# Patient Record
Sex: Male | Born: 1955
Health system: Southern US, Community
[De-identification: ages and names within clinical notes are randomized; demographics above are authoritative.]

## PROBLEM LIST (undated history)

## (undated) DIAGNOSIS — F528 Other sexual dysfunction not due to a substance or known physiological condition: Secondary | ICD-10-CM

## (undated) DIAGNOSIS — K449 Diaphragmatic hernia without obstruction or gangrene: Secondary | ICD-10-CM

## (undated) DIAGNOSIS — I509 Heart failure, unspecified: Secondary | ICD-10-CM

## (undated) DIAGNOSIS — I340 Nonrheumatic mitral (valve) insufficiency: Secondary | ICD-10-CM

## (undated) DIAGNOSIS — I499 Cardiac arrhythmia, unspecified: Secondary | ICD-10-CM

## (undated) DIAGNOSIS — C9201 Acute myeloblastic leukemia, in remission: Secondary | ICD-10-CM

## (undated) DIAGNOSIS — I10 Essential (primary) hypertension: Secondary | ICD-10-CM

## (undated) DIAGNOSIS — F32A Depression, unspecified: Secondary | ICD-10-CM

## (undated) DIAGNOSIS — I428 Other cardiomyopathies: Secondary | ICD-10-CM

## (undated) DIAGNOSIS — M1990 Unspecified osteoarthritis, unspecified site: Secondary | ICD-10-CM

## (undated) DIAGNOSIS — E785 Hyperlipidemia, unspecified: Secondary | ICD-10-CM

## (undated) DIAGNOSIS — I2510 Atherosclerotic heart disease of native coronary artery without angina pectoris: Secondary | ICD-10-CM

## (undated) DIAGNOSIS — Z87442 Personal history of urinary calculi: Secondary | ICD-10-CM

## (undated) DIAGNOSIS — Z8371 Family history of colonic polyps: Secondary | ICD-10-CM

## (undated) DIAGNOSIS — K5730 Diverticulosis of large intestine without perforation or abscess without bleeding: Secondary | ICD-10-CM

## (undated) DIAGNOSIS — D469 Myelodysplastic syndrome, unspecified: Secondary | ICD-10-CM

## (undated) DIAGNOSIS — D649 Anemia, unspecified: Secondary | ICD-10-CM

## (undated) DIAGNOSIS — F329 Major depressive disorder, single episode, unspecified: Secondary | ICD-10-CM

## (undated) DIAGNOSIS — K219 Gastro-esophageal reflux disease without esophagitis: Secondary | ICD-10-CM

## (undated) HISTORY — PX: KNEE SURGERY: SHX244

## (undated) HISTORY — DX: Acute myeloblastic leukemia, in remission: C92.01

## (undated) HISTORY — DX: Depression, unspecified: F32.A

## (undated) HISTORY — DX: Unspecified osteoarthritis, unspecified site: M19.90

## (undated) HISTORY — DX: Hyperlipidemia, unspecified: E78.5

## (undated) HISTORY — DX: Other sexual dysfunction not due to a substance or known physiological condition: F52.8

## (undated) HISTORY — DX: Gastro-esophageal reflux disease without esophagitis: K21.9

## (undated) HISTORY — DX: Diverticulosis of large intestine without perforation or abscess without bleeding: K57.30

## (undated) HISTORY — DX: Diaphragmatic hernia without obstruction or gangrene: K44.9

## (undated) HISTORY — PX: SHOULDER SURGERY: SHX246

## (undated) HISTORY — DX: Myelodysplastic syndrome, unspecified: D46.9

## (undated) HISTORY — DX: Anemia, unspecified: D64.9

## (undated) HISTORY — DX: Personal history of urinary calculi: Z87.442

## (undated) HISTORY — DX: Essential (primary) hypertension: I10

## (undated) HISTORY — DX: Major depressive disorder, single episode, unspecified: F32.9

## (undated) HISTORY — DX: Family history of colonic polyps: Z83.71

---

## 1997-08-11 DIAGNOSIS — C9201 Acute myeloblastic leukemia, in remission: Secondary | ICD-10-CM

## 1997-08-11 HISTORY — DX: Acute myeloblastic leukemia, in remission: C92.01

## 1997-12-27 ENCOUNTER — Encounter: Payer: Self-pay | Admitting: Internal Medicine

## 1998-03-16 ENCOUNTER — Encounter: Payer: Self-pay | Admitting: Internal Medicine

## 1998-03-16 ENCOUNTER — Other Ambulatory Visit: Admission: RE | Admit: 1998-03-16 | Discharge: 1998-03-16 | Payer: Self-pay | Admitting: Gastroenterology

## 2000-09-30 ENCOUNTER — Ambulatory Visit (HOSPITAL_COMMUNITY): Admission: RE | Admit: 2000-09-30 | Discharge: 2000-09-30 | Payer: Self-pay | Admitting: Orthopedic Surgery

## 2000-09-30 ENCOUNTER — Encounter: Payer: Self-pay | Admitting: Orthopedic Surgery

## 2003-08-02 ENCOUNTER — Encounter: Payer: Self-pay | Admitting: Internal Medicine

## 2004-07-10 ENCOUNTER — Ambulatory Visit: Payer: Self-pay | Admitting: Internal Medicine

## 2004-08-01 ENCOUNTER — Ambulatory Visit: Payer: Self-pay | Admitting: Internal Medicine

## 2004-11-25 ENCOUNTER — Ambulatory Visit: Payer: Self-pay | Admitting: Internal Medicine

## 2005-01-17 ENCOUNTER — Encounter: Admission: RE | Admit: 2005-01-17 | Discharge: 2005-01-17 | Payer: Self-pay | Admitting: Orthopedic Surgery

## 2005-01-23 ENCOUNTER — Ambulatory Visit: Payer: Self-pay | Admitting: Internal Medicine

## 2005-01-30 ENCOUNTER — Ambulatory Visit: Payer: Self-pay | Admitting: Internal Medicine

## 2005-06-06 ENCOUNTER — Ambulatory Visit: Payer: Self-pay | Admitting: Internal Medicine

## 2005-07-28 ENCOUNTER — Ambulatory Visit: Payer: Self-pay | Admitting: Internal Medicine

## 2005-08-06 ENCOUNTER — Ambulatory Visit: Payer: Self-pay | Admitting: Internal Medicine

## 2005-08-25 ENCOUNTER — Ambulatory Visit: Payer: Self-pay | Admitting: Internal Medicine

## 2005-08-29 ENCOUNTER — Ambulatory Visit: Payer: Self-pay | Admitting: Gastroenterology

## 2005-09-15 ENCOUNTER — Encounter: Payer: Self-pay | Admitting: Internal Medicine

## 2005-09-15 ENCOUNTER — Ambulatory Visit: Payer: Self-pay | Admitting: Gastroenterology

## 2005-12-15 ENCOUNTER — Ambulatory Visit: Payer: Self-pay | Admitting: Internal Medicine

## 2006-06-18 ENCOUNTER — Ambulatory Visit: Payer: Self-pay | Admitting: Internal Medicine

## 2006-07-30 ENCOUNTER — Ambulatory Visit: Payer: Self-pay | Admitting: Internal Medicine

## 2006-08-17 ENCOUNTER — Ambulatory Visit: Payer: Self-pay | Admitting: Internal Medicine

## 2006-08-17 LAB — CONVERTED CEMR LAB
ALT: 58 units/L — ABNORMAL HIGH (ref 0–40)
AST: 35 units/L (ref 0–37)
Albumin: 4.1 g/dL (ref 3.5–5.2)
Alkaline Phosphatase: 57 units/L (ref 39–117)
BUN: 32 mg/dL — ABNORMAL HIGH (ref 6–23)
Basophils Absolute: 0 10*3/uL (ref 0.0–0.1)
Basophils Relative: 0.6 % (ref 0.0–1.0)
CO2: 31 meq/L (ref 19–32)
Calcium: 9.8 mg/dL (ref 8.4–10.5)
Chloride: 105 meq/L (ref 96–112)
Chol/HDL Ratio, serum: 4.2
Cholesterol: 225 mg/dL (ref 0–200)
Creatinine, Ser: 1.2 mg/dL (ref 0.4–1.5)
Eosinophil percent: 4.7 % (ref 0.0–5.0)
GFR calc non Af Amer: 68 mL/min
Glomerular Filtration Rate, Af Am: 82 mL/min/{1.73_m2}
Glucose, Bld: 143 mg/dL — ABNORMAL HIGH (ref 70–99)
HCT: 39.3 % (ref 39.0–52.0)
HDL: 54 mg/dL (ref >39.0)
Hemoglobin: 13.6 g/dL (ref 13.0–17.0)
Hgb A1c MFr Bld: 5.7 % (ref 4.6–6.0)
LDL DIRECT: 141.9 mg/dL
Lymphocytes Relative: 19.2 % (ref 12.0–46.0)
MCHC: 34.5 g/dL (ref 30.0–36.0)
MCV: 98.3 fL (ref 78.0–100.0)
Monocytes Absolute: 0.7 10*3/uL (ref 0.2–0.7)
Monocytes Relative: 13.2 % — ABNORMAL HIGH (ref 3.0–11.0)
Neutro Abs: 3.4 10*3/uL (ref 1.4–7.7)
Neutrophils Relative %: 62.3 % (ref 43.0–77.0)
PSA: 0.72 ng/mL (ref 0.10–4.00)
Platelets: 361 10*3/uL (ref 150–400)
Potassium: 3.8 meq/L (ref 3.5–5.1)
RBC: 4 M/uL — ABNORMAL LOW (ref 4.22–5.81)
RDW: 12.3 % (ref 11.5–14.6)
Sodium: 142 meq/L (ref 135–145)
TSH: 1.72 microintl units/mL (ref 0.35–5.50)
Total Bilirubin: 1.1 mg/dL (ref 0.3–1.2)
Total Protein: 6.8 g/dL (ref 6.0–8.3)
Triglyceride fasting, serum: 126 mg/dL (ref 0–149)
VLDL: 25 mg/dL (ref 0–40)
WBC: 5.3 10*3/uL (ref 4.5–10.5)

## 2006-08-24 ENCOUNTER — Ambulatory Visit: Payer: Self-pay | Admitting: Internal Medicine

## 2006-10-26 ENCOUNTER — Ambulatory Visit: Payer: Self-pay | Admitting: Internal Medicine

## 2006-11-17 ENCOUNTER — Ambulatory Visit: Payer: Self-pay | Admitting: Internal Medicine

## 2006-11-17 LAB — CONVERTED CEMR LAB
BUN: 16 mg/dL (ref 6–23)
CO2: 32 meq/L (ref 19–32)
Calcium: 9.5 mg/dL (ref 8.4–10.5)
Chloride: 108 meq/L (ref 96–112)
Creatinine, Ser: 1 mg/dL (ref 0.4–1.5)
GFR calc Af Amer: 101 mL/min
GFR calc non Af Amer: 84 mL/min
Glucose, Bld: 140 mg/dL — ABNORMAL HIGH (ref 70–99)
Hgb A1c MFr Bld: 5.8 % (ref 4.6–6.0)
Potassium: 4.3 meq/L (ref 3.5–5.1)
Sodium: 145 meq/L (ref 135–145)

## 2006-11-24 ENCOUNTER — Ambulatory Visit: Payer: Self-pay | Admitting: Internal Medicine

## 2006-12-16 ENCOUNTER — Ambulatory Visit: Payer: Self-pay | Admitting: Internal Medicine

## 2006-12-16 ENCOUNTER — Encounter: Admission: RE | Admit: 2006-12-16 | Discharge: 2006-12-16 | Payer: Self-pay | Admitting: Internal Medicine

## 2006-12-16 LAB — CONVERTED CEMR LAB
ALT: 40 units/L (ref 0–40)
AST: 32 units/L (ref 0–37)
Albumin: 3.9 g/dL (ref 3.5–5.2)
Alkaline Phosphatase: 43 units/L (ref 39–117)
BUN: 23 mg/dL (ref 6–23)
Basophils Absolute: 0 10*3/uL (ref 0.0–0.1)
Basophils Relative: 0.3 % (ref 0.0–1.0)
Bilirubin, Direct: 0.1 mg/dL (ref 0.0–0.3)
CO2: 30 meq/L (ref 19–32)
Calcium: 9.2 mg/dL (ref 8.4–10.5)
Chloride: 107 meq/L (ref 96–112)
Cholesterol: 172 mg/dL (ref 0–200)
Creatinine, Ser: 1.1 mg/dL (ref 0.4–1.5)
Eosinophils Absolute: 0 10*3/uL (ref 0.0–0.6)
Eosinophils Relative: 0.3 % (ref 0.0–5.0)
GFR calc Af Amer: 91 mL/min
GFR calc non Af Amer: 75 mL/min
Glucose, Bld: 109 mg/dL — ABNORMAL HIGH (ref 70–99)
HCT: 40.3 % (ref 39.0–52.0)
HDL: 47 mg/dL (ref >39.0)
Hemoglobin: 14.2 g/dL (ref 13.0–17.0)
LDL Cholesterol: 108 mg/dL — ABNORMAL HIGH (ref 0–99)
Lymphocytes Relative: 7.4 % — ABNORMAL LOW (ref 12.0–46.0)
MCHC: 35.4 g/dL (ref 30.0–36.0)
MCV: 95.9 fL (ref 78.0–100.0)
Monocytes Absolute: 0.5 10*3/uL (ref 0.2–0.7)
Monocytes Relative: 7.6 % (ref 3.0–11.0)
Neutro Abs: 5.1 10*3/uL (ref 1.4–7.7)
Neutrophils Relative %: 84.4 % — ABNORMAL HIGH (ref 43.0–77.0)
Platelets: 269 10*3/uL (ref 150–400)
Potassium: 3.5 meq/L (ref 3.5–5.1)
RBC: 4.2 M/uL — ABNORMAL LOW (ref 4.22–5.81)
RDW: 12.7 % (ref 11.5–14.6)
Sodium: 142 meq/L (ref 135–145)
TSH: 1.46 microintl units/mL (ref 0.35–5.50)
Total Bilirubin: 0.8 mg/dL (ref 0.3–1.2)
Total CHOL/HDL Ratio: 3.7
Total Protein: 6.1 g/dL (ref 6.0–8.3)
Triglycerides: 87 mg/dL (ref 0–149)
VLDL: 17 mg/dL (ref 0–40)
WBC: 6.1 10*3/uL (ref 4.5–10.5)

## 2007-05-26 ENCOUNTER — Ambulatory Visit: Payer: Self-pay | Admitting: Internal Medicine

## 2007-05-26 DIAGNOSIS — E785 Hyperlipidemia, unspecified: Secondary | ICD-10-CM | POA: Insufficient documentation

## 2007-05-26 DIAGNOSIS — R7301 Impaired fasting glucose: Secondary | ICD-10-CM | POA: Insufficient documentation

## 2007-05-26 LAB — CONVERTED CEMR LAB
BUN: 15 mg/dL (ref 6–23)
CO2: 30 meq/L (ref 19–32)
Calcium: 9.4 mg/dL (ref 8.4–10.5)
Chloride: 104 meq/L (ref 96–112)
Cholesterol: 212 mg/dL (ref 0–200)
Creatinine, Ser: 1.1 mg/dL (ref 0.4–1.5)
Direct LDL: 141.3 mg/dL
GFR calc Af Amer: 91 mL/min
GFR calc non Af Amer: 75 mL/min
Glucose, Bld: 111 mg/dL — ABNORMAL HIGH (ref 70–99)
HDL: 47.1 mg/dL (ref >39.0)
Hgb A1c MFr Bld: 5.7 % (ref 4.6–6.0)
PSA: 0.71 ng/mL (ref 0.10–4.00)
Potassium: 4 meq/L (ref 3.5–5.1)
Sodium: 140 meq/L (ref 135–145)
Total CHOL/HDL Ratio: 4.5
Triglycerides: 110 mg/dL (ref 0–149)
VLDL: 22 mg/dL (ref 0–40)

## 2007-06-01 ENCOUNTER — Ambulatory Visit: Payer: Self-pay | Admitting: Internal Medicine

## 2007-06-01 DIAGNOSIS — I10 Essential (primary) hypertension: Secondary | ICD-10-CM | POA: Insufficient documentation

## 2007-09-14 ENCOUNTER — Ambulatory Visit: Payer: Self-pay | Admitting: Internal Medicine

## 2007-09-17 ENCOUNTER — Telehealth: Payer: Self-pay | Admitting: Internal Medicine

## 2007-10-21 ENCOUNTER — Ambulatory Visit: Payer: Self-pay | Admitting: Internal Medicine

## 2007-10-21 LAB — CONVERTED CEMR LAB
ALT: 33 units/L (ref 0–53)
AST: 34 units/L (ref 0–37)
Alkaline Phosphatase: 55 units/L (ref 39–117)
BUN: 16 mg/dL (ref 6–23)
Basophils Relative: 0.7 % (ref 0.0–1.0)
Bilirubin, Direct: 0.2 mg/dL (ref 0.0–0.3)
CO2: 27 meq/L (ref 19–32)
Calcium: 9.2 mg/dL (ref 8.4–10.5)
Chloride: 107 meq/L (ref 96–112)
Creatinine, Ser: 1.2 mg/dL (ref 0.4–1.5)
Eosinophils Relative: 4.2 % (ref 0.0–5.0)
Glucose, Bld: 114 mg/dL — ABNORMAL HIGH (ref 70–99)
Glucose, Urine, Semiquant: NEGATIVE
Ketones, urine, test strip: NEGATIVE
LDL Cholesterol: 134 mg/dL — ABNORMAL HIGH (ref 0–99)
Lymphocytes Relative: 27.2 % (ref 12.0–46.0)
Nitrite: NEGATIVE
Platelets: 322 10*3/uL (ref 150–400)
RBC: 4.07 M/uL — ABNORMAL LOW (ref 4.22–5.81)
RDW: 11.5 % (ref 11.5–14.6)
Total Protein: 6 g/dL (ref 6.0–8.3)
Triglycerides: 67 mg/dL (ref 0–149)
Urobilinogen, UA: 0.2
VLDL: 13 mg/dL (ref 0–40)
WBC Urine, dipstick: NEGATIVE
WBC: 4.3 10*3/uL — ABNORMAL LOW (ref 4.5–10.5)

## 2007-11-18 ENCOUNTER — Ambulatory Visit: Payer: Self-pay | Admitting: Internal Medicine

## 2007-11-20 ENCOUNTER — Ambulatory Visit: Payer: Self-pay | Admitting: Family Medicine

## 2007-12-02 ENCOUNTER — Ambulatory Visit: Payer: Self-pay | Admitting: Internal Medicine

## 2007-12-28 ENCOUNTER — Encounter: Payer: Self-pay | Admitting: Internal Medicine

## 2008-01-04 ENCOUNTER — Telehealth: Payer: Self-pay | Admitting: Gastroenterology

## 2008-01-04 ENCOUNTER — Telehealth: Payer: Self-pay | Admitting: Internal Medicine

## 2008-05-18 ENCOUNTER — Ambulatory Visit: Payer: Self-pay | Admitting: Internal Medicine

## 2008-08-01 ENCOUNTER — Encounter: Payer: Self-pay | Admitting: Internal Medicine

## 2008-08-31 ENCOUNTER — Encounter: Payer: Self-pay | Admitting: Internal Medicine

## 2008-11-24 ENCOUNTER — Ambulatory Visit: Payer: Self-pay | Admitting: Family Medicine

## 2009-01-09 ENCOUNTER — Encounter: Payer: Self-pay | Admitting: Internal Medicine

## 2009-01-24 ENCOUNTER — Ambulatory Visit: Payer: Self-pay | Admitting: Internal Medicine

## 2009-01-24 LAB — CONVERTED CEMR LAB
Alkaline Phosphatase: 58 units/L (ref 39–117)
BUN: 26 mg/dL — ABNORMAL HIGH (ref 6–23)
Basophils Relative: 0.9 % (ref 0.0–3.0)
Bilirubin Urine: NEGATIVE
Bilirubin, Direct: 0.1 mg/dL (ref 0.0–0.3)
Chloride: 102 meq/L (ref 96–112)
Cholesterol: 187 mg/dL (ref 0–200)
Eosinophils Absolute: 0.2 10*3/uL (ref 0.0–0.7)
GFR calc non Af Amer: 67.19 mL/min (ref >60)
Glucose, Bld: 120 mg/dL — ABNORMAL HIGH (ref 70–99)
HDL: 48.2 mg/dL (ref >39.00)
LDL Cholesterol: 121 mg/dL — ABNORMAL HIGH (ref 0–99)
Leukocytes, UA: NEGATIVE
Lymphocytes Relative: 22 % (ref 12.0–46.0)
MCHC: 35.1 g/dL (ref 30.0–36.0)
MCV: 97.9 fL (ref 78.0–100.0)
Monocytes Absolute: 0.6 10*3/uL (ref 0.1–1.0)
Neutrophils Relative %: 61.4 % (ref 43.0–77.0)
Nitrite: NEGATIVE
PSA: 1.06 ng/mL (ref 0.10–4.00)
Platelets: 346 10*3/uL (ref 150.0–400.0)
Potassium: 4 meq/L (ref 3.5–5.1)
RBC: 3.92 M/uL — ABNORMAL LOW (ref 4.22–5.81)
Sodium: 143 meq/L (ref 135–145)
Specific Gravity, Urine: 1.025 (ref 1.000–1.030)
Total Bilirubin: 0.7 mg/dL (ref 0.3–1.2)
Total Protein, Urine: NEGATIVE mg/dL
VLDL: 18.2 mg/dL (ref 0.0–40.0)
WBC: 5 10*3/uL (ref 4.5–10.5)
pH: 6 (ref 5.0–8.0)

## 2009-01-31 ENCOUNTER — Ambulatory Visit: Payer: Self-pay | Admitting: Internal Medicine

## 2009-06-06 ENCOUNTER — Ambulatory Visit: Payer: Self-pay | Admitting: Family Medicine

## 2009-07-02 ENCOUNTER — Ambulatory Visit: Payer: Self-pay | Admitting: Internal Medicine

## 2009-07-17 ENCOUNTER — Telehealth: Payer: Self-pay | Admitting: Internal Medicine

## 2009-11-15 ENCOUNTER — Ambulatory Visit: Payer: Self-pay | Admitting: Family Medicine

## 2009-11-15 DIAGNOSIS — F528 Other sexual dysfunction not due to a substance or known physiological condition: Secondary | ICD-10-CM | POA: Insufficient documentation

## 2009-11-15 DIAGNOSIS — N529 Male erectile dysfunction, unspecified: Secondary | ICD-10-CM | POA: Insufficient documentation

## 2009-11-19 ENCOUNTER — Ambulatory Visit: Payer: Self-pay | Admitting: Internal Medicine

## 2009-11-27 ENCOUNTER — Ambulatory Visit: Payer: Self-pay | Admitting: Internal Medicine

## 2009-11-28 LAB — CONVERTED CEMR LAB
ALT: 20 units/L (ref 0–53)
AST: 17 units/L (ref 0–37)
Albumin: 4.1 g/dL (ref 3.5–5.2)
Alkaline Phosphatase: 55 units/L (ref 39–117)
Basophils Absolute: 0 10*3/uL (ref 0.0–0.1)
CO2: 32 meq/L (ref 19–32)
Calcium: 9.6 mg/dL (ref 8.4–10.5)
Creatinine, Ser: 1.2 mg/dL (ref 0.4–1.5)
Eosinophils Relative: 3 % (ref 0.0–5.0)
GFR calc non Af Amer: 66.98 mL/min (ref >60)
Glucose, Bld: 98 mg/dL (ref 70–99)
HCT: 40 % (ref 39.0–52.0)
Hemoglobin: 13.6 g/dL (ref 13.0–17.0)
Lymphocytes Relative: 16.2 % (ref 12.0–46.0)
Lymphs Abs: 1.7 10*3/uL (ref 0.7–4.0)
Monocytes Relative: 7.1 % (ref 3.0–12.0)
Neutro Abs: 7.7 10*3/uL (ref 1.4–7.7)
RBC: 4.14 M/uL — ABNORMAL LOW (ref 4.22–5.81)
RDW: 12.8 % (ref 11.5–14.6)
WBC: 10.5 10*3/uL (ref 4.5–10.5)

## 2009-12-03 ENCOUNTER — Ambulatory Visit: Payer: Self-pay | Admitting: Cardiovascular Disease

## 2010-01-15 ENCOUNTER — Encounter: Payer: Self-pay | Admitting: Internal Medicine

## 2010-01-29 ENCOUNTER — Ambulatory Visit: Payer: Self-pay | Admitting: Internal Medicine

## 2010-01-29 LAB — CONVERTED CEMR LAB
Albumin: 4.5 g/dL (ref 3.5–5.2)
Alkaline Phosphatase: 55 units/L (ref 39–117)
Basophils Relative: 0.3 % (ref 0.0–3.0)
Bilirubin Urine: NEGATIVE
CO2: 31 meq/L (ref 19–32)
Chloride: 104 meq/L (ref 96–112)
Direct LDL: 134.5 mg/dL
Eosinophils Absolute: 0.2 10*3/uL (ref 0.0–0.7)
HCT: 41.2 % (ref 39.0–52.0)
Hemoglobin: 14 g/dL (ref 13.0–17.0)
Lymphs Abs: 1.2 10*3/uL (ref 0.7–4.0)
MCHC: 34 g/dL (ref 30.0–36.0)
MCV: 98.6 fL (ref 78.0–100.0)
Monocytes Absolute: 0.6 10*3/uL (ref 0.1–1.0)
Neutro Abs: 3.3 10*3/uL (ref 1.4–7.7)
Nitrite: NEGATIVE
RBC: 4.18 M/uL — ABNORMAL LOW (ref 4.22–5.81)
Sodium: 143 meq/L (ref 135–145)
Specific Gravity, Urine: 1.02
Total CHOL/HDL Ratio: 3
Total Protein: 6.8 g/dL (ref 6.0–8.3)
Urobilinogen, UA: 0.2

## 2010-02-21 ENCOUNTER — Ambulatory Visit: Payer: Self-pay | Admitting: Internal Medicine

## 2010-07-17 ENCOUNTER — Telehealth: Payer: Self-pay | Admitting: Internal Medicine

## 2010-07-29 ENCOUNTER — Ambulatory Visit: Payer: Self-pay | Admitting: Internal Medicine

## 2010-07-29 DIAGNOSIS — J069 Acute upper respiratory infection, unspecified: Secondary | ICD-10-CM | POA: Insufficient documentation

## 2010-09-10 NOTE — Assessment & Plan Note (Signed)
Summary: CPX // RS/pt rescd//ccm   Vital Signs:  Patient profile:   55 year old male Height:      69.75 inches (177.16 cm) Weight:      182 pounds (82.73 kg) Temp:     98.2 degrees F (36.78 degrees C) oral Pulse rate:   79 / minute BP sitting:   116 / 74  (left arm) Cuff size:   regular  Vitals Entered By: Josph Macho RMA (February 21, 2010 8:53 AM) CC: Physical/ CF Is Patient Diabetic? No   CC:  Physical/ CF.  History of Present Illness: CPX  Current Problems (verified): 1)  Erectile Dysfunction, Non-organic  (ICD-302.72) 2)  Acute Myeloid Leukemia in Remission- S/p Autologous Bmt  (ICD-205.01) 3)  Physical Examination  (ICD-V70.0) 4)  Hypertension  (ICD-401) 5)  Hyperlipidemia  (ICD-272.4) 6)  Impaired Fasting Glucose  (ICD-790.21)  Current Medications (verified): 1)  Cialis 20 Mg Tabs (Tadalafil) .... Take 1 Tablet By Mouth Three Times A Week As Needed --Alternates With Viagra 2)  Fluticasone Propionate 50 Mcg/act Susp (Fluticasone Propionate) .... Spray 1 Puff Into Both Nostrils Once A Day Prn 3)  Valtrex 1 Gm Tabs (Valacyclovir Hcl) .... Take 1 Tablet By Mouth Once A Day As Needed 4)  Zoloft 100 Mg Tabs (Sertraline Hcl) .... Take 1/2 Tablet By Mouth Once A Day 5)  Amlodipine Besylate 10 Mg  Tabs (Amlodipine Besylate) .... Take 1 Tablet By Mouth Once A Day 6)  Viagra 50 Mg  Tabs (Sildenafil Citrate) .... 1/2 As Needed--Alternate With Cialis 7)  Prilosec Otc 20 Mg  Tbec (Omeprazole Magnesium) .... Once Daily 8)  Juice Plus Fibre   Liqd (Nutritional Supplements) .... Once Daily 9)  Gnp Saw Palmetto 160 Mg  Caps (Saw Palmetto (Serenoa Repens)) .... Once Daily 10)  B-12 500 Mcg  Tabs (Cyanocobalamin) .... Once Daily 11)  Hydromet 5-1.5 Mg/66ml Syrp (Hydrocodone-Homatropine) .Marland Kitchen.. 1-2 Tsp Q4h As Needed Cough  Allergies (verified): No Known Drug Allergies  Past History:  Past Medical History: Last updated: 01/31/2009 ANLL-s/p bone marrow transplant ANEMIA---cause by  AML GERD  Past Surgical History: Last updated: 02/11/2007 Shoulder sx Colonoscopy-09/15/2005  Family History: Last updated: 02/21/2010 Family History Hypertension Family History of Cardiovascular disorder father 37 yo, heart disease in 16s, prostate CA--dx in his late 74s sister---? hyperglycemia  Social History: Last updated: 02/21/2010 Married Never Smoked Regular exercise-yes2 kids in college. 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 hyperglycemia  Social History: Married Never Smoked Regular exercise-yes2 kids in college. owns own executive recruitment firm  Physical Exam  General:  alert and well-developed.   Head:  normocephalic and atraumatic.   Eyes:  pupils equal and pupils round.   Ears:  R ear normal and L ear normal.   Neck:  No deformities, masses, or tenderness noted. Chest Wall:  no deformities and no tenderness.   Lungs:  normal respiratory effort and no intercostal retractions.   Heart:  normal rate and regular rhythm.   Abdomen:  Bowel sounds positive,abdomen soft and non-tender without masses, organomegaly or hernias noted. Rectal:  no external abnormalities and normal sphincter tone.   Prostate:  no nodules and no asymmetry.   Msk:  No deformity or scoliosis noted of thoracic or lumbar spine.   Neurologic:  cranial nerves II-XII  intact and gait normal.     Impression & Recommendations:  Problem # 1:  PHYSICAL EXAMINATION (ICD-V70.0)  health maint UTD answered questions related to cardiac ct advised regular exercise  Orders: Venipuncture (16010) TLB-CRP-High Sensitivity (C-Reactive Protein) (86140-FCRP) TLB-Ferritin (82728-FER)  Problem # 2:  HYPERTENSION (ICD-401)  His updated  medication list for this problem includes:    Amlodipine Besylate 10 Mg Tabs (Amlodipine besylate) .Marland Kitchen... Take 1 tablet by mouth once a day  Problem # 3:  HYPERLIPIDEMIA (ICD-272.4)  Labs Reviewed: SGOT: 22 (01/29/2010)   SGPT: 25 (01/29/2010)  Prior 10 Yr Risk Heart Disease: Not enough information (06/01/2007)   HDL:62.60 (01/29/2010), 48.20 (01/24/2009)  LDL:121 (01/24/2009), 134 (10/21/2007)  Chol:215 (01/29/2010), 187 (01/24/2009)  Trig:167.0 (01/29/2010), 91.0 (01/24/2009)  Problem # 4:  IMPAIRED FASTING GLUCOSE (ICD-790.21)  Orders: TLB-A1C / Hgb A1C (Glycohemoglobin) (83036-A1C)  Complete Medication List: 1)  Cialis 20 Mg Tabs (Tadalafil) .... Take 1 tablet by mouth three times a week as needed --alternates with viagra 2)  Fluticasone Propionate 50 Mcg/act Susp (Fluticasone propionate) .... Spray 1 puff into both nostrils once a day prn 3)  Valtrex 1 Gm Tabs (Valacyclovir hcl) .... Take 1 tablet by mouth once a day as needed 4)  Zoloft 100 Mg Tabs (Sertraline hcl) .... Take 1/2 tablet by mouth once a day 5)  Amlodipine Besylate 10 Mg Tabs (Amlodipine besylate) .... Take 1 tablet by mouth once a day 6)  Viagra 50 Mg Tabs (Sildenafil citrate) .... 1/2 as needed--alternate with cialis 7)  Prilosec Otc 20 Mg Tbec (Omeprazole magnesium) .... Once daily 8)  Juice Plus Fibre Liqd (Nutritional supplements) .... Once daily 9)  Gnp Saw Palmetto 160 Mg Caps (Saw palmetto (serenoa repens)) .... Once daily 10)  B-12 500 Mcg Tabs (cyanocobalamin)  .... Once daily 11)  Hydromet 5-1.5 Mg/10ml Syrp (Hydrocodone-homatropine) .Marland Kitchen.. 1-2 tsp q4h as needed cough 12)  Aspirin 81 Mg Tbec (Aspirin) .... One by mouth every day Prescriptions: VIAGRA 50 MG  TABS (SILDENAFIL CITRATE) 1/2 as needed--alternate with Cialis  #9 x 4   Entered and Authorized by:   Birdie Sons MD   Signed by:   Birdie Sons MD on 02/21/2010   Method used:   Electronically to        [facility] [phone]* (retail)       [address]       [place], [place]  [address]       Ph: [phone] or [phone]       Fax: [phone]   RxID:   [id number] AMLODIPINE BESYLATE 10 MG  TABS (AMLODIPINE BESYLATE) Take 1 tablet by mouth once a day  #90 Tablet x 3   Entered and Authorized by:   Birdie Sons MD   Signed by:   Birdie Sons MD on 02/21/2010   Method used:   Electronically to        [facility] [phone]* (retail)       [address]       [place], [place]  [address]       Ph: [phone] or [phone]       Fax: [phone]   RxID:   [id number] ZOLOFT 100 MG TABS (SERTRALINE HCL) Take 1/2 tablet by mouth once a day  #90 Tablet x 3   Entered and Authorized by:   Birdie Sons MD   Signed by:   Birdie Sons MD on 02/21/2010   Method used:   Electronically to  [facility] [phone]* (retail)       [address]       [place], [place]  [address]       Ph: [phone] or [phone]       Fax: [phone]   RxID:   [id number] VALTREX 1 GM TABS (VALACYCLOVIR HCL) Take 1 tablet by mouth once a day as needed  #30 Tablet x 2   Entered and Authorized by:   Birdie Sons MD   Signed by:   Birdie Sons MD on 02/21/2010   Method used:   Electronically to        [facility] [phone]* (retail)       [address]       [place], [place]  [address]       Ph: [phone] or [phone]       Fax: [phone]   RxID:   [id number] FLUTICASONE PROPIONATE 50 MCG/ACT SUSP (FLUTICASONE PROPIONATE) Spray 1 puff into both nostrils once a day prn  #3 vials x 3   Entered and Authorized by:   Birdie Sons MD   Signed by:   Birdie Sons MD on 02/21/2010   Method used:   Electronically to        [facility] [phone]* (retail)       [address]       [place], [place]  [address]       Ph: [phone] or [phone]       Fax: [phone]   RxID:   [id number]

## 2010-09-10 NOTE — Assessment & Plan Note (Signed)
Summary: ha/st/no energy ok per doc/njr   Vital Signs:  Patient profile:   55 year old male Weight:      180 pounds BMI:     26.11 Temp:     98.5 degrees F oral Pulse rate:   72 / minute Pulse rhythm:   regular Resp:     12 per minute BP sitting:   118 / 82  (left arm) Cuff size:   regular  Vitals Entered By: Gladis Riffle, RN (November 27, 2009 9:28 AM) CC: continues headaches and no energy--has completed prednisone on 04/26/10-also-c/o sore throat and jaw bone ache into ears Is Patient Diabetic? No   CC:  continues headaches and no energy--has completed prednisone on 04/26/10-also-c/o sore throat and jaw bone ache into ears.  History of Present Illness: comes in for recurrent URI sxs has had some ST for past 3 days and has had recurrent fatigue yesterday and this a.m. with headache---some better today has had some sinus drainage no fever , chills, sweats  All other systems reviewed and were negative   Preventive Screening-Counseling & Management  Alcohol-Tobacco     Smoking Status: never  Current Medications (verified): 1)  Cialis 20 Mg Tabs (Tadalafil) .... Take 1 Tablet By Mouth Three Times A Week As Needed --Alternates With Viagra 2)  Fluticasone Propionate 50 Mcg/act Susp (Fluticasone Propionate) .... Spray 1 Puff Into Both Nostrils Once A Day Prn 3)  Valtrex 1 Gm Tabs (Valacyclovir Hcl) .... Take 1 Tablet By Mouth Once A Day As Needed 4)  Zoloft 100 Mg Tabs (Sertraline Hcl) .... Take 1/2 Tablet By Mouth Once A Day 5)  Amlodipine Besylate 10 Mg  Tabs (Amlodipine Besylate) .... Take 1 Tablet By Mouth Once A Day 6)  Viagra 50 Mg  Tabs (Sildenafil Citrate) .... 1/2 As Needed--Alternate With Cialis 7)  Prilosec Otc 20 Mg  Tbec (Omeprazole Magnesium) .... Once Daily 8)  Juice Plus Fibre   Liqd (Nutritional Supplements) .... Once Daily 9)  [facility] Vitamin B-6 100 Mg  Tabs (Pyridoxine Hcl) .... Once Daily 10)  Gnp Saw Palmetto 160 Mg  Caps (Saw Palmetto (Serenoa Repens)) .... Once  Daily 11)  B-12 250 Mcg  Tabs (Cyanocobalamin) .... Once Daily 12)  Zithromax Z-Pak 250 Mg Tabs (Azithromycin) .... 2 Stat Then 1 Once Daily For Infetion 13)  Hydromet 5-1.5 Mg/30ml Syrp (Hydrocodone-Homatropine) .Marland Kitchen.. 1-2 Tsp Q4h As Needed Cough 14)  Prednisone 20 Mg Tabs (Prednisone) .... 2 By Mouth Once Daily For 2 Days and Then 1 By Mouth Once Daily For 2 Days and Then 1/2 By Mouth Once Daily For 2 Days  Allergies: No Known Drug Allergies  Past History:  Past Medical History: Last updated: 01/31/2009 ANLL-s/p bone marrow transplant ANEMIA---cause by AML GERD  Past Surgical History: Last updated: 02/11/2007 Shoulder sx Colonoscopy-09/15/2005  Family History: Last updated: 12/02/2007 Family History Hypertension Family History of Cardiovascular disorder sister---? hyperglycemia  Social History: Last updated: 06/01/2007 Married Never Smoked Regular exercise-yes2 kids in college.  Risk Factors: Exercise: yes (06/01/2007)  Risk Factors: Smoking Status: never (11/27/2009)  Physical Exam  General:  Well-developed,well-nourished,in no acute distress; alert,appropriate and cooperative throughout examination appears acutely ill Eyes:  pupils equal and pupils round.   Ears:  R ear normal and L ear normal.   Neck:  No deformities, masses, or tenderness noted. Chest Wall:  no deformities and no tenderness.   Lungs:  Normal respiratory effort, chest expands symmetrically. Lungs are clear to auscultation, no crackles or wheezes. Heart:  Normal rate and regular rhythm. S1 and S2 normal without gallop, murmur, click, rub or other extra sounds.   Impression & Recommendations:  Problem # 1:  URI (ICD-465.9) doubt bacteroial but he is clearly at risk (see below) His updated medication list for this problem includes:    Hydromet 5-1.5 Mg/72ml Syrp (Hydrocodone-homatropine) .Marland Kitchen... 1-2 tsp q4h as needed cough  Orders: Venipuncture (27253) TLB-CBC Platelet - w/Differential  (85025-CBCD) TLB-Hepatic/Liver Function Pnl (80076-HEPATIC) TLB-BMP (Basic Metabolic Panel-BMET) (80048-METABOL)  Problem # 2:  SINUSITIS, CHRONIC (ICD-473.9)  given duration of sxs and lack of response will check ct sinus (also in light of immunosuppression) His updated medication list for this problem includes:    Fluticasone Propionate 50 Mcg/act Susp (Fluticasone propionate) ..... Spray 1 puff into both nostrils once a day prn    Hydromet 5-1.5 Mg/13ml Syrp (Hydrocodone-homatropine) .Marland Kitchen... 1-2 tsp q4h as needed cough  Orders: Radiology Referral (Radiology)  Complete Medication List: 1)  Cialis 20 Mg Tabs (Tadalafil) .... Take 1 tablet by mouth three times a week as needed --alternates with viagra 2)  Fluticasone Propionate 50 Mcg/act Susp (Fluticasone propionate) .... Spray 1 puff into both nostrils once a day prn 3)  Valtrex 1 Gm Tabs (Valacyclovir hcl) .... Take 1 tablet by mouth once a day as needed 4)  Zoloft 100 Mg Tabs (Sertraline hcl) .... Take 1/2 tablet by mouth once a day 5)  Amlodipine Besylate 10 Mg Tabs (Amlodipine besylate) .... Take 1 tablet by mouth once a day 6)  Viagra 50 Mg Tabs (Sildenafil citrate) .... 1/2 as needed--alternate with cialis 7)  Prilosec Otc 20 Mg Tbec (Omeprazole magnesium) .... Once daily 8)  Juice Plus Fibre Liqd (Nutritional supplements) .... Once daily 9)  [facility] Vitamin B-6 100 Mg Tabs (Pyridoxine hcl) .... Once daily 10)  Gnp Saw Palmetto 160 Mg Caps (Saw palmetto (serenoa repens)) .... Once daily 11)  B-12 250 Mcg Tabs (Cyanocobalamin) .... Once daily 12)  Hydromet 5-1.5 Mg/26ml Syrp (Hydrocodone-homatropine) .Marland Kitchen.. 1-2 tsp q4h as needed cough 13)  Prednisone 20 Mg Tabs (Prednisone) .... 2 by mouth once daily for 2 days and then 1 by mouth once daily for 2 days and then 1/2 by mouth once daily for 2 days

## 2010-09-10 NOTE — Letter (Signed)
Summary: [facility]  [facility]   Imported By: Maryln Gottron 03/01/2010 12:28:35  _____________________________________________________________________  External Attachment:    Type:   Image     Comment:   External Document

## 2010-09-10 NOTE — Assessment & Plan Note (Signed)
Summary: cough/chest and head congestion/body aches/?fever/cjr   Vital Signs:  Patient profile:   55 year old male O2 Sat:      98 % Temp:     98.3 degrees F Pulse rate:   100 / minute BP sitting:   124 / 82  (left arm)  Vitals Entered By: Pura Spice, RN (November 15, 2009 11:41 AM) CC: laryngitis achy stuffy nose cough --green sputum  Is Patient Diabetic? No   History of Present Illness: This 55 year old white male who has had onset of upper infarct or infection pharyngitis over the past 2-3 days has been complaining of a difficulty with slurred sinus generalized aching that he knows productive cough of green yellow sputum. He relates a history of having acute mild leukemia and is in remission. Hypertension is under control Discussed problems with ED and refill his Cialis prescription as well as given samples of Viagra  Allergies (verified): No Known Drug Allergies  Past History:  Past Medical History: Last updated: 01/31/2009 ANLL-s/p bone marrow transplant ANEMIA---cause by AML GERD  Past Surgical History: Last updated: 02/11/2007 Shoulder sx Colonoscopy-09/15/2005  Social History: Last updated: 06/01/2007 Married Never Smoked Regular exercise-yes2 kids in college.  Risk Factors: Smoking Status: never (02/11/2007)  Review of Systems  The patient denies anorexia, fever, weight loss, weight gain, vision loss, decreased hearing, hoarseness, chest pain, syncope, dyspnea on exertion, peripheral edema, prolonged cough, headaches, hemoptysis, abdominal pain, melena, hematochezia, severe indigestion/heartburn, hematuria, incontinence, genital sores, muscle weakness, suspicious skin lesions, transient blindness, difficulty walking, depression, unusual weight change, abnormal bleeding, enlarged lymph nodes, angioedema, breast masses, and testicular masses.    Physical Exam  General:  Well-developed,well-nourished,in no acute distress; alert,appropriate and cooperative  throughout examination appears acutely ill Head:  Normocephalic and atraumatic without obvious abnormalities. No apparent alopecia or balding. Eyes:  No corneal or conjunctival inflammation noted. EOMI. Perrla. Funduscopic exam benign, without hemorrhages, exudates or papilledema. Vision grossly normal. Ears:  External ear exam shows no significant lesions or deformities.  Otoscopic examination reveals clear canals, tympanic membranes are intact bilaterally without bulging, retraction, inflammation or discharge. Hearing is grossly normal bilaterally. Nose:  nasal mucosa swollen,erythematous with yellow green drainage Mouth:  pharyngeal erythema.   Neck:  No deformities, masses, or tenderness noted. Lungs:  rhonchi bilaterally, greater on rightno dullness, no crackles, and no wheezes.   Heart:  Normal rate and regular rhythm. S1 and S2 normal without gallop, murmur, click, rub or other extra sounds. Abdomen:  Bowel sounds positive,abdomen soft and non-tender without masses, organomegaly or hernias noted.   Impression & Recommendations:  Problem # 1:  LARYNGITIS, ACUTE (ICD-464.00) Assessment New  Problem # 2:  ACUTE BRONCHITIS (ICD-466.0) Assessment: New  His updated medication list for this problem includes:    Zithromax Z-pak 250 Mg Tabs (Azithromycin) .Marland Kitchen... 2 stat then 1 once daily for infetion    Hydromet 5-1.5 Mg/33ml Syrp (Hydrocodone-homatropine) .Marland Kitchen... 1-2 tsp q4h as needed cough  Problem # 3:  URI (ICD-465.9) Assessment: New  His updated medication list for this problem includes:    Hydromet 5-1.5 Mg/48ml Syrp (Hydrocodone-homatropine) .Marland Kitchen... 1-2 tsp q4h as needed cough  Problem # 4:  ACUTE MYELOID LEUKEMIA IN REMISSION- S/P AUTOLOGOUS BMT (ICD-205.01) Assessment: Improved  Problem # 5:  ERECTILE DYSFUNCTION, NON-ORGANIC (ICD-302.72) Assessment: Improved  His updated medication list for this problem includes:    Cialis 20 Mg Tabs (Tadalafil) .Marland Kitchen... Take 1 tablet by mouth three  times a week as needed --alternates with viagra  Viagra 50 Mg Tabs (Sildenafil citrate) .Marland Kitchen... 1/2 as needed--alternate with cialis  Complete Medication List: 1)  Cialis 20 Mg Tabs (Tadalafil) .... Take 1 tablet by mouth three times a week as needed --alternates with viagra 2)  Fluticasone Propionate 50 Mcg/act Susp (Fluticasone propionate) .... Spray 1 puff into both nostrils once a day prn 3)  Valtrex 1 Gm Tabs (Valacyclovir hcl) .... Take 1 tablet by mouth once a day as needed 4)  Zoloft 100 Mg Tabs (Sertraline hcl) .... Take 1/2 tablet by mouth once a day 5)  Amlodipine Besylate 10 Mg Tabs (Amlodipine besylate) .... Take 1 tablet by mouth once a day 6)  Viagra 50 Mg Tabs (Sildenafil citrate) .... 1/2 as needed--alternate with cialis 7)  Prilosec Otc 20 Mg Tbec (Omeprazole magnesium) .... Once daily 8)  Juice Plus Fibre Liqd (Nutritional supplements) .... Once daily 9)  [facility] Vitamin B-6 100 Mg Tabs (Pyridoxine hcl) .... Once daily 10)  Gnp Saw Palmetto 160 Mg Caps (Saw palmetto (serenoa repens)) .... Once daily 11)  B-12 250 Mcg Tabs (Cyanocobalamin) .... Once daily 12)  Zithromax Z-pak 250 Mg Tabs (Azithromycin) .... 2 stat then 1 once daily for infetion 13)  Hydromet 5-1.5 Mg/24ml Syrp (Hydrocodone-homatropine) .Marland Kitchen.. 1-2 tsp q4h as needed cough  Patient Instructions: 1)  acute URI ewith laryngitis 2)  Beginning bronchitis 3)  Zpack for antibiotic 4)  continue quaifinisonDM 5)  Hydromet for cough use night and day and also will help with larygitis 6)  Stay well hydrated 7)  Minimize talking Prescriptions: HYDROMET 5-1.5 MG/5ML SYRP (HYDROCODONE-HOMATROPINE) 1-2 tsp q4h as needed cough  #240 cc x 1   Entered and Authorized by:   Judithann Sheen MD   Signed by:   Judithann Sheen MD on 11/15/2009   Method used:   Print then Give to Patient   RxID:   [id number] ZITHROMAX Z-PAK 250 MG TABS (AZITHROMYCIN) 2 stat then 1 once daily for infetion  #1 pkge x 0   Entered and  Authorized by:   Judithann Sheen MD   Signed by:   Judithann Sheen MD on 11/15/2009   Method used:   [facility] to        [facility] [phone][facility] ([facility]ail[facility]       [address]       [place], [place]  [address]       Ph: [phone] or [phone]       Fax: [phone]   RxID:   [id number]

## 2010-09-10 NOTE — Assessment & Plan Note (Signed)
Summary: fu on sinus inf/njr   Vital Signs:  Patient profile:   55 year old male Weight:      181 pounds BMI:     26.25 Temp:     97.9 degrees F oral Pulse rate:   72 / minute Pulse rhythm:   regular Resp:     12 per minute BP sitting:   122 / 84  (left arm) Cuff size:   regular  Vitals Entered By: Gladis Riffle, RN (November 19, 2009 11:54 AM) CC: continues cough and congestion, completes Zpak today--no energy, not resolved with cough Is Patient Diabetic? No   CC:  continues cough and congestion, completes Zpak today--no energy, and not resolved with cough.  History of Present Illness: see dr Charmian Muff note fever or chills no sweats  All other systems reviewed and were negative   Preventive Screening-Counseling & Management  Alcohol-Tobacco     Smoking Status: never  Current Problems (verified): 1)  Erectile Dysfunction, Non-organic  (ICD-302.72) 2)  Acute Myeloid Leukemia in Remission- S/p Autologous Bmt  (ICD-205.01) 3)  Physical Examination  (ICD-V70.0) 4)  Hypertension  (ICD-401) 5)  Hyperlipidemia  (ICD-272.4) 6)  Impaired Fasting Glucose  (ICD-790.21)  Current Medications (verified): 1)  Cialis 20 Mg Tabs (Tadalafil) .... Take 1 Tablet By Mouth Three Times A Week As Needed --Alternates With Viagra 2)  Fluticasone Propionate 50 Mcg/act Susp (Fluticasone Propionate) .... Spray 1 Puff Into Both Nostrils Once A Day Prn 3)  Valtrex 1 Gm Tabs (Valacyclovir Hcl) .... Take 1 Tablet By Mouth Once A Day As Needed 4)  Zoloft 100 Mg Tabs (Sertraline Hcl) .... Take 1/2 Tablet By Mouth Once A Day 5)  Amlodipine Besylate 10 Mg  Tabs (Amlodipine Besylate) .... Take 1 Tablet By Mouth Once A Day 6)  Viagra 50 Mg  Tabs (Sildenafil Citrate) .... 1/2 As Needed--Alternate With Cialis 7)  Prilosec Otc 20 Mg  Tbec (Omeprazole Magnesium) .... Once Daily 8)  Juice Plus Fibre   Liqd (Nutritional Supplements) .... Once Daily 9)  [facility] Vitamin B-6 100 Mg  Tabs (Pyridoxine Hcl) .... Once  Daily 10)  Gnp Saw Palmetto 160 Mg  Caps (Saw Palmetto (Serenoa Repens)) .... Once Daily 11)  B-12 250 Mcg  Tabs (Cyanocobalamin) .... Once Daily 12)  Hydromet 5-1.5 Mg/31ml Syrp (Hydrocodone-Homatropine) .Marland Kitchen.. 1-2 Tsp Q4h As Needed Cough  Allergies (verified): No Known Drug Allergies  Physical Exam  General:  Well-developed,well-nourished,in no acute distress; alert,appropriate and cooperative throughout examination appears acutely ill Head:  normocephalic and atraumatic.   Eyes:  pupils equal and pupils round.   Ears:  R ear normal and L ear normal.   Neck:  No deformities, masses, or tenderness noted. Lungs:  normal respiratory effort and no intercostal retractions.   Heart:  normal rate and regular rhythm.     Impression & Recommendations:  Problem # 1:  URI (ICD-465.9) no evidence of bacterial infection. call for any concerns, increased sxs, fever, persistence of sxs, wheeze, SOB.  trial prednisone side effects discussed His updated medication list for this problem includes:    Hydromet 5-1.5 Mg/20ml Syrp (Hydrocodone-homatropine) .Marland Kitchen... 1-2 tsp q4h as needed cough  Complete Medication List: 1)  Cialis 20 Mg Tabs (Tadalafil) .... Take 1 tablet by mouth three times a week as needed --alternates with viagra 2)  Fluticasone Propionate 50 Mcg/act Susp (Fluticasone propionate) .... Spray 1 puff into both nostrils once a day prn 3)  Valtrex 1 Gm Tabs (Valacyclovir hcl) .... Take 1 tablet by  mouth once a day as needed 4)  Zoloft 100 Mg Tabs (Sertraline hcl) .... Take 1/2 tablet by mouth once a day 5)  Amlodipine Besylate 10 Mg Tabs (Amlodipine besylate) .... Take 1 tablet by mouth once a day 6)  Viagra 50 Mg Tabs (Sildenafil citrate) .... 1/2 as needed--alternate with cialis 7)  Prilosec Otc 20 Mg Tbec (Omeprazole magnesium) .... Once daily 8)  Juice Plus Fibre Liqd (Nutritional supplements) .... Once daily 9)  [facility] Vitamin B-6 100 Mg Tabs (Pyridoxine hcl) .... Once daily 10)  Gnp Saw  Palmetto 160 Mg Caps (Saw palmetto (serenoa repens)) .... Once daily 11)  B-12 250 Mcg Tabs (Cyanocobalamin) .... Once daily 12)  Hydromet 5-1.5 Mg/9ml Syrp (Hydrocodone-homatropine) .Marland Kitchen.. 1-2 tsp q4h as needed cough 13)  Prednisone 20 Mg Tabs (Prednisone) .... 2 by mouth once daily for 2 days and then 1 by mouth once daily for 2 days and then 1/2 by mouth once daily for 2 days  Patient Instructions: 1)  . Prescriptions: PREDNISONE 20 MG TABS (PREDNISONE) 2 by mouth once daily for 2 days and then 1 by mouth once daily for 2 days and then 1/2 by mouth once daily for 2 days  #20 x 0   Entered and Authorized by:   Birdie Sons MD   Signed by:   Birdie Sons MD on 11/19/2009   Method used:   [facility] to        [facility] [phone][facility] (retail)       [address]       [place], [place]  [address]       Ph: [phone] or [phone]       Fax: [phone]   RxID:   [id number]

## 2010-09-10 NOTE — Progress Notes (Signed)
Summary: refill viagra  Phone Note Call from Patient Call back at Home Phone [phone]   Caller: Patient Call For: Birdie Sons MD Summary of Call: Wants a refill on Viagra, and would like to have a 100 mg. strength and take 1/2. [facility] [address]) Initial call taken by: Lynann Beaver CMA AAMA,  July 17, 2010 1:06 PM  Follow-up for Phone Call        ok 1/2-1 as needed #10/6 Follow-up by: Birdie Sons MD,  July 17, 2010 4:30 PM  Additional Follow-up for Phone Call Additional follow up Details #1::        Phone Call Completed, Rx Called In Additional Follow-up by: Alfred Levins, CMA,  July 17, 2010 4:36 PM    New/Updated Medications: VIAGRA 50 MG  TABS (SILDENAFIL CITRATE) 1/2-1 as needed--alternate with Cialis Prescriptions: VIAGRA 50 MG  TABS (SILDENAFIL CITRATE) 1/2-1 as needed--alternate with Cialis  #10 x 6   Entered by:   Alfred Levins, CMA   Authorized by:   Birdie Sons MD   Signed by:   Alfred Levins, CMA on 07/17/2010   Method used:   Electronically to        [phone][facility] (retail)       [address]       [place], [place]  [address]       Ph: [phone] or [phone]       Fax: [phone]   RxID:   [id number]

## 2010-09-12 NOTE — Assessment & Plan Note (Signed)
Summary: cough/chest congestion/fever/cjr   Vital Signs:  Patient profile:   55 year old male Weight:      182 pounds Temp:     98.4 degrees F oral BP sitting:   148 / 88  (left arm) Cuff size:   regular  Vitals Entered By: Alfred Levins, CMA (July 29, 2010 11:20 AM) CC: achy, fever, cough x3 wks   CC:  achy, fever, and cough x3 wks.  History of Present Illness: 3-4 week hx of intermittent aching, cough fever 101 yesterday night sweats cough--nonproductive  Current Medications (verified): 1)  Fluticasone Propionate 50 Mcg/act Susp (Fluticasone Propionate) .... Spray 1 Puff Into Both Nostrils Once A Day Prn 2)  Valtrex 1 Gm Tabs (Valacyclovir Hcl) .... Take 1 Tablet By Mouth Once A Day As Needed 3)  Zoloft 100 Mg Tabs (Sertraline Hcl) .... Take 1/2 Tablet By Mouth Once A Day 4)  Amlodipine Besylate 10 Mg  Tabs (Amlodipine Besylate) .... Take 1 Tablet By Mouth Once A Day 5)  Viagra 50 Mg  Tabs (Sildenafil Citrate) .... 1/2-1 As Needed--Alternate With Cialis 6)  Prilosec Otc 20 Mg  Tbec (Omeprazole Magnesium) .... Once Daily 7)  Juice Plus Fibre   Liqd (Nutritional Supplements) .... Once Daily 8)  Gnp Saw Palmetto 160 Mg  Caps (Saw Palmetto (Serenoa Repens)) .... Once Daily 9)  B-12 500 Mcg  Tabs (Cyanocobalamin) .... Once Daily  Allergies (verified): No Known Drug Allergies  Physical Exam  General:  well-developed male in no acute distress. HEENT exam atraumatic, normocephalic oropharynx is moist. No erythema.. Chest with bilateral rhonchi cardiac exam S1-S2 are regular.alert.     Impression & Recommendations:  Problem # 1:  URI (ICD-465.9) given the duration of his symptoms I think it's best to treat with antibiotic. I did tell him I can't tell the difference from some viral infection or whether he's got a bacterial infection. Given the duration best to treat with an antibiotic. He'll call me if his symptoms persist or worsen. He has a hydrocodone-containing cuffs or  comments okay for him to use at night. The following medications were removed from the medication list:    Hydromet 5-1.5 Mg/34ml Syrp (Hydrocodone-homatropine) .Marland Kitchen... 1-2 tsp q4h as needed cough    Aspirin 81 Mg Tbec (Aspirin) ..... One by mouth every day  Complete Medication List: 1)  Fluticasone Propionate 50 Mcg/act Susp (Fluticasone propionate) .... Spray 1 puff into both nostrils once a day prn 2)  Valtrex 1 Gm Tabs (Valacyclovir hcl) .... Take 1 tablet by mouth once a day as needed 3)  Zoloft 100 Mg Tabs (Sertraline hcl) .... Take 1/2 tablet by mouth once a day 4)  Amlodipine Besylate 10 Mg Tabs (Amlodipine besylate) .... Take 1 tablet by mouth once a day 5)  Viagra 50 Mg Tabs (Sildenafil citrate) .... 1/2-1 as needed--alternate with cialis 6)  Prilosec Otc 20 Mg Tbec (Omeprazole magnesium) .... Once daily 7)  Juice Plus Fibre Liqd (Nutritional supplements) .... Once daily 8)  Gnp Saw Palmetto 160 Mg Caps (Saw palmetto (serenoa repens)) .... Once daily 9)  B-12 500 Mcg Tabs (cyanocobalamin)  .... Once daily 10)  Doxycycline Hyclate 100 Mg Caps (Doxycycline hyclate) .... Take 1 tab twice a day Prescriptions: DOXYCYCLINE HYCLATE 100 MG CAPS (DOXYCYCLINE HYCLATE) Take 1 tab twice a day  #20 x 0   Entered and Authorized by:   Birdie Sons MD   Signed by:   Birdie Sons MD on 07/29/2010   Method used:  [facility] to        [phone][facility] (retail)       [address]       [place], [place]  [address]       Ph: [phone] or [phone]       Fax: [phone]   RxID:   [id number]    Orders Added: 1)  Est. Patient Level III [34742]

## 2010-10-07 ENCOUNTER — Other Ambulatory Visit: Payer: Self-pay | Admitting: Internal Medicine

## 2011-01-28 ENCOUNTER — Encounter: Payer: Self-pay | Admitting: Gastroenterology

## 2011-01-28 ENCOUNTER — Ambulatory Visit (INDEPENDENT_AMBULATORY_CARE_PROVIDER_SITE_OTHER): Payer: BC Managed Care – PPO | Admitting: Gastroenterology

## 2011-01-28 DIAGNOSIS — Z800 Family history of malignant neoplasm of digestive organs: Secondary | ICD-10-CM

## 2011-01-28 DIAGNOSIS — K219 Gastro-esophageal reflux disease without esophagitis: Secondary | ICD-10-CM

## 2011-01-28 MED ORDER — PEG-KCL-NACL-NASULF-NA ASC-C 100 G PO SOLR: 1.0000 | Freq: Once | ORAL | Status: DC

## 2011-01-28 NOTE — Patient Instructions (Signed)
Your procedure has been scheduled for 03/10/2011, please follow the seperate instructions.  Your prescription(s) have been sent to you pharmacy.

## 2011-01-28 NOTE — Progress Notes (Signed)
History of Present Illness:  This is a 55 year old Caucasian male status post chemotherapy and bone marrow transplant at [facility] in 1999. He is in remission and is followed by Orvan July at [facility] Department of Oncology. He is due for followup colonoscopy per her family history of colon polyps. He had a negative colonoscopy 5 years ago. Recent labs showed slight anemia with a hemoglobin of 13.8. Apparently his serum iron levels are elevated. I do not think he has been evaluated for genetic hemochromatosis. In any case, he denies current gastrointestinal issues, has regular bowel movements without melena or hematochezia, and denies upper gastrointestinal or hepatobiliary complaints. His appetite is good and his weight is been stable. He does take daily Prilosec for chronic GERD, and has been unable to discontinue this medication. Last endoscopic exam was 13 years ago. ,Apparently his mother has celiac disease.   I have reviewed this patient's present history, medical and surgical past history, allergies and medications.     ROS: The remainder of the 10 point ROS is negative.. you are rod of medications including Flonase, omega-3, saw palmetto, Norvasc, Zoloft for depression, Viagra when necessary and Valtrex thousand milligrams a day. He denies current cardiovascular, pulmonary, or systemic complaints. There is no history of hepatitis, pancreatitis, but he has had previous kidney stones.   Past Medical History  Diagnosis Date  . Anemia   . Esophageal reflux   . Diverticulosis of colon (without mention of hemorrhage)   . Family hx colonic polyps   . Hiatal hernia   . Psychosexual dysfunction with inhibited sexual excitement   . Hypertension   . Hyperlipemia   . Acute myeloid leukemia in remission 1999  . History of kidney stones   . Depression   . Arthritis    Past Surgical History  Procedure Date  . Shoulder surgery     x 3     reports that he has  never smoked. He has never used smokeless tobacco. He reports that he drinks alcohol. He reports that he does not use illicit drugs. family history includes Breast cancer in his mother; Cancer in his mother; Celiac disease in his mother; Colon polyps in his mother; Heart disease in his father; Hypertension in his father; and Prostate cancer in his father.  There is no history of Colon cancer. No Known Allergies     Physical Exam: General well developed well nourished patient in no acute distress, appearing their stated age Eyes PERRLA, no icterus, fundoscopic exam per opthamologist Skin no lesions noted Neck supple, no adenopathy, no thyroid enlargement, no tenderness Chest clear to percussion and auscultation Heart no significant murmurs, gallops or rubs noted Abdomen no hepatosplenomegaly masses or tenderness, BS normal.  Rectal inspection normal no fissures, or fistulae noted.  No masses or tenderness on digital exam. Stool guaiac negative. Neurologic patient oriented x 3, cranial nerves intact, no focal neurologic deficits noted. Psychological mental status normal and normal affect.  Assessment and plan:Interesting patient with previous acute myelogenous leukemia treated in 1999 with chemotherapy and peripheral blood stem cell transplant. He has chronic acid reflux and his PPI-dependent. He has a very interesting family history, and we have decided to followup his GI complaints with endoscopy and small bowel biopsy, colonoscopy with conscious sedation, and he probably needs hemochromatosis genetics studies. I reviewed anti-reflex regime with him and will continue daily Prilosec. The long-term side effects and risk of PPI therapy every and reviewed in detail. His depression seems well managed  on Zoloft , his hypertension appears well controlled on Norvasc, and he denies any hepatobiliary complaints. He Is to Continue His Primary Care Followup with Dr. Cato Mulligan, and oncology followup as scheduled.      Please copy her primary care physician, referring physician, and pertinent subspecialists. Encounter Diagnoses  Name Primary?  . Esophageal reflux   . Family history of malignant neoplasm of gastrointestinal tract

## 2011-01-29 ENCOUNTER — Encounter: Payer: Self-pay | Admitting: Gastroenterology

## 2011-02-10 ENCOUNTER — Other Ambulatory Visit: Payer: Self-pay | Admitting: Internal Medicine

## 2011-03-10 ENCOUNTER — Encounter: Payer: BC Managed Care – PPO | Admitting: Gastroenterology

## 2011-04-07 ENCOUNTER — Telehealth: Payer: Self-pay | Admitting: Internal Medicine

## 2011-04-07 MED ORDER — SILDENAFIL CITRATE 50 MG PO TABS: 25.0000 mg | ORAL_TABLET | Freq: Every day | ORAL | Status: DC | PRN

## 2011-04-07 NOTE — Telephone Encounter (Signed)
rx up front ready for p/u, pt aware 

## 2011-04-07 NOTE — Telephone Encounter (Signed)
Pt would like a written rx for  Viagra for mail order. Pt would like to pick up this rx. His cpx is in October.

## 2011-04-21 ENCOUNTER — Encounter: Payer: Self-pay | Admitting: Gastroenterology

## 2011-04-21 ENCOUNTER — Ambulatory Visit (AMBULATORY_SURGERY_CENTER): Payer: BC Managed Care – PPO | Admitting: Gastroenterology

## 2011-04-21 DIAGNOSIS — K5730 Diverticulosis of large intestine without perforation or abscess without bleeding: Secondary | ICD-10-CM

## 2011-04-21 DIAGNOSIS — D1330 Benign neoplasm of unspecified part of small intestine: Secondary | ICD-10-CM

## 2011-04-21 DIAGNOSIS — Z8371 Family history of colonic polyps: Secondary | ICD-10-CM | POA: Insufficient documentation

## 2011-04-21 DIAGNOSIS — K219 Gastro-esophageal reflux disease without esophagitis: Secondary | ICD-10-CM

## 2011-04-21 DIAGNOSIS — K449 Diaphragmatic hernia without obstruction or gangrene: Secondary | ICD-10-CM

## 2011-04-21 DIAGNOSIS — Z1211 Encounter for screening for malignant neoplasm of colon: Secondary | ICD-10-CM

## 2011-04-21 DIAGNOSIS — Z83719 Family history of colon polyps, unspecified: Secondary | ICD-10-CM | POA: Insufficient documentation

## 2011-04-21 HISTORY — DX: Diaphragmatic hernia without obstruction or gangrene: K44.9

## 2011-04-21 HISTORY — DX: Family history of colonic polyps: Z83.71

## 2011-04-21 HISTORY — DX: Diverticulosis of large intestine without perforation or abscess without bleeding: K57.30

## 2011-04-21 HISTORY — DX: Family history of colon polyps, unspecified: Z83.719

## 2011-04-21 MED ORDER — SODIUM CHLORIDE 0.9 % IV SOLN: 500.0000 mL | INTRAVENOUS | Status: DC

## 2011-04-21 NOTE — Patient Instructions (Signed)
Discharge instructions given with verbal understanding. Handouts on diverticulosis,hiatal hernia, and a high fiber diet given. Resume previous medications.

## 2011-04-22 ENCOUNTER — Telehealth: Payer: Self-pay | Admitting: *Deleted

## 2011-04-22 NOTE — Telephone Encounter (Signed)
Follow up Call- Patient questions:  Do you have a fever, pain , or abdominal swelling? no Pain Score  0 *  Have you tolerated food without any problems? yes  Have you been able to return to your normal activities? yes  Do you have any questions about your discharge instructions: Diet   no Medications  no Follow up visit  no  Do you have questions or concerns about your Care? yes  Actions: * If pain score is 4 or above: No action needed, pain <4.  Pt asked when the biopsy results would return. Educated pt that he will receive biopsy results in the mail in about two weeks.

## 2011-04-28 ENCOUNTER — Encounter: Payer: Self-pay | Admitting: Gastroenterology

## 2011-05-12 ENCOUNTER — Other Ambulatory Visit (INDEPENDENT_AMBULATORY_CARE_PROVIDER_SITE_OTHER): Payer: BC Managed Care – PPO

## 2011-05-12 DIAGNOSIS — Z0000 Encounter for general adult medical examination without abnormal findings: Secondary | ICD-10-CM

## 2011-05-12 LAB — POCT URINALYSIS DIPSTICK
Bilirubin, UA: NEGATIVE
Glucose, UA: NEGATIVE
Nitrite, UA: NEGATIVE
Urobilinogen, UA: 0.2

## 2011-05-12 LAB — BASIC METABOLIC PANEL
Calcium: 9.5 mg/dL (ref 8.4–10.5)
GFR: 69.28 mL/min (ref >60.00)
Glucose, Bld: 117 mg/dL — ABNORMAL HIGH (ref 70–99)
Potassium: 4.8 mEq/L (ref 3.5–5.1)
Sodium: 142 mEq/L (ref 135–145)

## 2011-05-12 LAB — CBC WITH DIFFERENTIAL/PLATELET
Basophils Absolute: 0 10*3/uL (ref 0.0–0.1)
Eosinophils Relative: 4.4 % (ref 0.0–5.0)
HCT: 42.5 % (ref 39.0–52.0)
Hemoglobin: 14.3 g/dL (ref 13.0–17.0)
Lymphocytes Relative: 25.7 % (ref 12.0–46.0)
Monocytes Relative: 14 % — ABNORMAL HIGH (ref 3.0–12.0)
Neutro Abs: 2.4 10*3/uL (ref 1.4–7.7)
RBC: 4.33 Mil/uL (ref 4.22–5.81)
RDW: 12.8 % (ref 11.5–14.6)
WBC: 4.4 10*3/uL — ABNORMAL LOW (ref 4.5–10.5)

## 2011-05-12 LAB — LIPID PANEL
Total CHOL/HDL Ratio: 4
VLDL: 14.8 mg/dL (ref 0.0–40.0)

## 2011-05-12 LAB — TSH: TSH: 1.92 u[IU]/mL (ref 0.35–5.50)

## 2011-05-12 LAB — HEPATIC FUNCTION PANEL
ALT: 28 U/L (ref 0–53)
AST: 27 U/L (ref 0–37)
Albumin: 4.6 g/dL (ref 3.5–5.2)
Alkaline Phosphatase: 61 U/L (ref 39–117)
Total Bilirubin: 0.6 mg/dL (ref 0.3–1.2)

## 2011-05-12 LAB — PSA: PSA: 0.87 ng/mL (ref 0.10–4.00)

## 2011-05-19 ENCOUNTER — Encounter: Payer: Self-pay | Admitting: Internal Medicine

## 2011-05-19 ENCOUNTER — Ambulatory Visit (INDEPENDENT_AMBULATORY_CARE_PROVIDER_SITE_OTHER): Payer: BC Managed Care – PPO | Admitting: Internal Medicine

## 2011-05-19 VITALS — BP 126/90 | HR 84 | Temp 98.0°F | Ht 70.0 in | Wt 180.0 lb

## 2011-05-19 DIAGNOSIS — Z0000 Encounter for general adult medical examination without abnormal findings: Secondary | ICD-10-CM

## 2011-05-19 DIAGNOSIS — Z23 Encounter for immunization: Secondary | ICD-10-CM

## 2011-05-19 NOTE — Progress Notes (Signed)
  Subjective:    Patient ID: [name], male    DOB: [birth year], 55 y.o.   MRN: [id number]  HPI  cpx  Past Medical History  Diagnosis Date  . Anemia   . Esophageal reflux   . Diverticulosis of colon (without mention of hemorrhage)   . Family hx colonic polyps   . Hiatal hernia   . Psychosexual dysfunction with inhibited sexual excitement   . Hypertension   . Hyperlipemia   . Acute myeloid leukemia in remission 1999  . History of kidney stones   . Depression   . Arthritis    Past Surgical History  Procedure Date  . Shoulder surgery     x 3     reports that he has never smoked. He has never used smokeless tobacco. He reports that he drinks alcohol. He reports that he does not use illicit drugs. family history includes Breast cancer in his mother; Cancer in his mother; Celiac disease in his mother; Colon polyps in his mother; Heart disease in his father; Hypertension in his father; and Prostate cancer in his father.  There is no history of Colon cancer. No Known Allergies   Review of Systems  patient denies chest pain, shortness of breath, orthopnea. Denies lower extremity edema, abdominal pain, change in appetite, change in bowel movements. Patient denies rashes, musculoskeletal complaints. No other specific complaints in a complete review of systems.      Objective:   Physical Exam Well-developed male in no acute distress. HEENT exam atraumatic, normocephalic, extraocular muscles are intact. Conjunctivae are pink without exudate. Neck is supple without lymphadenopathy, thyromegaly, jugular venous distention. Chest is clear to auscultation without increased work of breathing. Cardiac exam S1-S2 are regular. The PMI is normal. No significant murmurs or gallops. Abdominal exam active bowel sounds, soft, nontender. No abdominal bruits. Extremities no clubbing cyanosis or edema. Peripheral pulses are normal without bruits. Neurologic exam alert and oriented without any motor  or sensory deficits. Rectal exam normal tone prostate normal size without masses or asymmetry.     Assessment & Plan:  Well visit : health maint UTD  Uses valtrex for recurrent fever blister(s)

## 2011-05-27 NOTE — Patient Instructions (Signed)
Prevent

## 2011-06-17 ENCOUNTER — Other Ambulatory Visit: Payer: Self-pay | Admitting: *Deleted

## 2011-06-17 MED ORDER — VALACYCLOVIR HCL 1 G PO TABS: 1000.0000 mg | ORAL_TABLET | Freq: Every day | ORAL | Status: DC

## 2011-07-23 ENCOUNTER — Telehealth: Payer: Self-pay

## 2011-07-23 MED ORDER — OSELTAMIVIR PHOSPHATE 75 MG PO CAPS: 75.0000 mg | ORAL_CAPSULE | Freq: Two times a day (BID) | ORAL | Status: AC

## 2011-07-23 NOTE — Telephone Encounter (Signed)
Pt's wife was treated for flu by Dr. Tawanna Cooler on yesterday.  Now pt is experiencing the same symptoms.   Per Dr. Cato Mulligan call in Tamiflu 75 mg bid x 5 days.  Pt is aware.

## 2011-07-28 ENCOUNTER — Ambulatory Visit (INDEPENDENT_AMBULATORY_CARE_PROVIDER_SITE_OTHER): Payer: BC Managed Care – PPO | Admitting: Family Medicine

## 2011-07-28 ENCOUNTER — Encounter: Payer: Self-pay | Admitting: Family Medicine

## 2011-07-28 VITALS — BP 136/90 | HR 79 | Temp 98.1°F | Wt 184.0 lb

## 2011-07-28 DIAGNOSIS — H6120 Impacted cerumen, unspecified ear: Secondary | ICD-10-CM

## 2011-07-28 DIAGNOSIS — J019 Acute sinusitis, unspecified: Secondary | ICD-10-CM

## 2011-07-28 MED ORDER — AMOXICILLIN 500 MG PO CAPS: 500.0000 mg | ORAL_CAPSULE | Freq: Two times a day (BID) | ORAL | Status: AC

## 2011-07-28 NOTE — Progress Notes (Signed)
Subjective:    Patient ID: [name], male    DOB: 29-Aug-1955, 55 y.o.   MRN: [id number]  HPI 55 year old white male, nonsmoker, and with complaints of fever bodyaches sinus pressure, and hoarseness times one week. Patient has been using netty pot at home. However over-the-counter medications. He called in last week because he thought he had the flu given a prescription for Tamiflu. It has not helped. Denies any lightheadedness or dizziness, nausea, vomiting, chest pain, palpitations or shortness of breath.  Review of Systems As stated above Past Medical History  Diagnosis Date  . Anemia   . Esophageal reflux   . Diverticulosis of colon (without mention of hemorrhage)   . Family hx colonic polyps   . Hiatal hernia   . Psychosexual dysfunction with inhibited sexual excitement   . Hypertension   . Hyperlipemia   . Acute myeloid leukemia in remission 1999  . History of kidney stones   . Depression   . Arthritis     History   Social History  . Marital Status: Married    Spouse Name: N/A    Number of Children: 2  . Years of Education: N/A   Occupational History  . self employed    Social History Main Topics  . Smoking status: Never Smoker   . Smokeless tobacco: Never Used  . Alcohol Use: Yes     one occ   . Drug Use: No  . Sexually Active: Not on file   Other Topics Concern  . Not on file   Social History Narrative   2 caffeine drinks daily     Past Surgical History  Procedure Date  . Shoulder surgery     x 3     Family History  Problem Relation Age of Onset  . Hypertension Father   . Heart disease Father   . Prostate cancer Father     dx late 47s  . Cancer Father 76    prostate ca  . Celiac disease Mother   . Colon polyps Mother   . Breast cancer Mother   . Cancer Mother     Bladder  . Colon cancer Neg Hx     No Known Allergies  Current Outpatient Prescriptions on File Prior to Visit  Medication Sig Dispense Refill  . amLODipine  (NORVASC) 10 MG tablet TAKE 1 TABLET BY MOUTH EVERY DAY  90 tablet  3  . cyanocobalamin 500 MCG tablet Take 500 mcg by mouth daily.        Marland Kitchen glucosamine-chondroitin 500-400 MG tablet Take 1 tablet by mouth 2 (two) times daily.        . Nutritional Supplements (JUICE PLUS FIBRE PO) Take by mouth. 2 in the morning and 2 at night       . Omega-3 Fatty Acids (OMEGA 3 PO) Take by mouth daily.        Marland Kitchen omeprazole (PRILOSEC OTC) 20 MG tablet Take 20 mg by mouth daily.        Marland Kitchen oseltamivir (TAMIFLU) 75 MG capsule Take 1 capsule (75 mg total) by mouth 2 (two) times daily.  10 capsule  0  . saw palmetto 160 MG capsule Take 160 mg by mouth daily.        . sertraline (ZOLOFT) 100 MG tablet Take 50 mg by mouth daily.        . sildenafil (VIAGRA) 50 MG tablet Take 0.5-1 tablets (25-50 mg total) by mouth daily as needed.  27 tablet  0  .  valACYclovir (VALTREX) 1000 MG tablet Take 1 tablet (1,000 mg total) by mouth daily.  30 tablet  2  . fluticasone (FLONASE) 50 MCG/ACT nasal spray Place 1 spray into the nose daily.          BP 136/90  Pulse 79  Temp(Src) 98.1 F (36.7 C) (Oral)  Wt 184 lb (83.462 kg)chart     Objective:   Physical Exam Alert and oriented in no acute distress ENT: Ears are clear, maxillary and frontal sinus tenderness to palpation bilaterally, pharynx moderately red.  Lungs: Clear to auscultation  Cardiac: Regular rate and rhythm Skin: Warm and dry, no cyanosis Psychiatric normal mood and affect.       Assessment & Plan:  Assessment: Acute sinusitis  The plan: Amoxicillin 500 mg 2 capsules she'll twice a day x10 days. Over-the-counter Zyrtec when necessary alternate Tylenol and ibuprofen when necessary, symptoms worsen or persist, recheck as scheduled and when necessary sooner.

## 2011-07-28 NOTE — Patient Instructions (Signed)

## 2011-08-01 ENCOUNTER — Other Ambulatory Visit: Payer: Self-pay | Admitting: *Deleted

## 2011-08-01 MED ORDER — SERTRALINE HCL 100 MG PO TABS: 50.0000 mg | ORAL_TABLET | Freq: Every day | ORAL | Status: DC

## 2011-10-27 ENCOUNTER — Telehealth: Payer: Self-pay | Admitting: *Deleted

## 2011-10-27 NOTE — Telephone Encounter (Signed)
Pt is asking if Dr. Cato Mulligan will prescribe Zovirax cream for oral fever blisters???

## 2011-10-28 MED ORDER — ACYCLOVIR 5 % EX CREA: 1.0000 "application " | TOPICAL_CREAM | CUTANEOUS | Status: DC

## 2011-10-28 NOTE — Telephone Encounter (Signed)
See med list

## 2012-01-19 ENCOUNTER — Encounter: Payer: Self-pay | Admitting: Family

## 2012-01-19 ENCOUNTER — Ambulatory Visit (INDEPENDENT_AMBULATORY_CARE_PROVIDER_SITE_OTHER): Payer: BC Managed Care – PPO | Admitting: Family

## 2012-01-19 ENCOUNTER — Ambulatory Visit (INDEPENDENT_AMBULATORY_CARE_PROVIDER_SITE_OTHER)
Admission: RE | Admit: 2012-01-19 | Discharge: 2012-01-19 | Disposition: A | Discharge status: Home / Self Care | Payer: BC Managed Care – PPO | Source: Ambulatory Visit | Attending: Family | Admitting: Family

## 2012-01-19 VITALS — BP 130/80 | HR 86 | Temp 97.8°F | Wt 179.0 lb

## 2012-01-19 DIAGNOSIS — M545 Low back pain, unspecified: Secondary | ICD-10-CM

## 2012-01-19 DIAGNOSIS — T148XXA Other injury of unspecified body region, initial encounter: Secondary | ICD-10-CM

## 2012-01-19 MED ORDER — HYDROCODONE-ACETAMINOPHEN 5-500 MG PO TABS: 1.0000 | ORAL_TABLET | Freq: Three times a day (TID) | ORAL | Status: AC | PRN

## 2012-01-19 MED ORDER — METAXALONE 800 MG PO TABS: 800.0000 mg | ORAL_TABLET | Freq: Three times a day (TID) | ORAL | Status: AC

## 2012-01-19 NOTE — Patient Instructions (Signed)
Back Pain, Adult Low back pain is very common. About 1 in 5 people have back pain.The cause of low back pain is rarely dangerous. The pain often gets better over time.About half of people with a sudden onset of back pain feel better in just 2 weeks. About 8 in 10 people feel better by 6 weeks.  CAUSES Some common causes of back pain include:  Strain of the muscles or ligaments supporting the spine.   Wear and tear (degeneration) of the spinal discs.   Arthritis.   Direct injury to the back.  DIAGNOSIS Most of the time, the direct cause of low back pain is not known.However, back pain can be treated effectively even when the exact cause of the pain is unknown.Answering your caregiver's questions about your overall health and symptoms is one of the most accurate ways to make sure the cause of your pain is not dangerous. If your caregiver needs more information, he or she may order lab work or imaging tests (X-rays or MRIs).However, even if imaging tests show changes in your back, this usually does not require surgery. HOME CARE INSTRUCTIONS For many people, back pain returns.Since low back pain is rarely dangerous, it is often a condition that people can learn to manageon their own.   Remain active. It is stressful on the back to sit or stand in one place. Do not sit, drive, or stand in one place for more than 30 minutes at a time. Take short walks on level surfaces as soon as pain allows.Try to increase the length of time you walk each day.   Do not stay in bed.Resting more than 1 or 2 days can delay your recovery.   Do not avoid exercise or work.Your body is made to move.It is not dangerous to be active, even though your back may hurt.Your back will likely heal faster if you return to being active before your pain is gone.   Pay attention to your body when you bend and lift. Many people have less discomfortwhen lifting if they bend their knees, keep the load close to their  bodies,and avoid twisting. Often, the most comfortable positions are those that put less stress on your recovering back.   Find a comfortable position to sleep. Use a firm mattress and lie on your side with your knees slightly bent. If you lie on your back, put a pillow under your knees.   Only take over-the-counter or prescription medicines as directed by your caregiver. Over-the-counter medicines to reduce pain and inflammation are often the most helpful.Your caregiver may prescribe muscle relaxant drugs.These medicines help dull your pain so you can more quickly return to your normal activities and healthy exercise.   Put ice on the injured area.   Put ice in a plastic bag.   Place a towel between your skin and the bag.   Leave the ice on for 15 to 20 minutes, 3 to 4 times a day for the first 2 to 3 days. After that, ice and heat may be alternated to reduce pain and spasms.   Ask your caregiver about trying back exercises and gentle massage. This may be of some benefit.   Avoid feeling anxious or stressed.Stress increases muscle tension and can worsen back pain.It is important to recognize when you are anxious or stressed and learn ways to manage it.Exercise is a great option.  SEEK MEDICAL CARE IF:  You have pain that is not relieved with rest or medicine.   You have   pain that does not improve in 1 week.   You have new symptoms.   You are generally not feeling well.  SEEK IMMEDIATE MEDICAL CARE IF:   You have pain that radiates from your back into your legs.   You develop new bowel or bladder control problems.   You have unusual weakness or numbness in your arms or legs.   You develop nausea or vomiting.   You develop abdominal pain.   You feel faint.  Document Released: 07/28/2005 Document Revised: 07/17/2011 Document Reviewed: 12/16/2010 [employer] Patient Information 2012 [employer]. 

## 2012-01-19 NOTE — Progress Notes (Signed)
Subjective:    Patient ID: [name], male    DOB: [birth year], 56 y.o.   MRN: [id number]  HPI 56 year old white male, nonsmoker, patient of Dr. Cato Mulligan is in today with complaints of low back pain. He has a history of degenerative disc disease x3 years. Over the last one week his pain has been worse than usual. Decrease his pain a 6/10, is worse with movement. He typically the stretching exercises that helps, but is not helping in this case. He describes the pain is aching and throbbing that is not relieved with Advil. He denies any frequency urgency to urinate, no loss of bowel or bladder control   Review of Systems  Constitutional: Negative.   Respiratory: Negative.   Cardiovascular: Negative.   Gastrointestinal: Negative.   Genitourinary: Negative.   Musculoskeletal: Positive for back pain.  Skin: Negative.   Neurological: Negative.   Hematological: Negative.    Past Medical History  Diagnosis Date  . Anemia   . Esophageal reflux   . Diverticulosis of colon (without mention of hemorrhage)   . Family hx colonic polyps   . Hiatal hernia   . Psychosexual dysfunction with inhibited sexual excitement   . Hypertension   . Hyperlipemia   . Acute myeloid leukemia in remission 1999  . History of kidney stones   . Depression   . Arthritis     History   Social History  . Marital Status: Married    Spouse Name: N/A    Number of Children: 2  . Years of Education: N/A   Occupational History  . self employed    Social History Main Topics  . Smoking status: Never Smoker   . Smokeless tobacco: Never Used  . Alcohol Use: Yes     one occ   . Drug Use: No  . Sexually Active: Not on file   Other Topics Concern  . Not on file   Social History Narrative   2 caffeine drinks daily     Past Surgical History  Procedure Date  . Shoulder surgery     x 3     Family History  Problem Relation Age of Onset  . Hypertension Father   . Heart disease Father   . Prostate  cancer Father     dx late 69s  . Cancer Father 78    prostate ca  . Celiac disease Mother   . Colon polyps Mother   . Breast cancer Mother   . Cancer Mother     Bladder  . Colon cancer Neg Hx     No Known Allergies  Current Outpatient Prescriptions on File Prior to Visit  Medication Sig Dispense Refill  . acyclovir cream (ZOVIRAX) 5 % Apply 1 application topically every 3 (three) hours. While awake  2 g  0  . amLODipine (NORVASC) 10 MG tablet TAKE 1 TABLET BY MOUTH EVERY DAY  90 tablet  3  . cyanocobalamin 500 MCG tablet Take 500 mcg by mouth daily.        . fluticasone (FLONASE) 50 MCG/ACT nasal spray Place 1 spray into the nose daily.        Marland Kitchen glucosamine-chondroitin 500-400 MG tablet Take 1 tablet by mouth 2 (two) times daily.        . Nutritional Supplements (JUICE PLUS FIBRE PO) Take by mouth. 2 in the morning and 2 at night       . Omega-3 Fatty Acids (OMEGA 3 PO) Take by mouth daily.        Marland Kitchen  omeprazole (PRILOSEC OTC) 20 MG tablet Take 20 mg by mouth daily.        . saw palmetto 160 MG capsule Take 160 mg by mouth daily.        . sertraline (ZOLOFT) 100 MG tablet Take 0.5 tablets (50 mg total) by mouth daily.  90 tablet  3  . sildenafil (VIAGRA) 50 MG tablet Take 0.5-1 tablets (25-50 mg total) by mouth daily as needed.  27 tablet  0  . valACYclovir (VALTREX) 1000 MG tablet Take 1 tablet (1,000 mg total) by mouth daily.  30 tablet  2    BP 130/80  Pulse 86  Temp(Src) 97.8 F (36.6 C) (Oral)  Wt 179 lb (81.194 kg)  SpO2 98%chart    Objective:   Physical Exam  Constitutional: He is oriented to person, place, and time. He appears well-developed and well-nourished.  Neck: Normal range of motion. Neck supple. No thyromegaly present.  Cardiovascular: Normal rate, regular rhythm and normal heart sounds.   Pulmonary/Chest: Effort normal and breath sounds normal.  Musculoskeletal: Normal range of motion.       Pain elicited to palpation of the right lower back. Minimal  tenderness to palpation of the stenosis process. Has about a 90 flexion at the hip for pain is elicited. Pain with left rotation. Normal right rotation. Negative straight leg raise.  Neurological: He is alert and oriented to person, place, and time.  Skin: Skin is warm and dry.  Psychiatric: He has a normal mood and affect.          Assessment & Plan:  Assessment: Low back pain, muscle strain  Plan: Vicodin one tablet every 8 hours when necessary pain. Skelaxin 800 mg one tablet 3 times a day when necessary. Ice and heat to the affected area. X-ray of L-spine. Patient to call the office if symptoms worsen or persist. Recheck a schedule, when necessary.

## 2012-01-26 ENCOUNTER — Other Ambulatory Visit: Payer: Self-pay | Admitting: Internal Medicine

## 2012-03-17 ENCOUNTER — Emergency Department (HOSPITAL_COMMUNITY): Payer: No Typology Code available for payment source

## 2012-03-17 ENCOUNTER — Encounter (HOSPITAL_COMMUNITY): Payer: Self-pay

## 2012-03-17 ENCOUNTER — Inpatient Hospital Stay (HOSPITAL_COMMUNITY)
Admission: EM | Admit: 2012-03-17 | Discharge: 2012-03-18 | DRG: 184 | Disposition: A | Discharge status: Home / Self Care | Payer: No Typology Code available for payment source | Attending: General Surgery | Admitting: General Surgery

## 2012-03-17 DIAGNOSIS — T07XXXA Unspecified multiple injuries, initial encounter: Secondary | ICD-10-CM

## 2012-03-17 DIAGNOSIS — E785 Hyperlipidemia, unspecified: Secondary | ICD-10-CM | POA: Diagnosis present

## 2012-03-17 DIAGNOSIS — I10 Essential (primary) hypertension: Secondary | ICD-10-CM | POA: Diagnosis present

## 2012-03-17 DIAGNOSIS — C9291 Myeloid leukemia, unspecified in remission: Secondary | ICD-10-CM | POA: Diagnosis present

## 2012-03-17 DIAGNOSIS — Z860100 Personal history of colon polyps, unspecified: Secondary | ICD-10-CM

## 2012-03-17 DIAGNOSIS — I959 Hypotension, unspecified: Secondary | ICD-10-CM

## 2012-03-17 DIAGNOSIS — Z79899 Other long term (current) drug therapy: Secondary | ICD-10-CM

## 2012-03-17 DIAGNOSIS — S2220XA Unspecified fracture of sternum, initial encounter for closed fracture: Secondary | ICD-10-CM

## 2012-03-17 DIAGNOSIS — F3289 Other specified depressive episodes: Secondary | ICD-10-CM | POA: Diagnosis present

## 2012-03-17 DIAGNOSIS — K5730 Diverticulosis of large intestine without perforation or abscess without bleeding: Secondary | ICD-10-CM | POA: Diagnosis present

## 2012-03-17 DIAGNOSIS — M5030 Other cervical disc degeneration, unspecified cervical region: Secondary | ICD-10-CM | POA: Diagnosis present

## 2012-03-17 DIAGNOSIS — F329 Major depressive disorder, single episode, unspecified: Secondary | ICD-10-CM | POA: Diagnosis present

## 2012-03-17 DIAGNOSIS — K219 Gastro-esophageal reflux disease without esophagitis: Secondary | ICD-10-CM | POA: Diagnosis present

## 2012-03-17 DIAGNOSIS — M129 Arthropathy, unspecified: Secondary | ICD-10-CM | POA: Diagnosis present

## 2012-03-17 LAB — URINALYSIS, MICROSCOPIC ONLY
Glucose, UA: NEGATIVE mg/dL
Ketones, ur: NEGATIVE mg/dL
Leukocytes, UA: NEGATIVE
Protein, ur: NEGATIVE mg/dL
Urobilinogen, UA: 0.2 mg/dL (ref 0.0–1.0)

## 2012-03-17 LAB — CBC
Hemoglobin: 15.3 g/dL (ref 13.0–17.0)
MCH: 32.8 pg (ref 26.0–34.0)
MCHC: 35.2 g/dL (ref 30.0–36.0)
Platelets: 301 10*3/uL (ref 150–400)
RDW: 12.2 % (ref 11.5–15.5)

## 2012-03-17 LAB — COMPREHENSIVE METABOLIC PANEL
ALT: 41 U/L (ref 0–53)
AST: 31 U/L (ref 0–37)
Alkaline Phosphatase: 64 U/L (ref 39–117)
CO2: 28 mEq/L (ref 19–32)
Chloride: 98 mEq/L (ref 96–112)
GFR calc Af Amer: >90 mL/min (ref >90)
GFR calc non Af Amer: 80 mL/min — ABNORMAL LOW (ref >90)
Glucose, Bld: 130 mg/dL — ABNORMAL HIGH (ref 70–99)
Sodium: 138 mEq/L (ref 135–145)
Total Bilirubin: 0.4 mg/dL (ref 0.3–1.2)

## 2012-03-17 LAB — POCT I-STAT, CHEM 8
BUN: 15 mg/dL (ref 6–23)
Calcium, Ion: 1.17 mmol/L (ref 1.12–1.23)
Creatinine, Ser: 1.1 mg/dL (ref 0.50–1.35)
Glucose, Bld: 132 mg/dL — ABNORMAL HIGH (ref 70–99)
TCO2: 25 mmol/L (ref 0–100)

## 2012-03-17 LAB — TROPONIN I: Troponin I: <0.3 ng/mL (ref <0.30)

## 2012-03-17 LAB — CARDIAC PANEL(CRET KIN+CKTOT+MB+TROPI)
Relative Index: 2 (ref 0.0–2.5)
Total CK: 266 U/L — ABNORMAL HIGH (ref 7–232)

## 2012-03-17 LAB — PROTIME-INR: INR: 0.83 (ref 0.00–1.49)

## 2012-03-17 LAB — CDS SEROLOGY

## 2012-03-17 MED ORDER — ONDANSETRON HCL 4 MG/2ML IJ SOLN: 4.0000 mg | Freq: Four times a day (QID) | INTRAMUSCULAR | Status: DC | PRN

## 2012-03-17 MED ORDER — HYDROCODONE-ACETAMINOPHEN 5-325 MG PO TABS
1.0000 | ORAL_TABLET | ORAL | Status: DC | PRN
  Administered 2012-03-17: 1 via ORAL
  Administered 2012-03-18 (×4): 2 via ORAL
  Filled 2012-03-17 (×5): qty 2

## 2012-03-17 MED ORDER — IOHEXOL 300 MG/ML  SOLN
100.0000 mL | Freq: Once | INTRAMUSCULAR | Status: AC | PRN
  Administered 2012-03-17: 100 mL via INTRAVENOUS

## 2012-03-17 MED ORDER — OMEPRAZOLE MAGNESIUM 20 MG PO TBEC: 20.0000 mg | DELAYED_RELEASE_TABLET | Freq: Every day | ORAL | Status: DC

## 2012-03-17 MED ORDER — FENTANYL CITRATE 0.05 MG/ML IJ SOLN
25.0000 ug | Freq: Once | INTRAMUSCULAR | Status: AC
  Administered 2012-03-17: 25 ug via INTRAVENOUS

## 2012-03-17 MED ORDER — FENTANYL CITRATE 0.05 MG/ML IJ SOLN
25.0000 ug | Freq: Once | INTRAMUSCULAR | Status: AC
  Administered 2012-03-17: 25 ug via INTRAVENOUS
  Filled 2012-03-17: qty 2

## 2012-03-17 MED ORDER — DIPHENHYDRAMINE HCL 50 MG/ML IJ SOLN
12.5000 mg | Freq: Four times a day (QID) | INTRAMUSCULAR | Status: DC | PRN
  Administered 2012-03-17: 12.5 mg via INTRAVENOUS
  Filled 2012-03-17: qty 1

## 2012-03-17 MED ORDER — ACETAMINOPHEN 650 MG RE SUPP: 650.0000 mg | Freq: Four times a day (QID) | RECTAL | Status: DC | PRN

## 2012-03-17 MED ORDER — SERTRALINE HCL 50 MG PO TABS
50.0000 mg | ORAL_TABLET | Freq: Every day | ORAL | Status: DC
  Administered 2012-03-18: 50 mg via ORAL
  Filled 2012-03-17: qty 1

## 2012-03-17 MED ORDER — KCL IN DEXTROSE-NACL 20-5-0.9 MEQ/L-%-% IV SOLN
INTRAVENOUS | Status: DC
  Administered 2012-03-17: 16:00:00 via INTRAVENOUS
  Filled 2012-03-17 (×5): qty 1000

## 2012-03-17 MED ORDER — PANTOPRAZOLE SODIUM 40 MG PO TBEC
40.0000 mg | DELAYED_RELEASE_TABLET | Freq: Every day | ORAL | Status: DC
  Administered 2012-03-18: 40 mg via ORAL
  Filled 2012-03-17: qty 1

## 2012-03-17 MED ORDER — ACETAMINOPHEN 325 MG PO TABS: 650.0000 mg | ORAL_TABLET | Freq: Four times a day (QID) | ORAL | Status: DC | PRN

## 2012-03-17 MED ORDER — DIPHENHYDRAMINE HCL 12.5 MG/5ML PO ELIX
12.5000 mg | ORAL_SOLUTION | Freq: Four times a day (QID) | ORAL | Status: DC | PRN
  Filled 2012-03-17: qty 5

## 2012-03-17 MED ORDER — FENTANYL CITRATE 0.05 MG/ML IJ SOLN
INTRAMUSCULAR | Status: AC
  Filled 2012-03-17: qty 2

## 2012-03-17 MED ORDER — SODIUM CHLORIDE 0.9 % IV SOLN
Freq: Once | INTRAVENOUS | Status: AC
  Administered 2012-03-17: 13:00:00 via INTRAVENOUS

## 2012-03-17 MED ORDER — SODIUM CHLORIDE 0.9 % IV SOLN
Freq: Once | INTRAVENOUS | Status: AC
  Administered 2012-03-17: 14:00:00 via INTRAVENOUS

## 2012-03-17 MED ORDER — KETOROLAC TROMETHAMINE 30 MG/ML IJ SOLN
30.0000 mg | Freq: Three times a day (TID) | INTRAMUSCULAR | Status: DC | PRN
  Administered 2012-03-18: 30 mg via INTRAVENOUS
  Filled 2012-03-17: qty 1

## 2012-03-17 MED ORDER — ONDANSETRON HCL 4 MG/2ML IJ SOLN
4.0000 mg | Freq: Once | INTRAMUSCULAR | Status: AC
  Administered 2012-03-17: 4 mg via INTRAVENOUS
  Filled 2012-03-17: qty 2

## 2012-03-17 MED ORDER — HYDROMORPHONE HCL PF 1 MG/ML IJ SOLN
0.5000 mg | Freq: Once | INTRAMUSCULAR | Status: AC
  Administered 2012-03-17: 0.5 mg via INTRAVENOUS
  Filled 2012-03-17: qty 1

## 2012-03-17 MED ORDER — HYDROMORPHONE HCL PF 1 MG/ML IJ SOLN: 0.5000 mg | INTRAMUSCULAR | Status: DC | PRN

## 2012-03-17 MED ORDER — POTASSIUM CHLORIDE IN NACL 20-0.45 MEQ/L-% IV SOLN: INTRAVENOUS | Status: DC

## 2012-03-17 NOTE — H&P (Signed)
I have personally interviewed this patient, examined him, reviewed all of his lab tests and radiographic studies.  On further review of his CT scan, we have identified a sternal fracture..  No other injuries are identified. [facility] for telemetry and observation for possible cardiac contusion.   Angelia Mould. Derrell Lolling, M.D., [facility] General and Minimally invasive Surgery Breast and Colorectal Surgery Office:   [phone] Pager:   [phone]
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
## 2012-03-17 NOTE — Progress Notes (Signed)
Pt feels like pain is coming back and is requesting more pain medication. ED MD notified and orders given.
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
## 2012-03-17 NOTE — ED Notes (Signed)
Pt not stuck for cardiac panel at this time per Admitting PA.
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
## 2012-03-18 DIAGNOSIS — I517 Cardiomegaly: Secondary | ICD-10-CM

## 2012-03-18 LAB — CARDIAC PANEL(CRET KIN+CKTOT+MB+TROPI): Relative Index: 1.9 (ref 0.0–2.5)

## 2012-03-18 MED ORDER — HYDROCODONE-ACETAMINOPHEN 5-325 MG PO TABS: 1.0000 | ORAL_TABLET | ORAL | Status: AC | PRN

## 2012-03-18 MED FILL — Ondansetron HCl Inj 4 MG/2ML (2 MG/ML): INTRAMUSCULAR | Qty: 2 | Status: AC
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
## 2012-03-18 NOTE — Progress Notes (Signed)
ECHO does not demonstrate any wall abnormalities and EF is 50-55%.  Will discharge.  He want to go out of town on vacation tonight, but I advised against that.

## 2012-03-18 NOTE — Progress Notes (Signed)
DC instructions given to pt re: meds, activity, diet, s/s of problems., and community resources.  No s/s of any distress at dc.  Wife to take pt in car.

## 2012-03-18 NOTE — Progress Notes (Signed)
  Echocardiogram 2D Echocardiogram has been performed.  [name] 03/18/2012, 1:47 PM

## 2012-03-18 NOTE — Progress Notes (Signed)
UR complete 

## 2012-03-19 ENCOUNTER — Telehealth (HOSPITAL_COMMUNITY): Payer: Self-pay | Admitting: Orthopedic Surgery

## 2012-03-19 MED ORDER — TRAMADOL HCL 50 MG PO TABS: 50.0000 mg | ORAL_TABLET | Freq: Four times a day (QID) | ORAL | Status: DC | PRN

## 2012-03-19 NOTE — Telephone Encounter (Signed)
Doesn't want to take hydrocodone because of the way it makes him feel. Will try him on tramadol and I suggested he take ibuprofen 800mg  tid.

## 2012-03-22 ENCOUNTER — Telehealth (HOSPITAL_COMMUNITY): Payer: Self-pay | Admitting: Internal Medicine

## 2012-03-23 NOTE — Telephone Encounter (Signed)
Left message

## 2012-03-24 ENCOUNTER — Telehealth (HOSPITAL_COMMUNITY): Payer: Self-pay | Admitting: Internal Medicine

## 2012-03-24 NOTE — Telephone Encounter (Signed)
Left message

## 2012-03-25 ENCOUNTER — Telehealth (HOSPITAL_COMMUNITY): Payer: Self-pay | Admitting: Orthopedic Surgery

## 2012-03-26 DIAGNOSIS — S2220XA Unspecified fracture of sternum, initial encounter for closed fracture: Secondary | ICD-10-CM | POA: Diagnosis present

## 2012-03-26 NOTE — Telephone Encounter (Signed)
Needed a letter regarding time out of work. Will write and mail today.
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
## 2012-03-29 NOTE — Discharge Summary (Signed)
Agree with discharge summary.

## 2012-03-31 ENCOUNTER — Other Ambulatory Visit: Payer: Self-pay | Admitting: Internal Medicine

## 2012-04-01 ENCOUNTER — Telehealth (HOSPITAL_COMMUNITY): Payer: Self-pay | Admitting: Orthopedic Surgery

## 2012-04-01 NOTE — Telephone Encounter (Signed)
[name] called to say that he had developed some pleuritic right CP that began yesterday afternoon. It's been persistent but doesn't seem to be worsening. Denies SOB. I suggested some NSAID's and a heating pad to the area and if that helped it was probably a muscle spasm as he suspected. If that didn't help or the pain got worse I suggested he f/u with his PCP but to call me if he couldn't get seen in a timely fashion.

## 2012-04-02 ENCOUNTER — Telehealth: Payer: Self-pay | Admitting: Internal Medicine

## 2012-04-08 ENCOUNTER — Ambulatory Visit (INDEPENDENT_AMBULATORY_CARE_PROVIDER_SITE_OTHER): Payer: Self-pay | Admitting: Internal Medicine

## 2012-04-08 ENCOUNTER — Encounter: Payer: Self-pay | Admitting: Internal Medicine

## 2012-04-08 VITALS — BP 118/80 | Temp 98.0°F | Wt 181.0 lb

## 2012-04-08 DIAGNOSIS — S2220XA Unspecified fracture of sternum, initial encounter for closed fracture: Secondary | ICD-10-CM
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
## 2012-04-08 NOTE — Patient Instructions (Signed)
Call or return to clinic prn if these symptoms worsen or fail to improve as anticipated.  Annual CPX

## 2012-05-24 ENCOUNTER — Ambulatory Visit (INDEPENDENT_AMBULATORY_CARE_PROVIDER_SITE_OTHER): Payer: PRIVATE HEALTH INSURANCE | Admitting: Internal Medicine

## 2012-05-24 ENCOUNTER — Encounter: Payer: Self-pay | Admitting: Internal Medicine

## 2012-05-24 VITALS — BP 128/80 | Wt 180.0 lb

## 2012-05-24 DIAGNOSIS — R079 Chest pain, unspecified: Secondary | ICD-10-CM

## 2012-05-24 DIAGNOSIS — I10 Essential (primary) hypertension: Secondary | ICD-10-CM

## 2012-05-24 DIAGNOSIS — Z23 Encounter for immunization: Secondary | ICD-10-CM

## 2012-05-24 DIAGNOSIS — S2220XA Unspecified fracture of sternum, initial encounter for closed fracture: Secondary | ICD-10-CM

## 2012-05-24 MED ORDER — SILDENAFIL CITRATE 100 MG PO TABS: 100.0000 mg | ORAL_TABLET | ORAL | Status: DC | PRN
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
## 2012-07-29 ENCOUNTER — Other Ambulatory Visit (INDEPENDENT_AMBULATORY_CARE_PROVIDER_SITE_OTHER): Payer: BC Managed Care – PPO

## 2012-07-29 DIAGNOSIS — Z0000 Encounter for general adult medical examination without abnormal findings: Secondary | ICD-10-CM

## 2012-07-29 LAB — POCT URINALYSIS DIPSTICK
Blood, UA: NEGATIVE
Leukocytes, UA: NEGATIVE
Nitrite, UA: NEGATIVE
Protein, UA: NEGATIVE
pH, UA: 5

## 2012-07-29 LAB — CBC WITH DIFFERENTIAL/PLATELET
Basophils Relative: 0.6 % (ref 0.0–3.0)
Eosinophils Absolute: 0.2 10*3/uL (ref 0.0–0.7)
HCT: 40.7 % (ref 39.0–52.0)
Hemoglobin: 14.1 g/dL (ref 13.0–17.0)
MCHC: 34.8 g/dL (ref 30.0–36.0)
MCV: 94.2 fl (ref 78.0–100.0)
Monocytes Absolute: 0.6 10*3/uL (ref 0.1–1.0)
Neutro Abs: 2 10*3/uL (ref 1.4–7.7)
RBC: 4.32 Mil/uL (ref 4.22–5.81)

## 2012-07-29 LAB — BASIC METABOLIC PANEL
CO2: 32 mEq/L (ref 19–32)
Chloride: 105 mEq/L (ref 96–112)
Sodium: 142 mEq/L (ref 135–145)

## 2012-07-29 LAB — HEPATIC FUNCTION PANEL
Albumin: 4.4 g/dL (ref 3.5–5.2)
Total Protein: 6.8 g/dL (ref 6.0–8.3)

## 2012-07-29 LAB — PSA: PSA: 0.87 ng/mL (ref 0.10–4.00)

## 2012-07-29 LAB — LDL CHOLESTEROL, DIRECT: Direct LDL: 146.2 mg/dL

## 2012-07-29 LAB — LIPID PANEL: VLDL: 27 mg/dL (ref 0.0–40.0)

## 2012-08-06 ENCOUNTER — Encounter: Payer: BC Managed Care – PPO | Admitting: Internal Medicine

## 2012-08-26 ENCOUNTER — Telehealth (HOSPITAL_COMMUNITY): Payer: Self-pay | Admitting: Orthopedic Surgery
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
## 2012-08-31 ENCOUNTER — Ambulatory Visit (INDEPENDENT_AMBULATORY_CARE_PROVIDER_SITE_OTHER): Payer: BC Managed Care – PPO | Admitting: Internal Medicine

## 2012-08-31 ENCOUNTER — Encounter: Payer: Self-pay | Admitting: Internal Medicine

## 2012-08-31 VITALS — BP 122/82 | Temp 99.0°F | Ht 71.0 in | Wt 180.0 lb

## 2012-08-31 DIAGNOSIS — G4710 Hypersomnia, unspecified: Secondary | ICD-10-CM

## 2012-08-31 DIAGNOSIS — Z0000 Encounter for general adult medical examination without abnormal findings: Secondary | ICD-10-CM

## 2012-08-31 NOTE — Progress Notes (Signed)
Patient ID: COVEY BALLER, male   DOB: 1956-07-09, 57 y.o.   MRN: [id number]   cpx  Past Medical History  Diagnosis Date  . Anemia   . Esophageal reflux   . Diverticulosis of colon (without mention of hemorrhage)   . Family hx colonic polyps   . Hiatal hernia   . Psychosexual dysfunction with inhibited sexual excitement   . Hypertension   . Hyperlipemia   . Acute myeloid leukemia in remission 1999  . History of kidney stones   . Depression   . Arthritis     History   Social History  . Marital Status: Married    Spouse Name: N/A    Number of Children: 2  . Years of Education: N/A   Occupational History  . self employed    Social History Main Topics  . Smoking status: Never Smoker   . Smokeless tobacco: Never Used  . Alcohol Use: Yes     Comment: one occ   . Drug Use: No  . Sexually Active: No   Other Topics Concern  . Not on file   Social History Narrative   2 caffeine drinks daily     Past Surgical History  Procedure Date  . Shoulder surgery     x 3     Family History  Problem Relation Age of Onset  . Hypertension Father   . Heart disease Father   . Prostate cancer Father     dx late 7s  . Cancer Father 63    prostate ca  . Celiac disease Mother   . Colon polyps Mother   . Breast cancer Mother   . Cancer Mother     Bladder  . Colon cancer Neg Hx     Allergies  Allergen Reactions  . Marinol (Dronabinol)     Delusions.    Current Outpatient Prescriptions on File Prior to Visit  Medication Sig Dispense Refill  . acyclovir ointment (ZOVIRAX) 5 % Apply 1 application topically every 3 (three) hours as needed. As needed for fever blisters.      Marland Kitchen amLODipine (NORVASC) 10 MG tablet Take 10 mg by mouth daily.      . cyanocobalamin 500 MCG tablet Take 500 mcg by mouth daily.      Marland Kitchen glucosamine-chondroitin 500-400 MG tablet Take 1 tablet by mouth 2 (two) times daily.        Marland Kitchen HYDROcodone-acetaminophen (NORCO/VICODIN) 5-325 MG per tablet       .  ibuprofen (ADVIL,MOTRIN) 100 MG tablet Take 800 mg by mouth every 8 (eight) hours as needed.      Marland Kitchen omeprazole (PRILOSEC OTC) 20 MG tablet Take 20 mg by mouth daily.        . saw palmetto 160 MG capsule Take 160 mg by mouth daily.        . sertraline (ZOLOFT) 100 MG tablet Take 50 mg by mouth daily.      . sildenafil (VIAGRA) 100 MG tablet Take 1 tablet (100 mg total) by mouth as needed for erectile dysfunction.  3 tablet  11  . traMADol (ULTRAM) 50 MG tablet Take 1-2 tablets (50-100 mg total) by mouth every 6 (six) hours as needed for pain.  50 tablet  1  . valACYclovir (VALTREX) 1000 MG tablet TAKE 1 TABLET BY MOUTH EVERY DAY  30 tablet  2     patient denies chest pain, shortness of breath, orthopnea. Denies lower extremity edema, abdominal pain, change in appetite, change in bowel  movements. Patient denies rashes, musculoskeletal complaints. No other specific complaints in a complete review of systems.   There were no vitals taken for this visit. Well-developed male in no acute distress. HEENT exam atraumatic, normocephalic, extraocular muscles are intact. Conjunctivae are pink without exudate. Neck is supple without lymphadenopathy, thyromegaly, jugular venous distention. Chest is clear to auscultation without increased work of breathing. Cardiac exam S1-S2 are regular. The PMI is normal. No significant murmurs or gallops. Abdominal exam active bowel sounds, soft, nontender. No abdominal bruits. Extremities no clubbing cyanosis or edema. Peripheral pulses are normal without bruits. Neurologic exam alert and oriented without any motor or sensory deficits. Rectal exam normal tone prostate normal size without masses or asymmetry.   A/p- well visit- health maint UTD He complains of daytime hypersomnolence and snoring-- check sleep study at home

## 2012-09-08 ENCOUNTER — Ambulatory Visit: Payer: BC Managed Care – PPO | Admitting: Family

## 2012-09-08 ENCOUNTER — Telehealth: Payer: Self-pay | Admitting: *Deleted

## 2012-09-08 NOTE — Telephone Encounter (Signed)
Left message on cell phone and sent a message to him via mychart

## 2012-09-08 NOTE — Telephone Encounter (Signed)
Message copied by Jacqualyn Posey on Wed Sep 08, 2012 11:23 AM ------      Message from: Lindley Magnus      Created: Mon Sep 06, 2012  1:42 PM      Regarding: RE: denied sleep test       Call patient and inform him...      ----- Message -----         From: Lavada Mesi, CMA         Sent: 09/06/2012   8:08 AM           To: Lindley Magnus, MD      Subject: Annell Greening: denied sleep test                                                ----- Message -----         From: Cherlyn Labella         Sent: 09/06/2012   8:05 AM           To: Lavada Mesi, CMA      Subject: denied sleep test                                        Pt sleep test was denied they felt it did not meet medical necessity. Please advise

## 2012-09-12 ENCOUNTER — Other Ambulatory Visit: Payer: Self-pay | Admitting: Internal Medicine

## 2012-12-03 ENCOUNTER — Other Ambulatory Visit: Payer: Self-pay | Admitting: Internal Medicine

## 2012-12-14 ENCOUNTER — Emergency Department (HOSPITAL_COMMUNITY)
Admission: EM | Admit: 2012-12-14 | Discharge: 2012-12-14 | Disposition: A | Discharge status: Home / Self Care | Payer: BC Managed Care – PPO | Attending: Emergency Medicine | Admitting: Emergency Medicine

## 2012-12-14 ENCOUNTER — Encounter (HOSPITAL_COMMUNITY): Payer: Self-pay | Admitting: *Deleted

## 2012-12-14 ENCOUNTER — Emergency Department (HOSPITAL_COMMUNITY): Payer: BC Managed Care – PPO

## 2012-12-14 DIAGNOSIS — M549 Dorsalgia, unspecified: Secondary | ICD-10-CM | POA: Insufficient documentation

## 2012-12-14 DIAGNOSIS — R05 Cough: Secondary | ICD-10-CM | POA: Insufficient documentation

## 2012-12-14 DIAGNOSIS — R059 Cough, unspecified: Secondary | ICD-10-CM | POA: Insufficient documentation

## 2012-12-14 DIAGNOSIS — Z8719 Personal history of other diseases of the digestive system: Secondary | ICD-10-CM | POA: Insufficient documentation

## 2012-12-14 DIAGNOSIS — J3489 Other specified disorders of nose and nasal sinuses: Secondary | ICD-10-CM | POA: Insufficient documentation

## 2012-12-14 DIAGNOSIS — B349 Viral infection, unspecified: Secondary | ICD-10-CM

## 2012-12-14 DIAGNOSIS — B9789 Other viral agents as the cause of diseases classified elsewhere: Secondary | ICD-10-CM | POA: Insufficient documentation

## 2012-12-14 DIAGNOSIS — R509 Fever, unspecified: Secondary | ICD-10-CM

## 2012-12-14 DIAGNOSIS — I10 Essential (primary) hypertension: Secondary | ICD-10-CM | POA: Insufficient documentation

## 2012-12-14 DIAGNOSIS — Z856 Personal history of leukemia: Secondary | ICD-10-CM | POA: Insufficient documentation

## 2012-12-14 DIAGNOSIS — F329 Major depressive disorder, single episode, unspecified: Secondary | ICD-10-CM | POA: Insufficient documentation

## 2012-12-14 DIAGNOSIS — Z79899 Other long term (current) drug therapy: Secondary | ICD-10-CM | POA: Insufficient documentation

## 2012-12-14 DIAGNOSIS — Z862 Personal history of diseases of the blood and blood-forming organs and certain disorders involving the immune mechanism: Secondary | ICD-10-CM | POA: Insufficient documentation

## 2012-12-14 DIAGNOSIS — R55 Syncope and collapse: Secondary | ICD-10-CM | POA: Insufficient documentation

## 2012-12-14 DIAGNOSIS — F3289 Other specified depressive episodes: Secondary | ICD-10-CM | POA: Insufficient documentation

## 2012-12-14 DIAGNOSIS — IMO0001 Reserved for inherently not codable concepts without codable children: Secondary | ICD-10-CM | POA: Insufficient documentation

## 2012-12-14 DIAGNOSIS — Z87442 Personal history of urinary calculi: Secondary | ICD-10-CM | POA: Insufficient documentation

## 2012-12-14 DIAGNOSIS — E785 Hyperlipidemia, unspecified: Secondary | ICD-10-CM | POA: Insufficient documentation

## 2012-12-14 DIAGNOSIS — R197 Diarrhea, unspecified: Secondary | ICD-10-CM | POA: Insufficient documentation

## 2012-12-14 DIAGNOSIS — R109 Unspecified abdominal pain: Secondary | ICD-10-CM | POA: Insufficient documentation

## 2012-12-14 DIAGNOSIS — K219 Gastro-esophageal reflux disease without esophagitis: Secondary | ICD-10-CM | POA: Insufficient documentation

## 2012-12-14 DIAGNOSIS — R110 Nausea: Secondary | ICD-10-CM | POA: Insufficient documentation

## 2012-12-14 DIAGNOSIS — H6690 Otitis media, unspecified, unspecified ear: Secondary | ICD-10-CM | POA: Insufficient documentation

## 2012-12-14 DIAGNOSIS — Z8739 Personal history of other diseases of the musculoskeletal system and connective tissue: Secondary | ICD-10-CM | POA: Insufficient documentation

## 2012-12-14 DIAGNOSIS — E860 Dehydration: Secondary | ICD-10-CM | POA: Insufficient documentation

## 2012-12-14 DIAGNOSIS — R51 Headache: Secondary | ICD-10-CM | POA: Insufficient documentation

## 2012-12-14 LAB — CBC WITH DIFFERENTIAL/PLATELET
Basophils Absolute: 0 10*3/uL (ref 0.0–0.1)
Eosinophils Relative: 0 % (ref 0–5)
Lymphocytes Relative: 4 % — ABNORMAL LOW (ref 12–46)
Lymphs Abs: 0.3 10*3/uL — ABNORMAL LOW (ref 0.7–4.0)
MCV: 87.8 fL (ref 78.0–100.0)
Neutro Abs: 7.3 10*3/uL (ref 1.7–7.7)
Neutrophils Relative %: 87 % — ABNORMAL HIGH (ref 43–77)
Platelets: 219 10*3/uL (ref 150–400)
RBC: 4.19 MIL/uL — ABNORMAL LOW (ref 4.22–5.81)
WBC: 8.4 10*3/uL (ref 4.0–10.5)

## 2012-12-14 LAB — BASIC METABOLIC PANEL
CO2: 22 mEq/L (ref 19–32)
Chloride: 102 mEq/L (ref 96–112)
Glucose, Bld: 136 mg/dL — ABNORMAL HIGH (ref 70–99)
Potassium: 3.6 mEq/L (ref 3.5–5.1)
Sodium: 136 mEq/L (ref 135–145)

## 2012-12-14 LAB — URINALYSIS, ROUTINE W REFLEX MICROSCOPIC
Bilirubin Urine: NEGATIVE
Ketones, ur: NEGATIVE mg/dL
Nitrite: NEGATIVE
Protein, ur: NEGATIVE mg/dL
Urobilinogen, UA: 0.2 mg/dL (ref 0.0–1.0)

## 2012-12-14 MED ORDER — SODIUM CHLORIDE 0.9 % IV BOLUS (SEPSIS)
1000.0000 mL | Freq: Once | INTRAVENOUS | Status: AC
  Administered 2012-12-14: 1000 mL via INTRAVENOUS

## 2012-12-14 MED ORDER — ACETAMINOPHEN 325 MG PO TABS
650.0000 mg | ORAL_TABLET | Freq: Once | ORAL | Status: AC
  Administered 2012-12-14: 650 mg via ORAL
  Filled 2012-12-14: qty 2

## 2012-12-14 NOTE — ED Notes (Signed)
The pt is alert no distress wife at the bedside.  nss infused.  1000cc nss added to iv kvo

## 2012-12-14 NOTE — ED Provider Notes (Signed)
Patient seen/examined in the Emergency Department in conjunction with Resident Physician Provider Beverely Pace Patient reports fever, diarrhea, myalgias, near syncopal episodes, he denies any recent travel and denies tick bites Exam : awake/alert, no distress.  Neck is supple, doubt meningitis.  No rash noted.  No harsh murmur noted.   Plan: rehydrate, check CXR (pneumonia is possible)/labs and reassess Likely d/c home   Joya Gaskins, MD 12/14/12 1540

## 2012-12-14 NOTE — ED Notes (Addendum)
Pt states that his BP was lower than usual at PCP office today. Pt states that he is normally 130's systolic but was 578. Pt reports decreased appetite today.

## 2012-12-14 NOTE — ED Provider Notes (Signed)
History     CSN: [id number]  Arrival date & time 12/14/12  1448   None     Chief Complaint  Patient presents with  . Loss of Consciousness    (Consider location/radiation/quality/duration/timing/severity/associated sxs/prior treatment) Patient is a 57 y.o. male presenting with general illness and syncope. The history is provided by the patient.  Illness  The current episode started yesterday. The problem occurs continuously. The problem has been gradually worsening. The problem is mild. Associated symptoms include a fever, abdominal pain (mild, left sided, dull, non-radiating), diarrhea (one loose BM today), nausea, congestion, headaches (mild, achy) and cough. Pertinent negatives include no photophobia, no vomiting, no ear pain, no rhinorrhea, no sore throat, no swollen glands, no neck pain, no neck stiffness, no rash and no eye pain. He has been drinking less than usual.  Loss of Consciousness  This is a new problem. The current episode started 1 to 2 hours ago. The problem has been resolved. He lost consciousness for a period of less than one minute. Associated symptoms include abdominal pain (mild, left sided, dull, non-radiating), back pain (chronic, no change), congestion, fever, headaches (mild, achy) and nausea. Pertinent negatives include vomiting. Treatments tried: IV fluids. The treatment provided significant relief. His past medical history is significant for HTN. His past medical history does not include CAD, CVA, DM or seizures.    Past Medical History  Diagnosis Date  . Anemia   . Esophageal reflux   . Diverticulosis of colon (without mention of hemorrhage)   . Family hx colonic polyps   . Hiatal hernia   . Psychosexual dysfunction with inhibited sexual excitement   . Hypertension   . Hyperlipemia   . Acute myeloid leukemia in remission 1999  . History of kidney stones   . Depression   . Arthritis     Past Surgical History  Procedure Laterality Date  . Shoulder  surgery      x 3     Family History  Problem Relation Age of Onset  . Hypertension Father   . Heart disease Father   . Prostate cancer Father     dx late 39s  . Cancer Father 1    prostate ca  . Celiac disease Mother   . Colon polyps Mother   . Breast cancer Mother   . Cancer Mother     Bladder  . Colon cancer Neg Hx     History  Substance Use Topics  . Smoking status: Never Smoker   . Smokeless tobacco: Never Used  . Alcohol Use: Yes     Comment: one occ       Review of Systems  Constitutional: Positive for fever and chills.  HENT: Positive for congestion. Negative for ear pain, sore throat, rhinorrhea, trouble swallowing, neck pain and neck stiffness.   Eyes: Negative for photophobia and pain.  Respiratory: Positive for cough.   Cardiovascular: Positive for syncope.  Gastrointestinal: Positive for nausea, abdominal pain (mild, left sided, dull, non-radiating) and diarrhea (one loose BM today). Negative for vomiting.  Genitourinary: Negative for dysuria.  Musculoskeletal: Positive for myalgias and back pain (chronic, no change).  Skin: Negative for rash.  Neurological: Positive for headaches (mild, achy).  All other systems reviewed and are negative.    Allergies  Marinol  Home Medications   Current Outpatient Rx  Name  Route  Sig  Dispense  Refill  . amLODipine (NORVASC) 10 MG tablet   Oral   Take 10 mg by mouth daily.         Marland Kitchen  cyanocobalamin 500 MCG tablet   Oral   Take 500 mcg by mouth daily.         . cyclobenzaprine (FLEXERIL) 5 MG tablet      TAKE 1 TABLET BY MOUTH AT BEDTIME AS NEEDED   10 tablet   0   . EPIPEN 2-PAK 0.3 MG/0.3ML DEVI      as needed.         Marland Kitchen ibuprofen (ADVIL,MOTRIN) 100 MG tablet   Oral   Take 800 mg by mouth every 8 (eight) hours as needed.         Marland Kitchen omeprazole (PRILOSEC OTC) 20 MG tablet   Oral   Take 20 mg by mouth daily.           . saw palmetto 160 MG capsule   Oral   Take 160 mg by mouth  daily.           . sertraline (ZOLOFT) 100 MG tablet   Oral   Take 50 mg by mouth daily.         . sertraline (ZOLOFT) 100 MG tablet      TAKE 1/2 TABLET BY MOUTH EVERY DAY   90 tablet   2   . sildenafil (VIAGRA) 100 MG tablet   Oral   Take 50 mg by mouth as needed.         . valACYclovir (VALTREX) 1000 MG tablet      TAKE 1 TABLET BY MOUTH EVERY DAY   30 tablet   2     BP 119/77  Pulse 93  Temp(Src) 100.5 F (38.1 C) (Oral)  Resp 24  SpO2 100%  Physical Exam  Vitals reviewed. Constitutional: He is oriented to person, place, and time. He appears well-developed and well-nourished. No distress.  HENT:  Head: Normocephalic.  Right Ear: External ear normal. No middle ear effusion.  Left Ear: External ear normal. A middle ear effusion (non-purulent) is present.  Nose: Nose normal.  Mouth/Throat: Oropharynx is clear and moist. Mucous membranes are dry. No oropharyngeal exudate, posterior oropharyngeal edema or posterior oropharyngeal erythema.  Eyes: Conjunctivae and EOM are normal. Pupils are equal, round, and reactive to light.  Neck: Normal range of motion. Neck supple.  Cardiovascular: Normal rate, regular rhythm, normal heart sounds and intact distal pulses.  Exam reveals no gallop and no friction rub.   No murmur heard. Pulmonary/Chest: Effort normal and breath sounds normal.  Abdominal: Soft. Bowel sounds are normal. He exhibits no distension. There is no tenderness.  Musculoskeletal: Normal range of motion. He exhibits no edema and no tenderness.  Neurological: He is alert and oriented to person, place, and time. No cranial nerve deficit.  Skin: Skin is warm and dry. He is not diaphoretic.  Psychiatric: He has a normal mood and affect.    ED Course  Procedures (including critical care time)  Labs Reviewed  CBC WITH DIFFERENTIAL - Abnormal; Notable for the following:    RBC 4.19 (*)    HCT 36.8 (*)    MCHC 36.7 (*)    Neutrophils Relative 87 (*)     Lymphocytes Relative 4 (*)    Lymphs Abs 0.3 (*)    All other components within normal limits  BASIC METABOLIC PANEL - Abnormal; Notable for the following:    Glucose, Bld 136 (*)    GFR calc non Af Amer 77 (*)    GFR calc Af Amer 89 (*)    All other components within normal limits  URINALYSIS,  ROUTINE W REFLEX MICROSCOPIC   Dg Chest 2 View  12/14/2012  *RADIOLOGY REPORT*  Clinical Data: Chest pain with loss of consciousness; nausea  CHEST - 2 VIEW  Comparison:  Chest CT March 17, 2012  Findings: Lungs clear.  Heart size and pulmonary vascularity are normal.  No adenopathy.  No bone lesions. No pneumothorax.  IMPRESSION: No edema or consolidation.   Original Report Authenticated By: Bretta Bang, M.D.      Date: 12/14/2012  Rate: 84  Rhythm: normal sinus rhythm  QRS Axis: left  Intervals: normal  ST/T Wave abnormalities: normal  Conduction Disutrbances:left anterior fascicular block  Narrative Interpretation:   Old EKG Reviewed: unchanged from EKG taken at Urgent Care PTA    1. Syncope   2. Viral syndrome   3. Dehydration   4. Fever       MDM   83 y M with PMH of AML (remission, no current meds), HTN, HLD, CLBP, chronic anemia here for eval after being referred to the ED by Urgent Care after referral from Urgent Care for eval 2/2 a syncopal event.  Pt reports vague illness since last night (low grade fevers 100.5, myalgia, mild achy HA, congestion, mild cough, 1 loose BM today).  He laid around all day today and then stood up to walk across the house, at which time he became lightheaded and nauseated and nearly "passed out".  He sat on the couch for a few minutes and then his wife took him out to the car so to take him to UC.  After he got in the car he had an episode of unresponsiveness that lasted 15-20 seconds.  No shaking or bowel/bladder incontinence.  No hx of seizures.  No photophobia, neck pain, abd pain, neck stiffness, phonophobia, back pain (above chronic BP),  dysuria.  His wife said that he appeared to be "choking on his spit".  No ticks.  Pt ate sushi last night.  Mild fever here, well appearing, NAD, not diaphoretic. VSS and WNLs.  Lungs clear.  No meningeal signs.  Abd soft, NT.  No edema/rash.     Diff Dx: Viral syndrome, CNS infection (doubt, no meningeal signs, well appearing), PNA.  CBC, BMP, CXR, EKG.  6:50 PM Pt has received 2 NS boluses and states that he feels back to his baseline and is ready for d/c home.  Workup and repeat exam unremarkable.  Likely orthostatic syncope in setting of viral syndrome/dehydration.  Return precautions reviewed.  It is felt the pt is stable for d/c with close PCP f/u.  All questions answered and patient expressed understanding. BP 126/78  Pulse 81  Temp(Src) 99.2 F (37.3 C) (Oral)  Resp 18  SpO2 98%   Disposition: Discharge  Condition: Good  Follow-up Information   Schedule an appointment as soon as possible for a visit with Judie Petit, MD.   Contact information:   [address] [place] [address] [phone]        Pt seen in conjunction with my attending, Dr. Bebe Shaggy.   Oleh Genin, MD PGY-II [facility] Emergency Medicine Resident        Oleh Genin, MD 12/15/12 1155

## 2012-12-14 NOTE — ED Notes (Signed)
To x-ray

## 2012-12-14 NOTE — ED Notes (Signed)
Pt c/o  Just feeling weak with no other symptoms

## 2012-12-14 NOTE — ED Notes (Signed)
Per EMS- pt had 2 syncopal episodes today that were witnessed. Pt began feeling weak, febrile, and chills. Pt was sent from PCP for eval. Pt took 4 advil this morning. Pt reported 2 episodes of chest pain that were dull 2/10. Pt BP 102/64 at PCP. 18g in LAC. NS en route. 115/69. HR 83.

## 2012-12-14 NOTE — ED Notes (Signed)
Pt states that prior to syncopal episode he felt weak, lightheaded adn had tingling in his leg. Pt states that he only had 1 syncopal episode and that he was able to sit down before he had a second.

## 2012-12-16 ENCOUNTER — Telehealth: Payer: Self-pay | Admitting: Internal Medicine

## 2012-12-16 NOTE — ED Provider Notes (Signed)
I have personally seen and examined the patient.  I have discussed the plan of care with the resident.  I have reviewed the documentation on PMH/FH/Soc. History.  I have reviewed the documentation of the resident and agree.  I have reviewed and agree with the ECG interpretation(s) documented by the resident.   Joya Gaskins, MD 12/16/12 [phone]

## 2012-12-16 NOTE — Telephone Encounter (Signed)
Doug called from [phone] to ask if should continue taking Amlodipine 10 mg daily.  Diagnosed with viral syndrome and dehydration in ED 12/14/12 with symptoms of fever, body aches, diarrhea and syncope. Lost 5 lbs. BP in ED  92/60.  Reports got up this morning and then laid down again to check BP.  BP 113/72.  Drinking fluids. Appetite decreased.  No diarrhea since 12/15/12.  Scheduled for ED follow up visit for 12/17/12 at 1030 with Lubertha Sayres, NP.  Currently at work. Please call back to advise if should skip Amlodipine dose this morning, 12/16/12. Requested message be left on [phone] answering machine if he does not answer his cell phone.

## 2012-12-16 NOTE — Telephone Encounter (Signed)
Skip Amlodipine today. Drink plenty of fluids, water.Marland Kitchen

## 2012-12-16 NOTE — Telephone Encounter (Signed)
Pt aware.

## 2012-12-17 ENCOUNTER — Ambulatory Visit (INDEPENDENT_AMBULATORY_CARE_PROVIDER_SITE_OTHER): Payer: BC Managed Care – PPO | Admitting: Family

## 2012-12-17 ENCOUNTER — Encounter: Payer: Self-pay | Admitting: Family

## 2012-12-17 VITALS — BP 110/80 | HR 69 | Wt 176.0 lb

## 2012-12-17 DIAGNOSIS — E860 Dehydration: Secondary | ICD-10-CM

## 2012-12-17 DIAGNOSIS — I959 Hypotension, unspecified: Secondary | ICD-10-CM

## 2012-12-17 DIAGNOSIS — R55 Syncope and collapse: Secondary | ICD-10-CM

## 2012-12-17 NOTE — Patient Instructions (Addendum)
1. If your blood pressure begins to trend in the 130's/high 80's-90's resume Norvasc at 5mg .   Dehydration, Adult Dehydration is when you lose more fluids from the body than you take in. Vital organs like the kidneys, brain, and heart cannot function without a proper amount of fluids and salt. Any loss of fluids from the body can cause dehydration.  CAUSES   Vomiting.  Diarrhea.  Excessive sweating.  Excessive urine output.  Fever. SYMPTOMS  Mild dehydration  Thirst.  Dry lips.  Slightly dry mouth. Moderate dehydration  Very dry mouth.  Sunken eyes.  Skin does not bounce back quickly when lightly pinched and released.  Dark urine and decreased urine production.  Decreased tear production.  Headache. Severe dehydration  Very dry mouth.  Extreme thirst.  Rapid, weak pulse (more than 100 beats per minute at rest).  Cold hands and feet.  Not able to sweat in spite of heat and temperature.  Rapid breathing.  Blue lips.  Confusion and lethargy.  Difficulty being awakened.  Minimal urine production.  No tears. DIAGNOSIS  Your caregiver will diagnose dehydration based on your symptoms and your exam. Blood and urine tests will help confirm the diagnosis. The diagnostic evaluation should also identify the cause of dehydration. TREATMENT  Treatment of mild or moderate dehydration can often be done at home by increasing the amount of fluids that you drink. It is best to drink small amounts of fluid more often. Drinking too much at one time can make vomiting worse. Refer to the home care instructions below. Severe dehydration needs to be treated at the hospital where you will probably be given intravenous (IV) fluids that contain water and electrolytes. HOME CARE INSTRUCTIONS   Ask your caregiver about specific rehydration instructions.  Drink enough fluids to keep your urine clear or pale yellow.  Drink small amounts frequently if you have nausea and  vomiting.  Eat as you normally do.  Avoid:  Foods or drinks high in sugar.  Carbonated drinks.  Juice.  Extremely hot or cold fluids.  Drinks with caffeine.  Fatty, greasy foods.  Alcohol.  Tobacco.  Overeating.  Gelatin desserts.  Wash your hands well to avoid spreading bacteria and viruses.  Only take over-the-counter or prescription medicines for pain, discomfort, or fever as directed by your caregiver.  Ask your caregiver if you should continue all prescribed and over-the-counter medicines.  Keep all follow-up appointments with your caregiver. SEEK MEDICAL CARE IF:  You have abdominal pain and it increases or stays in one area (localizes).  You have a rash, stiff neck, or severe headache.  You are irritable, sleepy, or difficult to awaken.  You are weak, dizzy, or extremely thirsty. SEEK IMMEDIATE MEDICAL CARE IF:   You are unable to keep fluids down or you get worse despite treatment.  You have frequent episodes of vomiting or diarrhea.  You have blood or green matter (bile) in your vomit.  You have blood in your stool or your stool looks black and tarry.  You have not urinated in 6 to 8 hours, or you have only urinated a small amount of very dark urine.  You have a fever.  You faint. MAKE SURE YOU:   Understand these instructions.  Will watch your condition.  Will get help right away if you are not doing well or get worse. Document Released: 07/28/2005 Document Revised: 10/20/2011 Document Reviewed: 03/17/2011 [facility] Patient Information 2013 [place], [place].

## 2012-12-17 NOTE — Progress Notes (Signed)
Subjective:    Patient ID: [name], male    DOB: 1955-09-01, 57 y.o.   MRN: [id number]  HPI 57 year old white male, nonsmoker, patient of Dr. Cato Mulligan is in as an emergency department followup. He was seen in the emergency department on Monday after a syncopal episode at home. Patient reports waking up that morning feeling dizzy and tired. He later decided that he was diagnosed with urgent care clinic. His wife with him in the car to go to the urgent care and reports he passed out and began to foam at the mouth. He proceeded to the urgent care clinic later sent him to the emergency department. Reports receiving 2 L of fluid after determine he was dehydrated and began to do so much better. His blood pressure was found to be hypotensive in the 90s systolically. Since his discharge, he does continue to have blood pressures in the low 110s over 80. Denies any lightheadedness or dizziness. He's been holding amlodipine 10 mg at this point.   Review of Systems  Constitutional: Negative.   HENT: Negative.   Eyes: Negative.   Respiratory: Negative.   Cardiovascular: Negative.   Genitourinary: Negative.   Musculoskeletal: Negative.   Skin: Negative.   Allergic/Immunologic: Negative.   Neurological: Negative.   Hematological: Negative.   Psychiatric/Behavioral: Negative.    Past Medical History  Diagnosis Date  . Anemia   . Esophageal reflux   . Diverticulosis of colon (without mention of hemorrhage)   . Family hx colonic polyps   . Hiatal hernia   . Psychosexual dysfunction with inhibited sexual excitement   . Hypertension   . Hyperlipemia   . Acute myeloid leukemia in remission 1999  . History of kidney stones   . Depression   . Arthritis     History   Social History  . Marital Status: Married    Spouse Name: N/A    Number of Children: 2  . Years of Education: N/A   Occupational History  . self employed    Social History Main Topics  . Smoking status: Never Smoker   .  Smokeless tobacco: Never Used  . Alcohol Use: Yes     Comment: one occ   . Drug Use: No  . Sexually Active: No   Other Topics Concern  . Not on file   Social History Narrative   2 caffeine drinks daily     Past Surgical History  Procedure Laterality Date  . Shoulder surgery      x 3     Family History  Problem Relation Age of Onset  . Hypertension Father   . Heart disease Father   . Prostate cancer Father     dx late 30s  . Cancer Father 9    prostate ca  . Celiac disease Mother   . Colon polyps Mother   . Breast cancer Mother   . Cancer Mother     Bladder  . Colon cancer Neg Hx     Allergies  Allergen Reactions  . Marinol (Dronabinol)     Delusions.    Current Outpatient Prescriptions on File Prior to Visit  Medication Sig Dispense Refill  . amLODipine (NORVASC) 10 MG tablet Take 10 mg by mouth daily.      . cyanocobalamin 500 MCG tablet Take 500 mcg by mouth daily.      Marland Kitchen EPIPEN 2-PAK 0.3 MG/0.3ML DEVI Inject 0.3 mg into the muscle as needed.       Marland Kitchen ibuprofen (ADVIL,MOTRIN)  100 MG tablet Take 800 mg by mouth every 8 (eight) hours as needed for pain.       Marland Kitchen ibuprofen (ADVIL,MOTRIN) 200 MG tablet Take 800 mg by mouth every 6 (six) hours as needed for pain.      . Nutritional Supplements (JUICE PLUS FIBRE PO) Take 2 capsules by mouth 2 (two) times daily.      Marland Kitchen omeprazole (PRILOSEC OTC) 20 MG tablet Take 20 mg by mouth daily.        . saw palmetto 160 MG capsule Take 160 mg by mouth daily.        . sertraline (ZOLOFT) 100 MG tablet Take 50 mg by mouth daily.      . sildenafil (VIAGRA) 100 MG tablet Take 50 mg by mouth as needed for erectile dysfunction.       . valACYclovir (VALTREX) 1000 MG tablet TAKE 1 TABLET BY MOUTH EVERY DAY  30 tablet  2   No current facility-administered medications on file prior to visit.    BP 110/80  Pulse 69  Wt 176 lb (79.833 kg)  BMI 24.56 kg/m2  SpO2 98%chart    Objective:   Physical Exam  Constitutional: He is  oriented to person, place, and time. He appears well-developed and well-nourished.  Eyes: Conjunctivae are normal. Pupils are equal, round, and reactive to light.  Neck: Normal range of motion. Neck supple.  Cardiovascular: Normal rate, regular rhythm and normal heart sounds.   Pulmonary/Chest: Effort normal and breath sounds normal.  Abdominal: Soft. Bowel sounds are normal.  Neurological: He is alert and oriented to person, place, and time.  Skin: Skin is warm and dry.  Skin turgor normal  Psychiatric: He has a normal mood and affect.          Assessment & Plan:  Assessment:  1. Dehydration 2. Syncopal episode 3. Hypotension related to a hydration 4. Hypertension  Plan: We will continue to hold amlodipine. Patient will monitor his blood pressure twice a day and call with blood pressure readings. I've advised that if his blood pressure trends in the 130s over mid 80s to 90s that will resume his amlodipine and one half tablet, 5 mg, once daily and call the office and let us know. Encouraged him to increase his fluid intake. Discussed signs and symptoms of dehydration. Recheck via mychart and sooner as needed.

## 2013-01-12 ENCOUNTER — Other Ambulatory Visit: Payer: Self-pay | Admitting: *Deleted

## 2013-01-12 MED ORDER — AMLODIPINE BESYLATE 10 MG PO TABS: 10.0000 mg | ORAL_TABLET | Freq: Every day | ORAL | Status: DC

## 2013-02-16 ENCOUNTER — Other Ambulatory Visit: Payer: Self-pay | Admitting: Internal Medicine

## 2013-03-08 ENCOUNTER — Other Ambulatory Visit: Payer: Self-pay | Admitting: *Deleted

## 2013-03-08 MED ORDER — VALACYCLOVIR HCL 1 G PO TABS: ORAL_TABLET | ORAL | Status: DC

## 2013-03-08 MED ORDER — AMLODIPINE BESYLATE 10 MG PO TABS: 10.0000 mg | ORAL_TABLET | Freq: Every day | ORAL | Status: DC

## 2013-03-24 ENCOUNTER — Encounter: Payer: Self-pay | Admitting: Family Medicine

## 2013-03-24 ENCOUNTER — Ambulatory Visit (INDEPENDENT_AMBULATORY_CARE_PROVIDER_SITE_OTHER): Payer: BC Managed Care – PPO | Admitting: Family Medicine

## 2013-03-24 VITALS — BP 130/86 | Temp 97.8°F | Wt 175.0 lb

## 2013-03-24 DIAGNOSIS — H6122 Impacted cerumen, left ear: Secondary | ICD-10-CM

## 2013-03-24 DIAGNOSIS — H60399 Other infective otitis externa, unspecified ear: Secondary | ICD-10-CM

## 2013-03-24 DIAGNOSIS — H60392 Other infective otitis externa, left ear: Secondary | ICD-10-CM

## 2013-03-24 DIAGNOSIS — H6120 Impacted cerumen, unspecified ear: Secondary | ICD-10-CM

## 2013-03-24 MED ORDER — CIPROFLOXACIN-DEXAMETHASONE 0.3-0.1 % OT SUSP: 4.0000 [drp] | Freq: Two times a day (BID) | OTIC | Status: DC

## 2013-03-24 NOTE — Progress Notes (Signed)
Chief Complaint  Patient presents with  . Otalgia    HPI:  Acute visit for L ear pain: -got water in ear in shower a few weeks ago -started hurting a few days ago -denies: fevers, drainage, hearing loss -does have allergies, sees allergist, chronic nasal congestion  ROS: See pertinent positives and negatives per HPI.  Past Medical History  Diagnosis Date  . Anemia   . Esophageal reflux   . Diverticulosis of colon (without mention of hemorrhage)   . Family hx colonic polyps   . Hiatal hernia   . Psychosexual dysfunction with inhibited sexual excitement   . Hypertension   . Hyperlipemia   . Acute myeloid leukemia in remission 1999  . History of kidney stones   . Depression   . Arthritis     Family History  Problem Relation Age of Onset  . Hypertension Father   . Heart disease Father   . Prostate cancer Father     dx late 55s  . Cancer Father 29    prostate ca  . Celiac disease Mother   . Colon polyps Mother   . Breast cancer Mother   . Cancer Mother     Bladder  . Colon cancer Neg Hx     History   Social History  . Marital Status: Married    Spouse Name: N/A    Number of Children: 2  . Years of Education: N/A   Occupational History  . self employed    Social History Main Topics  . Smoking status: Never Smoker   . Smokeless tobacco: Never Used  . Alcohol Use: Yes     Comment: one occ   . Drug Use: No  . Sexual Activity: No   Other Topics Concern  . None   Social History Narrative   2 caffeine drinks daily     Current outpatient prescriptions:amLODipine (NORVASC) 10 MG tablet, Take 1 tablet (10 mg total) by mouth daily., Disp: 90 tablet, Rfl: 2;  cyanocobalamin 500 MCG tablet, Take 500 mcg by mouth daily., Disp: , Rfl: ;  EPIPEN 2-PAK 0.3 MG/0.3ML DEVI, Inject 0.3 mg into the muscle as needed. , Disp: , Rfl: ;  ibuprofen (ADVIL,MOTRIN) 100 MG tablet, Take 800 mg by mouth every 8 (eight) hours as needed for pain. , Disp: , Rfl:  ibuprofen  (ADVIL,MOTRIN) 200 MG tablet, Take 800 mg by mouth every 6 (six) hours as needed for pain., Disp: , Rfl: ;  Nutritional Supplements (JUICE PLUS FIBRE PO), Take 2 capsules by mouth 2 (two) times daily., Disp: , Rfl: ;  omeprazole (PRILOSEC OTC) 20 MG tablet, Take 20 mg by mouth daily.  , Disp: , Rfl: ;  saw palmetto 160 MG capsule, Take 160 mg by mouth daily.  , Disp: , Rfl:  sertraline (ZOLOFT) 100 MG tablet, Take 50 mg by mouth daily., Disp: , Rfl: ;  sertraline (ZOLOFT) 100 MG tablet, TAKE 1/2 TABLET BY MOUTH EVERY DAY, Disp: 90 tablet, Rfl: 2;  sildenafil (VIAGRA) 100 MG tablet, Take 50 mg by mouth as needed for erectile dysfunction. , Disp: , Rfl: ;  valACYclovir (VALTREX) 1000 MG tablet, TAKE 1 TABLET BY MOUTH EVERY DAY, Disp: 30 tablet, Rfl: 5 ciprofloxacin-dexamethasone (CIPRODEX) otic suspension, Place 4 drops into the left ear 2 (two) times daily. For 1 week, Disp: 7.5 mL, Rfl: 0  EXAM:  Filed Vitals:   03/24/13 1548  BP: 130/86  Temp: 97.8 F (36.6 C)    Body mass index is 24.42  kg/(m^2).  GENERAL: vitals reviewed and listed above, alert, oriented, appears well hydrated and in no acute distress  HEENT: atraumatic, conjunttiva clear, no obvious abnormalities on inspection of external nose and ears, normal appearance of ear canals and TMs except for L ear with some cerumen in exterior ear canal, once removed mild erythema/swelling of canal, pain with tugging on tragus, no mastoid TTP clear nasal congestion, mild post oropharyngeal erythema with PND, no tonsillar edema or exudate, no sinus TTP  NECK: no obvious masses on inspection  LUNGS: clear to auscultation bilaterally, no wheezes, rales or rhonchi, good air movement  CV: HRRR, no peripheral edema  MS: moves all extremities without noticeable abnormality  PSYCH: pleasant and cooperative, no obvious depression or anxiety  ASSESSMENT AND PLAN:  Discussed the following assessment and plan:  Cerumen impaction, left - removed  with soft curette  Otitis, externa, infective, left - Plan: ciprofloxacin-dexamethasone (CIPRODEX) otic suspension -risks/return precautions  -Patient advised to return or notify a doctor immediately if symptoms worsen or persist or new concerns arise.  There are no Patient Instructions on file for this visit.   Kriste Basque R.

## 2013-04-04 ENCOUNTER — Other Ambulatory Visit: Payer: Self-pay | Admitting: *Deleted

## 2013-04-04 MED ORDER — FLUTICASONE PROPIONATE 50 MCG/ACT NA SUSP: 1.0000 | Freq: Every day | NASAL | Status: DC

## 2013-04-14 ENCOUNTER — Other Ambulatory Visit: Payer: Self-pay | Admitting: *Deleted

## 2013-04-14 MED ORDER — FLUTICASONE PROPIONATE 50 MCG/ACT NA SUSP: 1.0000 | Freq: Every day | NASAL | Status: DC

## 2013-04-26 ENCOUNTER — Telehealth (HOSPITAL_COMMUNITY): Payer: Self-pay | Admitting: Emergency Medicine

## 2013-04-26 NOTE — Telephone Encounter (Signed)
[name] was having some trouble getting documentation of the various modalities he used to heal from his injuries. I refreshed his memory on our last conversation in January of this year where I recommended PT.

## 2013-05-03 ENCOUNTER — Ambulatory Visit (INDEPENDENT_AMBULATORY_CARE_PROVIDER_SITE_OTHER): Payer: BC Managed Care – PPO

## 2013-05-03 DIAGNOSIS — Z23 Encounter for immunization: Secondary | ICD-10-CM

## 2013-05-16 ENCOUNTER — Encounter: Payer: Self-pay | Admitting: Internal Medicine

## 2013-05-16 ENCOUNTER — Telehealth: Payer: Self-pay | Admitting: Internal Medicine

## 2013-05-16 NOTE — Telephone Encounter (Signed)
Patient Information:  Caller Name: Bessie  Phone: [phone]  Patient: [name], [name]  Gender: Male  DOB: [birth year]  Age: 57 Years  PCP: Birdie Sons (Adults only)  Office Follow Up:  Does the office need to follow up with this patient?: No  Instructions For The Office: N/A  RN Note:  Hx of cancer and bone marrow transplant 1999.  Declined to be seen at [facility] 05/16/13; appointment scheduled for 05/17/13 per caller request.  Symptoms  Reason For Call & Symptoms: Recurrent dull, left ear pain.  Painful to touch tragus.  Diagnosed with otitis externa 03/24/13.  Used Ciprodex with improvement but symptoms returned 3 days after stopped using ear drops.  Reviewed Health History In EMR: Yes  Reviewed Medications In EMR: Yes  Reviewed Allergies In EMR: Yes  Reviewed Surgeries / Procedures: Yes  Date of Onset of Symptoms: 05/06/2013  Treatments Tried: Ciprodex  Treatments Tried Worked: Yes  Guideline(s) Used:  Ear - Swimmer's - Otitis Externa  Disposition Per Guideline:   Go to Office Now  Reason For Disposition Reached:   Diabetes mellitus or a weak immune system (e.g., HIV positive, cancer chemotherapy, transplant patient)  Advice Given:  Pain Medicines:  Ibuprofen (e.g., Motrin, Advil):  Take 400 mg (two 200 mg pills) by mouth every 6 hours.  Another choice is to take 600 mg (three 200 mg pills) by mouth every 8 hours.  The most you should take each day is 1,200 mg (six 200 mg pills), unless your doctor has told you to take more.  Avoid Swimming:  Try to avoid swimming until symptoms are gone.  Call Back If:  Ear symptoms last longer than 7 days with treatment  You become worse.  RN Overrode Recommendation:  Make Appointment  Wants to be seen in office.  Appointment Scheduled:  05/17/2013 09:30:00 Appointment Scheduled Provider:  Gershon Crane [facility])

## 2013-05-17 ENCOUNTER — Encounter: Payer: Self-pay | Admitting: Family Medicine

## 2013-05-17 ENCOUNTER — Other Ambulatory Visit: Payer: Self-pay | Admitting: Family

## 2013-05-17 ENCOUNTER — Ambulatory Visit (INDEPENDENT_AMBULATORY_CARE_PROVIDER_SITE_OTHER): Payer: BC Managed Care – PPO | Admitting: Family Medicine

## 2013-05-17 VITALS — BP 130/80 | HR 74 | Temp 97.6°F | Wt 175.0 lb

## 2013-05-17 DIAGNOSIS — M26629 Arthralgia of temporomandibular joint, unspecified side: Secondary | ICD-10-CM

## 2013-05-17 DIAGNOSIS — M2669 Other specified disorders of temporomandibular joint: Secondary | ICD-10-CM

## 2013-05-17 DIAGNOSIS — H9201 Otalgia, right ear: Secondary | ICD-10-CM

## 2013-05-17 MED ORDER — DICLOFENAC SODIUM 75 MG PO TBEC
75.0000 mg | DELAYED_RELEASE_TABLET | Freq: Two times a day (BID) | ORAL | Status: DC | PRN
Start: 2013-05-17 — End: 2013-06-21

## 2013-05-17 NOTE — Progress Notes (Signed)
  Subjective:    Patient ID: [name], male    DOB: 10-Oct-1955, 57 y.o.   MRN: [id number]  HPI Here for recurrent left ear pain. He was seen last August for this and it was felt he may have had an external otitis. He was given Ciprodex drops to use , and the pain resolved. Now over the past 3 weeks it has returned. He points to areas just in front of and just behind the ear as the sources of pain. No hearing issues. Using the ear drops but this time they are not helping. He does get a little relief from Advil. On further questioning he admits to a hx of TMJ pain, and he has worn a nighttime mouth guard for years. He says he has been under a lot of stress for the past month or so as he and his wife are selling one home, building a new home, and currently living in an apartment. He sleeps well.    Review of Systems  Constitutional: Negative.   HENT: Positive for ear pain. Negative for hearing loss, congestion, rhinorrhea, neck pain, neck stiffness, postnasal drip, sinus pressure, tinnitus and ear discharge.   Eyes: Negative.   Respiratory: Negative.        Objective:   Physical Exam  Constitutional: He appears well-developed and well-nourished. No distress.  HENT:  Head: Normocephalic and atraumatic.  Right Ear: External ear normal.  Left Ear: External ear normal.  Nose: Nose normal.  Mouth/Throat: Oropharynx is clear and moist. No oropharyngeal exudate.  Mildly tender over the left TMJ, no crepitus  Eyes: Conjunctivae are normal.  Lymphadenopathy:    He has no cervical adenopathy.          Assessment & Plan:  This is TMJ pain, so he can stop using the drops. Get on Diclofenac bid. I advised him to see his dentist to check his bite and his mouthguard.

## 2013-05-17 NOTE — Telephone Encounter (Signed)
Noted  

## 2013-05-18 ENCOUNTER — Telehealth: Payer: Self-pay | Admitting: Internal Medicine

## 2013-05-18 NOTE — Telephone Encounter (Signed)
Pt is requesting a refill on sildenafil (VIAGRA) 100 MG tablet [[id number]] but is requesting a written script be mailed to his home. Pt states he has to mail this prescription off.

## 2013-05-19 MED ORDER — SILDENAFIL CITRATE 100 MG PO TABS: 50.0000 mg | ORAL_TABLET | ORAL | Status: DC | PRN

## 2013-05-19 NOTE — Telephone Encounter (Signed)
rx mailed to pt, pt aware

## 2013-06-16 ENCOUNTER — Encounter: Payer: Self-pay | Admitting: Internal Medicine

## 2013-06-16 ENCOUNTER — Ambulatory Visit (INDEPENDENT_AMBULATORY_CARE_PROVIDER_SITE_OTHER): Payer: BC Managed Care – PPO | Admitting: Internal Medicine

## 2013-06-16 VITALS — BP 140/84 | HR 98 | Temp 98.6°F | Ht 70.0 in | Wt 172.5 lb

## 2013-06-16 DIAGNOSIS — I10 Essential (primary) hypertension: Secondary | ICD-10-CM

## 2013-06-16 DIAGNOSIS — R7301 Impaired fasting glucose: Secondary | ICD-10-CM

## 2013-06-16 DIAGNOSIS — B349 Viral infection, unspecified: Secondary | ICD-10-CM | POA: Insufficient documentation

## 2013-06-16 DIAGNOSIS — B9789 Other viral agents as the cause of diseases classified elsewhere: Secondary | ICD-10-CM

## 2013-06-16 MED ORDER — PROMETHAZINE HCL 25 MG PO TABS: 25.0000 mg | ORAL_TABLET | Freq: Four times a day (QID) | ORAL | Status: DC | PRN

## 2013-06-16 NOTE — Progress Notes (Signed)
Pre visit review using our clinic review tool, if applicable. No additional management support is needed unless otherwise documented below in the visit note. 

## 2013-06-16 NOTE — Progress Notes (Signed)
Subjective:    Patient ID: [name], male    DOB: August 14, 1955, 57 y.o.   MRN: [id number]  HPI  Here with 2 days acute onset low grade temp, general weakness and malaise, nausea, abd crampy pains and recurring loose stools. No high fever, chills, vomiting, orthostasis, HA, ST, cough and ,Pt denies chest pain, increased sob or doe, wheezing, orthopnea, PND, increased LE swelling, palpitations, dizziness or syncope.  Pt denies polydipsia, polyuria,   Pt states overall good compliance with meds,  Past Medical History  Diagnosis Date  . Anemia   . Esophageal reflux   . Diverticulosis of colon (without mention of hemorrhage)   . Family hx colonic polyps   . Hiatal hernia   . Psychosexual dysfunction with inhibited sexual excitement   . Hypertension   . Hyperlipemia   . Acute myeloid leukemia in remission 1999  . History of kidney stones   . Depression   . Arthritis    Past Surgical History  Procedure Laterality Date  . Shoulder surgery      x 3     reports that he has never smoked. He has never used smokeless tobacco. He reports that he drinks alcohol. He reports that he does not use illicit drugs. family history includes Breast cancer in his mother; Cancer in his mother; Cancer (age of onset: 60) in his father; Celiac disease in his mother; Colon polyps in his mother; Heart disease in his father; Hypertension in his father; Prostate cancer in his father. There is no history of Colon cancer. Allergies  Allergen Reactions  . Marinol [Dronabinol]     Delusions.   Current Outpatient Prescriptions on File Prior to Visit  Medication Sig Dispense Refill  . amLODipine (NORVASC) 10 MG tablet Take 1 tablet (10 mg total) by mouth daily.  90 tablet  2  . cyanocobalamin 500 MCG tablet Take 500 mcg by mouth daily.      Marland Kitchen EPIPEN 2-PAK 0.3 MG/0.3ML DEVI Inject 0.3 mg into the muscle as needed.       . fluticasone (FLONASE) 50 MCG/ACT nasal spray Place 1 spray into the nose daily.  16 g  5  .  ibuprofen (ADVIL,MOTRIN) 200 MG tablet Take 800 mg by mouth every 6 (six) hours as needed for pain.      . Nutritional Supplements (JUICE PLUS FIBRE PO) Take 2 capsules by mouth 2 (two) times daily.      Marland Kitchen omeprazole (PRILOSEC OTC) 20 MG tablet Take 20 mg by mouth daily.        . saw palmetto 160 MG capsule Take 160 mg by mouth daily.        . sertraline (ZOLOFT) 100 MG tablet Take 50 mg by mouth daily.      . sertraline (ZOLOFT) 100 MG tablet TAKE 1/2 TABLET BY MOUTH EVERY DAY  90 tablet  2  . sildenafil (VIAGRA) 100 MG tablet Take 0.5 tablets (50 mg total) by mouth as needed for erectile dysfunction.  10 tablet  1  . valACYclovir (VALTREX) 1000 MG tablet TAKE 1 TABLET BY MOUTH EVERY DAY  30 tablet  5  . diclofenac (VOLTAREN) 75 MG EC tablet Take 1 tablet (75 mg total) by mouth 2 (two) times daily as needed (pain).  60 tablet  2   No current facility-administered medications on file prior to visit.   Review of Systems  Constitutional: Negative for unexpected weight change, or unusual diaphoresis  HENT: Negative for tinnitus.   Eyes: Negative  for photophobia and visual disturbance.  Respiratory: Negative for choking and stridor.   Gastrointestinal: Negative for vomiting and blood in stool.  Genitourinary: Negative for hematuria and decreased urine volume.  Musculoskeletal: Negative for acute joint swelling Skin: Negative for color change and wound.  Neurological: Negative for tremors and numbness other than noted  Psychiatric/Behavioral: Negative for decreased concentration or  hyperactivity.       Objective:   Physical Exam BP 140/84  Pulse 98  Temp(Src) 98.6 F (37 C) (Oral)  Ht 5\' 10"  (1.778 m)  Wt 172 lb 8 oz (78.245 kg)  BMI 24.75 kg/m2  SpO2 99% VS noted, mild ill appearing Constitutional: Pt appears well-developed and well-nourished.  HENT: Head: NCAT.  Right Ear: External ear normal.  Left Ear: External ear normal.  Eyes: Conjunctivae and EOM are normal. Pupils are  equal, round, and reactive to light.  Neck: Normal range of motion. Neck supple.  Cardiovascular: Normal rate and regular rhythm.   Pulmonary/Chest: Effort normal and breath sounds normal.  Abd:  Soft, NT, non-distended, + BS - benign exam Neurological: Pt is alert. Not confused  Skin: Skin is warm. No erythema.  Psychiatric: Pt behavior is normal. Thought content normal.         Assessment & Plan:

## 2013-06-16 NOTE — Assessment & Plan Note (Signed)
stable overall by history and exam, recent data reviewed with pt, and pt to continue medical treatment as before,  to f/u any worsening symptoms or concerns BP Readings from Last 3 Encounters:  06/16/13 140/84  05/17/13 130/80  03/24/13 130/86

## 2013-06-16 NOTE — Assessment & Plan Note (Signed)
Hx and exam c/w prob viral AGE, for immodium prn, phenergan prn, fluids, tylenol, f/u any worsening s/s

## 2013-06-16 NOTE — Assessment & Plan Note (Signed)
Asympt, cont to monitor for s/s elev glc with illness stress,  to f/u any worsening symptoms or concerns Lab Results  Component Value Date   HGBA1C 5.9 02/21/2010

## 2013-06-16 NOTE — Patient Instructions (Signed)
Please take all new medication as prescribed - the phenergan if needed Please continue all other medications as before, and refills have been done if requested. You should also consider taking Immodium AD as needed for diarrhea Please drink plenty of fluids, as well as tylenol as needed for pain  Please remember to sign up for My Chart if you have not done so, as this will be important to you in the future with finding out test results, communicating by private email, and scheduling acute appointments online when needed.

## 2013-06-21 ENCOUNTER — Encounter: Payer: Self-pay | Admitting: Internal Medicine

## 2013-06-21 ENCOUNTER — Ambulatory Visit (INDEPENDENT_AMBULATORY_CARE_PROVIDER_SITE_OTHER): Payer: BC Managed Care – PPO | Admitting: Internal Medicine

## 2013-06-21 VITALS — BP 146/90 | HR 83 | Temp 98.5°F | Resp 20 | Wt 178.0 lb

## 2013-06-21 DIAGNOSIS — I10 Essential (primary) hypertension: Secondary | ICD-10-CM

## 2013-06-21 DIAGNOSIS — E785 Hyperlipidemia, unspecified: Secondary | ICD-10-CM

## 2013-06-21 NOTE — Progress Notes (Signed)
Subjective:    Patient ID: [name], male    DOB: [birth year], 57 y.o.   MRN: [id number]  HPI Pre-visit discussion using our clinic review tool. No additional management support is needed unless otherwise documented below in the visit note.  57 year old patient who has a history of benign essential hypertension controlled on amlodipine.  5 days ago he developed an acute febrile illness with nausea diarrhea fatigue and myalgias.  Following a office visit he became quite weak and lightheaded and had a syncopal episode while driving. When he awoke he was pale and diaphoretic.  EMS was called to the scene and EKG was normal and random blood sugar was 1:30. In early May he also had a recent episode in the setting of mild dehydration.  The following day he felt normal and he has resumed all usual activities including his exercise regimen.  Past Medical History  Diagnosis Date  . Anemia   . Esophageal reflux   . Diverticulosis of colon (without mention of hemorrhage)   . Family hx colonic polyps   . Hiatal hernia   . Psychosexual dysfunction with inhibited sexual excitement   . Hypertension   . Hyperlipemia   . Acute myeloid leukemia in remission 1999  . History of kidney stones   . Depression   . Arthritis     History   Social History  . Marital Status: Married    Spouse Name: N/A    Number of Children: 2  . Years of Education: N/A   Occupational History  . self employed    Social History Main Topics  . Smoking status: Never Smoker   . Smokeless tobacco: Never Used  . Alcohol Use: Yes     Comment: one occ   . Drug Use: No  . Sexual Activity: No   Other Topics Concern  . Not on file   Social History Narrative   2 caffeine drinks daily     Past Surgical History  Procedure Laterality Date  . Shoulder surgery      x 3     Family History  Problem Relation Age of Onset  . Hypertension Father   . Heart disease Father   . Prostate cancer Father     dx late 45s   . Cancer Father 39    prostate ca  . Celiac disease Mother   . Colon polyps Mother   . Breast cancer Mother   . Cancer Mother     Bladder  . Colon cancer Neg Hx     Allergies  Allergen Reactions  . Marinol [Dronabinol]     Delusions.    Current Outpatient Prescriptions on File Prior to Visit  Medication Sig Dispense Refill  . EPIPEN 2-PAK 0.3 MG/0.3ML DEVI Inject 0.3 mg into the muscle as needed.       . fluticasone (FLONASE) 50 MCG/ACT nasal spray Place 1 spray into the nose daily.  16 g  5  . ibuprofen (ADVIL,MOTRIN) 200 MG tablet Take 800 mg by mouth every 6 (six) hours as needed for pain.      . Nutritional Supplements (JUICE PLUS FIBRE PO) Take 2 capsules by mouth 2 (two) times daily.      Marland Kitchen omeprazole (PRILOSEC OTC) 20 MG tablet Take 20 mg by mouth daily.        . saw palmetto 160 MG capsule Take 160 mg by mouth daily.        . sertraline (ZOLOFT) 100 MG tablet TAKE 1/2 TABLET  BY MOUTH EVERY DAY  90 tablet  2  . sildenafil (VIAGRA) 100 MG tablet Take 0.5 tablets (50 mg total) by mouth as needed for erectile dysfunction.  10 tablet  1  . amLODipine (NORVASC) 10 MG tablet Take 1 tablet (10 mg total) by mouth daily.  90 tablet  2  . cyanocobalamin 500 MCG tablet Take 500 mcg by mouth daily.       No current facility-administered medications on file prior to visit.    BP 146/90  Pulse 83  Temp(Src) 98.5 F (36.9 C) (Oral)  Resp 20  Wt 178 lb (80.74 kg)  SpO2 98%       Review of Systems  Constitutional: Negative for fever, chills, appetite change and fatigue.  HENT: Negative for congestion, dental problem, ear pain, hearing loss, sore throat, tinnitus, trouble swallowing and voice change.   Eyes: Negative for pain, discharge and visual disturbance.  Respiratory: Negative for cough, chest tightness, wheezing and stridor.   Cardiovascular: Negative for chest pain, palpitations and leg swelling.  Gastrointestinal: Negative for nausea, vomiting, abdominal pain,  diarrhea, constipation, blood in stool and abdominal distention.  Genitourinary: Negative for urgency, hematuria, flank pain, discharge, difficulty urinating and genital sores.  Musculoskeletal: Negative for arthralgias, back pain, gait problem, joint swelling, myalgias and neck stiffness.  Skin: Negative for rash.  Neurological: Negative for dizziness, syncope, speech difficulty, weakness, numbness and headaches.  Hematological: Negative for adenopathy. Does not bruise/bleed easily.  Psychiatric/Behavioral: Negative for behavioral problems and dysphoric mood. The patient is not nervous/anxious.        Objective:   Physical Exam  Constitutional: He is oriented to person, place, and time. He appears well-developed.  Blood pressure 140/90  HENT:  Head: Normocephalic.  Right Ear: External ear normal.  Left Ear: External ear normal.  Eyes: Conjunctivae and EOM are normal.  Neck: Normal range of motion.  Cardiovascular: Normal rate and normal heart sounds.   Pulmonary/Chest: Breath sounds normal.  Abdominal: Bowel sounds are normal.  Musculoskeletal: Normal range of motion. He exhibits no edema and no tenderness.  Neurological: He is alert and oriented to person, place, and time.  Psychiatric: He has a normal mood and affect. His behavior is normal.          Assessment & Plan:   Vasovagal syncope Hypertension. Patient will resume amlodipine CPX with PCP as scheduled He'll report any further symptoms

## 2013-06-21 NOTE — Progress Notes (Signed)
Pre-visit discussion using our clinic review tool. No additional management support is needed unless otherwise documented below in the visit note.  

## 2013-06-21 NOTE — Patient Instructions (Signed)
Call or return to clinic prn if these symptoms worsen or fail to improve as anticipated.

## 2013-06-28 ENCOUNTER — Ambulatory Visit: Payer: BC Managed Care – PPO | Admitting: Internal Medicine

## 2013-06-28 DIAGNOSIS — R55 Syncope and collapse: Secondary | ICD-10-CM | POA: Insufficient documentation

## 2013-08-09 ENCOUNTER — Telehealth: Payer: Self-pay | Admitting: Internal Medicine

## 2013-08-09 MED ORDER — BENZONATATE 200 MG PO CAPS: 200.0000 mg | ORAL_CAPSULE | Freq: Two times a day (BID) | ORAL | Status: DC | PRN

## 2013-08-09 NOTE — Telephone Encounter (Signed)
Spoke to pt told him acute bronchitis symptoms for less than 10 days are generally not helped by antibiotics. Take over-the-counter expectorants and cough medications such as Mucinex DM. Call if there is no improvement in 5 to 7 days or if he developed worsening cough, fever, or new symptoms, such as shortness of breath or chest pain per Dr. Kirtland Bouchard. Also Rx for Tessalon capsules was sent to pharmacy to help with cough. Pt verbalized understanding.

## 2013-08-09 NOTE — Telephone Encounter (Addendum)
Pt has cough, congestion, no fever. Sore throat. Would like to know if we could call in zpak. Pt had hydrocodone cough medicine on hand and this has kept him up all night. Would like something else for cough if possible Pt states he has seen dr Kirtland Bouchard most of the time. [facility]

## 2013-08-09 NOTE — Telephone Encounter (Signed)
Acute bronchitis symptoms for less than 10 days are generally not helped by antibiotics.  Take over-the-counter expectorants and cough medications such as  Mucinex DM.  Call if there is no improvement in 5 to 7 days or if he developed worsening cough, fever, or new symptoms, such as shortness of breath or chest pain.  Please call in a prescription for generic Tessalon 200 mg #20 one twice daily

## 2013-08-09 NOTE — Telephone Encounter (Signed)
Please advise 

## 2013-08-24 ENCOUNTER — Ambulatory Visit (INDEPENDENT_AMBULATORY_CARE_PROVIDER_SITE_OTHER): Payer: BC Managed Care – PPO | Admitting: Internal Medicine

## 2013-08-24 ENCOUNTER — Encounter: Payer: Self-pay | Admitting: Internal Medicine

## 2013-08-24 VITALS — BP 130/80 | HR 75 | Temp 98.0°F | Resp 20 | Ht 70.0 in | Wt 180.0 lb

## 2013-08-24 DIAGNOSIS — B349 Viral infection, unspecified: Secondary | ICD-10-CM

## 2013-08-24 DIAGNOSIS — I10 Essential (primary) hypertension: Secondary | ICD-10-CM

## 2013-08-24 DIAGNOSIS — B9789 Other viral agents as the cause of diseases classified elsewhere: Secondary | ICD-10-CM

## 2013-08-24 NOTE — Progress Notes (Signed)
Subjective:    Patient ID: [name], male    DOB: November 29, 1955, 58 y.o.   MRN: [id number]  HPI  58 year old patient who has treated hypertension. The past 2 weeks he has had cough sore throat headache and general sense of unwellness. He has infiltrating with Tylenol as well as saline sinus irrigation. No fever or productive cough. He was evaluated recently for syncope thought vasovagal in the setting of a URI. Symptoms have not reoccurred. He requires a letter on his calf to the [facility] to maintain his driving privileges. Letter dictated He is scheduled for annual CPX with PCP next month  Past Medical History  Diagnosis Date  . Anemia   . Esophageal reflux   . Diverticulosis of colon (without mention of hemorrhage)   . Family hx colonic polyps   . Hiatal hernia   . Psychosexual dysfunction with inhibited sexual excitement   . Hypertension   . Hyperlipemia   . Acute myeloid leukemia in remission 1999  . History of kidney stones   . Depression   . Arthritis     History   Social History  . Marital Status: Married    Spouse Name: N/A    Number of Children: 2  . Years of Education: N/A   Occupational History  . self employed    Social History Main Topics  . Smoking status: Never Smoker   . Smokeless tobacco: Never Used  . Alcohol Use: Yes     Comment: one occ   . Drug Use: No  . Sexual Activity: No   Other Topics Concern  . Not on file   Social History Narrative   2 caffeine drinks daily     Past Surgical History  Procedure Laterality Date  . Shoulder surgery      x 3     Family History  Problem Relation Age of Onset  . Hypertension Father   . Heart disease Father   . Prostate cancer Father     dx late 8s  . Cancer Father 17    prostate ca  . Celiac disease Mother   . Colon polyps Mother   . Breast cancer Mother   . Cancer Mother     Bladder  . Colon cancer Neg Hx     Allergies  Allergen Reactions  . Marinol [Dronabinol]    Delusions.    Current Outpatient Prescriptions on File Prior to Visit  Medication Sig Dispense Refill  . amLODipine (NORVASC) 10 MG tablet Take 1 tablet (10 mg total) by mouth daily.  90 tablet  2  . EPIPEN 2-PAK 0.3 MG/0.3ML DEVI Inject 0.3 mg into the muscle as needed.       . fluticasone (FLONASE) 50 MCG/ACT nasal spray Place 1 spray into the nose daily.  16 g  5  . ibuprofen (ADVIL,MOTRIN) 200 MG tablet Take 800 mg by mouth every 6 (six) hours as needed for pain.      . Nutritional Supplements (JUICE PLUS FIBRE PO) Take 2 capsules by mouth 2 (two) times daily.      Marland Kitchen omeprazole (PRILOSEC OTC) 20 MG tablet Take 20 mg by mouth daily.        . saw palmetto 160 MG capsule Take 160 mg by mouth daily.        . sertraline (ZOLOFT) 100 MG tablet TAKE 1/2 TABLET BY MOUTH EVERY DAY  90 tablet  2  . sildenafil (VIAGRA) 100 MG tablet Take 0.5 tablets (50 mg  total) by mouth as needed for erectile dysfunction.  10 tablet  1  . valACYclovir (VALTREX) 1000 MG tablet Take 1,000 mg by mouth as needed. TAKE 1 TABLET BY MOUTH EVERY DAY      . benzonatate (TESSALON) 200 MG capsule Take 1 capsule (200 mg total) by mouth 2 (two) times daily as needed for cough.  20 capsule  0  . cyanocobalamin 500 MCG tablet Take 500 mcg by mouth daily.       No current facility-administered medications on file prior to visit.    BP 130/80  Pulse 75  Temp(Src) 98 F (36.7 C) (Oral)  Resp 20  Ht 5\' 10"  (1.778 m)  Wt 180 lb (81.647 kg)  BMI 25.83 kg/m2  SpO2 96%       Review of Systems  Constitutional: Positive for fatigue. Negative for fever, chills and appetite change.  HENT: Positive for rhinorrhea and sore throat. Negative for congestion, dental problem, ear pain, hearing loss, tinnitus, trouble swallowing and voice change.   Eyes: Negative for pain, discharge and visual disturbance.  Respiratory: Positive for cough. Negative for chest tightness, wheezing and stridor.   Cardiovascular: Negative for chest  pain, palpitations and leg swelling.  Gastrointestinal: Negative for nausea, vomiting, abdominal pain, diarrhea, constipation, blood in stool and abdominal distention.  Genitourinary: Negative for urgency, hematuria, flank pain, discharge, difficulty urinating and genital sores.  Musculoskeletal: Negative for arthralgias, back pain, gait problem, joint swelling, myalgias and neck stiffness.  Skin: Negative for rash.  Neurological: Positive for headaches. Negative for dizziness, syncope, speech difficulty, weakness and numbness.  Hematological: Negative for adenopathy. Does not bruise/bleed easily.  Psychiatric/Behavioral: Negative for behavioral problems and dysphoric mood. The patient is not nervous/anxious.        Objective:   Physical Exam  Constitutional: He is oriented to person, place, and time. He appears well-developed.  HENT:  Head: Normocephalic.  Right Ear: External ear normal.  Left Ear: External ear normal.  Very mild erythema of the oropharynx Low hanging soft palate  Eyes: Conjunctivae and EOM are normal.  Neck: Normal range of motion.  Cardiovascular: Normal rate and normal heart sounds.   Pulmonary/Chest: Breath sounds normal.  Abdominal: Bowel sounds are normal.  Musculoskeletal: Normal range of motion. He exhibits no edema and no tenderness.  Neurological: He is alert and oriented to person, place, and time.  Psychiatric: He has a normal mood and affect. His behavior is normal.          Assessment & Plan:   Viral URI. History of syncope vasovagal in the setting of viral illness Hypertension well controlled  Letter  Dictated to [facility]

## 2013-08-24 NOTE — Progress Notes (Signed)
Pre-visit discussion using our clinic review tool. No additional management support is needed unless otherwise documented below in the visit note.  

## 2013-08-24 NOTE — Patient Instructions (Signed)
.    Use saline irrigation, warm  moist compresses and over-the-counter decongestants only as directed.  Call if there is no improvement in 5 to 7 days, or sooner if you develop increasing pain, fever, or any new symptoms.  Annual preventive exam as scheduled   Take Aleve 200 mg twice daily for pain or swelling

## 2013-09-13 ENCOUNTER — Telehealth: Payer: Self-pay | Admitting: Internal Medicine

## 2013-09-13 ENCOUNTER — Other Ambulatory Visit: Payer: BC Managed Care – PPO

## 2013-09-13 MED ORDER — OSELTAMIVIR PHOSPHATE 75 MG PO CAPS: 75.0000 mg | ORAL_CAPSULE | Freq: Two times a day (BID) | ORAL | Status: DC

## 2013-09-13 NOTE — Telephone Encounter (Signed)
rx sent in electronically 

## 2013-09-13 NOTE — Telephone Encounter (Signed)
tamiflu 75 mg po bid for 5 days. 

## 2013-09-13 NOTE — Addendum Note (Signed)
Addended by: Townsend Roger D on: 09/13/2013 05:04 PM   Modules accepted: Orders

## 2013-09-13 NOTE — Telephone Encounter (Signed)
Patient Information:  Caller Name: [name]  Phone: [phone]  Patient: [name], [name]  Gender: Male  DOB: 12/17/1955  Age: 58 Years  PCP: Phoebe Sharps (Adults only)  Office Follow Up:  Does the office need to follow up with this patient?: Yes  Instructions For The Office: Requesting a RX for Tamiflu.  Please call pt if this can be  called in.   Symptoms  Reason For Call & Symptoms: [name] having :achiness" all over"  that started 09/12/13 PM.  afebrile, but does have a H/A and nausea.  He is taking Phenergan and Tylenol ES  Reviewed Health History In EMR: Yes  Reviewed Medications In EMR: Yes  Reviewed Allergies In EMR: No  Reviewed Surgeries / Procedures: No  Date of Onset of Symptoms: 09/12/2013  Treatments Tried: Phenergan and ES Tylenol  Treatments Tried Worked: No  Guideline(s) Used:  Influenza - Seasonal  Disposition Per Guideline:   Discuss with PCP and Callback by Nurse Today  Reason For Disposition Reached:   Patient requests antiviral medicine for influenza and flu symptoms present < 48 hours  Advice Given:  Isolation is Needed Until After the Fever is Gone:   The CDC recommends that people with influenza-like illness remain at home until at least 24 hours after they are free of fever (100 F or 37.8C).  Do NOT go to work or school.  Do NOT go to church, child care centers, shopping, or other public places.  Do NOT shake hands.  Avoid close contact with others (hugging, kissing).  Patient Refused Recommendation:  Patient Requests Prescription  Requesting RX for Tamiflu.

## 2013-09-14 ENCOUNTER — Telehealth: Payer: Self-pay | Admitting: Internal Medicine

## 2013-09-14 NOTE — Telephone Encounter (Signed)
Relevant patient education mailed to patient.  

## 2013-09-16 ENCOUNTER — Other Ambulatory Visit (INDEPENDENT_AMBULATORY_CARE_PROVIDER_SITE_OTHER): Payer: BC Managed Care – PPO

## 2013-09-16 DIAGNOSIS — Z0000 Encounter for general adult medical examination without abnormal findings: Secondary | ICD-10-CM

## 2013-09-16 LAB — POCT URINALYSIS DIPSTICK
Glucose, UA: NEGATIVE
Leukocytes, UA: NEGATIVE
Nitrite, UA: NEGATIVE
PH UA: 6
RBC UA: NEGATIVE
SPEC GRAV UA: 1.02
UROBILINOGEN UA: 0.2

## 2013-09-16 LAB — CBC WITH DIFFERENTIAL/PLATELET
BASOS ABS: 0 10*3/uL (ref 0.0–0.1)
BASOS PCT: 0.3 % (ref 0.0–3.0)
Eosinophils Absolute: 0.2 10*3/uL (ref 0.0–0.7)
Eosinophils Relative: 3.2 % (ref 0.0–5.0)
HEMATOCRIT: 42.3 % (ref 39.0–52.0)
HEMOGLOBIN: 14.1 g/dL (ref 13.0–17.0)
LYMPHS ABS: 1.4 10*3/uL (ref 0.7–4.0)
LYMPHS PCT: 27.1 % (ref 12.0–46.0)
MCHC: 33.3 g/dL (ref 30.0–36.0)
MCV: 96.4 fl (ref 78.0–100.0)
MONOS PCT: 13.5 % — AB (ref 3.0–12.0)
Monocytes Absolute: 0.7 10*3/uL (ref 0.1–1.0)
NEUTROS ABS: 2.8 10*3/uL (ref 1.4–7.7)
Neutrophils Relative %: 55.9 % (ref 43.0–77.0)
Platelets: 301 10*3/uL (ref 150.0–400.0)
RBC: 4.38 Mil/uL (ref 4.22–5.81)
RDW: 13.6 % (ref 11.5–14.6)
WBC: 5.1 10*3/uL (ref 4.5–10.5)

## 2013-09-16 LAB — TSH: TSH: 3.64 u[IU]/mL (ref 0.35–5.50)

## 2013-09-16 LAB — PSA: PSA: 0.87 ng/mL (ref 0.10–4.00)

## 2013-09-19 LAB — LIPID PANEL
CHOL/HDL RATIO: 3
Cholesterol: 147 mg/dL (ref 0–200)
HDL: 44.7 mg/dL (ref >39.00)
LDL CALC: 80 mg/dL (ref 0–99)
Triglycerides: 113 mg/dL (ref 0.0–149.0)
VLDL: 22.6 mg/dL (ref 0.0–40.0)

## 2013-09-19 LAB — BASIC METABOLIC PANEL
BUN: 21 mg/dL (ref 6–23)
CO2: 28 meq/L (ref 19–32)
Calcium: 9 mg/dL (ref 8.4–10.5)
Chloride: 105 mEq/L (ref 96–112)
Creatinine, Ser: 1 mg/dL (ref 0.4–1.5)
GFR: 79.69 mL/min (ref >60.00)
GLUCOSE: 103 mg/dL — AB (ref 70–99)
POTASSIUM: 4.4 meq/L (ref 3.5–5.1)
SODIUM: 141 meq/L (ref 135–145)

## 2013-09-19 LAB — HEPATIC FUNCTION PANEL
ALBUMIN: 4.2 g/dL (ref 3.5–5.2)
ALK PHOS: 42 U/L (ref 39–117)
ALT: 28 U/L (ref 0–53)
AST: 23 U/L (ref 0–37)
Bilirubin, Direct: 0 mg/dL (ref 0.0–0.3)
TOTAL PROTEIN: 6.5 g/dL (ref 6.0–8.3)
Total Bilirubin: 0.5 mg/dL (ref 0.3–1.2)

## 2013-09-22 ENCOUNTER — Ambulatory Visit (INDEPENDENT_AMBULATORY_CARE_PROVIDER_SITE_OTHER): Payer: BC Managed Care – PPO | Admitting: Internal Medicine

## 2013-09-22 ENCOUNTER — Encounter: Payer: Self-pay | Admitting: Internal Medicine

## 2013-09-22 VITALS — BP 122/82 | HR 81 | Temp 98.0°F | Ht 70.5 in | Wt 178.0 lb

## 2013-09-22 DIAGNOSIS — Z0000 Encounter for general adult medical examination without abnormal findings: Secondary | ICD-10-CM

## 2013-09-22 NOTE — Progress Notes (Signed)
cpx  Past Medical History  Diagnosis Date  . Anemia   . Esophageal reflux   . Diverticulosis of colon (without mention of hemorrhage)   . Family hx colonic polyps   . Hiatal hernia   . Psychosexual dysfunction with inhibited sexual excitement   . Hypertension   . Hyperlipemia   . Acute myeloid leukemia in remission 1999  . History of kidney stones   . Depression   . Arthritis     History   Social History  . Marital Status: Married    Spouse Name: N/A    Number of Children: 2  . Years of Education: N/A   Occupational History  . self employed    Social History Main Topics  . Smoking status: Never Smoker   . Smokeless tobacco: Never Used  . Alcohol Use: Yes     Comment: one occ   . Drug Use: No  . Sexual Activity: No   Other Topics Concern  . Not on file   Social History Narrative   2 caffeine drinks daily     Past Surgical History  Procedure Laterality Date  . Shoulder surgery      x 3     Family History  Problem Relation Age of Onset  . Hypertension Father   . Heart disease Father   . Prostate cancer Father     dx late 46s  . Cancer Father 25    prostate ca  . Celiac disease Mother   . Colon polyps Mother   . Breast cancer Mother   . Cancer Mother     Bladder  . Colon cancer Neg Hx     Allergies  Allergen Reactions  . Marinol [Dronabinol]     Delusions.    Current Outpatient Prescriptions on File Prior to Visit  Medication Sig Dispense Refill  . amLODipine (NORVASC) 10 MG tablet Take 1 tablet (10 mg total) by mouth daily.  90 tablet  2  . Nutritional Supplements (JUICE PLUS FIBRE PO) Take 2 capsules by mouth 2 (two) times daily.      Marland Kitchen omeprazole (PRILOSEC OTC) 20 MG tablet Take 20 mg by mouth daily.        . sertraline (ZOLOFT) 100 MG tablet TAKE 1/2 TABLET BY MOUTH EVERY DAY  90 tablet  2  . sildenafil (VIAGRA) 100 MG tablet Take 0.5 tablets (50 mg total) by mouth as needed for erectile dysfunction.  10 tablet  1  . EPIPEN 2-PAK 0.3  MG/0.3ML DEVI Inject 0.3 mg into the muscle as needed.       . fluticasone (FLONASE) 50 MCG/ACT nasal spray Place 1 spray into the nose daily.  16 g  5  . ibuprofen (ADVIL,MOTRIN) 200 MG tablet Take 800 mg by mouth every 6 (six) hours as needed for pain.      . valACYclovir (VALTREX) 1000 MG tablet Take 1,000 mg by mouth as needed. TAKE 1 TABLET BY MOUTH EVERY DAY       No current facility-administered medications on file prior to visit.     patient denies chest pain, shortness of breath, orthopnea. Denies lower extremity edema, abdominal pain, change in appetite, change in bowel movements. Patient denies rashes, musculoskeletal complaints. No other specific complaints in a complete review of systems.   BP 122/82  Pulse 81  Temp(Src) 98 F (36.7 C) (Oral)  Ht 5' 10.5" (1.791 m)  Wt 178 lb (80.74 kg)  BMI 25.17 kg/m2 Well-developed male in no acute distress. HEENT  exam atraumatic, normocephalic, extraocular muscles are intact. Conjunctivae are pink without exudate. Neck is supple without lymphadenopathy, thyromegaly, jugular venous distention. Chest is clear to auscultation without increased work of breathing. Cardiac exam S1-S2 are regular. The PMI is normal. No significant murmurs or gallops. Abdominal exam active bowel sounds, soft, nontender. No abdominal bruits. Extremities no clubbing cyanosis or edema. Peripheral pulses are normal without bruits. Neurologic exam alert and oriented without any motor or sensory deficits. Rectal exam normal tone prostate normal size without masses or asymmetry.  Well visit- health maint UTD

## 2013-09-22 NOTE — Progress Notes (Signed)
Pre visit review using our clinic review tool, if applicable. No additional management support is needed unless otherwise documented below in the visit note. 

## 2013-09-23 ENCOUNTER — Encounter: Payer: BC Managed Care – PPO | Admitting: Internal Medicine

## 2014-01-05 ENCOUNTER — Other Ambulatory Visit: Payer: Self-pay | Admitting: Dermatology

## 2014-01-16 ENCOUNTER — Other Ambulatory Visit: Payer: Self-pay | Admitting: Internal Medicine

## 2014-03-08 ENCOUNTER — Other Ambulatory Visit: Payer: Self-pay | Admitting: Internal Medicine

## 2014-04-10 ENCOUNTER — Other Ambulatory Visit: Payer: Self-pay | Admitting: Internal Medicine

## 2014-04-18 ENCOUNTER — Other Ambulatory Visit: Payer: Self-pay | Admitting: Internal Medicine

## 2014-04-19 ENCOUNTER — Telehealth: Payer: Self-pay | Admitting: Internal Medicine

## 2014-04-19 NOTE — Telephone Encounter (Signed)
ok 

## 2014-04-19 NOTE — Telephone Encounter (Signed)
Pt was previous swords pt, has seen you several times. Would like to know if you will accept him?

## 2014-04-25 ENCOUNTER — Ambulatory Visit (INDEPENDENT_AMBULATORY_CARE_PROVIDER_SITE_OTHER): Payer: BC Managed Care – PPO | Admitting: Internal Medicine

## 2014-04-25 ENCOUNTER — Encounter: Payer: Self-pay | Admitting: Internal Medicine

## 2014-04-25 VITALS — BP 138/80 | HR 73 | Temp 97.5°F | Resp 20 | Ht 70.5 in | Wt 179.0 lb

## 2014-04-25 DIAGNOSIS — Z23 Encounter for immunization: Secondary | ICD-10-CM

## 2014-04-25 DIAGNOSIS — R7301 Impaired fasting glucose: Secondary | ICD-10-CM

## 2014-04-25 DIAGNOSIS — I10 Essential (primary) hypertension: Secondary | ICD-10-CM

## 2014-04-25 MED ORDER — AMLODIPINE BESYLATE 10 MG PO TABS: ORAL_TABLET | ORAL | Status: DC

## 2014-04-25 NOTE — Patient Instructions (Signed)
Limit your sodium (Salt) intake  Please check your blood pressure on a regular basis.  If it is consistently greater than 150/90, please make an office appointment.  Return in 6 months for follow-up   

## 2014-04-25 NOTE — Progress Notes (Signed)
Pre visit review using our clinic review tool, if applicable. No additional management support is needed unless otherwise documented below in the visit note. 

## 2014-04-25 NOTE — Progress Notes (Signed)
Subjective:    Patient ID: [name], male    DOB: April 19, 1956, 58 y.o.   MRN: [id number]  HPI  58 year old patient who has treated hypertension.  He is seen here for evaluation and update of his medications.  He has remote history of AML and is followed at [facility] annually.  No concerns or complaints  Past Medical History  Diagnosis Date  . Anemia   . Esophageal reflux   . Diverticulosis of colon (without mention of hemorrhage)   . Family hx colonic polyps   . Hiatal hernia   . Psychosexual dysfunction with inhibited sexual excitement   . Hypertension   . Hyperlipemia   . Acute myeloid leukemia in remission 1999  . History of kidney stones   . Depression   . Arthritis     History   Social History  . Marital Status: Married    Spouse Name: N/A    Number of Children: 2  . Years of Education: N/A   Occupational History  . self employed    Social History Main Topics  . Smoking status: Never Smoker   . Smokeless tobacco: Never Used  . Alcohol Use: Yes     Comment: one occ   . Drug Use: No  . Sexual Activity: No   Other Topics Concern  . Not on file   Social History Narrative   2 caffeine drinks daily     Past Surgical History  Procedure Laterality Date  . Shoulder surgery      x 3     Family History  Problem Relation Age of Onset  . Hypertension Father   . Heart disease Father   . Prostate cancer Father     dx late 10s  . Cancer Father 1    prostate ca  . Celiac disease Mother   . Colon polyps Mother   . Breast cancer Mother   . Cancer Mother     Bladder  . Colon cancer Neg Hx     Allergies  Allergen Reactions  . Marinol [Dronabinol]     Delusions.    Current Outpatient Prescriptions on File Prior to Visit  Medication Sig Dispense Refill  . EPIPEN 2-PAK 0.3 MG/0.3ML DEVI Inject 0.3 mg into the muscle as needed.       Marland Kitchen omeprazole (PRILOSEC OTC) 20 MG tablet Take 20 mg by mouth daily.        . sildenafil (VIAGRA) 100 MG tablet  Take 0.5 tablets (50 mg total) by mouth as needed for erectile dysfunction.  10 tablet  1  . valACYclovir (VALTREX) 1000 MG tablet TAKE 1 TABLET BY MOUTH EVERY DAY  30 tablet  0   No current facility-administered medications on file prior to visit.    BP 138/80  Pulse 73  Temp(Src) 97.5 F (36.4 C) (Oral)  Resp 20  Ht 5' 10.5" (1.791 m)  Wt 179 lb (81.194 kg)  BMI 25.31 kg/m2  SpO2 98%     Review of Systems  Constitutional: Negative for fever, chills, appetite change and fatigue.  HENT: Negative for congestion, dental problem, ear pain, hearing loss, sore throat, tinnitus, trouble swallowing and voice change.   Eyes: Negative for pain, discharge and visual disturbance.  Respiratory: Negative for cough, chest tightness, wheezing and stridor.   Cardiovascular: Negative for chest pain, palpitations and leg swelling.  Gastrointestinal: Negative for nausea, vomiting, abdominal pain, diarrhea, constipation, blood in stool and abdominal distention.  Genitourinary: Negative for urgency, hematuria, flank  pain, discharge, difficulty urinating and genital sores.  Musculoskeletal: Negative for arthralgias, back pain, gait problem, joint swelling, myalgias and neck stiffness.  Skin: Negative for rash.  Neurological: Negative for dizziness, syncope, speech difficulty, weakness, numbness and headaches.  Hematological: Negative for adenopathy. Does not bruise/bleed easily.  Psychiatric/Behavioral: Negative for behavioral problems and dysphoric mood. The patient is not nervous/anxious.        Objective:   Physical Exam  Constitutional: He is oriented to person, place, and time. He appears well-developed.  1:30 over 80  HENT:  Head: Normocephalic.  Right Ear: External ear normal.  Left Ear: External ear normal.  Eyes: Conjunctivae and EOM are normal.  Neck: Normal range of motion.  Cardiovascular: Normal rate and normal heart sounds.   Pulmonary/Chest: Breath sounds normal.  Abdominal:  Bowel sounds are normal.  Musculoskeletal: Normal range of motion. He exhibits no edema and no tenderness.  Neurological: He is alert and oriented to person, place, and time.  Psychiatric: He has a normal mood and affect. His behavior is normal.          Assessment & Plan:   Hypertension well controlled   Impaired glucose tolerance.  Lifestyle issues addressed    Remote history of AML

## 2014-05-26 ENCOUNTER — Ambulatory Visit (INDEPENDENT_AMBULATORY_CARE_PROVIDER_SITE_OTHER): Payer: BC Managed Care – PPO | Admitting: Physician Assistant

## 2014-05-26 ENCOUNTER — Encounter: Payer: Self-pay | Admitting: Physician Assistant

## 2014-05-26 VITALS — BP 110/70 | HR 72 | Resp 18 | Wt 179.7 lb

## 2014-05-26 DIAGNOSIS — M62838 Other muscle spasm: Secondary | ICD-10-CM

## 2014-05-26 MED ORDER — MELOXICAM 7.5 MG PO TABS: 7.5000 mg | ORAL_TABLET | Freq: Every day | ORAL | Status: DC

## 2014-05-26 MED ORDER — CYCLOBENZAPRINE HCL 5 MG PO TABS: 5.0000 mg | ORAL_TABLET | Freq: Three times a day (TID) | ORAL | Status: DC | PRN

## 2014-05-26 NOTE — Progress Notes (Signed)
Subjective:    Patient ID: KYAL ARTS, male    DOB: [birth year], 58 y.o.   MRN: [id number]  Leg Pain  The incident occurred 12 to 24 hours ago. The incident occurred at home. There was no injury mechanism. The pain is present in the right thigh (posterior right thigh). Quality: sharp pain. The pain is at a severity of 8/10. The pain is severe. The pain has been intermittent since onset. Pertinent negatives include no inability to bear weight, loss of motion, loss of sensation, muscle weakness, numbness or tingling. He reports no foreign bodies present. Nothing aggravates the symptoms. He has tried acetaminophen (and gentle massage.) for the symptoms. The treatment provided mild relief.      Review of Systems  Constitutional: Negative for fever and chills.  Respiratory: Negative for shortness of breath.   Cardiovascular: Negative for chest pain.  Gastrointestinal: Negative for nausea, vomiting and diarrhea.  Musculoskeletal: Negative for arthralgias, back pain and gait problem.  Neurological: Negative for tingling, syncope, numbness and headaches.  All other systems reviewed and are negative.  Past Medical History  Diagnosis Date  . Anemia   . Esophageal reflux   . Diverticulosis of colon (without mention of hemorrhage)   . Family hx colonic polyps   . Hiatal hernia   . Psychosexual dysfunction with inhibited sexual excitement   . Hypertension   . Hyperlipemia   . Acute myeloid leukemia in remission 1999  . History of kidney stones   . Depression   . Arthritis     History   Social History  . Marital Status: Married    Spouse Name: N/A    Number of Children: 2  . Years of Education: N/A   Occupational History  . self employed    Social History Main Topics  . Smoking status: Never Smoker   . Smokeless tobacco: Never Used  . Alcohol Use: Yes     Comment: one occ   . Drug Use: No  . Sexual Activity: No   Other Topics Concern  . Not on file   Social History  Narrative   2 caffeine drinks daily     Past Surgical History  Procedure Laterality Date  . Shoulder surgery      x 3     Family History  Problem Relation Age of Onset  . Hypertension Father   . Heart disease Father   . Prostate cancer Father     dx late 78s  . Cancer Father 67    prostate ca  . Celiac disease Mother   . Colon polyps Mother   . Breast cancer Mother   . Cancer Mother     Bladder  . Colon cancer Neg Hx     Allergies  Allergen Reactions  . Marinol [Dronabinol]     Delusions.    Current Outpatient Prescriptions on File Prior to Visit  Medication Sig Dispense Refill  . amLODipine (NORVASC) 10 MG tablet TAKE 1 TABLET EVERY DAY  90 tablet  1  . EPIPEN 2-PAK 0.3 MG/0.3ML DEVI Inject 0.3 mg into the muscle as needed.       . fluticasone (FLONASE) 50 MCG/ACT nasal spray Place 1 spray into the nose.      Marland Kitchen omeprazole (PRILOSEC OTC) 20 MG tablet Take 20 mg by mouth daily.        . sertraline (ZOLOFT) 25 MG tablet Take 12.5 mg by mouth daily.      . sildenafil (VIAGRA) 100 MG tablet  Take 0.5 tablets (50 mg total) by mouth as needed for erectile dysfunction.  10 tablet  1  . valACYclovir (VALTREX) 1000 MG tablet TAKE 1 TABLET BY MOUTH EVERY DAY  30 tablet  0   No current facility-administered medications on file prior to visit.    EXAM: BP 110/70  Pulse 72  Resp 18  Wt 179 lb 11.2 oz (81.511 kg)     Objective:   Physical Exam  Nursing note and vitals reviewed. Constitutional: He is oriented to person, place, and time. He appears well-developed and well-nourished. No distress.  HENT:  Head: Normocephalic and atraumatic.  Eyes: Conjunctivae and EOM are normal.  Neck: Normal range of motion.  Cardiovascular: Normal rate, regular rhythm and intact distal pulses.   Pulmonary/Chest: Effort normal and breath sounds normal. No respiratory distress. He exhibits no tenderness.  Musculoskeletal: Normal range of motion. He exhibits no edema and no tenderness.    Gait normal. Currently no ttp.  Neurological: He is alert and oriented to person, place, and time.  Strength sensation and reflexes all grossly intact bilaterally.  Skin: Skin is warm and dry. He is not diaphoretic. No pallor.  Psychiatric: He has a normal mood and affect. His behavior is normal. Judgment and thought content normal.     Lab Results  Component Value Date   WBC 5.1 09/16/2013   HGB 14.1 09/16/2013   HCT 42.3 09/16/2013   PLT 301.0 09/16/2013   GLUCOSE 103* 09/16/2013   CHOL 147 09/16/2013   TRIG 113.0 09/16/2013   HDL 44.70 09/16/2013   LDLDIRECT 146.2 07/29/2012   LDLCALC 80 09/16/2013   ALT 28 09/16/2013   AST 23 09/16/2013   NA 141 09/16/2013   K 4.4 09/16/2013   CL 105 09/16/2013   CREATININE 1.0 09/16/2013   BUN 21 09/16/2013   CO2 28 09/16/2013   TSH 3.64 09/16/2013   PSA 0.87 09/16/2013   INR 0.83 03/17/2012   HGBA1C 5.9 02/21/2010        Assessment & Plan:  Ozzy was seen today for right leg pain.  Diagnoses and associated orders for this visit:  Muscle spasm of right lower extremity Comments: Trial of Flexeril at night, and Mobic. RICE therapy. Watchful waiting. - cyclobenzaprine (FLEXERIL) 5 MG tablet; Take 1 tablet (5 mg total) by mouth 3 (three) times daily as needed for muscle spasms (No driving while taking this medication.). - meloxicam (MOBIC) 7.5 MG tablet; Take 1 tablet (7.5 mg total) by mouth daily.    Return precautions provided, and patient handout on RICE therapy.  Plan to follow up as needed, or for worsening or persistent symptoms despite treatment.  Patient Instructions  Trial of Flexeril at bedtime to relieve muscle spasms. Do not drive while taking this medication as it will inhibit your ability to operate a motor vehicle.  Trial of MOBIC one pill daily to decrease inflammation.  RICE (rest, ice, compression, elevation) therapy as directed.  Also try gentle massage and stretching for acute spasms.  If emergency symptoms discussed during visit  developed, seek medical attention immediately.  Followup as needed, or for worsening or persistent symptoms despite treatment.

## 2014-05-26 NOTE — Progress Notes (Signed)
Pre visit review using our clinic review tool, if applicable. No additional management support is needed unless otherwise documented below in the visit note. 

## 2014-05-26 NOTE — Patient Instructions (Addendum)
Trial of Flexeril at bedtime to relieve muscle spasms. Do not drive while taking this medication as it will inhibit your ability to operate a motor vehicle.  Trial of MOBIC one pill daily to decrease inflammation.  RICE (rest, ice, compression, elevation) therapy as directed.  Also try gentle massage and stretching for acute spasms.  If emergency symptoms discussed during visit developed, seek medical attention immediately.  Followup as needed, or for worsening or persistent symptoms despite treatment.    RICE: Routine Care for Injuries Rest, Ice, Compression, and Elevation (RICE) are often used to care for injuries. HOME CARE  Rest your injury.  Put ice on the injury.  Put ice in a plastic bag.  Place a towel between your skin and the bag.  Leave the ice on for 15-20 minutes, 03-04 times a day. Do this for as long as told by your doctor.  Apply pressure (compression) with an elastic bandage. Remove and reapply the bandage every 3 to 4 hours. Do not wrap the bandage too tight. Wrap the bandage looser if the fingers or toes are puffy (swollen), blue, cold, painful, or lose feeling (numb).  Raise (elevate) your injury. Raise your injury above the heart if you can. GET HELP RIGHT AWAY IF:  You have lasting pain or puffiness.  Your injury is red, weak, or loses feeling.  Your problems get worse, not better, after several days. MAKE SURE YOU:  Understand these instructions.  Will watch your condition.  Will get help right away if you are not doing well or get worse. Document Released: 01/14/2008 Document Revised: 10/20/2011 Document Reviewed: 12/27/2010 [facility] Patient Information 2015 [place], [place]. This information is not intended to replace advice given to you by your health care provider. Make sure you discuss any questions you have with your health care provider.

## 2014-07-17 ENCOUNTER — Telehealth: Payer: Self-pay | Admitting: Internal Medicine

## 2014-07-17 NOTE — Telephone Encounter (Signed)
Spoke to the pt.  Per Dr. Sarajane Jews, pt will need to be seen in the office to receive a prescription for cough syrup.  He does not wish to make an appt at this time.  Advised to try OTC medication and to follow CAN advise as listed below.

## 2014-07-17 NOTE — Telephone Encounter (Signed)
Printed for review

## 2014-07-17 NOTE — Telephone Encounter (Signed)
Patient Information:  Caller Name: [place]  Phone: [phone]  Patient: [name], [name]  Gender: Male  DOB: 1956/05/02  Age: 58 Years  PCP: Bluford Kaufmann (Family Practice > 65yrs old)  Office Follow Up:  Does the office need to follow up with this patient?: Yes  Instructions For The Office: Calling about cough, congestion and fever. Wants to know what OTC meds he can take for cold sxs. Gave him some advise regarding home remedies but also requests that Dr.K call in a cough med for him to take at night. PLEASE ADVISE.  RN Note:  Per caller request, message will be sent to the office. Home care advice given.  Symptoms  Reason For Call & Symptoms: Calling about cough, congestion and fever. Wants to know what OTC meds he can take for cold sxs; also requests that Dr.K call in a cough med for him to take at night.  Reviewed Health History In EMR: Yes  Reviewed Medications In EMR: Yes  Reviewed Allergies In EMR: Yes  Reviewed Surgeries / Procedures: Yes  Date of Onset of Symptoms: 07/14/2014  Treatments Tried: Delsym cough & cold  Treatments Tried Worked: No  Any Fever: Yes  Fever Taken: Oral  Fever Time Of Reading: 14:53:27  Fever Last Reading: 100.4  Guideline(s) Used:  Colds  Disposition Per Guideline:   Home Care  Reason For Disposition Reached:   Colds with no complications  Advice Given:  Reassurance  It sounds like an uncomplicated cold that we can treat at home.  Colds are caused by viruses, and no medicine or "shot" will cure an uncomplicated cold.  Colds are usually not serious.  Here is some care advice that should help.  For a Runny Nose With Profuse Discharge:   Nasal mucus and discharge helps to wash viruses and bacteria out of the nose and sinuses.  For a Stuffy Nose - Use Nasal Washes:  Introduction: Saline (salt water) nasal irrigation (nasal wash) is an effective and simple home remedy for treating stuffy nose and sinus congestion. The nose can be  irrigated by pouring, spraying, or squirting salt water into the nose and then letting it run back out.  Humidifier:  If the air in your home is dry, use a cool-mist humidifier  Contagiousness:  The cold virus is present in your nasal secretions.  Cover your nose and mouth with a tissue when you sneeze or cough.  Wash your hands frequently with soap and water.  You can return to work or school after the fever is gone and you feel well enough to participate in normal activities.  Treatment for Associated Symptoms of Colds:  For muscle aches, headaches, or moderate fever (more than 101 F or 38.9 C): Take acetaminophen every 4 hours.  Cough: Use cough drops.  Hydrate: Drink adequate liquids.  Call Back If:  Difficulty breathing occurs  You become worse  Caution - Nasal Decongestants:  Do not take these medications if you have high blood pressure, heart disease, prostate problems, or an overactive thyroid.  Patient Will Follow Care Advice:  YES

## 2014-08-06 ENCOUNTER — Other Ambulatory Visit: Payer: Self-pay | Admitting: Internal Medicine

## 2014-08-07 NOTE — Telephone Encounter (Signed)
Is this ok to refill?  

## 2014-08-07 NOTE — Telephone Encounter (Signed)
ok 

## 2014-08-31 ENCOUNTER — Ambulatory Visit (INDEPENDENT_AMBULATORY_CARE_PROVIDER_SITE_OTHER): Payer: BLUE CROSS/BLUE SHIELD | Admitting: Family Medicine

## 2014-08-31 ENCOUNTER — Encounter: Payer: Self-pay | Admitting: Family Medicine

## 2014-08-31 VITALS — BP 112/78 | HR 86 | Temp 97.6°F | Wt 179.0 lb

## 2014-08-31 DIAGNOSIS — J018 Other acute sinusitis: Secondary | ICD-10-CM

## 2014-08-31 MED ORDER — AZITHROMYCIN 250 MG PO TABS: ORAL_TABLET | ORAL | Status: DC

## 2014-08-31 NOTE — Progress Notes (Signed)
Pre visit review using our clinic review tool, if applicable. No additional management support is needed unless otherwise documented below in the visit note. 

## 2014-08-31 NOTE — Progress Notes (Signed)
   Subjective:    Patient ID: [name], male    DOB: 1955/10/13, 59 y.o.   MRN: [id number]  HPI  Acute visit for possible sinusitis. He had cold-like symptoms few weeks ago which eventually improved somewhat but has had over 2 weeks now of frontal and maxillary pressure bilaterally. He had some intermittent headaches and malaise. Bilateral ear pressure. He's had occasional dry cough. No fever. Similar symptoms with sinusitis in the past. He's tried saline nasal irrigation and over-the-counter medications without improvement. No known drug allergies  Past Medical History  Diagnosis Date  . Anemia   . Esophageal reflux   . Diverticulosis of colon (without mention of hemorrhage)   . Family hx colonic polyps   . Hiatal hernia   . Psychosexual dysfunction with inhibited sexual excitement   . Hypertension   . Hyperlipemia   . Acute myeloid leukemia in remission 1999  . History of kidney stones   . Depression   . Arthritis    Past Surgical History  Procedure Laterality Date  . Shoulder surgery      x 3     reports that he has never smoked. He has never used smokeless tobacco. He reports that he drinks alcohol. He reports that he does not use illicit drugs. family history includes Breast cancer in his mother; Cancer in his mother; Cancer (age of onset: 84) in his father; Celiac disease in his mother; Colon polyps in his mother; Heart disease in his father; Hypertension in his father; Prostate cancer in his father. There is no history of Colon cancer. Allergies  Allergen Reactions  . Marinol [Dronabinol]     Delusions.     Review of Systems  Constitutional: Negative for fatigue.  HENT: Positive for congestion. Negative for ear discharge.   Eyes: Negative for visual disturbance.  Respiratory: Positive for cough. Negative for chest tightness and shortness of breath.   Cardiovascular: Negative for chest pain, palpitations and leg swelling.  Neurological: Positive for headaches.  Negative for dizziness, syncope, weakness and light-headedness.       Objective:   Physical Exam  Constitutional: He appears well-developed and well-nourished.  HENT:  Right Ear: External ear normal.  Left Ear: External ear normal.  Mouth/Throat: Oropharynx is clear and moist.  Neck: Neck supple.  Cardiovascular: Normal rate and regular rhythm.   Pulmonary/Chest: Effort normal and breath sounds normal. No respiratory distress. He has no wheezes. He has no rales.          Assessment & Plan:  Probable acute sinusitis frontal and maxillary. Continue saline nasal irrigation and reminder to use distilled water and not tap water. Zithromax for 5 days. Follow-up as needed

## 2014-08-31 NOTE — Patient Instructions (Signed)

## 2014-09-21 ENCOUNTER — Other Ambulatory Visit (INDEPENDENT_AMBULATORY_CARE_PROVIDER_SITE_OTHER): Payer: BLUE CROSS/BLUE SHIELD

## 2014-09-21 DIAGNOSIS — Z0000 Encounter for general adult medical examination without abnormal findings: Secondary | ICD-10-CM

## 2014-09-21 LAB — HEPATIC FUNCTION PANEL
ALK PHOS: 52 U/L (ref 39–117)
ALT: 26 U/L (ref 0–53)
AST: 20 U/L (ref 0–37)
Albumin: 4.5 g/dL (ref 3.5–5.2)
BILIRUBIN DIRECT: 0.1 mg/dL (ref 0.0–0.3)
TOTAL PROTEIN: 6.6 g/dL (ref 6.0–8.3)
Total Bilirubin: 0.6 mg/dL (ref 0.2–1.2)

## 2014-09-21 LAB — BASIC METABOLIC PANEL
BUN: 21 mg/dL (ref 6–23)
CALCIUM: 9.3 mg/dL (ref 8.4–10.5)
CO2: 30 meq/L (ref 19–32)
Chloride: 104 mEq/L (ref 96–112)
Creatinine, Ser: 1.17 mg/dL (ref 0.40–1.50)
GFR: 67.78 mL/min (ref >60.00)
Glucose, Bld: 96 mg/dL (ref 70–99)
Potassium: 4 mEq/L (ref 3.5–5.1)
SODIUM: 141 meq/L (ref 135–145)

## 2014-09-21 LAB — CBC WITH DIFFERENTIAL/PLATELET
Basophils Absolute: 0 10*3/uL (ref 0.0–0.1)
Basophils Relative: 0.3 % (ref 0.0–3.0)
EOS PCT: 6.6 % — AB (ref 0.0–5.0)
Eosinophils Absolute: 0.3 10*3/uL (ref 0.0–0.7)
HEMATOCRIT: 40.5 % (ref 39.0–52.0)
HEMOGLOBIN: 14 g/dL (ref 13.0–17.0)
LYMPHS ABS: 1.2 10*3/uL (ref 0.7–4.0)
Lymphocytes Relative: 28.6 % (ref 12.0–46.0)
MCHC: 34.6 g/dL (ref 30.0–36.0)
MCV: 93.7 fl (ref 78.0–100.0)
MONO ABS: 0.5 10*3/uL (ref 0.1–1.0)
MONOS PCT: 11.8 % (ref 3.0–12.0)
NEUTROS ABS: 2.1 10*3/uL (ref 1.4–7.7)
Neutrophils Relative %: 52.7 % (ref 43.0–77.0)
Platelets: 319 10*3/uL (ref 150.0–400.0)
RBC: 4.32 Mil/uL (ref 4.22–5.81)
RDW: 13.5 % (ref 11.5–15.5)
WBC: 4.1 10*3/uL (ref 4.0–10.5)

## 2014-09-21 LAB — LIPID PANEL
CHOL/HDL RATIO: 4
Cholesterol: 205 mg/dL — ABNORMAL HIGH (ref 0–200)
HDL: 52.7 mg/dL (ref >39.00)
LDL CALC: 127 mg/dL — AB (ref 0–99)
NonHDL: 152.3
Triglycerides: 125 mg/dL (ref 0.0–149.0)
VLDL: 25 mg/dL (ref 0.0–40.0)

## 2014-09-21 LAB — POCT URINALYSIS DIP (MANUAL ENTRY)
BILIRUBIN UA: NEGATIVE
Bilirubin, UA: NEGATIVE
Blood, UA: NEGATIVE
Glucose, UA: NEGATIVE
LEUKOCYTES UA: NEGATIVE
Nitrite, UA: NEGATIVE
PH UA: 6.5
PROTEIN UA: NEGATIVE
Spec Grav, UA: 1.015
UROBILINOGEN UA: 0.2

## 2014-09-21 LAB — PSA: PSA: 0.81 ng/mL (ref 0.10–4.00)

## 2014-09-21 LAB — TSH: TSH: 3.06 u[IU]/mL (ref 0.35–4.50)

## 2014-09-28 ENCOUNTER — Ambulatory Visit (INDEPENDENT_AMBULATORY_CARE_PROVIDER_SITE_OTHER): Payer: BLUE CROSS/BLUE SHIELD | Admitting: Internal Medicine

## 2014-09-28 ENCOUNTER — Encounter: Payer: Self-pay | Admitting: Internal Medicine

## 2014-09-28 VITALS — BP 124/78 | HR 91 | Temp 97.9°F | Resp 16 | Ht 69.5 in | Wt 180.0 lb

## 2014-09-28 DIAGNOSIS — Z0000 Encounter for general adult medical examination without abnormal findings: Secondary | ICD-10-CM

## 2014-09-28 DIAGNOSIS — I10 Essential (primary) hypertension: Secondary | ICD-10-CM

## 2014-09-28 DIAGNOSIS — Z8371 Family history of colonic polyps: Secondary | ICD-10-CM

## 2014-09-28 DIAGNOSIS — R7301 Impaired fasting glucose: Secondary | ICD-10-CM

## 2014-09-28 DIAGNOSIS — C9201 Acute myeloblastic leukemia, in remission: Secondary | ICD-10-CM | POA: Insufficient documentation

## 2014-09-28 DIAGNOSIS — C9200 Acute myeloblastic leukemia, not having achieved remission: Secondary | ICD-10-CM

## 2014-09-28 DIAGNOSIS — E785 Hyperlipidemia, unspecified: Secondary | ICD-10-CM

## 2014-09-28 MED ORDER — SILDENAFIL CITRATE 100 MG PO TABS: 50.0000 mg | ORAL_TABLET | ORAL | Status: DC | PRN

## 2014-09-28 NOTE — Progress Notes (Signed)
Pre visit review using our clinic review tool, if applicable. No additional management support is needed unless otherwise documented below in the visit note. 

## 2014-09-28 NOTE — Progress Notes (Signed)
Subjective:    Patient ID: [name], male    DOB: 10/31/1955, 59 y.o.   MRN: [id number]  HPI 59 year old patient who is seen today for a preventive health examination. Medical problems included treated hypertension.  He has a history of reflux esophagitis and mild dyslipidemia.  He has had some mild impaired glucose tolerance in the past In 1999 he was diagnosed with AML and was treated with bone marrow transplantation in early 2000.  He has been disease-free and is followed annually still at [facility] a father died of complications of cardiac disease.  Mother died of complications of cerebrovascular disease, both in their mid to late eighties.  2 sisters are living and well  Past Medical History  Diagnosis Date  . Anemia   . Esophageal reflux   . Diverticulosis of colon (without mention of hemorrhage)   . Family hx colonic polyps   . Hiatal hernia   . Psychosexual dysfunction with inhibited sexual excitement   . Hypertension   . Hyperlipemia   . Acute myeloid leukemia in remission 1999  . History of kidney stones   . Depression   . Arthritis     History   Social History  . Marital Status: Married    Spouse Name: N/A  . Number of Children: 2  . Years of Education: N/A   Occupational History  . self employed    Social History Main Topics  . Smoking status: Never Smoker   . Smokeless tobacco: Never Used  . Alcohol Use: Yes     Comment: one occ   . Drug Use: No  . Sexual Activity: No   Other Topics Concern  . Not on file   Social History Narrative   2 caffeine drinks daily     Past Surgical History  Procedure Laterality Date  . Shoulder surgery      x 3     Family History  Problem Relation Age of Onset  . Hypertension Father   . Heart disease Father   . Prostate cancer Father     dx late 45s  . Cancer Father 30    prostate ca  . Celiac disease Mother   . Colon polyps Mother   . Breast cancer Mother   . Cancer Mother       Bladder  . Colon cancer Neg Hx     Allergies  Allergen Reactions  . Marinol [Dronabinol]     Delusions.    Current Outpatient Prescriptions on File Prior to Visit  Medication Sig Dispense Refill  . amLODipine (NORVASC) 10 MG tablet TAKE 1 TABLET EVERY DAY 90 tablet 1  . EPIPEN 2-PAK 0.3 MG/0.3ML DEVI Inject 0.3 mg into the muscle as needed.     Marland Kitchen omeprazole (PRILOSEC OTC) 20 MG tablet Take 20 mg by mouth daily.      . valACYclovir (VALTREX) 1000 MG tablet TAKE 1 TABLET BY MOUTH EVERY DAY (Patient taking differently: Take 1 tablet by mouth as needed.) 30 tablet 3  . fluticasone (FLONASE) 50 MCG/ACT nasal spray Place 1 spray into the nose.    . meloxicam (MOBIC) 7.5 MG tablet Take 1 tablet (7.5 mg total) by mouth daily. (Patient not taking: Reported on 09/28/2014) 30 tablet 0   No current facility-administered medications on file prior to visit.    BP 124/78 mmHg  Pulse 91  Temp(Src) 97.9 F (36.6 C) (Oral)  Resp 16  Ht 5' 9.5" (1.765 m)  Wt 180  lb (81.647 kg)  BMI 26.21 kg/m2  SpO2 98%      Review of Systems  Constitutional: Negative for fever, chills, activity change, appetite change and fatigue.  HENT: Positive for postnasal drip. Negative for congestion, dental problem, ear pain, hearing loss, mouth sores, rhinorrhea, sinus pressure, sneezing, tinnitus, trouble swallowing and voice change.   Eyes: Negative for photophobia, pain, redness and visual disturbance.  Respiratory: Negative for apnea, cough, choking, chest tightness, shortness of breath and wheezing.   Cardiovascular: Negative for chest pain, palpitations and leg swelling.  Gastrointestinal: Negative for nausea, vomiting, abdominal pain, diarrhea, constipation, blood in stool, abdominal distention, anal bleeding and rectal pain.  Genitourinary: Negative for dysuria, urgency, frequency, hematuria, flank pain, decreased urine volume, discharge, penile swelling, scrotal swelling, difficulty urinating, genital  sores and testicular pain.  Musculoskeletal: Negative for myalgias, back pain, joint swelling, arthralgias, gait problem, neck pain and neck stiffness.  Skin: Negative for color change, rash and wound.  Neurological: Negative for dizziness, tremors, seizures, syncope, facial asymmetry, speech difficulty, weakness, light-headedness, numbness and headaches.  Hematological: Negative for adenopathy. Does not bruise/bleed easily.  Psychiatric/Behavioral: Negative for suicidal ideas, hallucinations, behavioral problems, confusion, sleep disturbance, self-injury, dysphoric mood, decreased concentration and agitation. The patient is not nervous/anxious.        Objective:   Physical Exam  Constitutional: He appears well-developed and well-nourished.  HENT:  Head: Normocephalic and atraumatic.  Right Ear: External ear normal.  Left Ear: External ear normal.  Nose: Nose normal.  Mouth/Throat: Oropharynx is clear and moist.  Eyes: Conjunctivae and EOM are normal. Pupils are equal, round, and reactive to light. No scleral icterus.  Neck: Normal range of motion. Neck supple. No JVD present. No thyromegaly present.  Cardiovascular: Regular rhythm, normal heart sounds and intact distal pulses.  Exam reveals no gallop and no friction rub.   No murmur heard. Pulmonary/Chest: Effort normal and breath sounds normal. He exhibits no tenderness.  Abdominal: Soft. Bowel sounds are normal. He exhibits no distension and no mass. There is no tenderness.  Genitourinary: Prostate normal and penis normal.  Musculoskeletal: Normal range of motion. He exhibits no edema or tenderness.  Lymphadenopathy:    He has no cervical adenopathy.  Neurological: He is alert. He has normal reflexes. No cranial nerve deficit. Coordination normal.  Skin: Skin is warm and dry. No rash noted.  Psychiatric: He has a normal mood and affect. His behavior is normal.          Assessment & Plan:   Preventive health  examination History of AML.  Status post bone marrow transplantation 2000 Hypertension, stable  We'll continue present regimen, heart healthy diet and regular exercise encouraged Recheck one year or as needed

## 2014-09-28 NOTE — Patient Instructions (Addendum)
Limit your sodium (Salt) intake  Please check your blood pressure on a regular basis.  If it is consistently greater than 150/90, please make an office appointment.    It is important that you exercise regularly, at least 20 minutes 3 to 4 times per week.  If you develop chest pain or shortness of breath seek  medical attention.  Return in one year for follow-up   Health Maintenance A healthy lifestyle and preventative care can promote health and wellness.  Maintain regular health, dental, and eye exams.  Eat a healthy diet. Foods like vegetables, fruits, whole grains, low-fat dairy products, and lean protein foods contain the nutrients you need and are low in calories. Decrease your intake of foods high in solid fats, added sugars, and salt. Get information about a proper diet from your health care provider, if necessary.  Regular physical exercise is one of the most important things you can do for your health. Most adults should get at least 150 minutes of moderate-intensity exercise (any activity that increases your heart rate and causes you to sweat) each week. In addition, most adults need muscle-strengthening exercises on 2 or more days a week.   Maintain a healthy weight. The body mass index (BMI) is a screening tool to identify possible weight problems. It provides an estimate of body fat based on height and weight. Your health care provider can find your BMI and can help you achieve or maintain a healthy weight. For males 20 years and older:  A BMI below 18.5 is considered underweight.  A BMI of 18.5 to 24.9 is normal.  A BMI of 25 to 29.9 is considered overweight.  A BMI of 30 and above is considered obese.  Maintain normal blood lipids and cholesterol by exercising and minimizing your intake of saturated fat. Eat a balanced diet with plenty of fruits and vegetables. Blood tests for lipids and cholesterol should begin at age 32 and be repeated every 5 years. If your lipid or  cholesterol levels are high, you are over age 41, or you are at high risk for heart disease, you may need your cholesterol levels checked more frequently.Ongoing high lipid and cholesterol levels should be treated with medicines if diet and exercise are not working.  If you smoke, find out from your health care provider how to quit. If you do not use tobacco, do not start.  Lung cancer screening is recommended for adults aged 29-80 years who are at high risk for developing lung cancer because of a history of smoking. A yearly low-dose CT scan of the lungs is recommended for people who have at least a 30-pack-year history of smoking and are current smokers or have quit within the past 15 years. A pack year of smoking is smoking an average of 1 pack of cigarettes a day for 1 year (for example, a 30-pack-year history of smoking could mean smoking 1 pack a day for 30 years or 2 packs a day for 15 years). Yearly screening should continue until the smoker has stopped smoking for at least 15 years. Yearly screening should be stopped for people who develop a health problem that would prevent them from having lung cancer treatment.  If you choose to drink alcohol, do not have more than 2 drinks per day. One drink is considered to be 12 oz (360 mL) of beer, 5 oz (150 mL) of wine, or 1.5 oz (45 mL) of liquor.  Avoid the use of street drugs. Do not share  needles with anyone. Ask for help if you need support or instructions about stopping the use of drugs.  High blood pressure causes heart disease and increases the risk of stroke. Blood pressure should be checked at least every 1-2 years. Ongoing high blood pressure should be treated with medicines if weight loss and exercise are not effective.  If you are 79-62 years old, ask your health care provider if you should take aspirin to prevent heart disease.  Diabetes screening involves taking a blood sample to check your fasting blood sugar level. This should be done  once every 3 years after age 101 if you are at a normal weight and without risk factors for diabetes. Testing should be considered at a younger age or be carried out more frequently if you are overweight and have at least 1 risk factor for diabetes.  Colorectal cancer can be detected and often prevented. Most routine colorectal cancer screening begins at the age of 59 and continues through age 35. However, your health care provider may recommend screening at an earlier age if you have risk factors for colon cancer. On a yearly basis, your health care provider may provide home test kits to check for hidden blood in the stool. A small camera at the end of a tube may be used to directly examine the colon (sigmoidoscopy or colonoscopy) to detect the earliest forms of colorectal cancer. Talk to your health care provider about this at age 53 when routine screening begins. A direct exam of the colon should be repeated every 5-10 years through age 86, unless early forms of precancerous polyps or small growths are found.  People who are at an increased risk for hepatitis B should be screened for this virus. You are considered at high risk for hepatitis B if:  You were born in a country where hepatitis B occurs often. Talk with your health care provider about which countries are considered high risk.  Your parents were born in a high-risk country and you have not received a shot to protect against hepatitis B (hepatitis B vaccine).  You have HIV or AIDS.  You use needles to inject street drugs.  You live with, or have sex with, someone who has hepatitis B.  You are a man who has sex with other men (MSM).  You get hemodialysis treatment.  You take certain medicines for conditions like cancer, organ transplantation, and autoimmune conditions.  Hepatitis C blood testing is recommended for all people born from [birth year] through 1966 and any individual with known risk factors for hepatitis C.  Healthy men should  no longer receive prostate-specific antigen (PSA) blood tests as part of routine cancer screening. Talk to your health care provider about prostate cancer screening.  Testicular cancer screening is not recommended for adolescents or adult males who have no symptoms. Screening includes self-exam, a health care provider exam, and other screening tests. Consult with your health care provider about any symptoms you have or any concerns you have about testicular cancer.  Practice safe sex. Use condoms and avoid high-risk sexual practices to reduce the spread of sexually transmitted infections (STIs).  You should be screened for STIs, including gonorrhea and chlamydia if:  You are sexually active and are younger than 24 years.  You are older than 24 years, and your health care provider tells you that you are at risk for this type of infection.  Your sexual activity has changed since you were last screened, and you are at an increased  risk for chlamydia or gonorrhea. Ask your health care provider if you are at risk.  If you are at risk of being infected with HIV, it is recommended that you take a prescription medicine daily to prevent HIV infection. This is called pre-exposure prophylaxis (PrEP). You are considered at risk if:  You are a man who has sex with other men (MSM).  You are a heterosexual man who is sexually active with multiple partners.  You take drugs by injection.  You are sexually active with a partner who has HIV.  Talk with your health care provider about whether you are at high risk of being infected with HIV. If you choose to begin PrEP, you should first be tested for HIV. You should then be tested every 3 months for as long as you are taking PrEP.  Use sunscreen. Apply sunscreen liberally and repeatedly throughout the day. You should seek shade when your shadow is shorter than you. Protect yourself by wearing long sleeves, pants, a wide-brimmed hat, and sunglasses year round  whenever you are outdoors.  Tell your health care provider of new moles or changes in moles, especially if there is a change in shape or color. Also, tell your health care provider if a mole is larger than the size of a pencil eraser.  A one-time screening for abdominal aortic aneurysm (AAA) and surgical repair of large AAAs by ultrasound is recommended for men aged 52-75 years who are current or former smokers.  Stay current with your vaccines (immunizations). Document Released: 01/24/2008 Document Revised: 08/02/2013 Document Reviewed: 12/23/2010 [facility] Patient Information 2015 [place], [place]. This information is not intended to replace advice given to you by your health care provider. Make sure you discuss any questions you have with your health care provider.

## 2014-09-30 ENCOUNTER — Telehealth: Payer: Self-pay | Admitting: Internal Medicine

## 2014-09-30 NOTE — Telephone Encounter (Signed)
emmi emailed °

## 2014-10-07 ENCOUNTER — Other Ambulatory Visit: Payer: Self-pay | Admitting: Internal Medicine

## 2014-10-11 ENCOUNTER — Other Ambulatory Visit: Payer: Self-pay | Admitting: Internal Medicine

## 2015-02-24 IMAGING — CR DG CHEST 2V
2 series · 2 of 2 positions shown · non-contrast
Comparison: Chest CT March 17, 2012

CLINICAL DATA: Chest pain with loss of consciousness; nausea

CHEST - 2 VIEW

[w chest pa]
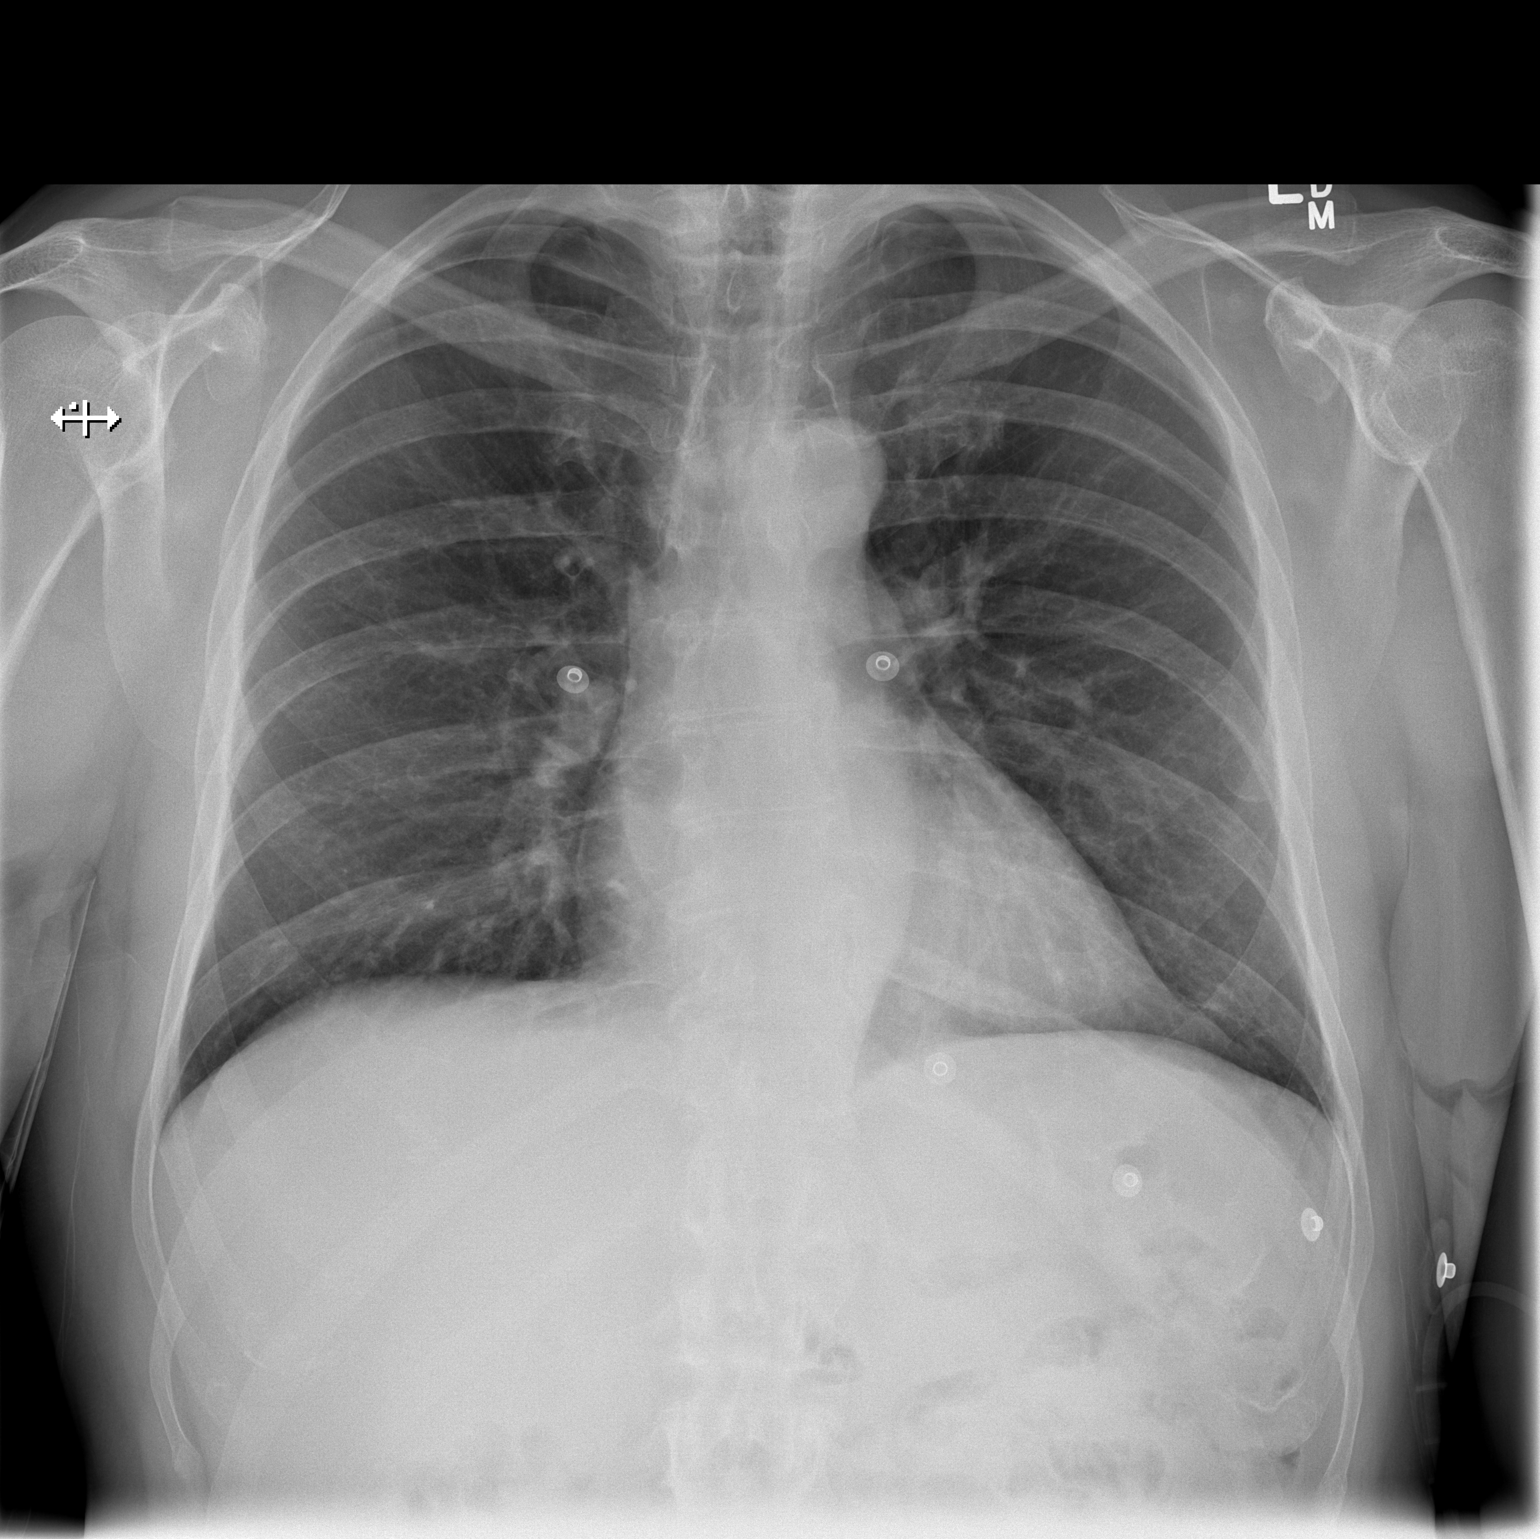

[w chest lat]
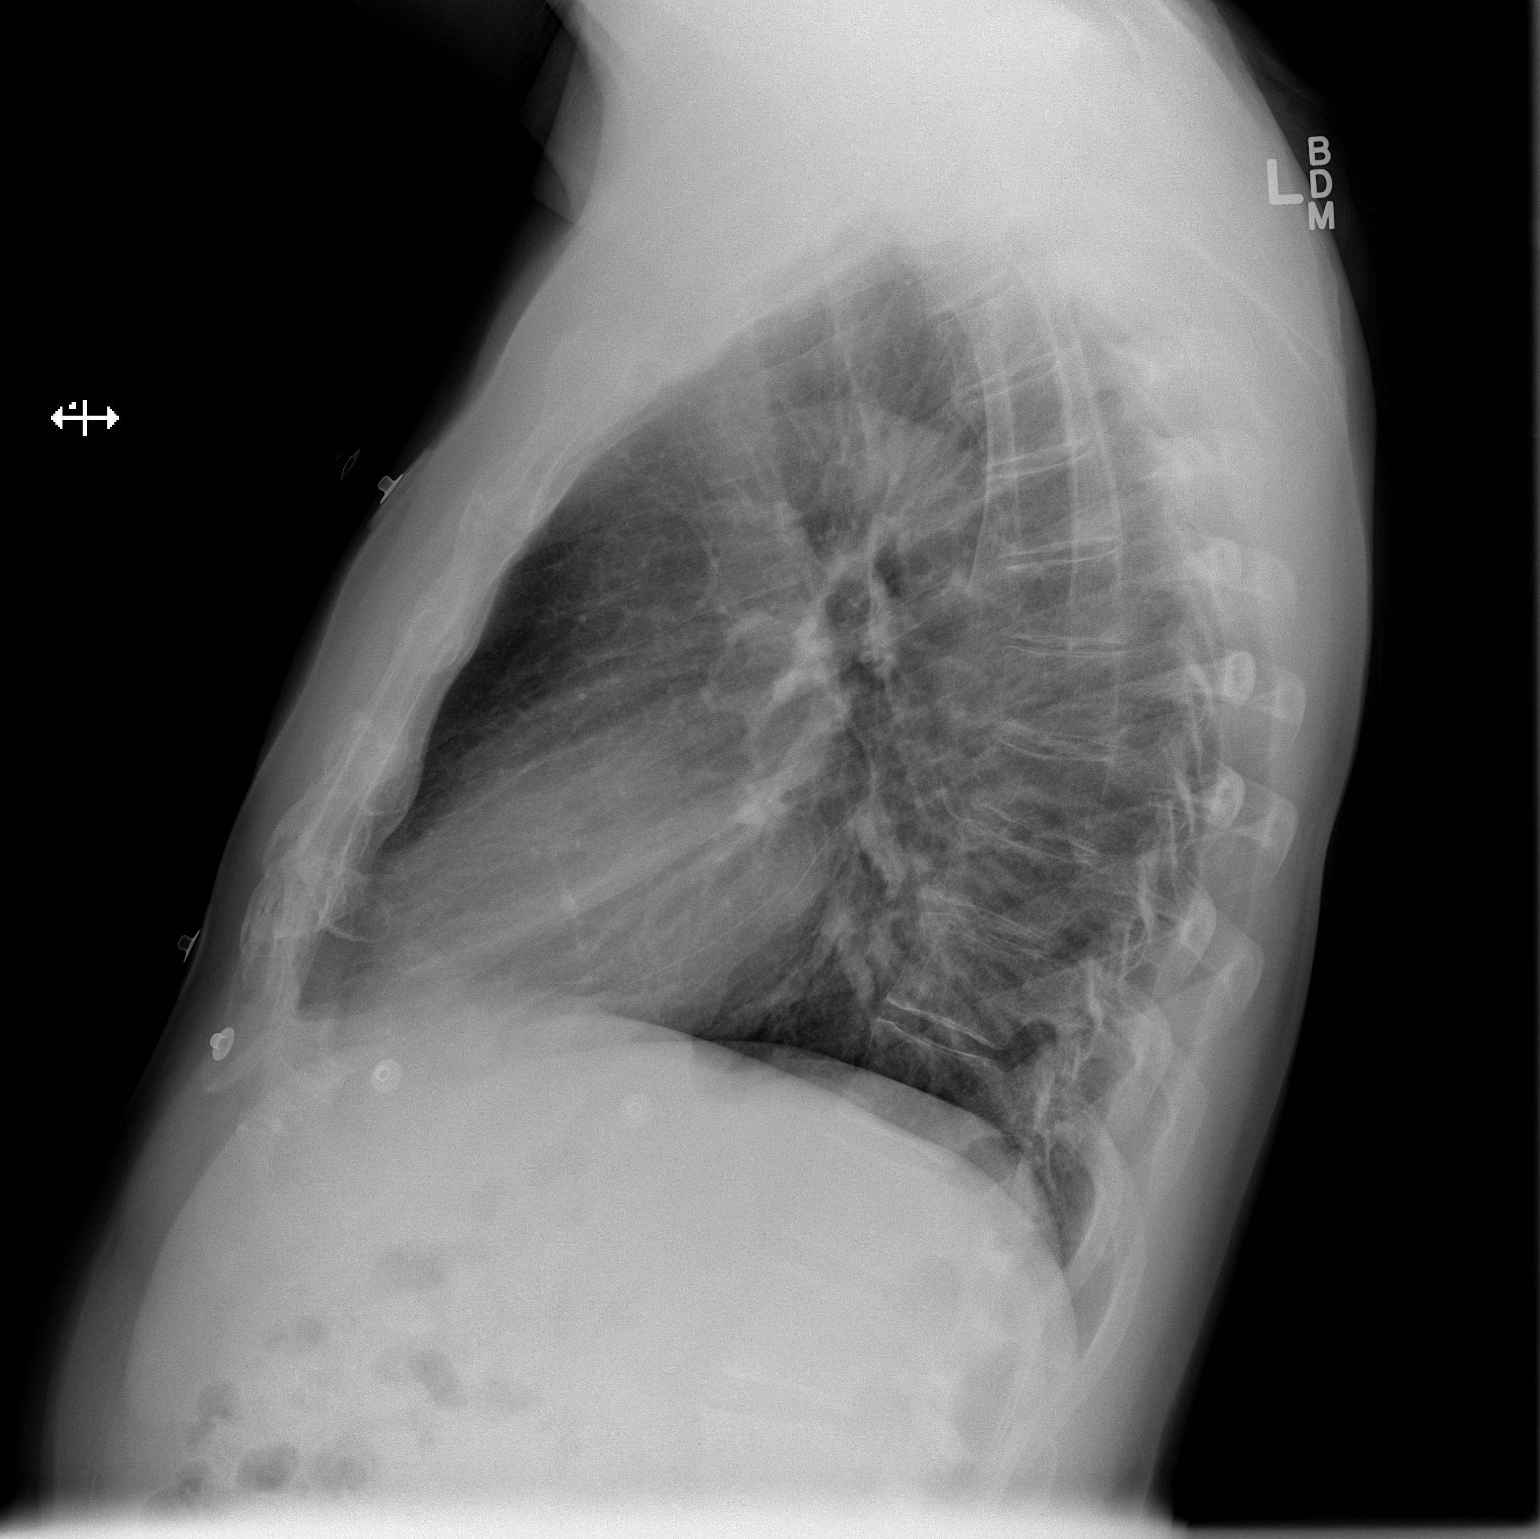

[2 of 2 positions shown; findings below may reference images not displayed]

FINDINGS: Lungs clear.  Heart size and pulmonary vascularity are
normal.  No adenopathy.  No bone lesions. No pneumothorax.
IMPRESSION: No edema or consolidation.

## 2015-04-30 ENCOUNTER — Encounter: Payer: Self-pay | Admitting: Gastroenterology

## 2015-05-30 ENCOUNTER — Telehealth: Payer: Self-pay | Admitting: Internal Medicine

## 2015-05-30 NOTE — Telephone Encounter (Signed)
Patient would like a recommendation on a counselor he can see re: depression and sleep issues. He is having some anxiety as well.

## 2015-06-01 NOTE — Telephone Encounter (Signed)
Dr Adelene Amas

## 2015-06-01 NOTE — Telephone Encounter (Signed)
[name], please call pt and give him Dr. Steele Berg number to schedule an appt per Dr. Raliegh Ip. Thanks.

## 2015-06-01 NOTE — Telephone Encounter (Signed)
Please see message and advise 

## 2015-06-01 NOTE — Telephone Encounter (Signed)
Pt wife was given  Dr Glennon Hamilton number [phone]

## 2015-07-20 ENCOUNTER — Ambulatory Visit (INDEPENDENT_AMBULATORY_CARE_PROVIDER_SITE_OTHER): Payer: BLUE CROSS/BLUE SHIELD | Admitting: Internal Medicine

## 2015-07-20 ENCOUNTER — Encounter: Payer: Self-pay | Admitting: Internal Medicine

## 2015-07-20 VITALS — BP 122/80 | HR 87 | Temp 98.5°F | Ht 69.5 in | Wt 178.0 lb

## 2015-07-20 DIAGNOSIS — E785 Hyperlipidemia, unspecified: Secondary | ICD-10-CM

## 2015-07-20 DIAGNOSIS — I10 Essential (primary) hypertension: Secondary | ICD-10-CM | POA: Diagnosis not present

## 2015-07-20 MED ORDER — SILDENAFIL CITRATE 100 MG PO TABS: 50.0000 mg | ORAL_TABLET | ORAL | Status: DC | PRN

## 2015-07-20 MED ORDER — ZOLPIDEM TARTRATE 10 MG PO TABS: 10.0000 mg | ORAL_TABLET | Freq: Every evening | ORAL | Status: DC | PRN

## 2015-07-20 NOTE — Progress Notes (Signed)
Subjective:    Patient ID: [name], male    DOB: 1956/02/07, 59 y.o.   MRN: [id number]  HPI  59 year old patient who has a history of essential hypertension.  He has had some situational stress at work and is having some insomnia and a difficult time with intrusive thoughts.  He has been on Zoloft in the past but this caused delayed ejaculation.  He is followed by psychology who suggested perhaps clonidine as an aid for blood pressure and to assist with sleep  Past Medical History  Diagnosis Date  . Anemia   . Esophageal reflux   . Diverticulosis of colon (without mention of hemorrhage)   . Family hx colonic polyps   . Hiatal hernia   . Psychosexual dysfunction with inhibited sexual excitement   . Hypertension   . Hyperlipemia   . Acute myeloid leukemia in remission ([place]) 1999  . History of kidney stones   . Depression   . Arthritis     Social History   Social History  . Marital Status: Married    Spouse Name: N/A  . Number of Children: 2  . Years of Education: N/A   Occupational History  . self employed    Social History Main Topics  . Smoking status: Never Smoker   . Smokeless tobacco: Never Used  . Alcohol Use: Yes     Comment: one occ   . Drug Use: No  . Sexual Activity: No   Other Topics Concern  . Not on file   Social History Narrative   2 caffeine drinks daily     Past Surgical History  Procedure Laterality Date  . Shoulder surgery      x 3     Family History  Problem Relation Age of Onset  . Hypertension Father   . Heart disease Father   . Prostate cancer Father     dx late 58s  . Cancer Father 4    prostate ca  . Celiac disease Mother   . Colon polyps Mother   . Breast cancer Mother   . Cancer Mother     Bladder  . Colon cancer Neg Hx     Allergies  Allergen Reactions  . Marinol [Dronabinol]     Delusions.    Current Outpatient Prescriptions on File Prior to Visit  Medication Sig Dispense Refill  . amLODipine  (NORVASC) 10 MG tablet TAKE 1 TABLET BY MOUTH ONCE DAILY 90 tablet 3  . amLODipine (NORVASC) 10 MG tablet TAKE 1 TABLET BY MOUTH ONCE DAILY 90 tablet 1  . EPIPEN 2-PAK 0.3 MG/0.3ML DEVI Inject 0.3 mg into the muscle as needed.     . fluticasone (FLONASE) 50 MCG/ACT nasal spray Place 1 spray into the nose.    Marland Kitchen omeprazole (PRILOSEC OTC) 20 MG tablet Take 20 mg by mouth daily.      . valACYclovir (VALTREX) 1000 MG tablet TAKE 1 TABLET BY MOUTH EVERY DAY (Patient taking differently: Take 1 tablet by mouth as needed.) 30 tablet 3   No current facility-administered medications on file prior to visit.    BP 122/80 mmHg  Pulse 87  Temp(Src) 98.5 F (36.9 C) (Oral)  Ht 5' 9.5" (1.765 m)  Wt 178 lb (80.74 kg)  BMI 25.92 kg/m2  SpO2 98%     Review of Systems  Constitutional: Negative for fever, chills, appetite change and fatigue.  HENT: Negative for congestion, dental problem, ear pain, hearing loss, sore throat, tinnitus, trouble swallowing and  voice change.   Eyes: Negative for pain, discharge and visual disturbance.  Respiratory: Negative for cough, chest tightness, wheezing and stridor.   Cardiovascular: Negative for chest pain, palpitations and leg swelling.  Gastrointestinal: Negative for nausea, vomiting, abdominal pain, diarrhea, constipation, blood in stool and abdominal distention.  Genitourinary: Negative for urgency, hematuria, flank pain, discharge, difficulty urinating and genital sores.  Musculoskeletal: Negative for myalgias, back pain, joint swelling, arthralgias, gait problem and neck stiffness.  Skin: Negative for rash.  Neurological: Negative for dizziness, syncope, speech difficulty, weakness, numbness and headaches.  Hematological: Negative for adenopathy. Does not bruise/bleed easily.  Psychiatric/Behavioral: Positive for sleep disturbance. Negative for behavioral problems and dysphoric mood. The patient is nervous/anxious.        Objective:   Physical Exam    Constitutional: He appears well-developed and well-nourished. No distress.  Psychiatric: He has a normal mood and affect. His behavior is normal. Judgment and thought content normal.          Assessment & Plan:  Insomnia/situational stress.  Many options discussed.  Elected to try short-term Ambien Hypertension, well-controlled  CPX 6 months

## 2015-07-20 NOTE — Progress Notes (Signed)
Pre visit review using our clinic review tool, if applicable. No additional management support is needed unless otherwise documented below in the visit note. 

## 2015-07-20 NOTE — Patient Instructions (Signed)
Limit your sodium (Salt) intake  Return in 6 months for follow-up  Insomnia Insomnia is a sleep disorder that makes it difficult to fall asleep or to stay asleep. Insomnia can cause tiredness (fatigue), low energy, difficulty concentrating, mood swings, and poor performance at work or school.  There are three different ways to classify insomnia:  Difficulty falling asleep.  Difficulty staying asleep.  Waking up too early in the morning. Any type of insomnia can be long-term (chronic) or short-term (acute). Both are common. Short-term insomnia usually lasts for three months or less. Chronic insomnia occurs at least three times a week for longer than three months. CAUSES  Insomnia may be caused by another condition, situation, or substance, such as:  Anxiety.  Certain medicines.  Gastroesophageal reflux disease (GERD) or other gastrointestinal conditions.  Asthma or other breathing conditions.  Restless legs syndrome, sleep apnea, or other sleep disorders.  Chronic pain.  Menopause. This may include hot flashes.  Stroke.  Abuse of alcohol, tobacco, or illegal drugs.  Depression.  Caffeine.   Neurological disorders, such as Alzheimer disease.  An overactive thyroid (hyperthyroidism). The cause of insomnia may not be known. RISK FACTORS Risk factors for insomnia include:  Gender. Women are more commonly affected than men.  Age. Insomnia is more common as you get older.  Stress. This may involve your professional or personal life.  Income. Insomnia is more common in people with lower income.  Lack of exercise.   Irregular work schedule or night shifts.  Traveling between different time zones. SIGNS AND SYMPTOMS If you have insomnia, trouble falling asleep or trouble staying asleep is the main symptom. This may lead to other symptoms, such as:  Feeling fatigued.  Feeling nervous about going to sleep.  Not feeling rested in the morning.  Having trouble  concentrating.  Feeling irritable, anxious, or depressed. TREATMENT  Treatment for insomnia depends on the cause. If your insomnia is caused by an underlying condition, treatment will focus on addressing the condition. Treatment may also include:   Medicines to help you sleep.  Counseling or therapy.  Lifestyle adjustments. HOME CARE INSTRUCTIONS   Take medicines only as directed by your health care provider.  Keep regular sleeping and waking hours. Avoid naps.  Keep a sleep diary to help you and your health care provider figure out what could be causing your insomnia. Include:   When you sleep.  When you wake up during the night.  How well you sleep.   How rested you feel the next day.  Any side effects of medicines you are taking.  What you eat and drink.   Make your bedroom a comfortable place where it is easy to fall asleep:  Put up shades or special blackout curtains to block light from outside.  Use a white noise machine to block noise.  Keep the temperature cool.   Exercise regularly as directed by your health care provider. Avoid exercising right before bedtime.  Use relaxation techniques to manage stress. Ask your health care provider to suggest some techniques that may work well for you. These may include:  Breathing exercises.  Routines to release muscle tension.  Visualizing peaceful scenes.  Cut back on alcohol, caffeinated beverages, and cigarettes, especially close to bedtime. These can disrupt your sleep.  Do not overeat or eat spicy foods right before bedtime. This can lead to digestive discomfort that can make it hard for you to sleep.  Limit screen use before bedtime. This includes:  Watching TV.  Using your smartphone, tablet, and computer.  Stick to a routine. This can help you fall asleep faster. Try to do a quiet activity, brush your teeth, and go to bed at the same time each night.  Get out of bed if you are still awake after 15  minutes of trying to sleep. Keep the lights down, but try reading or doing a quiet activity. When you feel sleepy, go back to bed.  Make sure that you drive carefully. Avoid driving if you feel very sleepy.  Keep all follow-up appointments as directed by your health care provider. This is important. SEEK MEDICAL CARE IF:   You are tired throughout the day or have trouble in your daily routine due to sleepiness.  You continue to have sleep problems or your sleep problems get worse. SEEK IMMEDIATE MEDICAL CARE IF:   You have serious thoughts about hurting yourself or someone else.   This information is not intended to replace advice given to you by your health care provider. Make sure you discuss any questions you have with your health care provider.   Document Released: 07/25/2000 Document Revised: 04/18/2015 Document Reviewed: 04/28/2014 Elsevier Interactive Patient Education [employer].

## 2015-09-28 ENCOUNTER — Other Ambulatory Visit (INDEPENDENT_AMBULATORY_CARE_PROVIDER_SITE_OTHER): Payer: BLUE CROSS/BLUE SHIELD

## 2015-09-28 DIAGNOSIS — Z0000 Encounter for general adult medical examination without abnormal findings: Secondary | ICD-10-CM | POA: Diagnosis not present

## 2015-09-28 LAB — CBC WITH DIFFERENTIAL/PLATELET
BASOS PCT: 0.5 % (ref 0.0–3.0)
Basophils Absolute: 0 10*3/uL (ref 0.0–0.1)
EOS ABS: 0.2 10*3/uL (ref 0.0–0.7)
EOS PCT: 4.5 % (ref 0.0–5.0)
HEMATOCRIT: 41.5 % (ref 39.0–52.0)
Hemoglobin: 14.2 g/dL (ref 13.0–17.0)
LYMPHS PCT: 28.1 % (ref 12.0–46.0)
Lymphs Abs: 1.2 10*3/uL (ref 0.7–4.0)
MCHC: 34.2 g/dL (ref 30.0–36.0)
MCV: 93.4 fl (ref 78.0–100.0)
MONOS PCT: 13.6 % — AB (ref 3.0–12.0)
Monocytes Absolute: 0.6 10*3/uL (ref 0.1–1.0)
NEUTROS ABS: 2.3 10*3/uL (ref 1.4–7.7)
Neutrophils Relative %: 53.3 % (ref 43.0–77.0)
PLATELETS: 343 10*3/uL (ref 150.0–400.0)
RBC: 4.44 Mil/uL (ref 4.22–5.81)
RDW: 12.9 % (ref 11.5–15.5)
WBC: 4.3 10*3/uL (ref 4.0–10.5)

## 2015-09-28 LAB — BASIC METABOLIC PANEL
BUN: 21 mg/dL (ref 6–23)
CHLORIDE: 104 meq/L (ref 96–112)
CO2: 32 meq/L (ref 19–32)
CREATININE: 1.22 mg/dL (ref 0.40–1.50)
Calcium: 9.7 mg/dL (ref 8.4–10.5)
GFR: 64.37 mL/min (ref >60.00)
Glucose, Bld: 115 mg/dL — ABNORMAL HIGH (ref 70–99)
POTASSIUM: 4.9 meq/L (ref 3.5–5.1)
Sodium: 143 mEq/L (ref 135–145)

## 2015-09-28 LAB — POC URINALSYSI DIPSTICK (AUTOMATED)
BILIRUBIN UA: NEGATIVE
Blood, UA: NEGATIVE
Glucose, UA: NEGATIVE
KETONES UA: NEGATIVE
Leukocytes, UA: NEGATIVE
Nitrite, UA: NEGATIVE
PH UA: 7
PROTEIN UA: NEGATIVE
SPEC GRAV UA: 1.015
Urobilinogen, UA: 0.2

## 2015-09-28 LAB — HEPATIC FUNCTION PANEL
ALBUMIN: 4.6 g/dL (ref 3.5–5.2)
ALT: 23 U/L (ref 0–53)
AST: 19 U/L (ref 0–37)
Alkaline Phosphatase: 43 U/L (ref 39–117)
Bilirubin, Direct: 0.1 mg/dL (ref 0.0–0.3)
TOTAL PROTEIN: 6.6 g/dL (ref 6.0–8.3)
Total Bilirubin: 0.5 mg/dL (ref 0.2–1.2)

## 2015-09-28 LAB — LIPID PANEL
CHOL/HDL RATIO: 3
Cholesterol: 202 mg/dL — ABNORMAL HIGH (ref 0–200)
HDL: 59 mg/dL (ref >39.00)
LDL Cholesterol: 124 mg/dL — ABNORMAL HIGH (ref 0–99)
NONHDL: 142.89
Triglycerides: 93 mg/dL (ref 0.0–149.0)
VLDL: 18.6 mg/dL (ref 0.0–40.0)

## 2015-09-28 LAB — TSH: TSH: 2.05 u[IU]/mL (ref 0.35–4.50)

## 2015-09-28 LAB — PSA: PSA: 0.82 ng/mL (ref 0.10–4.00)

## 2015-10-05 ENCOUNTER — Ambulatory Visit (INDEPENDENT_AMBULATORY_CARE_PROVIDER_SITE_OTHER): Payer: BLUE CROSS/BLUE SHIELD | Admitting: Internal Medicine

## 2015-10-05 ENCOUNTER — Encounter: Payer: Self-pay | Admitting: Internal Medicine

## 2015-10-05 VITALS — BP 126/80 | HR 80 | Temp 98.4°F | Resp 20 | Ht 69.5 in | Wt 177.0 lb

## 2015-10-05 DIAGNOSIS — E785 Hyperlipidemia, unspecified: Secondary | ICD-10-CM | POA: Diagnosis not present

## 2015-10-05 DIAGNOSIS — C9201 Acute myeloblastic leukemia, in remission: Secondary | ICD-10-CM | POA: Diagnosis not present

## 2015-10-05 DIAGNOSIS — I10 Essential (primary) hypertension: Secondary | ICD-10-CM

## 2015-10-05 DIAGNOSIS — Z0000 Encounter for general adult medical examination without abnormal findings: Secondary | ICD-10-CM

## 2015-10-05 DIAGNOSIS — R7301 Impaired fasting glucose: Secondary | ICD-10-CM

## 2015-10-05 MED ORDER — SERTRALINE HCL 50 MG PO TABS: 50.0000 mg | ORAL_TABLET | Freq: Every day | ORAL | Status: DC

## 2015-10-05 MED ORDER — AMLODIPINE BESYLATE 10 MG PO TABS: 10.0000 mg | ORAL_TABLET | Freq: Every day | ORAL | Status: DC

## 2015-10-05 MED ORDER — SILDENAFIL CITRATE 100 MG PO TABS: 50.0000 mg | ORAL_TABLET | ORAL | Status: DC | PRN

## 2015-10-05 NOTE — Progress Notes (Signed)
Pre visit review using our clinic review tool, if applicable. No additional management support is needed unless otherwise documented below in the visit note. 

## 2015-10-05 NOTE — Patient Instructions (Signed)
Limit your sodium (Salt) intake  Please check your blood pressure on a regular basis.  If it is consistently greater than 150/90, please make an office appointment.  Schedule your colonoscopy to help detect colon cancer.

## 2015-10-05 NOTE — Progress Notes (Signed)
Subjective:    Patient ID: [name], male    DOB: 04-10-1956, 60 y.o.   MRN: [id number]  HPI  Lab Results  Component Value Date   HGBA1C 5.9 02/21/2010   60 year old patient who is seen today for a preventive health exam  Remains in excellent health.  He doesn't health club at least twice weekly and works with a PT.  He is active with golf. No new concerns or complaints.  He has remote history of AML and is followed at [facility] annually.  He has a history of allergic rhinitis, essential hypertension.  Past Medical History  Diagnosis Date  . Anemia   . Esophageal reflux   . Diverticulosis of colon (without mention of hemorrhage)   . Family hx colonic polyps   . Hiatal hernia   . Psychosexual dysfunction with inhibited sexual excitement   . Hypertension   . Hyperlipemia   . Acute myeloid leukemia in remission ([place]) 1999  . History of kidney stones   . Depression   . Arthritis     Social History   Social History  . Marital Status: Married    Spouse Name: N/A  . Number of Children: 2  . Years of Education: N/A   Occupational History  . self employed    Social History Main Topics  . Smoking status: Never Smoker   . Smokeless tobacco: Never Used  . Alcohol Use: Yes     Comment: one occ   . Drug Use: No  . Sexual Activity: No   Other Topics Concern  . Not on file   Social History Narrative   2 caffeine drinks daily     Past Surgical History  Procedure Laterality Date  . Shoulder surgery      x 3     Family History  Problem Relation Age of Onset  . Hypertension Father   . Heart disease Father   . Prostate cancer Father     dx late 72s  . Cancer Father 50    prostate ca  . Celiac disease Mother   . Colon polyps Mother   . Breast cancer Mother   . Cancer Mother     Bladder  . Colon cancer Neg Hx     Allergies  Allergen Reactions  . Marinol [Dronabinol]     Delusions.    Current Outpatient Prescriptions on File Prior to Visit   Medication Sig Dispense Refill  . EPIPEN 2-PAK 0.3 MG/0.3ML DEVI Inject 0.3 mg into the muscle as needed.     . fluticasone (FLONASE) 50 MCG/ACT nasal spray Place 1 spray into the nose.    . Omega-3 Fatty Acids (FISH OIL PO) Take by mouth.    Marland Kitchen omeprazole (PRILOSEC OTC) 20 MG tablet Take 20 mg by mouth daily.      . valACYclovir (VALTREX) 1000 MG tablet TAKE 1 TABLET BY MOUTH EVERY DAY (Patient taking differently: Take 1 tablet by mouth as needed.) 30 tablet 3  . zolpidem (AMBIEN) 10 MG tablet Take 1 tablet (10 mg total) by mouth at bedtime as needed for sleep. 30 tablet 1   No current facility-administered medications on file prior to visit.    BP 126/80 mmHg  Pulse 80  Temp(Src) 98.4 F (36.9 C) (Oral)  Resp 20  Ht 5' 9.5" (1.765 m)  Wt 177 lb (80.287 kg)  BMI 25.77 kg/m2  SpO2 98%      Review of Systems  Constitutional: Negative for fever, chills, appetite  change and fatigue.  HENT: Negative for congestion, dental problem, ear pain, hearing loss, sore throat, tinnitus, trouble swallowing and voice change.   Eyes: Negative for pain, discharge and visual disturbance.  Respiratory: Negative for cough, chest tightness, wheezing and stridor.   Cardiovascular: Negative for chest pain, palpitations and leg swelling.  Gastrointestinal: Negative for nausea, vomiting, abdominal pain, diarrhea, constipation, blood in stool and abdominal distention.  Genitourinary: Negative for urgency, hematuria, flank pain, discharge, difficulty urinating and genital sores.  Musculoskeletal: Negative for myalgias, back pain, joint swelling, arthralgias, gait problem and neck stiffness.  Skin: Negative for rash.  Neurological: Negative for dizziness, syncope, speech difficulty, weakness, numbness and headaches.  Hematological: Negative for adenopathy. Does not bruise/bleed easily.  Psychiatric/Behavioral: Negative for behavioral problems and dysphoric mood. The patient is not nervous/anxious.          Objective:   Physical Exam  Constitutional: He appears well-developed and well-nourished.  HENT:  Head: Normocephalic and atraumatic.  Right Ear: External ear normal.  Left Ear: External ear normal.  Nose: Nose normal.  Mouth/Throat: Oropharynx is clear and moist.  Eyes: Conjunctivae and EOM are normal. Pupils are equal, round, and reactive to light. No scleral icterus.  Neck: Normal range of motion. Neck supple. No JVD present. No thyromegaly present.  Cardiovascular: Regular rhythm, normal heart sounds and intact distal pulses.  Exam reveals no gallop and no friction rub.   No murmur heard. Pulmonary/Chest: Effort normal and breath sounds normal. He exhibits no tenderness.  Abdominal: Soft. Bowel sounds are normal. He exhibits no distension and no mass. There is no tenderness.  Genitourinary: Prostate normal and penis normal. Guaiac negative stool.  Musculoskeletal: Normal range of motion. He exhibits no edema or tenderness.  Lymphadenopathy:    He has no cervical adenopathy.  Neurological: He is alert. He has normal reflexes. No cranial nerve deficit. Coordination normal.  Skin: Skin is warm and dry. No rash noted.  Psychiatric: He has a normal mood and affect. His behavior is normal.          Assessment & Plan:   Preventive health examination Hypertension, stable Impaired glucose tolerance, stable Remote history AML area remains in remission  Follow colonoscopy Continue home blood pressure monitoring Recheck one year

## 2015-11-15 DIAGNOSIS — M5136 Other intervertebral disc degeneration, lumbar region: Secondary | ICD-10-CM | POA: Diagnosis not present

## 2015-12-24 DIAGNOSIS — J301 Allergic rhinitis due to pollen: Secondary | ICD-10-CM | POA: Diagnosis not present

## 2015-12-27 DIAGNOSIS — J301 Allergic rhinitis due to pollen: Secondary | ICD-10-CM | POA: Diagnosis not present

## 2016-01-03 DIAGNOSIS — L821 Other seborrheic keratosis: Secondary | ICD-10-CM | POA: Diagnosis not present

## 2016-01-03 DIAGNOSIS — D485 Neoplasm of uncertain behavior of skin: Secondary | ICD-10-CM | POA: Diagnosis not present

## 2016-01-03 DIAGNOSIS — D225 Melanocytic nevi of trunk: Secondary | ICD-10-CM | POA: Diagnosis not present

## 2016-01-03 DIAGNOSIS — Z85828 Personal history of other malignant neoplasm of skin: Secondary | ICD-10-CM | POA: Diagnosis not present

## 2016-01-16 DIAGNOSIS — M542 Cervicalgia: Secondary | ICD-10-CM | POA: Diagnosis not present

## 2016-01-18 ENCOUNTER — Encounter: Payer: Self-pay | Admitting: Internal Medicine

## 2016-01-18 ENCOUNTER — Ambulatory Visit (INDEPENDENT_AMBULATORY_CARE_PROVIDER_SITE_OTHER): Payer: BLUE CROSS/BLUE SHIELD | Admitting: Internal Medicine

## 2016-01-18 VITALS — BP 148/88 | HR 77 | Temp 97.6°F | Ht 69.5 in | Wt 178.0 lb

## 2016-01-18 DIAGNOSIS — Z0000 Encounter for general adult medical examination without abnormal findings: Secondary | ICD-10-CM

## 2016-01-18 DIAGNOSIS — G4733 Obstructive sleep apnea (adult) (pediatric): Secondary | ICD-10-CM

## 2016-01-18 DIAGNOSIS — J301 Allergic rhinitis due to pollen: Secondary | ICD-10-CM | POA: Diagnosis not present

## 2016-01-18 DIAGNOSIS — I10 Essential (primary) hypertension: Secondary | ICD-10-CM | POA: Diagnosis not present

## 2016-01-18 DIAGNOSIS — E785 Hyperlipidemia, unspecified: Secondary | ICD-10-CM | POA: Diagnosis not present

## 2016-01-18 NOTE — Progress Notes (Signed)
Pre visit review using our clinic review tool, if applicable. No additional management support is needed unless otherwise documented below in the visit note. 

## 2016-01-18 NOTE — Patient Instructions (Signed)
Home sleep study and colonoscopy as discussed

## 2016-01-18 NOTE — Progress Notes (Signed)
Subjective:    Patient ID: [name], male    DOB: 09/10/1955, 60 y.o.   MRN: [id number]  HPI 60 year old patient who is seen today with OSA concerns.  A sleep study was ordered in 2014, but not performed due to lack of insurance coverage.  He is a loud snorer with disruptive sleep habits.  He has daytime sleepiness.  His father had OSA Needs follow-up colonoscopy History of essential hypertension which has been stable  Past Medical History  Diagnosis Date  . Anemia   . Esophageal reflux   . Diverticulosis of colon (without mention of hemorrhage)   . Family hx colonic polyps   . Hiatal hernia   . Psychosexual dysfunction with inhibited sexual excitement   . Hypertension   . Hyperlipemia   . Acute myeloid leukemia in remission ([place]) 1999  . History of kidney stones   . Depression   . Arthritis      Social History   Social History  . Marital Status: Married    Spouse Name: N/A  . Number of Children: 2  . Years of Education: N/A   Occupational History  . self employed    Social History Main Topics  . Smoking status: Never Smoker   . Smokeless tobacco: Never Used  . Alcohol Use: Yes     Comment: one occ   . Drug Use: No  . Sexual Activity: No   Other Topics Concern  . Not on file   Social History Narrative   2 caffeine drinks daily     Past Surgical History  Procedure Laterality Date  . Shoulder surgery      x 3     Family History  Problem Relation Age of Onset  . Hypertension Father   . Heart disease Father   . Prostate cancer Father     dx late 53s  . Cancer Father 61    prostate ca  . Celiac disease Mother   . Colon polyps Mother   . Breast cancer Mother   . Cancer Mother     Bladder  . Colon cancer Neg Hx     Allergies  Allergen Reactions  . Marinol [Dronabinol]     Delusions.    Current Outpatient Prescriptions on File Prior to Visit  Medication Sig Dispense Refill  . amLODipine (NORVASC) 10 MG tablet Take 1 tablet (10 mg  total) by mouth daily. 90 tablet 3  . EPIPEN 2-PAK 0.3 MG/0.3ML DEVI Inject 0.3 mg into the muscle as needed.     . fluticasone (FLONASE) 50 MCG/ACT nasal spray Place 1 spray into the nose.    Marland Kitchen omeprazole (PRILOSEC OTC) 20 MG tablet Take 20 mg by mouth daily.      . sertraline (ZOLOFT) 50 MG tablet Take 1 tablet (50 mg total) by mouth daily. 90 tablet 3  . sildenafil (VIAGRA) 100 MG tablet Take 0.5 tablets (50 mg total) by mouth as needed for erectile dysfunction. 10 tablet 6  . valACYclovir (VALTREX) 1000 MG tablet TAKE 1 TABLET BY MOUTH EVERY DAY (Patient taking differently: Take 1 tablet by mouth as needed.) 30 tablet 3   No current facility-administered medications on file prior to visit.    BP 148/88 mmHg  Pulse 77  Temp(Src) 97.6 F (36.4 C) (Oral)  Ht 5' 9.5" (1.765 m)  Wt 178 lb (80.74 kg)  BMI 25.92 kg/m2  SpO2 97%      Review of Systems  Constitutional: Positive for fatigue.  Psychiatric/Behavioral:  Positive for sleep disturbance.       Objective:   Physical Exam  Constitutional: He appears well-developed and well-nourished.  HENT:  Mild to moderate pharyngeal crowding          Assessment & Plan:   Essential hypertension Rule out OSA.  A home sleep study will be ordered Preventative health.  Follow colonoscopy ordered  Continue home blood pressure monitoring CPX in 8 months  [name], MD

## 2016-01-22 DIAGNOSIS — I10 Essential (primary) hypertension: Secondary | ICD-10-CM | POA: Diagnosis not present

## 2016-01-22 DIAGNOSIS — C9201 Acute myeloblastic leukemia, in remission: Secondary | ICD-10-CM | POA: Diagnosis not present

## 2016-01-22 DIAGNOSIS — Z9484 Stem cells transplant status: Secondary | ICD-10-CM | POA: Diagnosis not present

## 2016-01-22 DIAGNOSIS — D649 Anemia, unspecified: Secondary | ICD-10-CM | POA: Diagnosis not present

## 2016-01-29 ENCOUNTER — Other Ambulatory Visit: Payer: Self-pay | Admitting: Internal Medicine

## 2016-03-21 DIAGNOSIS — J301 Allergic rhinitis due to pollen: Secondary | ICD-10-CM | POA: Diagnosis not present

## 2016-03-24 DIAGNOSIS — J301 Allergic rhinitis due to pollen: Secondary | ICD-10-CM | POA: Diagnosis not present

## 2016-03-27 DIAGNOSIS — J301 Allergic rhinitis due to pollen: Secondary | ICD-10-CM | POA: Diagnosis not present

## 2016-03-31 DIAGNOSIS — M5136 Other intervertebral disc degeneration, lumbar region: Secondary | ICD-10-CM | POA: Diagnosis not present

## 2016-04-01 DIAGNOSIS — J301 Allergic rhinitis due to pollen: Secondary | ICD-10-CM | POA: Diagnosis not present

## 2016-04-04 DIAGNOSIS — J301 Allergic rhinitis due to pollen: Secondary | ICD-10-CM | POA: Diagnosis not present

## 2016-04-07 ENCOUNTER — Encounter: Payer: Self-pay | Admitting: Pulmonary Disease

## 2016-04-07 ENCOUNTER — Ambulatory Visit (INDEPENDENT_AMBULATORY_CARE_PROVIDER_SITE_OTHER): Payer: BLUE CROSS/BLUE SHIELD | Admitting: Pulmonary Disease

## 2016-04-07 VITALS — BP 126/70 | HR 77 | Ht 70.0 in | Wt 179.6 lb

## 2016-04-07 DIAGNOSIS — R0683 Snoring: Secondary | ICD-10-CM

## 2016-04-07 NOTE — Patient Instructions (Signed)
Will arrange for in sleep study Will call to arrange for follow up after sleep study reviewed  

## 2016-04-07 NOTE — Progress Notes (Signed)
Past Surgical History He  has a past surgical history that includes Shoulder surgery.  Allergies  Allergen Reactions  . Marinol [Dronabinol]     Delusions.    Family History His family history includes Breast cancer in his mother; Cancer in his mother; Cancer (age of onset: 96) in his father; Celiac disease in his mother; Colon polyps in his mother; Heart disease in his father; Hypertension in his father; Prostate cancer in his father.  Social History He  reports that he has never smoked. He has never used smokeless tobacco. He reports that he drinks alcohol. He reports that he does not use drugs.  Review of systems Constitutional: Negative for fever and unexpected weight change.  HENT: Negative for congestion, dental problem, ear pain, nosebleeds, postnasal drip, rhinorrhea, sinus pressure, sneezing, sore throat and trouble swallowing.   Eyes: Negative for redness and itching.  Respiratory: Negative for cough, chest tightness, shortness of breath and wheezing.   Cardiovascular: Negative for palpitations and leg swelling.  Gastrointestinal: Negative for nausea and vomiting.  Genitourinary: Negative for dysuria.  Musculoskeletal: Negative for joint swelling.  Skin: Negative for rash.  Neurological: Negative for headaches.  Hematological: Does not bruise/bleed easily.  Psychiatric/Behavioral: Negative for dysphoric mood. The patient is not nervous/anxious.     Current Outpatient Prescriptions on File Prior to Visit  Medication Sig  . amLODipine (NORVASC) 10 MG tablet Take 1 tablet (10 mg total) by mouth daily.  Marland Kitchen EPIPEN 2-PAK 0.3 MG/0.3ML DEVI Inject 0.3 mg into the muscle as needed.   Marland Kitchen omeprazole (PRILOSEC OTC) 20 MG tablet Take 20 mg by mouth daily.    . sertraline (ZOLOFT) 50 MG tablet Take 1 tablet (50 mg total) by mouth daily.  . sildenafil (VIAGRA) 100 MG tablet Take 0.5 tablets (50 mg total) by mouth as needed for erectile dysfunction.  Marland Kitchen UNABLE TO FIND 1 tablet 3 (three)  times daily. Med Name: Turmeric & Bromelain 1300mg -150mg   . valACYclovir (VALTREX) 1000 MG tablet TAKE 1 TABLET BY MOUTH EVERY DAY (Patient taking differently: TAKE 1 TABLET BY MOUTH EVERY DAY as needed)  . fluticasone (FLONASE) 50 MCG/ACT nasal spray Place 1 spray into the nose.   No current facility-administered medications on file prior to visit.     Chief Complaint  Patient presents with  . Advice Only    Pt reports he is not feeling rested when he wakes up, has bene told he snores/snort at night.    Cardiac tests Echo 03/18/12 >> EF 50%  Past medical history He  has a past medical history of Acute myeloid leukemia in remission ([name]) (1999); Anemia; Arthritis; Depression; Diverticulosis of colon (without mention of hemorrhage); Esophageal reflux; Family hx colonic polyps; Hiatal hernia; History of kidney stones; Hyperlipemia; Hypertension; and Psychosexual dysfunction with inhibited sexual excitement.  Vital signs BP 126/70 (BP Location: Left Arm, Cuff Size: Normal)   Pulse 77   Ht 5\' 10"  (1.778 m)   Wt 179 lb 9.6 oz (81.5 kg)   SpO2 99%   BMI 25.77 kg/m   History of Present Illness [name] is a 60 y.o. male for evaluation of sleep problems.  He has noticed trouble with his sleep for years.  This has been getting worse.  He is a very light sleeper.  He snores, and wakes up with a snort.  This happens mostly on his back.  His wife has told him he stops breathing.  He grinds his teeth and wears a mouth guard.  He goes  to sleep at 10 pm.  He falls asleep after 10 minutes.  He wakes up some times to use the bathroom.  He gets out of bed at 6 am.  He feels okay in the morning, but will fall asleep when he is reading.  He denies morning headache.  He does not use anything to help him fall sleep.  He drinks a few cups of coffee in the morning to help stay awake.  He denies sleep walking, sleep talking, bruxism, or nightmares.  There is no history of restless legs.  He denies  sleep hallucinations, sleep paralysis, or cataplexy.  The Epworth score is 6 out of 24.   Physical Exam:  General - No distress ENT - No sinus tenderness, no oral exudate, no LAN, no thyromegaly, TM clear, pupils equal/reactive, MP 4, scalloped tongue Cardiac - s1s2 regular, no murmur, pulses symmetric Chest - No wheeze/rales/dullness, good air entry, normal respiratory excursion Back - No focal tenderness Abd - Soft, non-tender, no organomegaly, + bowel sounds Ext - No edema Neuro - Normal strength, cranial nerves intact Skin - No rashes Psych - Normal mood, and behavior  Discussion: He has snoring, sleep disruption, daytime sleepiness, and witnessed apnea.  He has hx of depression, and hypertension.  He has family hx of sleep apnea.  I am concerned he could have sleep apnea also.  We discussed how sleep apnea can affect various health problems, including risks for hypertension, cardiovascular disease, and diabetes.  We also discussed how sleep disruption can increase risks for accidents, such as while driving.  Weight loss as a means of improving sleep apnea was also reviewed.  Additional treatment options discussed were CPAP therapy, oral appliance, and surgical intervention.   Assessment/plan:  Snoring with concern for obstructive sleep apnea. - will arrange for in lab sleep study >> he will check how much out of pocket expense for this is, and then decide if he would prefer to pay out of pocket for home sleep study - he would be good candidate for oral appliance   Patient Instructions  Will arrange for in sleep study Will call to arrange for follow up after sleep study reviewed     Chesley Mires, M.D. Pager [phone] 04/07/2016, 3:15 PM

## 2016-04-07 NOTE — Progress Notes (Signed)
   Subjective:    Patient ID: [name], male    DOB: 1956-06-22, 60 y.o.   MRN: [id number]  HPI    Review of Systems  Constitutional: Negative for fever and unexpected weight change.  HENT: Negative for congestion, dental problem, ear pain, nosebleeds, postnasal drip, rhinorrhea, sinus pressure, sneezing, sore throat and trouble swallowing.   Eyes: Negative for redness and itching.  Respiratory: Negative for cough, chest tightness, shortness of breath and wheezing.   Cardiovascular: Negative for palpitations and leg swelling.  Gastrointestinal: Negative for nausea and vomiting.  Genitourinary: Negative for dysuria.  Musculoskeletal: Negative for joint swelling.  Skin: Negative for rash.  Neurological: Negative for headaches.  Hematological: Does not bruise/bleed easily.  Psychiatric/Behavioral: Negative for dysphoric mood. The patient is not nervous/anxious.        Objective:   Physical Exam        Assessment & Plan:

## 2016-04-07 NOTE — Addendum Note (Signed)
Addended by: Inge Rise on: 04/07/2016 03:42 PM   Modules accepted: Orders

## 2016-04-08 DIAGNOSIS — J301 Allergic rhinitis due to pollen: Secondary | ICD-10-CM | POA: Diagnosis not present

## 2016-04-11 ENCOUNTER — Telehealth: Payer: Self-pay | Admitting: Pulmonary Disease

## 2016-04-11 DIAGNOSIS — J301 Allergic rhinitis due to pollen: Secondary | ICD-10-CM | POA: Diagnosis not present

## 2016-04-11 NOTE — Telephone Encounter (Signed)
Spoke with patient-states [employer] will over HST if [facility] will do a [employer] form (reapply) that they will approve test. PCC's please advise on reapplying for HST approval. If new order is needed then please let nurse know. Thanks.

## 2016-04-11 NOTE — Telephone Encounter (Signed)
This will need to be evaluated by Dr. Halford Chessman.

## 2016-04-11 NOTE — Telephone Encounter (Signed)
PCCs, may we have the forms for [facility] to complete?  Thanks!

## 2016-04-11 NOTE — Telephone Encounter (Signed)
[employer] form given to Dr. Halford Chessman to complete

## 2016-04-11 NOTE — Telephone Encounter (Signed)
I can fill in form.  Please provide me with necessary forms to complete.

## 2016-04-13 ENCOUNTER — Encounter (HOSPITAL_BASED_OUTPATIENT_CLINIC_OR_DEPARTMENT_OTHER): Payer: BLUE CROSS/BLUE SHIELD

## 2016-04-15 NOTE — Telephone Encounter (Signed)
Form completed.

## 2016-04-16 NOTE — Telephone Encounter (Signed)
CMN returned to [facility] (had a note asking to return completed form to [facility]).

## 2016-04-16 NOTE — Telephone Encounter (Signed)
Completed form & OVN dated 04/07/16 faxed to [facility].

## 2016-04-18 ENCOUNTER — Telehealth: Payer: Self-pay | Admitting: Pulmonary Disease

## 2016-04-18 NOTE — Telephone Encounter (Signed)
I see that the split was ordered today by Dr. Halford Chessman.  I called and made Patsy aware.  Will also forward to PCC's.

## 2016-04-18 NOTE — Telephone Encounter (Signed)
I sent message previously to [facility] and [facility] advising to have his scheduled for in lab split night study.

## 2016-04-18 NOTE — Telephone Encounter (Signed)
[name] w/ [employer] calling back Per [name], [employer] requires patients to meet 3 clinical requirements for HST to be covered: excessive daytime sleepiness, observed apneas and a BMI >35.  Pt meets all criteria except BMI so HST will not be covered However, in-lab sleep study will be covered  Dr Halford Chessman please advise, thank you.

## 2016-04-18 NOTE — Telephone Encounter (Signed)
Kathleen Lime Ottinger  Cozetta Seif Madelin Headings, CMA        ashyton I tried to explain to this pt but he said [employer] told him if dr sood would have in his notes it is medically necessary for him to do HST however when records were sent it there was nothing of that mentioned so pt thinks dr sood should document in his notes that it is medically necessary so he can do a home study

## 2016-04-18 NOTE — Telephone Encounter (Signed)
Spoke with Patsy T at [place], states she received duplicate information on a CMN for a HST for this pt- states that this will not be approved without new supporting information.  The pt's BMI does not meet medical policy of covering this procedure- must be greater than 35%.

## 2016-04-18 NOTE — Telephone Encounter (Signed)
This has been addressed with dr Halford Chessman and he has given ashyton a message to handle it [name]

## 2016-04-18 NOTE — Telephone Encounter (Signed)
Left message on voicemail for [name] at [facility] to call back.

## 2016-04-18 NOTE — Telephone Encounter (Signed)
[name] with [employer] calling back

## 2016-04-18 NOTE — Telephone Encounter (Signed)
lmtcb for [name].

## 2016-04-18 NOTE — Telephone Encounter (Signed)
-----   Message from Chesley Mires, MD sent at 04/18/2016  7:20 AM EDT ----- [name], Can you let him know that [facility] won't pay for home sleep study and set him up for an in lab split night sleep study.  Thanks.  V  ----- Message ----- From: Joellen Jersey Sent: 04/17/2016   4:51 PM To: Chesley Mires, MD, [facility] Madelin Headings, CMA  [employer] DENIED THIS HST AND ANOTHER ONE WAS PUT IN I DONT KNOW HOW WE ALL GOT MIXED UP BUT I NEED THIS PT TO DO AN IN LAB STUDY SPLIT NIGHT WILL BW OK  THANKS LIBBY

## 2016-04-22 NOTE — Telephone Encounter (Signed)
Dr Halford Chessman please advise on Addendum in the [facility] notes for insurance purposes. Thanks.

## 2016-04-23 ENCOUNTER — Other Ambulatory Visit: Payer: Self-pay | Admitting: Pulmonary Disease

## 2016-04-23 DIAGNOSIS — G4733 Obstructive sleep apnea (adult) (pediatric): Secondary | ICD-10-CM

## 2016-04-23 NOTE — Telephone Encounter (Signed)
Noted  

## 2016-04-23 NOTE — Telephone Encounter (Signed)
Spoke to dr sood and to pt pt is aware dr Halford Chessman does not feel it is medically necessary for him to do a home sleep study his insurance wants him to do an in lab study a split night study has been scheduled for 06/10/16 and mailed paperwork to pt,  pt is aware Joellen Jersey

## 2016-04-30 ENCOUNTER — Encounter: Payer: Self-pay | Admitting: Internal Medicine

## 2016-05-05 MED ORDER — SILDENAFIL CITRATE 100 MG PO TABS: 50.0000 mg | ORAL_TABLET | ORAL | 6 refills | Status: DC | PRN

## 2016-05-05 NOTE — Telephone Encounter (Signed)
Left message on personal voicemail Rx ready for pickup will be at the front desk. Rx printed and signed.  

## 2016-05-09 DIAGNOSIS — H43813 Vitreous degeneration, bilateral: Secondary | ICD-10-CM | POA: Diagnosis not present

## 2016-05-19 DIAGNOSIS — J3089 Other allergic rhinitis: Secondary | ICD-10-CM | POA: Diagnosis not present

## 2016-05-19 DIAGNOSIS — I10 Essential (primary) hypertension: Secondary | ICD-10-CM | POA: Diagnosis not present

## 2016-05-19 DIAGNOSIS — G4719 Other hypersomnia: Secondary | ICD-10-CM | POA: Diagnosis not present

## 2016-05-19 DIAGNOSIS — R0683 Snoring: Secondary | ICD-10-CM | POA: Diagnosis not present

## 2016-05-19 DIAGNOSIS — G478 Other sleep disorders: Secondary | ICD-10-CM | POA: Diagnosis not present

## 2016-05-29 DIAGNOSIS — G4710 Hypersomnia, unspecified: Secondary | ICD-10-CM | POA: Diagnosis not present

## 2016-06-09 DIAGNOSIS — M5136 Other intervertebral disc degeneration, lumbar region: Secondary | ICD-10-CM | POA: Diagnosis not present

## 2016-06-10 ENCOUNTER — Encounter (HOSPITAL_BASED_OUTPATIENT_CLINIC_OR_DEPARTMENT_OTHER): Payer: BLUE CROSS/BLUE SHIELD

## 2016-06-25 DIAGNOSIS — J301 Allergic rhinitis due to pollen: Secondary | ICD-10-CM | POA: Diagnosis not present

## 2016-07-02 DIAGNOSIS — J301 Allergic rhinitis due to pollen: Secondary | ICD-10-CM | POA: Diagnosis not present

## 2016-07-02 DIAGNOSIS — J3089 Other allergic rhinitis: Secondary | ICD-10-CM | POA: Diagnosis not present

## 2016-07-02 DIAGNOSIS — K219 Gastro-esophageal reflux disease without esophagitis: Secondary | ICD-10-CM | POA: Diagnosis not present

## 2016-07-10 DIAGNOSIS — M25522 Pain in left elbow: Secondary | ICD-10-CM | POA: Diagnosis not present

## 2016-07-16 ENCOUNTER — Other Ambulatory Visit: Payer: Self-pay | Admitting: Internal Medicine

## 2016-07-18 DIAGNOSIS — M25522 Pain in left elbow: Secondary | ICD-10-CM | POA: Diagnosis not present

## 2016-08-01 DIAGNOSIS — J301 Allergic rhinitis due to pollen: Secondary | ICD-10-CM | POA: Diagnosis not present

## 2016-08-20 DIAGNOSIS — M25522 Pain in left elbow: Secondary | ICD-10-CM | POA: Diagnosis not present

## 2016-08-21 ENCOUNTER — Encounter: Payer: Self-pay | Admitting: Internal Medicine

## 2016-08-21 DIAGNOSIS — J301 Allergic rhinitis due to pollen: Secondary | ICD-10-CM | POA: Diagnosis not present

## 2016-08-22 ENCOUNTER — Other Ambulatory Visit: Payer: Self-pay | Admitting: Internal Medicine

## 2016-08-22 MED ORDER — AMLODIPINE BESYLATE 10 MG PO TABS: 10.0000 mg | ORAL_TABLET | Freq: Every day | ORAL | 3 refills | Status: DC

## 2016-08-25 DIAGNOSIS — J301 Allergic rhinitis due to pollen: Secondary | ICD-10-CM | POA: Diagnosis not present

## 2016-08-28 DIAGNOSIS — M25522 Pain in left elbow: Secondary | ICD-10-CM | POA: Diagnosis not present

## 2016-09-03 DIAGNOSIS — J301 Allergic rhinitis due to pollen: Secondary | ICD-10-CM | POA: Diagnosis not present

## 2016-09-11 DIAGNOSIS — J301 Allergic rhinitis due to pollen: Secondary | ICD-10-CM | POA: Diagnosis not present

## 2016-09-17 DIAGNOSIS — J301 Allergic rhinitis due to pollen: Secondary | ICD-10-CM | POA: Diagnosis not present

## 2016-09-18 DIAGNOSIS — M25522 Pain in left elbow: Secondary | ICD-10-CM | POA: Diagnosis not present

## 2016-10-03 ENCOUNTER — Other Ambulatory Visit (INDEPENDENT_AMBULATORY_CARE_PROVIDER_SITE_OTHER): Payer: BLUE CROSS/BLUE SHIELD

## 2016-10-03 DIAGNOSIS — Z0000 Encounter for general adult medical examination without abnormal findings: Secondary | ICD-10-CM

## 2016-10-03 DIAGNOSIS — J301 Allergic rhinitis due to pollen: Secondary | ICD-10-CM | POA: Diagnosis not present

## 2016-10-03 LAB — CBC WITH DIFFERENTIAL/PLATELET
BASOS PCT: 1.1 % (ref 0.0–3.0)
Basophils Absolute: 0 10*3/uL (ref 0.0–0.1)
EOS ABS: 0.2 10*3/uL (ref 0.0–0.7)
Eosinophils Relative: 4.8 % (ref 0.0–5.0)
HCT: 40.5 % (ref 39.0–52.0)
HEMOGLOBIN: 13.9 g/dL (ref 13.0–17.0)
LYMPHS ABS: 1.1 10*3/uL (ref 0.7–4.0)
Lymphocytes Relative: 29.1 % (ref 12.0–46.0)
MCHC: 34.4 g/dL (ref 30.0–36.0)
MCV: 95.5 fl (ref 78.0–100.0)
MONO ABS: 0.5 10*3/uL (ref 0.1–1.0)
Monocytes Relative: 13 % — ABNORMAL HIGH (ref 3.0–12.0)
NEUTROS PCT: 52 % (ref 43.0–77.0)
Neutro Abs: 1.9 10*3/uL (ref 1.4–7.7)
Platelets: 328 10*3/uL (ref 150.0–400.0)
RBC: 4.23 Mil/uL (ref 4.22–5.81)
RDW: 12.6 % (ref 11.5–15.5)
WBC: 3.6 10*3/uL — AB (ref 4.0–10.5)

## 2016-10-03 LAB — POC URINALSYSI DIPSTICK (AUTOMATED)
BILIRUBIN UA: NEGATIVE
Blood, UA: NEGATIVE
GLUCOSE UA: NEGATIVE
KETONES UA: NEGATIVE
LEUKOCYTES UA: NEGATIVE
Nitrite, UA: NEGATIVE
Protein, UA: NEGATIVE
Spec Grav, UA: 1.02
Urobilinogen, UA: 0.2
pH, UA: 7

## 2016-10-03 LAB — BASIC METABOLIC PANEL
BUN: 21 mg/dL (ref 6–23)
CO2: 30 mEq/L (ref 19–32)
CREATININE: 1.14 mg/dL (ref 0.40–1.50)
Calcium: 9.4 mg/dL (ref 8.4–10.5)
Chloride: 106 mEq/L (ref 96–112)
GFR: 69.37 mL/min (ref >60.00)
GLUCOSE: 111 mg/dL — AB (ref 70–99)
Potassium: 4.6 mEq/L (ref 3.5–5.1)
Sodium: 142 mEq/L (ref 135–145)

## 2016-10-03 LAB — LIPID PANEL
CHOL/HDL RATIO: 4
CHOLESTEROL: 211 mg/dL — AB (ref 0–200)
HDL: 54.2 mg/dL (ref >39.00)
LDL CALC: 139 mg/dL — AB (ref 0–99)
NonHDL: 156.41
TRIGLYCERIDES: 88 mg/dL (ref 0.0–149.0)
VLDL: 17.6 mg/dL (ref 0.0–40.0)

## 2016-10-03 LAB — HEPATIC FUNCTION PANEL
ALT: 22 U/L (ref 0–53)
AST: 20 U/L (ref 0–37)
Albumin: 4.6 g/dL (ref 3.5–5.2)
Alkaline Phosphatase: 41 U/L (ref 39–117)
BILIRUBIN DIRECT: 0.1 mg/dL (ref 0.0–0.3)
BILIRUBIN TOTAL: 0.4 mg/dL (ref 0.2–1.2)
Total Protein: 6.5 g/dL (ref 6.0–8.3)

## 2016-10-03 LAB — PSA: PSA: 0.83 ng/mL (ref 0.10–4.00)

## 2016-10-03 LAB — TSH: TSH: 2.73 u[IU]/mL (ref 0.35–4.50)

## 2016-10-09 DIAGNOSIS — M25522 Pain in left elbow: Secondary | ICD-10-CM | POA: Diagnosis not present

## 2016-10-10 ENCOUNTER — Encounter: Payer: Self-pay | Admitting: Internal Medicine

## 2016-10-10 ENCOUNTER — Ambulatory Visit (INDEPENDENT_AMBULATORY_CARE_PROVIDER_SITE_OTHER): Payer: BLUE CROSS/BLUE SHIELD | Admitting: Internal Medicine

## 2016-10-10 VITALS — BP 124/82 | HR 85 | Temp 97.7°F | Ht 70.0 in | Wt 179.0 lb

## 2016-10-10 DIAGNOSIS — Z0000 Encounter for general adult medical examination without abnormal findings: Secondary | ICD-10-CM | POA: Diagnosis not present

## 2016-10-10 NOTE — Patient Instructions (Addendum)
Limit your sodium (Salt) intake  Please check your blood pressure on a regular basis.  If it is consistently greater than 150/90, please make an office appointment.    It is important that you exercise regularly, at least 20 minutes 3 to 4 times per week.  If you develop chest pain or shortness of breath seek  medical attention.  Return in one year for follow-up   Schedule your colonoscopy to help detect colon cancer.  Return in one year for follow-up     Health Maintenance, Male A healthy lifestyle and preventive care is important for your health and wellness. Ask your health care provider about what schedule of regular examinations is right for you. What should I know about weight and diet?  Eat a Healthy Diet  Eat plenty of vegetables, fruits, whole grains, low-fat dairy products, and lean protein.  Do not eat a lot of foods high in solid fats, added sugars, or salt. Maintain a Healthy Weight  Regular exercise can help you achieve or maintain a healthy weight. You should:  Do at least 150 minutes of exercise each week. The exercise should increase your heart rate and make you sweat (moderate-intensity exercise).  Do strength-training exercises at least twice a week. Watch Your Levels of Cholesterol and Blood Lipids  Have your blood tested for lipids and cholesterol every 5 years starting at 62 years of age. If you are at high risk for heart disease, you should start having your blood tested when you are 62 years old. You may need to have your cholesterol levels checked more often if:  Your lipid or cholesterol levels are high.  You are older than 62 years of age.  You are at high risk for heart disease. What should I know about cancer screening? Many types of cancers can be detected early and may often be prevented. Lung Cancer  You should be screened every year for lung cancer if:  You are a current smoker who has smoked for at least 30 years.  You are a former  smoker who has quit within the past 15 years.  Talk to your health care provider about your screening options, when you should start screening, and how often you should be screened. Colorectal Cancer  Routine colorectal cancer screening usually begins at 62 years of age and should be repeated every 5-10 years until you are 62 years old. You may need to be screened more often if early forms of precancerous polyps or small growths are found. Your health care provider may recommend screening at an earlier age if you have risk factors for colon cancer.  Your health care provider may recommend using home test kits to check for hidden blood in the stool.  A small camera at the end of a tube can be used to examine your colon (sigmoidoscopy or colonoscopy). This checks for the earliest forms of colorectal cancer. Prostate and Testicular Cancer  Depending on your age and overall health, your health care provider may do certain tests to screen for prostate and testicular cancer.  Talk to your health care provider about any symptoms or concerns you have about testicular or prostate cancer. Skin Cancer  Check your skin from head to toe regularly.  Tell your health care provider about any new moles or changes in moles, especially if:  There is a change in a mole's size, shape, or color.  You have a mole that is larger than a pencil eraser.  Always use sunscreen. Apply  sunscreen liberally and repeat throughout the day.  Protect yourself by wearing long sleeves, pants, a wide-brimmed hat, and sunglasses when outside. What should I know about heart disease, diabetes, and high blood pressure?  If you are 77-73 years of age, have your blood pressure checked every 3-5 years. If you are 43 years of age or older, have your blood pressure checked every year. You should have your blood pressure measured twice-once when you are at a hospital or clinic, and once when you are not at a hospital or clinic. Record  the average of the two measurements. To check your blood pressure when you are not at a hospital or clinic, you can use:  An automated blood pressure machine at a pharmacy.  A home blood pressure monitor.  Talk to your health care provider about your target blood pressure.  If you are between 1-6 years old, ask your health care provider if you should take aspirin to prevent heart disease.  Have regular diabetes screenings by checking your fasting blood sugar level.  If you are at a normal weight and have a low risk for diabetes, have this test once every three years after the age of 29.  If you are overweight and have a high risk for diabetes, consider being tested at a younger age or more often.  A one-time screening for abdominal aortic aneurysm (AAA) by ultrasound is recommended for men aged 51-75 years who are current or former smokers. What should I know about preventing infection? Hepatitis B  If you have a higher risk for hepatitis B, you should be screened for this virus. Talk with your health care provider to find out if you are at risk for hepatitis B infection. Hepatitis C  Blood testing is recommended for:  Everyone born from [birth year] through 1965.  Anyone with known risk factors for hepatitis C. Sexually Transmitted Diseases (STDs)  You should be screened each year for STDs including gonorrhea and chlamydia if:  You are sexually active and are younger than 62 years of age.  You are older than 62 years of age and your health care provider tells you that you are at risk for this type of infection.  Your sexual activity has changed since you were last screened and you are at an increased risk for chlamydia or gonorrhea. Ask your health care provider if you are at risk.  Talk with your health care provider about whether you are at high risk of being infected with HIV. Your health care provider may recommend a prescription medicine to help prevent HIV infection. What else  can I do?  Schedule regular health, dental, and eye exams.  Stay current with your vaccines (immunizations).  Do not use any tobacco products, such as cigarettes, chewing tobacco, and e-cigarettes. If you need help quitting, ask your health care provider.  Limit alcohol intake to no more than 2 drinks per day. One drink equals 12 ounces of beer, 5 ounces of wine, or 1 ounces of hard liquor.  Do not use street drugs.  Do not share needles.  Ask your health care provider for help if you need support or information about quitting drugs.  Tell your health care provider if you often feel depressed.  Tell your health care provider if you have ever been abused or do not feel safe at home. This information is not intended to replace advice given to you by your health care provider. Make sure you discuss any questions you have with your  health care provider. Document Released: 01/24/2008 Document Revised: 03/26/2016 Document Reviewed: 05/01/2015 Elsevier Interactive Patient Education  2017 [employer].

## 2016-10-10 NOTE — Progress Notes (Signed)
Subjective:    Patient ID: [name], male    DOB: [birth year], 61 y.o.   MRN: [id number]  HPI  61 year old patient who is seen today for a preventive health examination. He has remote history of AML and is followed annually at [facility]. He has a history of treated hypertension and impaired glucose tolerance. He is doing quite well today.  He adheres to a heart healthy diet and exercises regularly.  No cardiopulmonary complaints  Last colonoscopy 2007  Family history both parents died in their eighties.  Father had coronary artery disease in died of complications of upper GI bleed.  Had a history of prostate cancer Mother died of complications of a cerebral vascular accident.  Apparently had a history of breast bladder and renal cancer.  Both parents used tobacco products paternal uncle and grandfather also had coronary artery disease  Past Medical History:  Diagnosis Date  . Acute myeloid leukemia in remission ([place]) 1999  . Anemia   . Arthritis   . Depression   . Diverticulosis of colon (without mention of hemorrhage)   . Esophageal reflux   . Family hx colonic polyps   . Hiatal hernia   . History of kidney stones   . Hyperlipemia   . Hypertension   . Psychosexual dysfunction with inhibited sexual excitement      Social History   Social History  . Marital status: Married    Spouse name: N/A  . Number of children: 2  . Years of education: N/A   Occupational History  . self employed [place] History Main Topics  . Smoking status: Never Smoker  . Smokeless tobacco: Never Used  . Alcohol use Yes     Comment: one occ   . Drug use: No  . Sexual activity: No   Other Topics Concern  . Not on file   Social History Narrative   2 caffeine drinks daily     Past Surgical History:  Procedure Laterality Date  . SHOULDER SURGERY     x 3     Family History  Problem Relation Age of Onset  . Hypertension Father   . Heart disease Father     . Prostate cancer Father     dx late 60s  . Cancer Father 41    prostate ca  . Celiac disease Mother   . Colon polyps Mother   . Breast cancer Mother   . Cancer Mother     Bladder  . Colon cancer Neg Hx     Allergies  Allergen Reactions  . Marinol [Dronabinol]     Delusions.    Current Outpatient Prescriptions on File Prior to Visit  Medication Sig Dispense Refill  . amLODipine (NORVASC) 10 MG tablet Take 1 tablet (10 mg total) by mouth daily. 90 tablet 3  . EPIPEN 2-PAK 0.3 MG/0.3ML DEVI Inject 0.3 mg into the muscle as needed.     . fluticasone (FLONASE) 50 MCG/ACT nasal spray Place 1 spray into the nose.    . Nutritional Supplements (JUICE PLUS FIBRE PO) Take by mouth. 2 tabs twice daily    . omeprazole (PRILOSEC OTC) 20 MG tablet Take 20 mg by mouth daily.      . Saw Palmetto, Serenoa repens, (SAW PALMETTO PO) Take by mouth.    . sertraline (ZOLOFT) 50 MG tablet Take 1 tablet (50 mg total) by mouth daily. 90 tablet 3  . sildenafil (VIAGRA) 100 MG tablet Take 0.5 tablets (50  mg total) by mouth as needed for erectile dysfunction. 10 tablet 6  . UNABLE TO FIND 1 tablet 3 (three) times daily. Med Name: Turmeric & Bromelain 1300mg -150mg     . valACYclovir (VALTREX) 1000 MG tablet TAKE 1 TABLET BY MOUTH EVERY DAY 30 tablet 2   No current facility-administered medications on file prior to visit.     BP 124/82 (BP Location: Left Arm, Patient Position: Sitting, Cuff Size: Normal)   Pulse 85   Temp 97.7 F (36.5 C) (Oral)   Ht 5\' 10"  (1.778 m)   Wt 179 lb (81.2 kg)   SpO2 99%   BMI 25.68 kg/m    Review of Systems  Constitutional: Negative for appetite change, chills, fatigue and fever.  HENT: Positive for congestion. Negative for dental problem, ear pain, hearing loss, sore throat, tinnitus, trouble swallowing and voice change.   Eyes: Negative for pain, discharge and visual disturbance.  Respiratory: Positive for cough. Negative for chest tightness, wheezing and stridor.    Cardiovascular: Negative for chest pain, palpitations and leg swelling.  Gastrointestinal: Negative for abdominal distention, abdominal pain, blood in stool, constipation, diarrhea, nausea and vomiting.  Genitourinary: Negative for difficulty urinating, discharge, flank pain, genital sores, hematuria and urgency.  Musculoskeletal: Negative for arthralgias, back pain, gait problem, joint swelling, myalgias and neck stiffness.  Skin: Negative for rash.  Neurological: Negative for dizziness, syncope, speech difficulty, weakness, numbness and headaches.  Hematological: Negative for adenopathy. Does not bruise/bleed easily.  Psychiatric/Behavioral: Negative for behavioral problems and dysphoric mood. The patient is not nervous/anxious.        Objective:   Physical Exam  Constitutional: He appears well-developed and well-nourished.  HENT:  Head: Normocephalic and atraumatic.  Right Ear: External ear normal.  Left Ear: External ear normal.  Nose: Nose normal.  Mouth/Throat: Oropharynx is clear and moist.  Eyes: Conjunctivae and EOM are normal. Pupils are equal, round, and reactive to light. No scleral icterus.  Neck: Normal range of motion. Neck supple. No JVD present. No thyromegaly present.  Cardiovascular: Regular rhythm, normal heart sounds and intact distal pulses.  Exam reveals no gallop and no friction rub.   No murmur heard. Pulmonary/Chest: Effort normal and breath sounds normal. He exhibits no tenderness.  Abdominal: Soft. Bowel sounds are normal. He exhibits no distension and no mass. There is no tenderness.  Genitourinary: Prostate normal and penis normal.  Musculoskeletal: Normal range of motion. He exhibits no edema or tenderness.  Lymphadenopathy:    He has no cervical adenopathy.  Neurological: He is alert. He has normal reflexes. No cranial nerve deficit. Coordination normal.  Skin: Skin is warm and dry. No rash noted.  Psychiatric: He has a normal mood and affect. His  behavior is normal.          Assessment & Plan:   Preventive health examination Remote history AML.  [facility]  Needs screening colonoscopy.  We'll schedule Essential hypertension, well-controlled Impaired glucose tolerance.  We'll continue to monitor.  We'll continue efforts at active lifestyle and maintain ideal body weight  Colonoscopy No change in medical regimen Follow-up one year  [name]

## 2016-10-10 NOTE — Progress Notes (Signed)
Pre visit review using our clinic review tool, if applicable. No additional management support is needed unless otherwise documented below in the visit note. 

## 2016-10-13 ENCOUNTER — Other Ambulatory Visit: Payer: Self-pay | Admitting: Internal Medicine

## 2016-10-14 ENCOUNTER — Other Ambulatory Visit: Payer: Self-pay | Admitting: Internal Medicine

## 2016-10-14 MED ORDER — HYDROCODONE-HOMATROPINE 5-1.5 MG/5ML PO SYRP: 5.0000 mL | ORAL_SOLUTION | Freq: Four times a day (QID) | ORAL | 0 refills | Status: DC | PRN

## 2016-10-14 MED ORDER — ATORVASTATIN CALCIUM 20 MG PO TABS: 20.0000 mg | ORAL_TABLET | Freq: Every day | ORAL | 3 refills | Status: DC

## 2016-10-14 MED ORDER — SILDENAFIL CITRATE 100 MG PO TABS: 100.0000 mg | ORAL_TABLET | Freq: Every day | ORAL | 3 refills | Status: DC | PRN

## 2016-10-15 ENCOUNTER — Other Ambulatory Visit: Payer: Self-pay | Admitting: Internal Medicine

## 2016-10-20 DIAGNOSIS — K219 Gastro-esophageal reflux disease without esophagitis: Secondary | ICD-10-CM | POA: Diagnosis not present

## 2016-10-20 DIAGNOSIS — J301 Allergic rhinitis due to pollen: Secondary | ICD-10-CM | POA: Diagnosis not present

## 2016-10-20 DIAGNOSIS — J3089 Other allergic rhinitis: Secondary | ICD-10-CM | POA: Diagnosis not present

## 2016-10-20 DIAGNOSIS — J019 Acute sinusitis, unspecified: Secondary | ICD-10-CM | POA: Diagnosis not present

## 2016-10-21 DIAGNOSIS — M25522 Pain in left elbow: Secondary | ICD-10-CM | POA: Diagnosis not present

## 2016-10-29 DIAGNOSIS — J301 Allergic rhinitis due to pollen: Secondary | ICD-10-CM | POA: Diagnosis not present

## 2016-11-05 ENCOUNTER — Telehealth: Payer: Self-pay | Admitting: Internal Medicine

## 2016-11-05 MED ORDER — PRAVASTATIN SODIUM 20 MG PO TABS: 20.0000 mg | ORAL_TABLET | Freq: Every day | ORAL | 3 refills | Status: DC

## 2016-11-05 NOTE — Telephone Encounter (Signed)
Pt states he cannot take the rx  atorvastatin (LIPITOR) 20 MG tablet  Pt states it is giving him body aches.  He went off for a week, the body aches got better,  Pt resumed taking and the pain resumed. Pt would like to know if you want to prescribe something else.   [facility]  (please note [facility] is the [facility])

## 2016-11-05 NOTE — Telephone Encounter (Signed)
Please see message below, please advise 

## 2016-11-05 NOTE — Telephone Encounter (Signed)
Pravastatin 20 mg #30 one daily.  If similar symptoms occur, will also need to discontinue this medication

## 2016-11-05 NOTE — Telephone Encounter (Signed)
New Rx for Pravastatin 20 mg #30 one daily was [facility] to [facility]. Called pr to inform of medication change, pt verbalized understanding

## 2016-11-17 ENCOUNTER — Other Ambulatory Visit: Payer: Self-pay | Admitting: Internal Medicine

## 2016-11-18 DIAGNOSIS — M25552 Pain in left hip: Secondary | ICD-10-CM | POA: Diagnosis not present

## 2016-11-18 DIAGNOSIS — M25522 Pain in left elbow: Secondary | ICD-10-CM | POA: Diagnosis not present

## 2016-11-19 DIAGNOSIS — J301 Allergic rhinitis due to pollen: Secondary | ICD-10-CM | POA: Diagnosis not present

## 2016-12-02 DIAGNOSIS — M25522 Pain in left elbow: Secondary | ICD-10-CM | POA: Diagnosis not present

## 2016-12-02 DIAGNOSIS — M25552 Pain in left hip: Secondary | ICD-10-CM | POA: Diagnosis not present

## 2016-12-09 ENCOUNTER — Ambulatory Visit (INDEPENDENT_AMBULATORY_CARE_PROVIDER_SITE_OTHER): Payer: BLUE CROSS/BLUE SHIELD | Admitting: Internal Medicine

## 2016-12-09 ENCOUNTER — Encounter: Payer: Self-pay | Admitting: Internal Medicine

## 2016-12-09 VITALS — BP 128/88 | HR 72 | Temp 97.9°F | Wt 176.6 lb

## 2016-12-09 DIAGNOSIS — T7840XA Allergy, unspecified, initial encounter: Secondary | ICD-10-CM

## 2016-12-09 MED ORDER — METHYLPREDNISOLONE ACETATE 80 MG/ML IJ SUSP
80.0000 mg | Freq: Once | INTRAMUSCULAR | Status: AC
  Administered 2016-12-09: 80 mg via INTRAMUSCULAR

## 2016-12-09 NOTE — Progress Notes (Signed)
Pre visit review using our clinic review tool, if applicable. No additional management support is needed unless otherwise documented below in the visit note. 

## 2016-12-09 NOTE — Progress Notes (Signed)
Subjective:    Patient ID: [name], male    DOB: 03-15-1956, 61 y.o.   MRN: [id number]  HPI  61 year old patient who receives immunotherapy for allergy issues. Yesterday while doing yard work.  He felt that he sustained a bite involving the right lower arm just distal to the elbow.  He has had itching and soft tissue swelling involving the right lower arm  Past Medical History:  Diagnosis Date  . Acute myeloid leukemia in remission ([place]) 1999  . Anemia   . Arthritis   . Depression   . Diverticulosis of colon (without mention of hemorrhage)   . Esophageal reflux   . Family hx colonic polyps   . Hiatal hernia   . History of kidney stones   . Hyperlipemia   . Hypertension   . Psychosexual dysfunction with inhibited sexual excitement      Social History   Social History  . Marital status: Married    Spouse name: N/A  . Number of children: 2  . Years of education: N/A   Occupational History  . self employed [employer] History Main Topics  . Smoking status: Never Smoker  . Smokeless tobacco: Never Used  . Alcohol use Yes     Comment: one occ   . Drug use: No  . Sexual activity: No   Other Topics Concern  . Not on file   Social History Narrative   2 caffeine drinks daily     Past Surgical History:  Procedure Laterality Date  . SHOULDER SURGERY     x 3     Family History  Problem Relation Age of Onset  . Hypertension Father   . Heart disease Father   . Prostate cancer Father     dx late 49s  . Cancer Father 43    prostate ca  . Celiac disease Mother   . Colon polyps Mother   . Breast cancer Mother   . Cancer Mother     Bladder  . Colon cancer Neg Hx     Allergies  Allergen Reactions  . Marinol [Dronabinol]     Delusions.    Current Outpatient Prescriptions on File Prior to Visit  Medication Sig Dispense Refill  . amLODipine (NORVASC) 10 MG tablet Take 1 tablet (10 mg total) by mouth daily. 90 tablet 3  . amLODipine  (NORVASC) 10 MG tablet TAKE 1 TABLET BY MOUTH EVERY DAY 90 tablet 3  . EPIPEN 2-PAK 0.3 MG/0.3ML DEVI Inject 0.3 mg into the muscle as needed.     . fluticasone (FLONASE) 50 MCG/ACT nasal spray Place 1 spray into the nose.    Marland Kitchen HYDROcodone-homatropine (HYCODAN) 5-1.5 MG/5ML syrup Take 5 mLs by mouth every 6 (six) hours as needed for cough. 120 mL 0  . Nutritional Supplements (JUICE PLUS FIBRE PO) Take by mouth. 2 tabs twice daily    . omeprazole (PRILOSEC OTC) 20 MG tablet Take 20 mg by mouth daily.      . pravastatin (PRAVACHOL) 20 MG tablet Take 1 tablet (20 mg total) by mouth daily. 90 tablet 3  . Saw Palmetto, Serenoa repens, (SAW PALMETTO PO) Take by mouth.    . sertraline (ZOLOFT) 50 MG tablet TAKE 1 TABLET (50 MG TOTAL) BY MOUTH DAILY. 90 tablet 0  . sildenafil (VIAGRA) 100 MG tablet Take 0.5 tablets (50 mg total) by mouth as needed for erectile dysfunction. 10 tablet 6  . sildenafil (VIAGRA) 100 MG tablet Take 1 tablet (  100 mg total) by mouth daily as needed for erectile dysfunction. 60 tablet 3  . UNABLE TO FIND 1 tablet 3 (three) times daily. Med Name: Turmeric & Bromelain 1300mg -150mg     . valACYclovir (VALTREX) 1000 MG tablet TAKE 1 TABLET BY MOUTH EVERY DAY 30 tablet 2   No current facility-administered medications on file prior to visit.     BP 128/88 (BP Location: Left Arm, Patient Position: Sitting, Cuff Size: Normal)   Pulse 72   Temp 97.9 F (36.6 C) (Oral)   Wt 176 lb 9.6 oz (80.1 kg)   SpO2 98%   BMI 25.34 kg/m     Review of Systems  Constitutional: Negative for appetite change, chills, fatigue and fever.  HENT: Negative for congestion, dental problem, ear pain, hearing loss, sore throat, tinnitus, trouble swallowing and voice change.   Eyes: Negative for pain, discharge and visual disturbance.  Respiratory: Negative for cough, chest tightness, wheezing and stridor.   Cardiovascular: Negative for chest pain, palpitations and leg swelling.  Gastrointestinal:  Negative for abdominal distention, abdominal pain, blood in stool, constipation, diarrhea, nausea and vomiting.  Genitourinary: Negative for difficulty urinating, discharge, flank pain, genital sores, hematuria and urgency.  Musculoskeletal: Negative for arthralgias, back pain, gait problem, joint swelling, myalgias and neck stiffness.  Skin: Negative for rash.       Swelling and pruritus involving the right lower arm  Neurological: Negative for dizziness, syncope, speech difficulty, weakness, numbness and headaches.  Hematological: Negative for adenopathy. Does not bruise/bleed easily.  Psychiatric/Behavioral: Negative for behavioral problems and dysphoric mood. The patient is not nervous/anxious.        Objective:   Physical Exam  Constitutional: He appears well-developed and well-nourished. No distress.  Skin:  Mild erythema and soft tissue swelling involving the outer aspect of the right lower arm just distal to the elbow area.  No obvious puncture wound..  No excessive warmth or local tenderness          Assessment & Plan:   Allergic reaction.  Will treat with ice and elevation.  Patient was given Depo-Medrol 80.  Allergy follow-up.  If unimproved  Will consider a nonsedating antihistamine  [name]

## 2016-12-09 NOTE — Patient Instructions (Addendum)
WE NOW OFFER   [facility] FAST TRACK!!!  SAME DAY Appointments for ACUTE CARE  Such as: Sprains, Injuries, cuts, abrasions, rashes, muscle pain, joint pain, back pain Colds, flu, sore throats, headache, allergies, cough, fever  Ear pain, sinus and eye infections Abdominal pain, nausea, vomiting, diarrhea, upset stomach Animal/insect bites  3 Easy Ways to Schedule: Walk-In Scheduling Call in scheduling Mychart Sign-up: [email or web address]     Consider taking Benadryl or a nonsedating antihistamine such as Allegra or Claritin once daily  Keep the right arm elevated and apply cold compresses this evening  Call or return to clinic prn if these symptoms worsen or fail to improve as anticipated.

## 2016-12-12 DIAGNOSIS — J301 Allergic rhinitis due to pollen: Secondary | ICD-10-CM | POA: Diagnosis not present

## 2016-12-23 DIAGNOSIS — M25522 Pain in left elbow: Secondary | ICD-10-CM | POA: Diagnosis not present

## 2016-12-23 DIAGNOSIS — M25552 Pain in left hip: Secondary | ICD-10-CM | POA: Diagnosis not present

## 2016-12-26 DIAGNOSIS — M25552 Pain in left hip: Secondary | ICD-10-CM | POA: Diagnosis not present

## 2016-12-26 DIAGNOSIS — M545 Low back pain: Secondary | ICD-10-CM | POA: Diagnosis not present

## 2016-12-26 DIAGNOSIS — M25522 Pain in left elbow: Secondary | ICD-10-CM | POA: Diagnosis not present

## 2017-01-08 DIAGNOSIS — L570 Actinic keratosis: Secondary | ICD-10-CM | POA: Diagnosis not present

## 2017-01-08 DIAGNOSIS — D485 Neoplasm of uncertain behavior of skin: Secondary | ICD-10-CM | POA: Diagnosis not present

## 2017-01-08 DIAGNOSIS — Z85828 Personal history of other malignant neoplasm of skin: Secondary | ICD-10-CM | POA: Diagnosis not present

## 2017-01-08 DIAGNOSIS — D225 Melanocytic nevi of trunk: Secondary | ICD-10-CM | POA: Diagnosis not present

## 2017-01-10 ENCOUNTER — Other Ambulatory Visit: Payer: Self-pay | Admitting: Internal Medicine

## 2017-01-16 DIAGNOSIS — J301 Allergic rhinitis due to pollen: Secondary | ICD-10-CM | POA: Diagnosis not present

## 2017-01-20 DIAGNOSIS — Z9484 Stem cells transplant status: Secondary | ICD-10-CM | POA: Diagnosis not present

## 2017-01-20 DIAGNOSIS — K219 Gastro-esophageal reflux disease without esophagitis: Secondary | ICD-10-CM | POA: Diagnosis not present

## 2017-01-20 DIAGNOSIS — Z888 Allergy status to other drugs, medicaments and biological substances status: Secondary | ICD-10-CM | POA: Diagnosis not present

## 2017-01-20 DIAGNOSIS — I10 Essential (primary) hypertension: Secondary | ICD-10-CM | POA: Diagnosis not present

## 2017-01-20 DIAGNOSIS — Z885 Allergy status to narcotic agent status: Secondary | ICD-10-CM | POA: Diagnosis not present

## 2017-01-20 DIAGNOSIS — C9200 Acute myeloblastic leukemia, not having achieved remission: Secondary | ICD-10-CM | POA: Diagnosis not present

## 2017-01-20 DIAGNOSIS — C9201 Acute myeloblastic leukemia, in remission: Secondary | ICD-10-CM | POA: Diagnosis not present

## 2017-01-20 DIAGNOSIS — D649 Anemia, unspecified: Secondary | ICD-10-CM | POA: Diagnosis not present

## 2017-01-20 DIAGNOSIS — K449 Diaphragmatic hernia without obstruction or gangrene: Secondary | ICD-10-CM | POA: Diagnosis not present

## 2017-01-20 DIAGNOSIS — Z85828 Personal history of other malignant neoplasm of skin: Secondary | ICD-10-CM | POA: Diagnosis not present

## 2017-01-20 DIAGNOSIS — Z9889 Other specified postprocedural states: Secondary | ICD-10-CM | POA: Diagnosis not present

## 2017-01-20 DIAGNOSIS — Z79899 Other long term (current) drug therapy: Secondary | ICD-10-CM | POA: Diagnosis not present

## 2017-02-09 DIAGNOSIS — J301 Allergic rhinitis due to pollen: Secondary | ICD-10-CM | POA: Diagnosis not present

## 2017-02-13 DIAGNOSIS — J301 Allergic rhinitis due to pollen: Secondary | ICD-10-CM | POA: Diagnosis not present

## 2017-02-16 DIAGNOSIS — M545 Low back pain: Secondary | ICD-10-CM | POA: Diagnosis not present

## 2017-02-16 DIAGNOSIS — M25552 Pain in left hip: Secondary | ICD-10-CM | POA: Diagnosis not present

## 2017-02-16 DIAGNOSIS — M25522 Pain in left elbow: Secondary | ICD-10-CM | POA: Diagnosis not present

## 2017-02-18 ENCOUNTER — Other Ambulatory Visit: Payer: Self-pay | Admitting: Internal Medicine

## 2017-02-18 DIAGNOSIS — H25819 Combined forms of age-related cataract, unspecified eye: Secondary | ICD-10-CM | POA: Diagnosis not present

## 2017-02-26 DIAGNOSIS — M25552 Pain in left hip: Secondary | ICD-10-CM | POA: Diagnosis not present

## 2017-02-26 DIAGNOSIS — M545 Low back pain: Secondary | ICD-10-CM | POA: Diagnosis not present

## 2017-02-26 DIAGNOSIS — M25522 Pain in left elbow: Secondary | ICD-10-CM | POA: Diagnosis not present

## 2017-03-03 DIAGNOSIS — J301 Allergic rhinitis due to pollen: Secondary | ICD-10-CM | POA: Diagnosis not present

## 2017-03-10 DIAGNOSIS — M25552 Pain in left hip: Secondary | ICD-10-CM | POA: Diagnosis not present

## 2017-03-10 DIAGNOSIS — M25522 Pain in left elbow: Secondary | ICD-10-CM | POA: Diagnosis not present

## 2017-03-10 DIAGNOSIS — M545 Low back pain: Secondary | ICD-10-CM | POA: Diagnosis not present

## 2017-03-19 DIAGNOSIS — J301 Allergic rhinitis due to pollen: Secondary | ICD-10-CM | POA: Diagnosis not present

## 2017-03-23 ENCOUNTER — Ambulatory Visit (INDEPENDENT_AMBULATORY_CARE_PROVIDER_SITE_OTHER): Payer: BLUE CROSS/BLUE SHIELD | Admitting: Internal Medicine

## 2017-03-23 ENCOUNTER — Encounter: Payer: Self-pay | Admitting: Internal Medicine

## 2017-03-23 VITALS — BP 132/86 | HR 69 | Temp 97.6°F | Ht 70.0 in | Wt 178.0 lb

## 2017-03-23 DIAGNOSIS — J301 Allergic rhinitis due to pollen: Secondary | ICD-10-CM | POA: Diagnosis not present

## 2017-03-23 DIAGNOSIS — I10 Essential (primary) hypertension: Secondary | ICD-10-CM | POA: Diagnosis not present

## 2017-03-23 DIAGNOSIS — L03116 Cellulitis of left lower limb: Secondary | ICD-10-CM

## 2017-03-23 MED ORDER — CEPHALEXIN 500 MG PO CAPS: 500.0000 mg | ORAL_CAPSULE | Freq: Four times a day (QID) | ORAL | 0 refills | Status: DC

## 2017-03-23 NOTE — Progress Notes (Signed)
Subjective:    Patient ID: [name], male    DOB: 09-07-1955, 61 y.o.   MRN: [id number]  HPI 61 year old patient who enjoys excellent health with a history of essential hypertension.  He states that recently he sustained a bite of some sort to the left lower leg near the ankle.  Over the past few days.  This area has become red, swollen and painful to touch.  Denies any fever or other systemic complaints.  Past Medical History:  Diagnosis Date  . Acute myeloid leukemia in remission ([place]) 1999  . Anemia   . Arthritis   . Depression   . Diverticulosis of colon (without mention of hemorrhage)   . Esophageal reflux   . Family hx colonic polyps   . Hiatal hernia   . History of kidney stones   . Hyperlipemia   . Hypertension   . Psychosexual dysfunction with inhibited sexual excitement      Social History   Social History  . Marital status: Married    Spouse name: N/A  . Number of children: 2  . Years of education: N/A   Occupational History  . self employed [employer] History Main Topics  . Smoking status: Never Smoker  . Smokeless tobacco: Never Used  . Alcohol use Yes     Comment: one occ   . Drug use: No  . Sexual activity: No   Other Topics Concern  . Not on file   Social History Narrative   2 caffeine drinks daily     Past Surgical History:  Procedure Laterality Date  . SHOULDER SURGERY     x 3     Family History  Problem Relation Age of Onset  . Hypertension Father   . Heart disease Father   . Prostate cancer Father        dx late 42s  . Cancer Father 75       prostate ca  . Celiac disease Mother   . Colon polyps Mother   . Breast cancer Mother   . Cancer Mother        Bladder  . Colon cancer Neg Hx     Allergies  Allergen Reactions  . Marinol [Dronabinol]     Delusions.    Current Outpatient Prescriptions on File Prior to Visit  Medication Sig Dispense Refill  . amLODipine (NORVASC) 10 MG tablet Take 1 tablet (10  mg total) by mouth daily. 90 tablet 3  . amLODipine (NORVASC) 10 MG tablet TAKE 1 TABLET BY MOUTH EVERY DAY 90 tablet 3  . EPIPEN 2-PAK 0.3 MG/0.3ML DEVI Inject 0.3 mg into the muscle as needed.     . fluticasone (FLONASE) 50 MCG/ACT nasal spray Place 1 spray into the nose.    Marland Kitchen HYDROcodone-homatropine (HYCODAN) 5-1.5 MG/5ML syrup Take 5 mLs by mouth every 6 (six) hours as needed for cough. 120 mL 0  . Nutritional Supplements (JUICE PLUS FIBRE PO) Take by mouth. 2 tabs twice daily    . omeprazole (PRILOSEC OTC) 20 MG tablet Take 20 mg by mouth daily.      . pravastatin (PRAVACHOL) 20 MG tablet Take 1 tablet (20 mg total) by mouth daily. 90 tablet 3  . Saw Palmetto, Serenoa repens, (SAW PALMETTO PO) Take by mouth.    . sertraline (ZOLOFT) 50 MG tablet TAKE 1 TABLET (50 MG TOTAL) BY MOUTH DAILY. 90 tablet 0  . sildenafil (VIAGRA) 100 MG tablet Take 0.5 tablets (50 mg total)  by mouth as needed for erectile dysfunction. 10 tablet 6  . UNABLE TO FIND 1 tablet 3 (three) times daily. Med Name: Turmeric & Bromelain 1300mg -150mg     . valACYclovir (VALTREX) 1000 MG tablet TAKE 1 TABLET BY MOUTH EVERY DAY 30 tablet 2   No current facility-administered medications on file prior to visit.     BP 132/86 (BP Location: Left Arm, Patient Position: Sitting, Cuff Size: Normal)   Pulse 69   Temp 97.6 F (36.4 C) (Oral)   Ht 5\' 10"  (1.778 m)   Wt 178 lb (80.7 kg)   SpO2 98%   BMI 25.54 kg/m     Review of Systems  Constitutional: Negative for appetite change, chills, fatigue and fever.  HENT: Negative for congestion, dental problem, ear pain, hearing loss, sore throat, tinnitus, trouble swallowing and voice change.   Eyes: Negative for pain, discharge and visual disturbance.  Respiratory: Negative for cough, chest tightness, wheezing and stridor.   Cardiovascular: Negative for chest pain, palpitations and leg swelling.  Gastrointestinal: Negative for abdominal distention, abdominal pain, blood in stool,  constipation, diarrhea, nausea and vomiting.  Genitourinary: Negative for difficulty urinating, discharge, flank pain, genital sores, hematuria and urgency.  Musculoskeletal: Negative for arthralgias, back pain, gait problem, joint swelling, myalgias and neck stiffness.  Skin: Positive for wound. Negative for rash.  Neurological: Negative for dizziness, syncope, speech difficulty, weakness, numbness and headaches.  Hematological: Negative for adenopathy. Does not bruise/bleed easily.  Psychiatric/Behavioral: Negative for behavioral problems and dysphoric mood. The patient is not nervous/anxious.        Objective:   Physical Exam  Constitutional: He appears well-developed and well-nourished. No distress.  Blood pressure 130/82  Skin:  A crusted lesion present involving the left lower leg near the ankle posteriorly.  There was some surrounding erythema, soft tissue swelling and local tenderness          Assessment & Plan:   Early cellulitis, left lower leg.  Patient will be treated with elevation cephalexin. The patient report any clinical worsening, such as pain, swelling, redness, fever or other constitutional complaints  [name]

## 2017-03-23 NOTE — Patient Instructions (Signed)

## 2017-03-31 ENCOUNTER — Encounter: Payer: Self-pay | Admitting: Internal Medicine

## 2017-03-31 ENCOUNTER — Ambulatory Visit (INDEPENDENT_AMBULATORY_CARE_PROVIDER_SITE_OTHER): Payer: BLUE CROSS/BLUE SHIELD | Admitting: Internal Medicine

## 2017-03-31 VITALS — BP 126/86 | HR 67 | Temp 97.7°F | Ht 70.0 in | Wt 176.4 lb

## 2017-03-31 DIAGNOSIS — M549 Dorsalgia, unspecified: Secondary | ICD-10-CM | POA: Diagnosis not present

## 2017-03-31 DIAGNOSIS — I10 Essential (primary) hypertension: Secondary | ICD-10-CM | POA: Diagnosis not present

## 2017-03-31 DIAGNOSIS — J301 Allergic rhinitis due to pollen: Secondary | ICD-10-CM | POA: Diagnosis not present

## 2017-03-31 DIAGNOSIS — G8929 Other chronic pain: Secondary | ICD-10-CM

## 2017-03-31 NOTE — Progress Notes (Signed)
Subjective:    Patient ID: [name], male    DOB: May 08, 1956, 61 y.o.   MRN: [id number]  HPI  61 year old patient who presents with a chief complaint of low back pain.  This began last week after some heavy lifting.  He has a long history of chronic low back pain and has received intermittent physical therapy over the past 10 years.  This pain is described as somewhat different, however.  He is on a regimen of low back exercises and stretching.  No radicular symptoms.  No systemic symptoms.  He has a slight concern about a prostate infection since he has had this in the remote past.  At that time.  However, he had systemic and flulike symptoms as well.  Past Medical History:  Diagnosis Date  . Acute myeloid leukemia in remission ([place]) 1999  . Anemia   . Arthritis   . Depression   . Diverticulosis of colon (without mention of hemorrhage)   . Esophageal reflux   . Family hx colonic polyps   . Hiatal hernia   . History of kidney stones   . Hyperlipemia   . Hypertension   . Psychosexual dysfunction with inhibited sexual excitement      Social History   Social History  . Marital status: Married    Spouse name: N/A  . Number of children: 2  . Years of education: N/A   Occupational History  . self employed [employer] History Main Topics  . Smoking status: Never Smoker  . Smokeless tobacco: Never Used  . Alcohol use Yes     Comment: one occ   . Drug use: No  . Sexual activity: No   Other Topics Concern  . Not on file   Social History Narrative   2 caffeine drinks daily     Past Surgical History:  Procedure Laterality Date  . SHOULDER SURGERY     x 3     Family History  Problem Relation Age of Onset  . Hypertension Father   . Heart disease Father   . Prostate cancer Father        dx late 63s  . Cancer Father 58       prostate ca  . Celiac disease Mother   . Colon polyps Mother   . Breast cancer Mother   . Cancer Mother        Bladder    . Colon cancer Neg Hx     Allergies  Allergen Reactions  . Marinol [Dronabinol]     Delusions.    Current Outpatient Prescriptions on File Prior to Visit  Medication Sig Dispense Refill  . amLODipine (NORVASC) 10 MG tablet Take 1 tablet (10 mg total) by mouth daily. 90 tablet 3  . EPIPEN 2-PAK 0.3 MG/0.3ML DEVI Inject 0.3 mg into the muscle as needed.     . fluticasone (FLONASE) 50 MCG/ACT nasal spray Place 1 spray into the nose.    Marland Kitchen HYDROcodone-homatropine (HYCODAN) 5-1.5 MG/5ML syrup Take 5 mLs by mouth every 6 (six) hours as needed for cough. 120 mL 0  . Nutritional Supplements (JUICE PLUS FIBRE PO) Take by mouth. 2 tabs twice daily    . omeprazole (PRILOSEC OTC) 20 MG tablet Take 20 mg by mouth daily.      . pravastatin (PRAVACHOL) 20 MG tablet Take 1 tablet (20 mg total) by mouth daily. 90 tablet 3  . Saw Palmetto, Serenoa repens, (SAW PALMETTO PO) Take by mouth.    Marland Kitchen  sertraline (ZOLOFT) 50 MG tablet TAKE 1 TABLET (50 MG TOTAL) BY MOUTH DAILY. 90 tablet 0  . sildenafil (VIAGRA) 100 MG tablet Take 0.5 tablets (50 mg total) by mouth as needed for erectile dysfunction. 10 tablet 6  . UNABLE TO FIND 1 tablet 3 (three) times daily. Med Name: Turmeric & Bromelain 1300mg -150mg     . valACYclovir (VALTREX) 1000 MG tablet TAKE 1 TABLET BY MOUTH EVERY DAY 30 tablet 2   No current facility-administered medications on file prior to visit.     BP 126/86 (BP Location: Left Arm, Patient Position: Sitting, Cuff Size: Normal)   Pulse 67   Temp 97.7 F (36.5 C) (Oral)   Ht 5\' 10"  (1.778 m)   Wt 176 lb 6.4 oz (80 kg)   SpO2 98%   BMI 25.31 kg/m     Review of Systems  Constitutional: Negative for appetite change, chills, fatigue and fever.  HENT: Negative for congestion, dental problem, ear pain, hearing loss, sore throat, tinnitus, trouble swallowing and voice change.   Eyes: Negative for pain, discharge and visual disturbance.  Respiratory: Negative for cough, chest tightness,  wheezing and stridor.   Cardiovascular: Negative for chest pain, palpitations and leg swelling.  Gastrointestinal: Negative for abdominal distention, abdominal pain, blood in stool, constipation, diarrhea, nausea and vomiting.  Genitourinary: Negative for difficulty urinating, discharge, flank pain, genital sores, hematuria and urgency.  Musculoskeletal: Positive for back pain. Negative for arthralgias, gait problem, joint swelling, myalgias and neck stiffness.  Skin: Negative for rash.  Neurological: Negative for dizziness, syncope, speech difficulty, weakness, numbness and headaches.  Hematological: Negative for adenopathy. Does not bruise/bleed easily.  Psychiatric/Behavioral: Negative for behavioral problems and dysphoric mood. The patient is not nervous/anxious.        Objective:   Physical Exam  Constitutional: He appears well-developed and well-nourished. No distress.  Musculoskeletal:  Negative straight leg test Full range of motion both hips Motor exam normal Reflexes slightly blunted but symmetrical  Skin:  Cellulitis, left foot has resolved          Assessment & Plan:   Low back pain.  Suspect musculoligamentous strain.  Will try Aleve twice daily for a period time.  Will continue gentle stretching and range of motion Will call if unimproved Status post cellulitis, left ankle, resolved  [name]

## 2017-03-31 NOTE — Patient Instructions (Signed)
You  may move around, but avoid painful motions and activities.    Call or return to clinic prn if these symptoms worsen or fail to improve as anticipated.   

## 2017-04-02 DIAGNOSIS — M25552 Pain in left hip: Secondary | ICD-10-CM | POA: Diagnosis not present

## 2017-04-02 DIAGNOSIS — M545 Low back pain: Secondary | ICD-10-CM | POA: Diagnosis not present

## 2017-04-02 DIAGNOSIS — M25522 Pain in left elbow: Secondary | ICD-10-CM | POA: Diagnosis not present

## 2017-04-15 DIAGNOSIS — J301 Allergic rhinitis due to pollen: Secondary | ICD-10-CM | POA: Diagnosis not present

## 2017-04-23 DIAGNOSIS — M545 Low back pain: Secondary | ICD-10-CM | POA: Diagnosis not present

## 2017-04-23 DIAGNOSIS — M25552 Pain in left hip: Secondary | ICD-10-CM | POA: Diagnosis not present

## 2017-04-23 DIAGNOSIS — M25522 Pain in left elbow: Secondary | ICD-10-CM | POA: Diagnosis not present

## 2017-04-27 DIAGNOSIS — J301 Allergic rhinitis due to pollen: Secondary | ICD-10-CM | POA: Diagnosis not present

## 2017-04-30 ENCOUNTER — Encounter: Payer: Self-pay | Admitting: Internal Medicine

## 2017-05-04 DIAGNOSIS — J301 Allergic rhinitis due to pollen: Secondary | ICD-10-CM | POA: Diagnosis not present

## 2017-05-06 DIAGNOSIS — J301 Allergic rhinitis due to pollen: Secondary | ICD-10-CM | POA: Diagnosis not present

## 2017-05-17 ENCOUNTER — Other Ambulatory Visit: Payer: Self-pay | Admitting: Internal Medicine

## 2017-05-18 DIAGNOSIS — M25552 Pain in left hip: Secondary | ICD-10-CM | POA: Diagnosis not present

## 2017-05-18 DIAGNOSIS — M545 Low back pain: Secondary | ICD-10-CM | POA: Diagnosis not present

## 2017-05-18 DIAGNOSIS — M25522 Pain in left elbow: Secondary | ICD-10-CM | POA: Diagnosis not present

## 2017-05-27 DIAGNOSIS — J301 Allergic rhinitis due to pollen: Secondary | ICD-10-CM | POA: Diagnosis not present

## 2017-06-02 DIAGNOSIS — G43B0 Ophthalmoplegic migraine, not intractable: Secondary | ICD-10-CM | POA: Diagnosis not present

## 2017-06-16 ENCOUNTER — Telehealth: Payer: Self-pay | Admitting: Internal Medicine

## 2017-06-16 MED ORDER — SERTRALINE HCL 50 MG PO TABS
50.0000 mg | ORAL_TABLET | Freq: Every day | ORAL | 0 refills | Status: DC
Start: 2017-06-16 — End: 2018-04-22

## 2017-06-16 NOTE — Telephone Encounter (Signed)
° ° ° °  Pt is in [place] and left the below med at home and is asking if about 3 pills can be called in. Pt would like a call back     sertraline (ZOLOFT) 50 MG tablet    [facility] [address] [address]

## 2017-06-16 NOTE — Telephone Encounter (Signed)
Med was refilled

## 2017-06-21 DIAGNOSIS — J0140 Acute pansinusitis, unspecified: Secondary | ICD-10-CM | POA: Diagnosis not present

## 2017-06-23 DIAGNOSIS — M25522 Pain in left elbow: Secondary | ICD-10-CM | POA: Diagnosis not present

## 2017-06-23 DIAGNOSIS — M7712 Lateral epicondylitis, left elbow: Secondary | ICD-10-CM | POA: Diagnosis not present

## 2017-07-07 DIAGNOSIS — K219 Gastro-esophageal reflux disease without esophagitis: Secondary | ICD-10-CM | POA: Diagnosis not present

## 2017-07-07 DIAGNOSIS — J3089 Other allergic rhinitis: Secondary | ICD-10-CM | POA: Diagnosis not present

## 2017-07-07 DIAGNOSIS — J301 Allergic rhinitis due to pollen: Secondary | ICD-10-CM | POA: Diagnosis not present

## 2017-08-14 ENCOUNTER — Other Ambulatory Visit: Payer: Self-pay | Admitting: Internal Medicine

## 2017-08-23 ENCOUNTER — Other Ambulatory Visit: Payer: Self-pay | Admitting: Internal Medicine

## 2017-08-24 DIAGNOSIS — J301 Allergic rhinitis due to pollen: Secondary | ICD-10-CM | POA: Diagnosis not present

## 2017-08-25 DIAGNOSIS — M25522 Pain in left elbow: Secondary | ICD-10-CM | POA: Diagnosis not present

## 2017-08-25 DIAGNOSIS — M545 Low back pain: Secondary | ICD-10-CM | POA: Diagnosis not present

## 2017-08-25 DIAGNOSIS — M25552 Pain in left hip: Secondary | ICD-10-CM | POA: Diagnosis not present

## 2017-09-09 DIAGNOSIS — J301 Allergic rhinitis due to pollen: Secondary | ICD-10-CM | POA: Diagnosis not present

## 2017-09-11 DIAGNOSIS — J301 Allergic rhinitis due to pollen: Secondary | ICD-10-CM | POA: Diagnosis not present

## 2017-09-15 DIAGNOSIS — M25522 Pain in left elbow: Secondary | ICD-10-CM | POA: Diagnosis not present

## 2017-09-15 DIAGNOSIS — M25552 Pain in left hip: Secondary | ICD-10-CM | POA: Diagnosis not present

## 2017-09-15 DIAGNOSIS — M545 Low back pain: Secondary | ICD-10-CM | POA: Diagnosis not present

## 2017-09-16 DIAGNOSIS — J301 Allergic rhinitis due to pollen: Secondary | ICD-10-CM | POA: Diagnosis not present

## 2017-09-23 DIAGNOSIS — J301 Allergic rhinitis due to pollen: Secondary | ICD-10-CM | POA: Diagnosis not present

## 2017-09-30 DIAGNOSIS — M545 Low back pain: Secondary | ICD-10-CM | POA: Diagnosis not present

## 2017-09-30 DIAGNOSIS — M25552 Pain in left hip: Secondary | ICD-10-CM | POA: Diagnosis not present

## 2017-09-30 DIAGNOSIS — M25522 Pain in left elbow: Secondary | ICD-10-CM | POA: Diagnosis not present

## 2017-10-07 DIAGNOSIS — J301 Allergic rhinitis due to pollen: Secondary | ICD-10-CM | POA: Diagnosis not present

## 2017-10-14 DIAGNOSIS — J301 Allergic rhinitis due to pollen: Secondary | ICD-10-CM | POA: Diagnosis not present

## 2017-10-15 DIAGNOSIS — M7051 Other bursitis of knee, right knee: Secondary | ICD-10-CM | POA: Diagnosis not present

## 2017-10-17 ENCOUNTER — Encounter: Payer: Self-pay | Admitting: Internal Medicine

## 2017-10-20 DIAGNOSIS — M7051 Other bursitis of knee, right knee: Secondary | ICD-10-CM | POA: Diagnosis not present

## 2017-10-21 ENCOUNTER — Encounter: Payer: Self-pay | Admitting: Internal Medicine

## 2017-10-21 ENCOUNTER — Ambulatory Visit (INDEPENDENT_AMBULATORY_CARE_PROVIDER_SITE_OTHER): Payer: BLUE CROSS/BLUE SHIELD | Admitting: Internal Medicine

## 2017-10-21 VITALS — BP 122/68 | HR 70 | Temp 98.0°F | Ht 68.0 in | Wt 178.0 lb

## 2017-10-21 DIAGNOSIS — I10 Essential (primary) hypertension: Secondary | ICD-10-CM | POA: Insufficient documentation

## 2017-10-21 DIAGNOSIS — Z0000 Encounter for general adult medical examination without abnormal findings: Secondary | ICD-10-CM | POA: Diagnosis not present

## 2017-10-21 DIAGNOSIS — R7301 Impaired fasting glucose: Secondary | ICD-10-CM

## 2017-10-21 DIAGNOSIS — R7309 Other abnormal glucose: Secondary | ICD-10-CM | POA: Diagnosis not present

## 2017-10-21 LAB — CBC WITH DIFFERENTIAL/PLATELET
BASOS ABS: 0 10*3/uL (ref 0.0–0.1)
Basophils Relative: 0.7 % (ref 0.0–3.0)
EOS ABS: 0.2 10*3/uL (ref 0.0–0.7)
Eosinophils Relative: 4.2 % (ref 0.0–5.0)
HCT: 43.6 % (ref 39.0–52.0)
Hemoglobin: 14.7 g/dL (ref 13.0–17.0)
LYMPHS ABS: 1 10*3/uL (ref 0.7–4.0)
Lymphocytes Relative: 25 % (ref 12.0–46.0)
MCHC: 33.7 g/dL (ref 30.0–36.0)
MCV: 95.9 fl (ref 78.0–100.0)
MONO ABS: 0.5 10*3/uL (ref 0.1–1.0)
MONOS PCT: 11.8 % (ref 3.0–12.0)
NEUTROS ABS: 2.4 10*3/uL (ref 1.4–7.7)
Neutrophils Relative %: 58.3 % (ref 43.0–77.0)
Platelets: 336 10*3/uL (ref 150.0–400.0)
RBC: 4.55 Mil/uL (ref 4.22–5.81)
RDW: 12.9 % (ref 11.5–15.5)
WBC: 4.2 10*3/uL (ref 4.0–10.5)

## 2017-10-21 LAB — COMPREHENSIVE METABOLIC PANEL
ALBUMIN: 4.8 g/dL (ref 3.5–5.2)
ALK PHOS: 41 U/L (ref 39–117)
ALT: 37 U/L (ref 0–53)
AST: 23 U/L (ref 0–37)
BILIRUBIN TOTAL: 0.5 mg/dL (ref 0.2–1.2)
BUN: 20 mg/dL (ref 6–23)
CALCIUM: 10 mg/dL (ref 8.4–10.5)
CO2: 32 meq/L (ref 19–32)
CREATININE: 1.07 mg/dL (ref 0.40–1.50)
Chloride: 102 mEq/L (ref 96–112)
GFR: 74.38 mL/min (ref >60.00)
Glucose, Bld: 105 mg/dL — ABNORMAL HIGH (ref 70–99)
Potassium: 4.4 mEq/L (ref 3.5–5.1)
Sodium: 141 mEq/L (ref 135–145)
TOTAL PROTEIN: 6.9 g/dL (ref 6.0–8.3)

## 2017-10-21 LAB — LIPID PANEL
CHOLESTEROL: 181 mg/dL (ref 0–200)
HDL: 64.5 mg/dL (ref >39.00)
LDL Cholesterol: 98 mg/dL (ref 0–99)
NonHDL: 116.73
TRIGLYCERIDES: 95 mg/dL (ref 0.0–149.0)
Total CHOL/HDL Ratio: 3
VLDL: 19 mg/dL (ref 0.0–40.0)

## 2017-10-21 LAB — HEMOGLOBIN A1C: Hgb A1c MFr Bld: 6 % (ref 4.6–6.5)

## 2017-10-21 LAB — PSA: PSA: 1.01 ng/mL (ref 0.10–4.00)

## 2017-10-21 LAB — TSH: TSH: 3 u[IU]/mL (ref 0.35–4.50)

## 2017-10-21 MED ORDER — SILDENAFIL CITRATE 100 MG PO TABS: 50.0000 mg | ORAL_TABLET | ORAL | 6 refills | Status: DC | PRN

## 2017-10-21 NOTE — Patient Instructions (Signed)
Limit your sodium (Salt) intake  Please check your blood pressure on a regular basis.  If it is consistently greater than 140/90, please make an office appointment.    It is important that you exercise regularly, at least 20 minutes 3 to 4 times per week.  If you develop chest pain or shortness of breath seek  medical attention.

## 2017-10-21 NOTE — Progress Notes (Signed)
Subjective:    Patient ID: [name], male    DOB: Sep 24, 1955, 62 y.o.   MRN: [id number]  HPI 62 year old patient who is seen today for a preventive health examination.  He continues to do well. He remains on sertraline for anxiety disorder.  Has a history of essential hypertension and dyslipidemia.  He is followed annually at [facility] with remote history of AML;  no new current concerns or complaints except for some right knee discomfort for the past few weeks.   Past Medical History:  Diagnosis Date  . Acute myeloid leukemia in remission ([place])  . Basal cell carcinoma 01/2015  right temple; s/p removal  . Depressive disorder, not elsewhere classified  . Helicobacter pylori gastritis  . Hiatal hernia with gastroesophageal reflux  . Nephrolithiasis  . Prostatitis, chronic  . Squamous cell carcinoma of skin of face  . Unspecified essential hypertension    Past Medical History:  Diagnosis Date  . Acute myeloid leukemia in remission ([place]) 1999  . Anemia   . Arthritis   . Depression   . Diverticulosis of colon (without mention of hemorrhage)   . Esophageal reflux   . Family hx colonic polyps   . Hiatal hernia   . History of kidney stones   . Hyperlipemia   . Hypertension   . Psychosexual dysfunction with inhibited sexual excitement      Social History   Socioeconomic History  . Marital status: Married    Spouse name: Not on file  . Number of children: 2  . Years of education: Not on file  . Highest education level: Not on file  Social Needs  . Financial resource strain: Not on file  . Food insecurity - worry: Not on file  . Food insecurity - inability: Not on file  . Transportation needs - medical: Not on file  . Transportation needs - non-medical: Not on file  Occupational History  . Occupation: self employed    Employer: [employer]  Tobacco Use  . Smoking status: Never Smoker  . Smokeless tobacco: Never Used  Substance and Sexual Activity  .  Alcohol use: Yes    Comment: one occ   . Drug use: No  . Sexual activity: No  Other Topics Concern  . Not on file  Social History Narrative   2 caffeine drinks daily     Past Surgical History:  Procedure Laterality Date  . SHOULDER SURGERY     x 3     Family History  Problem Relation Age of Onset  . Hypertension Father   . Heart disease Father   . Prostate cancer Father        dx late 34s  . Cancer Father 70       prostate ca  . Celiac disease Mother   . Colon polyps Mother   . Breast cancer Mother   . Cancer Mother        Bladder  . Colon cancer Neg Hx     Allergies  Allergen Reactions  . Morphine Other (See Comments)    UNKNOWN - REACTION IS NOT LISTED  . Marinol [Dronabinol]     Delusions.    Current Outpatient Medications on File Prior to Visit  Medication Sig Dispense Refill  . amLODipine (NORVASC) 10 MG tablet Take 1 tablet (10 mg total) by mouth daily. 90 tablet 3  . Beclomethasone Dipropionate (QNASL) 80 MCG/ACT AERS as needed.    Marland Kitchen EPIPEN 2-PAK 0.3 MG/0.3ML DEVI Inject 0.3  mg into the muscle as needed.     . fluticasone (FLONASE) 50 MCG/ACT nasal spray Place 1 spray into the nose.    . Nutritional Supplements (JUICE PLUS FIBRE PO) Take by mouth. 2 tabs twice daily    . omeprazole (PRILOSEC OTC) 20 MG tablet Take 20 mg by mouth daily.      . pravastatin (PRAVACHOL) 20 MG tablet Take 1 tablet (20 mg total) by mouth daily. 90 tablet 3  . Saw Palmetto, Serenoa repens, (SAW PALMETTO PO) Take by mouth.    . sertraline (ZOLOFT) 50 MG tablet Take 1 tablet (50 mg total) daily by mouth. 3 tablet 0  . sildenafil (VIAGRA) 100 MG tablet Take 0.5 tablets (50 mg total) by mouth as needed for erectile dysfunction. 10 tablet 6  . UNABLE TO FIND 1 tablet 3 (three) times daily. Med Name: Turmeric & Bromelain 1300mg -150mg      No current facility-administered medications on file prior to visit.     BP 122/68 (BP Location: Left Arm, Patient Position: Sitting, Cuff Size:  Large)   Pulse 70   Temp 98 F (36.7 C) (Oral)   Ht 5\' 8"  (1.727 m)   Wt 178 lb (80.7 kg)   SpO2 98%   BMI 27.06 kg/m      Review of Systems  Constitutional: Negative for appetite change, chills, fatigue and fever.  HENT: Negative for congestion, dental problem, ear pain, hearing loss, sore throat, tinnitus, trouble swallowing and voice change.   Eyes: Negative for pain, discharge and visual disturbance.  Respiratory: Negative for cough, chest tightness, wheezing and stridor.   Cardiovascular: Negative for chest pain, palpitations and leg swelling.  Gastrointestinal: Negative for abdominal distention, abdominal pain, blood in stool, constipation, diarrhea, nausea and vomiting.  Genitourinary: Negative for difficulty urinating, discharge, flank pain, genital sores, hematuria and urgency.  Musculoskeletal: Negative for arthralgias, back pain, gait problem, joint swelling, myalgias and neck stiffness.       Right knee pain of 4-5 weeks duration  Skin: Negative for rash.  Neurological: Negative for dizziness, syncope, speech difficulty, weakness, numbness and headaches.  Hematological: Negative for adenopathy. Does not bruise/bleed easily.  Psychiatric/Behavioral: Negative for behavioral problems and dysphoric mood. The patient is not nervous/anxious.        Objective:   Physical Exam  Constitutional: He appears well-developed and well-nourished.  HENT:  Head: Normocephalic and atraumatic.  Right Ear: External ear normal.  Left Ear: External ear normal.  Nose: Nose normal.  Mouth/Throat: Oropharynx is clear and moist.  Eyes: Conjunctivae and EOM are normal. Pupils are equal, round, and reactive to light. No scleral icterus.  Neck: Normal range of motion. Neck supple. No JVD present. No thyromegaly present.  Cardiovascular: Regular rhythm, normal heart sounds and intact distal pulses. Exam reveals no gallop and no friction rub.  No murmur heard. Pulmonary/Chest: Effort normal  and breath sounds normal. He exhibits no tenderness.  Abdominal: Soft. Bowel sounds are normal. He exhibits no distension and no mass. There is no tenderness.  Genitourinary: Prostate normal and penis normal. Rectal exam shows guaiac negative stool.  Genitourinary Comments: Uncircumcised Prostate minimally enlarged  Musculoskeletal: Normal range of motion. He exhibits no edema or tenderness.  Lymphadenopathy:    He has no cervical adenopathy.  Neurological: He is alert. He has normal reflexes. No cranial nerve deficit. Coordination normal.  Skin: Skin is warm and dry. No rash noted.  Psychiatric: He has a normal mood and affect. His behavior is normal.  Assessment & Plan:   Preventive health examination  essential hypertension well-controlled dyslipidemia continue statin therapy.  Will review a lipid profile  History of AML follow-up [facility] Review screening lab including hemoglobin A1c Follow-up 1 year or as needed   [name]

## 2017-10-22 DIAGNOSIS — M25561 Pain in right knee: Secondary | ICD-10-CM | POA: Diagnosis not present

## 2017-10-22 LAB — HEPATITIS C ANTIBODY
Hepatitis C Ab: NONREACTIVE
SIGNAL TO CUT-OFF: 0.01 (ref <1.00)

## 2017-10-26 ENCOUNTER — Telehealth: Payer: Self-pay | Admitting: Internal Medicine

## 2017-10-26 ENCOUNTER — Encounter: Payer: Self-pay | Admitting: Internal Medicine

## 2017-10-26 NOTE — Telephone Encounter (Signed)
Copied from [place]. Topic: Quick Communication - Lab Results >> Oct 26, 2017 10:35 AM Cecelia Byars, NT wrote: Patient called and would like a call with his labs from last week please call  at [phone]

## 2017-10-28 ENCOUNTER — Encounter: Payer: Self-pay | Admitting: Internal Medicine

## 2017-10-28 DIAGNOSIS — M7051 Other bursitis of knee, right knee: Secondary | ICD-10-CM | POA: Diagnosis not present

## 2017-10-28 NOTE — Telephone Encounter (Signed)
Please call/notify patient that lab/test/procedure is normal; cholesterol 181.  Hemoglobin A1c 6.0

## 2017-10-29 DIAGNOSIS — M25561 Pain in right knee: Secondary | ICD-10-CM | POA: Diagnosis not present

## 2017-10-29 NOTE — Telephone Encounter (Signed)
Spoke to patient and he stated that he received an e-mail yesterday regarding his labs. Patient denied having any question or concerns. No further action needed.

## 2017-10-31 ENCOUNTER — Other Ambulatory Visit: Payer: Self-pay | Admitting: Internal Medicine

## 2017-11-01 ENCOUNTER — Other Ambulatory Visit: Payer: Self-pay | Admitting: Internal Medicine

## 2017-11-05 DIAGNOSIS — M25561 Pain in right knee: Secondary | ICD-10-CM | POA: Diagnosis not present

## 2017-11-05 DIAGNOSIS — S83241D Other tear of medial meniscus, current injury, right knee, subsequent encounter: Secondary | ICD-10-CM | POA: Diagnosis not present

## 2017-11-05 DIAGNOSIS — M2410 Other articular cartilage disorders, unspecified site: Secondary | ICD-10-CM | POA: Diagnosis not present

## 2017-11-11 DIAGNOSIS — J301 Allergic rhinitis due to pollen: Secondary | ICD-10-CM | POA: Diagnosis not present

## 2017-11-21 ENCOUNTER — Other Ambulatory Visit: Payer: Self-pay | Admitting: Internal Medicine

## 2017-11-26 ENCOUNTER — Telehealth: Payer: Self-pay | Admitting: Internal Medicine

## 2017-11-26 NOTE — Telephone Encounter (Signed)
Norvasc 10 mg # 3 called to [facility] in [place].

## 2017-11-26 NOTE — Telephone Encounter (Signed)
Copied from [place] [id number]. Topic: Quick Communication - Rx Refill/Question >> Nov 26, 2017 11:25 AM Cleaster Corin, NT wrote: Medication: amLODipine (NORVASC) 10 MG tablet [[id number]]  Has the patient contacted their pharmacy? yes (Agent: If no, request that the patient contact the pharmacy for the refill.) Preferred Pharmacy (with phone number or street name): [id number] - [place], [place] [address] Phone: [phone] Fax: [phone]   Agent: Please be advised that RX refills may take up to 3 business days. We ask that you follow-up with your pharmacy.

## 2017-12-02 DIAGNOSIS — S83231A Complex tear of medial meniscus, current injury, right knee, initial encounter: Secondary | ICD-10-CM | POA: Diagnosis not present

## 2017-12-02 DIAGNOSIS — M659 Synovitis and tenosynovitis, unspecified: Secondary | ICD-10-CM | POA: Diagnosis not present

## 2017-12-02 DIAGNOSIS — M94261 Chondromalacia, right knee: Secondary | ICD-10-CM | POA: Diagnosis not present

## 2017-12-02 DIAGNOSIS — G8918 Other acute postprocedural pain: Secondary | ICD-10-CM | POA: Diagnosis not present

## 2017-12-02 DIAGNOSIS — X58XXXA Exposure to other specified factors, initial encounter: Secondary | ICD-10-CM | POA: Diagnosis not present

## 2017-12-02 DIAGNOSIS — Y999 Unspecified external cause status: Secondary | ICD-10-CM | POA: Diagnosis not present

## 2017-12-23 DIAGNOSIS — J301 Allergic rhinitis due to pollen: Secondary | ICD-10-CM | POA: Diagnosis not present

## 2017-12-24 DIAGNOSIS — M7051 Other bursitis of knee, right knee: Secondary | ICD-10-CM | POA: Diagnosis not present

## 2018-01-07 DIAGNOSIS — D225 Melanocytic nevi of trunk: Secondary | ICD-10-CM | POA: Diagnosis not present

## 2018-01-07 DIAGNOSIS — L821 Other seborrheic keratosis: Secondary | ICD-10-CM | POA: Diagnosis not present

## 2018-01-07 DIAGNOSIS — L570 Actinic keratosis: Secondary | ICD-10-CM | POA: Diagnosis not present

## 2018-01-07 DIAGNOSIS — D485 Neoplasm of uncertain behavior of skin: Secondary | ICD-10-CM | POA: Diagnosis not present

## 2018-01-07 DIAGNOSIS — Z85828 Personal history of other malignant neoplasm of skin: Secondary | ICD-10-CM | POA: Diagnosis not present

## 2018-01-15 DIAGNOSIS — J301 Allergic rhinitis due to pollen: Secondary | ICD-10-CM | POA: Diagnosis not present

## 2018-01-19 DIAGNOSIS — Z8719 Personal history of other diseases of the digestive system: Secondary | ICD-10-CM | POA: Diagnosis not present

## 2018-01-19 DIAGNOSIS — Z9889 Other specified postprocedural states: Secondary | ICD-10-CM | POA: Diagnosis not present

## 2018-01-19 DIAGNOSIS — J302 Other seasonal allergic rhinitis: Secondary | ICD-10-CM | POA: Diagnosis not present

## 2018-01-19 DIAGNOSIS — Z85828 Personal history of other malignant neoplasm of skin: Secondary | ICD-10-CM | POA: Diagnosis not present

## 2018-01-19 DIAGNOSIS — E785 Hyperlipidemia, unspecified: Secondary | ICD-10-CM | POA: Diagnosis not present

## 2018-01-19 DIAGNOSIS — C9201 Acute myeloblastic leukemia, in remission: Secondary | ICD-10-CM | POA: Diagnosis not present

## 2018-01-19 DIAGNOSIS — I10 Essential (primary) hypertension: Secondary | ICD-10-CM | POA: Diagnosis not present

## 2018-01-22 DIAGNOSIS — J301 Allergic rhinitis due to pollen: Secondary | ICD-10-CM | POA: Diagnosis not present

## 2018-01-25 ENCOUNTER — Other Ambulatory Visit: Payer: Self-pay | Admitting: Internal Medicine

## 2018-02-15 ENCOUNTER — Other Ambulatory Visit: Payer: Self-pay | Admitting: Internal Medicine

## 2018-02-17 ENCOUNTER — Other Ambulatory Visit: Payer: Self-pay | Admitting: Internal Medicine

## 2018-02-17 DIAGNOSIS — J301 Allergic rhinitis due to pollen: Secondary | ICD-10-CM | POA: Diagnosis not present

## 2018-02-19 DIAGNOSIS — J301 Allergic rhinitis due to pollen: Secondary | ICD-10-CM | POA: Diagnosis not present

## 2018-02-25 DIAGNOSIS — J301 Allergic rhinitis due to pollen: Secondary | ICD-10-CM | POA: Diagnosis not present

## 2018-03-10 DIAGNOSIS — J301 Allergic rhinitis due to pollen: Secondary | ICD-10-CM | POA: Diagnosis not present

## 2018-03-17 DIAGNOSIS — M25522 Pain in left elbow: Secondary | ICD-10-CM | POA: Diagnosis not present

## 2018-03-17 DIAGNOSIS — J301 Allergic rhinitis due to pollen: Secondary | ICD-10-CM | POA: Diagnosis not present

## 2018-03-17 DIAGNOSIS — M25552 Pain in left hip: Secondary | ICD-10-CM | POA: Diagnosis not present

## 2018-03-17 DIAGNOSIS — M545 Low back pain: Secondary | ICD-10-CM | POA: Diagnosis not present

## 2018-03-22 DIAGNOSIS — M25552 Pain in left hip: Secondary | ICD-10-CM | POA: Diagnosis not present

## 2018-03-24 DIAGNOSIS — J301 Allergic rhinitis due to pollen: Secondary | ICD-10-CM | POA: Diagnosis not present

## 2018-03-31 DIAGNOSIS — J301 Allergic rhinitis due to pollen: Secondary | ICD-10-CM | POA: Diagnosis not present

## 2018-04-08 DIAGNOSIS — H25813 Combined forms of age-related cataract, bilateral: Secondary | ICD-10-CM | POA: Diagnosis not present

## 2018-04-08 DIAGNOSIS — H40013 Open angle with borderline findings, low risk, bilateral: Secondary | ICD-10-CM | POA: Diagnosis not present

## 2018-04-16 DIAGNOSIS — J301 Allergic rhinitis due to pollen: Secondary | ICD-10-CM | POA: Diagnosis not present

## 2018-04-22 ENCOUNTER — Other Ambulatory Visit: Payer: Self-pay | Admitting: Internal Medicine

## 2018-04-22 MED ORDER — VALACYCLOVIR HCL 500 MG PO TABS: 500.0000 mg | ORAL_TABLET | Freq: Two times a day (BID) | ORAL | 2 refills | Status: DC

## 2018-05-08 ENCOUNTER — Other Ambulatory Visit: Payer: Self-pay | Admitting: Internal Medicine

## 2018-05-20 DIAGNOSIS — J301 Allergic rhinitis due to pollen: Secondary | ICD-10-CM | POA: Diagnosis not present

## 2018-05-27 ENCOUNTER — Other Ambulatory Visit: Payer: Self-pay | Admitting: Internal Medicine

## 2018-05-27 NOTE — Telephone Encounter (Signed)
Left message to return phone call.

## 2018-05-27 NOTE — Telephone Encounter (Signed)
Copied from [place] [id number]. Topic: Quick Communication - Rx Refill/Question >> May 27, 2018  4:21 PM Keene Breath wrote: Medication: sertraline (ZOLOFT) 50 MG tablet  Patient called to request a refill for the above medication.  CB# [phone]  Preferred Pharmacy (with phone number or street name): [id number] [place], [place] [phone] (Phone) [phone] (Fax)

## 2018-05-27 NOTE — Telephone Encounter (Signed)
Requested medication (s) are due for refill today: yes  Requested medication (s) are on the active medication list: yes    Last refill: 11/23/17  Future visit scheduled yes  Notes to clinic:Prescriber Dr. Burnice Logan; New patient appt made with Dr. Volanda Napoleon for 10/27/2018  Requested Prescriptions  Pending Prescriptions Disp Refills   sertraline (ZOLOFT) 50 MG tablet 90 tablet 0    Sig: Take 1 tablet (50 mg total) by mouth daily.     Psychiatry:  Antidepressants - SSRI Failed - 05/27/2018  4:33 PM      Failed - Valid encounter within last 6 months    Recent Outpatient Visits          7 months ago Encounter for preventive health examination   Therapist, [facility] at [facility], Doretha Sou, MD   1 year ago Essential hypertension, benign   Therapist, [facility] at [facility], Doretha Sou, MD   1 year ago Essential hypertension, benign   Therapist, [facility] at [facility], Doretha Sou, MD   1 year ago Allergic disorder, initial [facility], Doretha Sou, MD   1 year ago Encounter for preventive health examination   Therapist, music at [facility], Doretha Sou, MD      Future Appointments            In 5 months Burnice Logan, Doretha Sou, MD [facility] at [place], [place]   In 5 months Volanda Napoleon, Langley Adie, MD [employer] [facility] at [place], [facility]

## 2018-05-28 MED ORDER — SERTRALINE HCL 50 MG PO TABS: 50.0000 mg | ORAL_TABLET | Freq: Every day | ORAL | 1 refills | Status: DC

## 2018-05-28 NOTE — Telephone Encounter (Signed)
Okay for refill? Please advise  Pt is set up for 10/27/2018 with Dr.Banks.

## 2018-06-16 ENCOUNTER — Encounter: Payer: Self-pay | Admitting: Family Medicine

## 2018-06-16 ENCOUNTER — Ambulatory Visit (INDEPENDENT_AMBULATORY_CARE_PROVIDER_SITE_OTHER): Payer: BLUE CROSS/BLUE SHIELD | Admitting: Family Medicine

## 2018-06-16 VITALS — BP 120/78 | HR 92 | Temp 101.2°F | Wt 175.0 lb

## 2018-06-16 DIAGNOSIS — J029 Acute pharyngitis, unspecified: Secondary | ICD-10-CM | POA: Diagnosis not present

## 2018-06-16 DIAGNOSIS — R059 Cough, unspecified: Secondary | ICD-10-CM

## 2018-06-16 DIAGNOSIS — M791 Myalgia, unspecified site: Secondary | ICD-10-CM

## 2018-06-16 DIAGNOSIS — J101 Influenza due to other identified influenza virus with other respiratory manifestations: Secondary | ICD-10-CM

## 2018-06-16 DIAGNOSIS — R05 Cough: Secondary | ICD-10-CM

## 2018-06-16 LAB — POCT INFLUENZA A/B
Influenza A, POC: POSITIVE — AB
Influenza B, POC: NEGATIVE

## 2018-06-16 LAB — POCT RAPID STREP A (OFFICE): RAPID STREP A SCREEN: NEGATIVE

## 2018-06-16 MED ORDER — HYDROCODONE-HOMATROPINE 5-1.5 MG/5ML PO SYRP: 5.0000 mL | ORAL_SOLUTION | Freq: Three times a day (TID) | ORAL | 0 refills | Status: DC | PRN

## 2018-06-16 NOTE — Patient Instructions (Signed)

## 2018-06-16 NOTE — Progress Notes (Signed)
Subjective:    Patient ID: [name], male    DOB: 20-Jan-1956, 62 y.o.   MRN: [id number]  No chief complaint on file. pt accompanied by his wife.  HPI Patient was seen today for acute concern.  Pt endorses sore throat congestion, fever, cough since Sunday.  Pt also endorses fatigue muscle aches, feeling bad in general. Cough keeping pt awake at night.  Pt is unsure of sick contacts, but did attend a a football game on Thursday night and a wedding on Saturday.  Pt is currently taking Tylenol for his symptoms.  Pt received a flu shot in Oct.  Past Medical History:  Diagnosis Date  . Acute myeloid leukemia in remission ([place]) 1999  . Anemia   . Arthritis   . Depression   . Diverticulosis of colon (without mention of hemorrhage)   . Esophageal reflux   . Family hx colonic polyps   . Hiatal hernia   . History of kidney stones   . Hyperlipemia   . Hypertension   . Psychosexual dysfunction with inhibited sexual excitement     Allergies  Allergen Reactions  . Morphine Other (See Comments)    UNKNOWN - REACTION IS NOT LISTED  . Marinol [Dronabinol]     Delusions.    ROS General: Denies chills, night sweats, changes in weight, changes in appetite.   + Fever, muscle aches  HEENT: Denies headaches, ear pain, changes in vision, rhinorrhea  +sore throat, congestion CV: Denies CP, palpitations, SOB, orthopnea Pulm: Denies SOB, wheezing  +cough GI: Denies abdominal pain, nausea, vomiting, diarrhea, constipation GU: Denies dysuria, hematuria, frequency, vaginal discharge Msk: Denies muscle cramps, joint pains Neuro: Denies weakness, numbness, tingling Skin: Denies rashes, bruising Psych: Denies depression, anxiety, hallucinations    Objective:    Blood pressure 120/78, pulse 92, temperature (!) 101.2 F (38.4 C), temperature source Oral, weight 175 lb (79.4 kg), SpO2 96 %.  Gen. Pleasant, well-nourished, in no distress, normal affect   HEENT: [place]/AT, face symmetric,  no  scleral icterus, PERRLA, nares patent without drainage, pharynx with erythema, no exudate. TMs normal b/l.  No cervical lymphadenopathy Lungs: dry cough, no accessory muscle use, CTAB, no wheezes or rales Cardiovascular: RRR, no m/r/g, no peripheral edema  Wt Readings from Last 3 Encounters:  06/16/18 175 lb (79.4 kg)  10/21/17 178 lb (80.7 kg)  03/31/17 176 lb 6.4 oz (80 kg)    Lab Results  Component Value Date   WBC 4.2 10/21/2017   HGB 14.7 10/21/2017   HCT 43.6 10/21/2017   PLT 336.0 10/21/2017   GLUCOSE 105 (H) 10/21/2017   CHOL 181 10/21/2017   TRIG 95.0 10/21/2017   HDL 64.50 10/21/2017   LDLDIRECT 146.2 07/29/2012   LDLCALC 98 10/21/2017   ALT 37 10/21/2017   AST 23 10/21/2017   NA 141 10/21/2017   K 4.4 10/21/2017   CL 102 10/21/2017   CREATININE 1.07 10/21/2017   BUN 20 10/21/2017   CO2 32 10/21/2017   TSH 3.00 10/21/2017   PSA 1.01 10/21/2017   INR 0.83 03/17/2012   HGBA1C 6.0 10/21/2017    Assessment/Plan:  Influenza A -supportive care -outside the window for antiviral meds -given handout -given RTC or ED precautions  Sore throat  - Plan: POCT rapid strep A  Negative  Muscle ache -POC Influenza A/B  Positive  Cough  - Plan: HYDROcodone-homatropine (HYCODAN) 5-1.5 MG/5ML syrup  F/u prn  Grier Mitts, MD

## 2018-06-18 ENCOUNTER — Encounter: Payer: Self-pay | Admitting: Family Medicine

## 2018-06-18 ENCOUNTER — Ambulatory Visit (INDEPENDENT_AMBULATORY_CARE_PROVIDER_SITE_OTHER): Payer: BLUE CROSS/BLUE SHIELD | Admitting: Family Medicine

## 2018-06-18 VITALS — BP 110/64 | HR 74 | Temp 98.3°F | Wt 176.0 lb

## 2018-06-18 DIAGNOSIS — J101 Influenza due to other identified influenza virus with other respiratory manifestations: Secondary | ICD-10-CM

## 2018-06-18 NOTE — Progress Notes (Signed)
Subjective:    Patient ID: [name], male    DOB: 1955-12-20, 62 y.o.   MRN: [id number]  No chief complaint on file. Pt is accompanied by his wife.  HPI Patient was seen today for for ongoing concern.  Pt was seen on 06/16/2018 for influenza A.  Pt endorses headaches, no energy/ "feeling wimpy".  Fever improved.  Pt has been resting, staying hydrated, taking Tylenol, and using nasal rinse.  Pt states he thinks he has a sinus infection.  Pt denies facial pain or pressure, ear pain or pressure, tooth pain.  Of note, pt is expecting a grandchild at any moment, but cannot visit 2/2 current illness.  He also planned to go to the [employer] in [place] this wknd, but can't.  Past Medical History:  Diagnosis Date  . Acute myeloid leukemia in remission ([place]) 1999  . Anemia   . Arthritis   . Depression   . Diverticulosis of colon (without mention of hemorrhage)   . Esophageal reflux   . Family hx colonic polyps   . Hiatal hernia   . History of kidney stones   . Hyperlipemia   . Hypertension   . Psychosexual dysfunction with inhibited sexual excitement     Allergies  Allergen Reactions  . Morphine Other (See Comments)    UNKNOWN - REACTION IS NOT LISTED  . Marinol [Dronabinol]     Delusions.    ROS General: Denies fever, chills, night sweats, changes in weight, changes in appetite  +fatigue HEENT: Denies ear pain, changes in vision, rhinorrhea, sore throat  +HA, congestion CV: Denies CP, palpitations, SOB, orthopnea Pulm: Denies SOB, cough, wheezing GI: Denies abdominal pain, nausea, vomiting, diarrhea, constipation GU: Denies dysuria, hematuria, frequency, vaginal discharge Msk: Denies muscle cramps, joint pains Neuro: Denies weakness, numbness, tingling Skin: Denies rashes, bruising Psych: Denies depression, anxiety, hallucinations    Objective:    Blood pressure 110/64, pulse 74, temperature 98.3 F (36.8 C), temperature source Oral, weight 176 lb (79.8 kg),  SpO2 96 %.  Gen. Pleasant, well-nourished, in no distress, normal affect  HEENT: [place]/AT, face symmetric, no scleral icterus, PERRLA, nares patent without drainage, pharynx without erythema or exudate. TMs normal b/l.  No TTP of maxillary, ethmoid, or frontal sinuses.  No cervical lymphadenopathy. Lungs: no accessory muscle use, CTAB, no wheezes or rales Cardiovascular: RRR, no m/r/g, no peripheral edema  Wt Readings from Last 3 Encounters:  06/18/18 176 lb (79.8 kg)  06/16/18 175 lb (79.4 kg)  10/21/17 178 lb (80.7 kg)    Lab Results  Component Value Date   WBC 4.2 10/21/2017   HGB 14.7 10/21/2017   HCT 43.6 10/21/2017   PLT 336.0 10/21/2017   GLUCOSE 105 (H) 10/21/2017   CHOL 181 10/21/2017   TRIG 95.0 10/21/2017   HDL 64.50 10/21/2017   LDLDIRECT 146.2 07/29/2012   LDLCALC 98 10/21/2017   ALT 37 10/21/2017   AST 23 10/21/2017   NA 141 10/21/2017   K 4.4 10/21/2017   CL 102 10/21/2017   CREATININE 1.07 10/21/2017   BUN 20 10/21/2017   CO2 32 10/21/2017   TSH 3.00 10/21/2017   PSA 1.01 10/21/2017   INR 0.83 03/17/2012   HGBA1C 6.0 10/21/2017    Assessment/Plan:  Influenza A  -Patient reassured.  Advised symptoms may last 4-10 days -Continue supportive care -Given RTC or ED precautions  Follow-up PRN  Grier Mitts, MD

## 2018-06-20 DIAGNOSIS — H109 Unspecified conjunctivitis: Secondary | ICD-10-CM | POA: Diagnosis not present

## 2018-06-20 DIAGNOSIS — B9689 Other specified bacterial agents as the cause of diseases classified elsewhere: Secondary | ICD-10-CM | POA: Diagnosis not present

## 2018-06-20 DIAGNOSIS — J019 Acute sinusitis, unspecified: Secondary | ICD-10-CM | POA: Diagnosis not present

## 2018-06-20 DIAGNOSIS — J101 Influenza due to other identified influenza virus with other respiratory manifestations: Secondary | ICD-10-CM | POA: Diagnosis not present

## 2018-06-23 DIAGNOSIS — J301 Allergic rhinitis due to pollen: Secondary | ICD-10-CM | POA: Diagnosis not present

## 2018-07-13 DIAGNOSIS — K219 Gastro-esophageal reflux disease without esophagitis: Secondary | ICD-10-CM | POA: Diagnosis not present

## 2018-07-13 DIAGNOSIS — J301 Allergic rhinitis due to pollen: Secondary | ICD-10-CM | POA: Diagnosis not present

## 2018-07-13 DIAGNOSIS — R05 Cough: Secondary | ICD-10-CM | POA: Diagnosis not present

## 2018-07-13 DIAGNOSIS — J3089 Other allergic rhinitis: Secondary | ICD-10-CM | POA: Diagnosis not present

## 2018-07-16 ENCOUNTER — Other Ambulatory Visit: Payer: Self-pay | Admitting: Internal Medicine

## 2018-07-21 ENCOUNTER — Other Ambulatory Visit: Payer: Self-pay | Admitting: *Deleted

## 2018-08-12 ENCOUNTER — Encounter: Payer: Self-pay | Admitting: Family Medicine

## 2018-08-17 ENCOUNTER — Encounter: Payer: Self-pay | Admitting: Gastroenterology

## 2018-08-17 ENCOUNTER — Encounter: Payer: Self-pay | Admitting: Family Medicine

## 2018-08-24 DIAGNOSIS — M25522 Pain in left elbow: Secondary | ICD-10-CM | POA: Diagnosis not present

## 2018-08-24 DIAGNOSIS — M545 Low back pain: Secondary | ICD-10-CM | POA: Diagnosis not present

## 2018-08-24 DIAGNOSIS — M25552 Pain in left hip: Secondary | ICD-10-CM | POA: Diagnosis not present

## 2018-08-25 DIAGNOSIS — J301 Allergic rhinitis due to pollen: Secondary | ICD-10-CM | POA: Diagnosis not present

## 2018-08-26 ENCOUNTER — Ambulatory Visit (INDEPENDENT_AMBULATORY_CARE_PROVIDER_SITE_OTHER): Payer: BLUE CROSS/BLUE SHIELD | Admitting: Gastroenterology

## 2018-08-26 ENCOUNTER — Encounter: Payer: Self-pay | Admitting: Gastroenterology

## 2018-08-26 VITALS — BP 130/88 | HR 72 | Ht 70.0 in | Wt 180.2 lb

## 2018-08-26 DIAGNOSIS — R1310 Dysphagia, unspecified: Secondary | ICD-10-CM

## 2018-08-26 DIAGNOSIS — K219 Gastro-esophageal reflux disease without esophagitis: Secondary | ICD-10-CM | POA: Diagnosis not present

## 2018-08-26 DIAGNOSIS — K449 Diaphragmatic hernia without obstruction or gangrene: Secondary | ICD-10-CM

## 2018-08-26 NOTE — Progress Notes (Signed)
[facility] VISIT   Primary Care Provider Patient, No Pcp Per No address on file None  Referring Provider Marletta Lor, MD [address] [place], [place] [address] [phone]  Patient Profile: [name] is a 63 y.o. male with a pmh significant for s/p Auto-SCT for AML, Diverticulosis, MDD/Anxiety, Arthritis, HTN, HLD, Nephrolithiasis, Hiatal Hernia, GERD.  he patient presents to the [facility] for an evaluation and management of problem(s) noted below:  Problem List 1. Gastroesophageal reflux disease, esophagitis presence not specified   2. Hiatal hernia   3. Dysphagia, unspecified type     History of Present Illness:T This is the patient's first visit to the [facility] outpatient clinic in years.  He states over 20 years ago, the patient was diagnosed by Dr. Sharlett Iles as having GERD, Acid Reflux, and a Hiatal hernia.  He has been on PPIs for years now.  Prilosec has been his medication of choice for years.  He had been doing relatively well, with minimal breakthrough acid reflux episodes unless he forgot to take his medications for an extended time period.  In November, he was diagnosed with a pneumonia.  After this pneumonia improved, he still had production of phlegm as well as coughing.  He was seen by his Allergist who thought the patient may have a GERD exacerbation and thought it may be reasonable to start higher dose PPI or at least twice daily.  After a few weeks, the coughing subsided.  However, he still had issues with throat clearing and phlegm production.  His breathing is normal currently.  In the last few months as well, along the same timeline as noted above, the patient began to experience issues of a choking sensation of food getting caught in the midchest.  He has not had to regurgitate foods but with drinking liquids he has things pass.  The patient has not had an EGD in years.  GI Review of  Systems Positive as above Negative for odynophagia, abdominal pain, early satiety, nausea, weight loss, change in bowel habits, melena, hematochezia, hematemesis, coffee-ground emesis  Review of Systems General: Denies fevers/chills HEENT: As noted above in regards to throat clearing; denies oral lesions/sore throat/waterbrash Cardiovascular: Denies chest pain/palpitations Pulmonary: Denies shortness of breath/cough Gastroenterological: See HPI Genitourinary: Denies darkened urine Hematological: Denies easy bruising/bleeding Dermatological: Denies jaundice Psychological: Mood is stable   Medications Current Outpatient Medications  Medication Sig Dispense Refill  . amLODipine (NORVASC) 10 MG tablet TAKE 1 TABLET BY MOUTH EVERY DAY 90 tablet 0  . EPIPEN 2-PAK 0.3 MG/0.3ML DEVI Inject 0.3 mg into the muscle as needed.     . Nutritional Supplements (JUICE PLUS FIBRE PO) Take by mouth. 2 tabs twice daily    . omeprazole (PRILOSEC OTC) 20 MG tablet Take 20 mg by mouth daily.      . pravastatin (PRAVACHOL) 20 MG tablet TAKE 1 TABLET (20 MG TOTAL) BY MOUTH DAILY. 90 tablet 3  . Saw Palmetto, Serenoa repens, (SAW PALMETTO PO) Take by mouth.    . sertraline (ZOLOFT) 50 MG tablet TAKE 1 TABLET BY MOUTH EVERY DAY 87 tablet 3  . sildenafil (VIAGRA) 100 MG tablet Take 0.5 tablets (50 mg total) by mouth as needed for erectile dysfunction. 30 tablet 6  . UNABLE TO FIND 1 tablet 3 (three) times daily. Med Name: Turmeric & Bromelain 1300mg -150mg     . valACYclovir (VALTREX) 500 MG tablet Take 1 tablet (500 mg total) by mouth 2 (two) times daily. 1  tablet twice daily for 1 day for each  episode of herpes simplex (Patient taking differently: Take 500 mg by mouth as needed. 1 tablet twice daily for 1 day for each  episode of herpes simplex) 20 tablet 2   No current facility-administered medications for this visit.     Allergies Allergies  Allergen Reactions  . Morphine Other (See Comments)    UNKNOWN  - REACTION IS NOT LISTED  . Marinol [Dronabinol]     Delusions.    Histories Past Medical History:  Diagnosis Date  . Acute myeloid leukemia in remission ([place]) 1999  . Anemia   . Arthritis   . Depression   . Diverticulosis of colon (without mention of hemorrhage)   . Esophageal reflux   . Family hx colonic polyps   . Hiatal hernia   . History of kidney stones   . Hyperlipemia   . Hypertension   . Psychosexual dysfunction with inhibited sexual excitement    Past Surgical History:  Procedure Laterality Date  . SHOULDER SURGERY     x 3    Social History   Socioeconomic History  . Marital status: Married    Spouse name: Not on file  . Number of children: 2  . Years of education: Not on file  . Highest education level: Not on file  Occupational History  . Occupation: self employed    Employer: [employer]  Social Needs  . Financial resource strain: Not on file  . Food insecurity:    Worry: Not on file    Inability: Not on file  . Transportation needs:    Medical: Not on file    Non-medical: Not on file  Tobacco Use  . Smoking status: Never Smoker  . Smokeless tobacco: Never Used  Substance and Sexual Activity  . Alcohol use: Yes    Comment: one occ   . Drug use: No  . Sexual activity: Never  Lifestyle  . Physical activity:    Days per week: Not on file    Minutes per session: Not on file  . Stress: Not on file  Relationships  . Social connections:    Talks on phone: Not on file    Gets together: Not on file    Attends religious service: Not on file    Active member of club or organization: Not on file    Attends meetings of clubs or organizations: Not on file    Relationship status: Not on file  . Intimate partner violence:    Fear of current or ex partner: Not on file    Emotionally abused: Not on file    Physically abused: Not on file    Forced sexual activity: Not on file  Other Topics Concern  . Not on file  Social History Narrative   2  caffeine drinks daily    Family History  Problem Relation Age of Onset  . Hypertension Father   . Heart disease Father   . Prostate cancer Father        dx late 70s  . Cancer Father 102       prostate ca  . Celiac disease Mother   . Colon polyps Mother   . Breast cancer Mother   . Cancer Mother        Bladder  . Colon cancer Neg Hx   . Esophageal cancer Neg Hx   . Inflammatory bowel disease Neg Hx   . Liver disease Neg Hx   . Pancreatic cancer  Neg Hx   . Rectal cancer Neg Hx   . Stomach cancer Neg Hx    I have reviewed his medical, social, and family history in detail and updated the electronic medical record as necessary.    PHYSICAL EXAMINATION  BP 130/88   Pulse 72   Ht 5\' 10"  (1.778 m)   Wt 180 lb 4 oz (81.8 kg)   BMI 25.86 kg/m  Wt Readings from Last 3 Encounters:  08/26/18 180 lb 4 oz (81.8 kg)  06/18/18 176 lb (79.8 kg)  06/16/18 175 lb (79.4 kg)  GEN: NAD, appears stated age, doesn't appear chronically ill PSYCH: Cooperative, without pressured speech EYE: Conjunctivae pink, sclerae anicteric ENT: MMM, without oral ulcers, no erythema or exudates noted NECK: Supple CV: RR without R/Gs  RESP: CTAB posteriorly, without wheezing GI: NABS, soft, NT/ND, without rebound or guarding, no HSM appreciated MSK/EXT: No lower extremity edema SKIN: No jaundice NEURO:  Alert & Oriented x 3, no focal deficits   REVIEW OF DATA  I reviewed the following data at the time of this encounter:  GI Procedures and Studies  2007 Colonoscopy Diverticulosis  2012 EGD Hiatal hernia that was 5 cm in size.  The rest of the esophagus and the Z-line was normal.  No Barrett's mucosa was noted.  Stomach was normal appearing.  Duodenum was unremarkable.  Duodenal biopsies performed.    2012 Colonoscopy Scattered diverticula found throughout the colon.  Otherwise, normal examination.   Plan for a 10-year follow up as he had 2 normal examinations done for FHx of colon  polyps  Laboratory Studies  Reviewed in epic  Imaging Studies  No relevant studies to review   ASSESSMENT  Mr. Mcenery is a 63 y.o. male with a pmh significant for s/p Auto-SCT for AML, Diverticulosis, MDD/Anxiety, Arthritis, HTN, HLD, Nephrolithiasis, Hiatal Hernia, GERD.  The patient is seen today for evaluation and management of:  1. Gastroesophageal reflux disease, esophagitis presence not specified   2. Hiatal hernia   3. Dysphagia, unspecified type    This is a hemodynamically stable patient who presents for evaluation of his chronic GERD as well as newer symptoms of throat clearing with cough as well as infrequent though present dysphagia.  We will increase his PPI to 40 mg BID for a period of the next 6-8 weeks.  Based on how he is doing, then we will determine the need for an EGD to be performed based on persistent or worsening symptoms.  Even if throat clearing and cough are improved as well as GERD, if there is any dysphagia, we should look at the esophagus to rule out strictures/malignancy/eosinophilic/esophagitis/lymphocytic esophagitis.  The risks and benefits of endoscopic evaluation were discussed with the patient; these include but are not limited to the risk of perforation, infection, bleeding, missed lesions, lack of diagnosis, severe illness requiring hospitalization, as well as anesthesia and sedation related illnesses.  The patient is agreeable to proceed.  All patient questions were answered, to the best of my ability, and the patient agrees to the aforementioned plan of action with follow-up as indicated.   PLAN  Increase Omeprazole to 40 mg BID Plan for EGD with Esophageal biopsies in 6-8 weeks based on symptoms Follow-up to be determined thereafter Depending on size of hiatal hernia and symptoms may require additional work-up or surgical evaluation   Orders Placed This Encounter  Procedures  . Ambulatory referral to Gastroenterology    New Prescriptions   No  medications on file  Modified Medications   No medications on file    Planned Follow Up: No follow-ups on file.   Justice Britain, MD [facility]oscopy Office # [phone]

## 2018-08-26 NOTE — Patient Instructions (Signed)
You have been scheduled for an endoscopy. Please follow written instructions given to you at your visit today. If you use inhalers (even only as needed), please bring them with you on the day of your procedure. Your physician has requested that you go to www.startemmi.com and enter the access code given to you at your visit today. This web site gives a general overview about your procedure. However, you should still follow specific instructions given to you by our office regarding your preparation for the procedure.  Thank you for entrusting me with your care and choosing [facility] care.  Dr Rush Landmark

## 2018-08-28 ENCOUNTER — Encounter: Payer: Self-pay | Admitting: Gastroenterology

## 2018-08-28 DIAGNOSIS — R1310 Dysphagia, unspecified: Secondary | ICD-10-CM | POA: Insufficient documentation

## 2018-08-31 ENCOUNTER — Ambulatory Visit (AMBULATORY_SURGERY_CENTER): Payer: BLUE CROSS/BLUE SHIELD | Admitting: Gastroenterology

## 2018-08-31 ENCOUNTER — Encounter: Payer: Self-pay | Admitting: Gastroenterology

## 2018-08-31 VITALS — BP 122/79 | HR 67 | Temp 97.1°F | Resp 24 | Ht 70.0 in | Wt 180.0 lb

## 2018-08-31 DIAGNOSIS — K219 Gastro-esophageal reflux disease without esophagitis: Secondary | ICD-10-CM

## 2018-08-31 DIAGNOSIS — K317 Polyp of stomach and duodenum: Secondary | ICD-10-CM

## 2018-08-31 DIAGNOSIS — K2970 Gastritis, unspecified, without bleeding: Secondary | ICD-10-CM

## 2018-08-31 DIAGNOSIS — K3189 Other diseases of stomach and duodenum: Secondary | ICD-10-CM | POA: Diagnosis not present

## 2018-08-31 DIAGNOSIS — K2990 Gastroduodenitis, unspecified, without bleeding: Secondary | ICD-10-CM

## 2018-08-31 DIAGNOSIS — R1310 Dysphagia, unspecified: Secondary | ICD-10-CM | POA: Diagnosis not present

## 2018-08-31 MED ORDER — SODIUM CHLORIDE 0.9 % IV SOLN: 500.0000 mL | INTRAVENOUS | Status: DC

## 2018-08-31 NOTE — Progress Notes (Signed)
A and O x3. Report to RN. Tolerated MAC anesthesia well.Teeth unchanged after procedure.

## 2018-08-31 NOTE — Op Note (Signed)
[facility] Patient Name: [name] Procedure Date: 08/31/2018 8:32 AM MRN: [id number] Endoscopist: Justice Britain , MD Age: 64 Referring MD:  Date of Birth: 1955-09-18 Gender: Male Account #: [id number] Procedure:                Upper GI endoscopy Indications:              Dysphagia, Heartburn, Gastro-esophageal reflux                            disease, Esophageal reflux symptoms that recur                            despite appropriate therapy Medicines:                Monitored Anesthesia Care Procedure:                Pre-Anesthesia Assessment:                           - Prior to the procedure, a History and Physical                            was performed, and patient medications and                            allergies were reviewed. The patient's tolerance of                            previous anesthesia was also reviewed. The risks                            and benefits of the procedure and the sedation                            options and risks were discussed with the patient.                            All questions were answered, and informed consent                            was obtained. Prior Anticoagulants: The patient has                            taken no previous anticoagulant or antiplatelet                            agents. ASA Grade Assessment: II - A patient with                            mild systemic disease. After reviewing the risks                            and benefits, the patient was deemed in  satisfactory condition to undergo the procedure.                           After obtaining informed consent, the endoscope was                            passed under direct vision. Throughout the                            procedure, the patient's blood pressure, pulse, and                            oxygen saturations were monitored continuously. The                            Endoscope was introduced  through the mouth, and                            advanced to the second part of duodenum. The upper                            GI endoscopy was accomplished without difficulty.                            The patient tolerated the procedure. Scope In: Scope Out: Findings:                 No gross lesions were noted in the proximal                            esophagus, in the mid esophagus and in the distal                            esophagus. Biopsies were taken with a cold forceps                            for histology from the proximal/middle esophagus                            for EoE. Biopsies were taken with a cold forceps                            for histology from the distal esophagus for EoE.                           LA Grade B (one or more mucosal breaks greater than                            5 mm, not extending between the tops of two mucosal                            folds) esophagitis with no bleeding was found at  the gastroesophageal junction. This was biopsied                            with a cold forceps for histology.                           A 4 cm hiatal hernia was found. The proximal extent                            of the gastric folds (end of tubular esophagus) was                            39 cm from the incisors. The hiatal narrowing was                            42 cm from the incisors. The Z-line was 38 cm from                            the incisors and slightly irregular due to the                            esophagitis.                           Multiple small pedunculated and sessile polyps with                            no stigmata of recent bleeding were found in the                            cardia, in the gastric fundus and in the gastric                            body - typical of fundic gland polyps. Biopsies                            were taken with a cold forceps for histology from a                             few of the polyps.                           Patchy moderately erythematous mucosa without                            bleeding was found in the cardia, in the gastric                            fundus and in the gastric antrum.                           No gross lesions were noted in the entire examined  stomach. Biopsies were taken with a cold forceps                            for histology and Helicobacter pylori testing from                            the antrum/incisura/greater curve/lesser                            curve/cardia/fundus.                           No gross lesions were noted in the duodenal bulb,                            in the first portion of the duodenum and in the                            second portion of the duodenum. Complications:            No immediate complications. Estimated Blood Loss:     Estimated blood loss was minimal. Impression:               - No gross lesions in esophagus. Biopsied.                           - LA Grade B reflux esophagitis at the GE                            Junction/Z-Line. Biopsied.                           - 4 cm hiatal hernia.                           - Multiple gastric polyps - likely fundic gland.                            Biopsied.                           - Erythematous mucosa in the cardia, gastric fundus                            and antrum. No other gross lesions in the stomach.                            Biopsied for HP.                           - No gross lesions in the duodenal bulb, in the                            first portion of the duodenum and in the second  portion of the duodenum. Recommendation:           - The patient will be observed post-procedure,                            until all discharge criteria are met.                           - Discharge patient to home.                           - Patient has a contact number available for                             emergencies. The signs and symptoms of potential                            delayed complications were discussed with the                            patient. Return to normal activities tomorrow.                            Written discharge instructions were provided to the                            patient.                           - Resume previous diet.                           - Would ensure patient is taking 40 mg PO PPI BID                            for next 2-3 months to see how symptoms do with                            this trial of medications.                           - Await pathology results.                           - Repeat upper endoscopy for surveillance based on                            pathology results.                           - The findings and recommendations were discussed                            with the patient.                           -  The findings and recommendations were discussed                            with the patient's family. Justice Britain, MD 08/31/2018 9:01:29 AM

## 2018-08-31 NOTE — Patient Instructions (Addendum)
You had gastritis and gastric polyps today.  Handouts given on gastritis and GERD protocol (hiatal hernia). Take your PPI 40mg  twice daily.   YOU HAD AN ENDOSCOPIC PROCEDURE TODAY AT [facility]:   Refer to the procedure report that was given to you for any specific questions about what was found during the examination.  If the procedure report does not answer your questions, please call your gastroenterologist to clarify.  If you requested that your care partner not be given the details of your procedure findings, then the procedure report has been included in a sealed envelope for you to review at your convenience later.  YOU SHOULD EXPECT: Some feelings of bloating in the abdomen. Passage of more gas than usual.  Walking can help get rid of the air that was put into your GI tract during the procedure and reduce the bloating. If you had a lower endoscopy (such as a colonoscopy or flexible sigmoidoscopy) you may notice spotting of blood in your stool or on the toilet paper. If you underwent a bowel prep for your procedure, you may not have a normal bowel movement for a few days.  Please Note:  You might notice some irritation and congestion in your nose or some drainage.  This is from the oxygen used during your procedure.  There is no need for concern and it should clear up in a day or so.  SYMPTOMS TO REPORT IMMEDIATELY:   Following upper endoscopy (EGD)  Vomiting of blood or coffee ground material  New chest pain or pain under the shoulder blades  Painful or persistently difficult swallowing  New shortness of breath  Fever of 100F or higher  Black, tarry-looking stools  For urgent or emergent issues, a gastroenterologist can be reached at any hour by calling [phone].   DIET:  We do recommend a small meal at first, but then you may proceed to your regular diet.  Drink plenty of fluids but you should avoid alcoholic beverages for 24 hours.  ACTIVITY:  You should  plan to take it easy for the rest of today and you should NOT DRIVE or use heavy machinery until tomorrow (because of the sedation medicines used during the test).    FOLLOW UP: Our staff will call the number listed on your records the next business day following your procedure to check on you and address any questions or concerns that you may have regarding the information given to you following your procedure. If we do not reach you, we will leave a message.  However, if you are feeling well and you are not experiencing any problems, there is no need to return our call.  We will assume that you have returned to your regular daily activities without incident.  If any biopsies were taken you will be contacted by phone or by letter within the next 1-3 weeks.  Please call us at [phone] if you have not heard about the biopsies in 3 weeks.    SIGNATURES/CONFIDENTIALITY: You and/or your care partner have signed paperwork which will be entered into your electronic medical record.  These signatures attest to the fact that that the information above on your After Visit Summary has been reviewed and is understood.  Full responsibility of the confidentiality of this discharge information lies with you and/or your care-partner.

## 2018-08-31 NOTE — Progress Notes (Signed)
Called to room to assist during endoscopic procedure.  Patient ID and intended procedure confirmed with present staff. Received instructions for my participation in the procedure from the performing physician.  

## 2018-09-01 ENCOUNTER — Telehealth: Payer: Self-pay

## 2018-09-01 DIAGNOSIS — J301 Allergic rhinitis due to pollen: Secondary | ICD-10-CM | POA: Diagnosis not present

## 2018-09-01 NOTE — Telephone Encounter (Signed)
  Follow up Call-  Call back number 08/31/2018  Post procedure Call Back phone  # [phone]  Permission to leave phone message Yes  Some recent data might be hidden     Patient questions:  Do you have a fever, pain , or abdominal swelling? No. Pain Score  0 *  Have you tolerated food without any problems? Yes.    Have you been able to return to your normal activities? Yes.    Do you have any questions about your discharge instructions: Diet   No. Medications  No. Follow up visit  No.  Do you have questions or concerns about your Care? No.  Actions: * If pain score is 4 or above: No action needed, pain <4.

## 2018-09-02 DIAGNOSIS — L821 Other seborrheic keratosis: Secondary | ICD-10-CM | POA: Diagnosis not present

## 2018-09-02 DIAGNOSIS — L438 Other lichen planus: Secondary | ICD-10-CM | POA: Diagnosis not present

## 2018-09-03 ENCOUNTER — Encounter: Payer: Self-pay | Admitting: Gastroenterology

## 2018-09-14 DIAGNOSIS — M25522 Pain in left elbow: Secondary | ICD-10-CM | POA: Diagnosis not present

## 2018-09-14 DIAGNOSIS — M25552 Pain in left hip: Secondary | ICD-10-CM | POA: Diagnosis not present

## 2018-09-14 DIAGNOSIS — M545 Low back pain: Secondary | ICD-10-CM | POA: Diagnosis not present

## 2018-09-15 DIAGNOSIS — J301 Allergic rhinitis due to pollen: Secondary | ICD-10-CM | POA: Diagnosis not present

## 2018-09-22 DIAGNOSIS — J301 Allergic rhinitis due to pollen: Secondary | ICD-10-CM | POA: Diagnosis not present

## 2018-09-23 ENCOUNTER — Ambulatory Visit (INDEPENDENT_AMBULATORY_CARE_PROVIDER_SITE_OTHER): Payer: BLUE CROSS/BLUE SHIELD | Admitting: Family Medicine

## 2018-09-23 ENCOUNTER — Encounter: Payer: Self-pay | Admitting: Family Medicine

## 2018-09-23 VITALS — BP 116/72 | HR 65 | Temp 98.2°F | Ht 70.0 in | Wt 179.0 lb

## 2018-09-23 DIAGNOSIS — F419 Anxiety disorder, unspecified: Secondary | ICD-10-CM

## 2018-09-23 DIAGNOSIS — K219 Gastro-esophageal reflux disease without esophagitis: Secondary | ICD-10-CM | POA: Diagnosis not present

## 2018-09-23 DIAGNOSIS — I10 Essential (primary) hypertension: Secondary | ICD-10-CM

## 2018-09-23 DIAGNOSIS — E785 Hyperlipidemia, unspecified: Secondary | ICD-10-CM | POA: Diagnosis not present

## 2018-09-23 DIAGNOSIS — Z8619 Personal history of other infectious and parasitic diseases: Secondary | ICD-10-CM

## 2018-09-23 DIAGNOSIS — F528 Other sexual dysfunction not due to a substance or known physiological condition: Secondary | ICD-10-CM | POA: Diagnosis not present

## 2018-09-23 DIAGNOSIS — J302 Other seasonal allergic rhinitis: Secondary | ICD-10-CM | POA: Insufficient documentation

## 2018-09-23 DIAGNOSIS — C9201 Acute myeloblastic leukemia, in remission: Secondary | ICD-10-CM

## 2018-09-23 MED ORDER — PRAVASTATIN SODIUM 20 MG PO TABS: 20.0000 mg | ORAL_TABLET | Freq: Every day | ORAL | 3 refills | Status: DC

## 2018-09-23 MED ORDER — VALACYCLOVIR HCL 500 MG PO TABS: 500.0000 mg | ORAL_TABLET | Freq: Two times a day (BID) | ORAL | 2 refills | Status: DC

## 2018-09-23 MED ORDER — SILDENAFIL CITRATE 100 MG PO TABS: 50.0000 mg | ORAL_TABLET | ORAL | 6 refills | Status: DC | PRN

## 2018-09-23 MED ORDER — AMLODIPINE BESYLATE 10 MG PO TABS: 10.0000 mg | ORAL_TABLET | Freq: Every day | ORAL | 3 refills | Status: DC

## 2018-09-23 MED ORDER — OMEPRAZOLE 40 MG PO CPDR: 40.0000 mg | DELAYED_RELEASE_CAPSULE | Freq: Every day | ORAL | 3 refills | Status: DC

## 2018-09-23 MED ORDER — SERTRALINE HCL 50 MG PO TABS: 50.0000 mg | ORAL_TABLET | Freq: Every day | ORAL | 3 refills | Status: DC

## 2018-09-23 NOTE — Assessment & Plan Note (Signed)
Stable.  Continue management per heme-onc.

## 2018-09-23 NOTE — Assessment & Plan Note (Signed)
Last LDL 98.  Continue pravastatin 20 mg daily.  He will follow-up with me soon for CPE with blood work.

## 2018-09-23 NOTE — Assessment & Plan Note (Signed)
Stable.  Continue Valtrex as needed. 

## 2018-09-23 NOTE — Assessment & Plan Note (Signed)
Stable.  Continue Viagra as needed.

## 2018-09-23 NOTE — Assessment & Plan Note (Signed)
Continue management per allergy. 

## 2018-09-23 NOTE — Assessment & Plan Note (Signed)
Stable.  Continue omeprazole 40 mg twice daily as directed by GI.

## 2018-09-23 NOTE — Progress Notes (Signed)
   Chief Complaint:  Melvin [name] is a 63 y.o. male who presents today with a chief complaint of hypertension follow up.   Assessment/Plan:  GERD with hiatal hernia Stable.  Continue omeprazole 40 mg twice daily as directed by GI.  Essential hypertension, benign At goal.  Continue amlodipine 10 mg daily.  ERECTILE DYSFUNCTION, NON-ORGANIC Stable.  Continue Viagra as needed.  Dyslipidemia Last LDL 98.  Continue pravastatin 20 mg daily.  He will follow-up with me soon for CPE with blood work.  AML (acute myeloid leukemia) in remission ([place]) s/p BMT 2000 Stable.  Continue management per heme-onc.  Seasonal allergies Continue management per allergy.  History of cold sores Stable.  Continue Valtrex as needed.  Anxiety Stable.  Continue Zoloft 50 mg daily.     Subjective:  HPI:  His stable, chronic medical conditions are outlined below:  # Essential Hypertension - On norvasc 10mg  daily and tolerating well - ROS: No reported chest [name] or shortness of breath  # Dyslipidemia - On pravastatin 20mg  daily and tolerating well - ROS: No reported myalgias  # Erectile Dysfunction - Uses sildenafil as needed.  Tolerating well.  #  Anxiety - On Zoloft 50mg  daily and tolerating well - ROS: No SI or HI  # History of cold sore - Uses Valtrex as needed for outbreaks.  % GERD with hiatal hernia - Follows with GI -On omeprazole 40 mg twice daily and tolerating well.  % Seasonal Allergies - Follows with allergy  % AML in Remission s/p bone marrow transplant 2000 - Follows with heme-onc at [facility]: Per HPI  PMH: He reports that he has never smoked. He has never used smokeless tobacco. He reports current alcohol use. He reports that he does not use drugs.      Objective:  Physical Exam: BP 116/72 (BP Location: Left Arm, Patient Position: Sitting, Cuff Size: Normal)   Pulse 65   Temp 98.2 F (36.8 C) (Oral)   Ht 5\' 10"  (1.778 m)   Wt 179 lb (81.2 kg)    SpO2 99%   BMI 25.68 kg/m   Gen: NAD, resting comfortably CV: Regular rate and rhythm with no murmurs appreciated Pulm: Normal work of breathing, clear to auscultation bilaterally with no crackles, wheezes, or rhonchi MSK: No edema     Melvin [name] M. Melvin Pain, MD 09/23/2018 9:33 AM

## 2018-09-23 NOTE — Patient Instructions (Signed)
It was very nice to see you today!  I will refill your medications today.  No changes today.  Please come back soon for your physical, or sooner as needed.  Take care, Dr [name]

## 2018-09-23 NOTE — Assessment & Plan Note (Signed)
At goal  Continue amlodipine 10mg daily

## 2018-09-23 NOTE — Assessment & Plan Note (Signed)
Stable.  Continue Zoloft 50 mg daily. 

## 2018-09-29 DIAGNOSIS — J301 Allergic rhinitis due to pollen: Secondary | ICD-10-CM | POA: Diagnosis not present

## 2018-10-06 DIAGNOSIS — J301 Allergic rhinitis due to pollen: Secondary | ICD-10-CM | POA: Diagnosis not present

## 2018-10-07 DIAGNOSIS — M25522 Pain in left elbow: Secondary | ICD-10-CM | POA: Diagnosis not present

## 2018-10-07 DIAGNOSIS — M25552 Pain in left hip: Secondary | ICD-10-CM | POA: Diagnosis not present

## 2018-10-07 DIAGNOSIS — M545 Low back pain: Secondary | ICD-10-CM | POA: Diagnosis not present

## 2018-10-27 ENCOUNTER — Ambulatory Visit (INDEPENDENT_AMBULATORY_CARE_PROVIDER_SITE_OTHER): Payer: BLUE CROSS/BLUE SHIELD | Admitting: Family Medicine

## 2018-10-27 ENCOUNTER — Ambulatory Visit: Payer: BLUE CROSS/BLUE SHIELD | Admitting: Family Medicine

## 2018-10-27 ENCOUNTER — Encounter: Payer: BLUE CROSS/BLUE SHIELD | Admitting: Internal Medicine

## 2018-10-27 ENCOUNTER — Encounter: Payer: Self-pay | Admitting: Family Medicine

## 2018-10-27 ENCOUNTER — Other Ambulatory Visit: Payer: Self-pay

## 2018-10-27 VITALS — BP 124/74 | HR 74 | Temp 98.0°F | Ht 69.25 in | Wt 177.8 lb

## 2018-10-27 DIAGNOSIS — E785 Hyperlipidemia, unspecified: Secondary | ICD-10-CM | POA: Diagnosis not present

## 2018-10-27 DIAGNOSIS — F419 Anxiety disorder, unspecified: Secondary | ICD-10-CM

## 2018-10-27 DIAGNOSIS — Z8249 Family history of ischemic heart disease and other diseases of the circulatory system: Secondary | ICD-10-CM

## 2018-10-27 DIAGNOSIS — Z125 Encounter for screening for malignant neoplasm of prostate: Secondary | ICD-10-CM

## 2018-10-27 DIAGNOSIS — E538 Deficiency of other specified B group vitamins: Secondary | ICD-10-CM | POA: Diagnosis not present

## 2018-10-27 DIAGNOSIS — J302 Other seasonal allergic rhinitis: Secondary | ICD-10-CM

## 2018-10-27 DIAGNOSIS — Z6826 Body mass index (BMI) 26.0-26.9, adult: Secondary | ICD-10-CM

## 2018-10-27 DIAGNOSIS — Z23 Encounter for immunization: Secondary | ICD-10-CM

## 2018-10-27 DIAGNOSIS — Z0001 Encounter for general adult medical examination with abnormal findings: Secondary | ICD-10-CM

## 2018-10-27 DIAGNOSIS — Z8619 Personal history of other infectious and parasitic diseases: Secondary | ICD-10-CM

## 2018-10-27 DIAGNOSIS — C9201 Acute myeloblastic leukemia, in remission: Secondary | ICD-10-CM

## 2018-10-27 DIAGNOSIS — K219 Gastro-esophageal reflux disease without esophagitis: Secondary | ICD-10-CM

## 2018-10-27 DIAGNOSIS — I10 Essential (primary) hypertension: Secondary | ICD-10-CM

## 2018-10-27 DIAGNOSIS — F528 Other sexual dysfunction not due to a substance or known physiological condition: Secondary | ICD-10-CM

## 2018-10-27 LAB — LIPID PANEL
Cholesterol: 176 mg/dL (ref 0–200)
HDL: 61 mg/dL (ref >39.00)
LDL Cholesterol: 98 mg/dL (ref 0–99)
NONHDL: 114.84
Total CHOL/HDL Ratio: 3
Triglycerides: 84 mg/dL (ref 0.0–149.0)
VLDL: 16.8 mg/dL (ref 0.0–40.0)

## 2018-10-27 LAB — COMPREHENSIVE METABOLIC PANEL
ALBUMIN: 4.7 g/dL (ref 3.5–5.2)
ALT: 26 U/L (ref 0–53)
AST: 22 U/L (ref 0–37)
Alkaline Phosphatase: 46 U/L (ref 39–117)
BILIRUBIN TOTAL: 0.5 mg/dL (ref 0.2–1.2)
BUN: 21 mg/dL (ref 6–23)
CALCIUM: 9.4 mg/dL (ref 8.4–10.5)
CO2: 31 mEq/L (ref 19–32)
Chloride: 103 mEq/L (ref 96–112)
Creatinine, Ser: 1.13 mg/dL (ref 0.40–1.50)
GFR: 65.49 mL/min (ref >60.00)
Glucose, Bld: 121 mg/dL — ABNORMAL HIGH (ref 70–99)
Potassium: 4.4 mEq/L (ref 3.5–5.1)
Sodium: 141 mEq/L (ref 135–145)
Total Protein: 6.7 g/dL (ref 6.0–8.3)

## 2018-10-27 LAB — TSH: TSH: 2.07 u[IU]/mL (ref 0.35–4.50)

## 2018-10-27 LAB — CBC
HCT: 39.8 % (ref 39.0–52.0)
Hemoglobin: 13.6 g/dL (ref 13.0–17.0)
MCHC: 34.1 g/dL (ref 30.0–36.0)
MCV: 96.7 fl (ref 78.0–100.0)
Platelets: 243 10*3/uL (ref 150.0–400.0)
RBC: 4.12 Mil/uL — AB (ref 4.22–5.81)
RDW: 13.4 % (ref 11.5–15.5)
WBC: 2.9 10*3/uL — ABNORMAL LOW (ref 4.0–10.5)

## 2018-10-27 LAB — VITAMIN B12: Vitamin B-12: 410 pg/mL (ref 211–911)

## 2018-10-27 LAB — PSA: PSA: 0.74 ng/mL (ref 0.10–4.00)

## 2018-10-27 NOTE — Progress Notes (Signed)
Chief Complaint:  [name] is a 63 y.o. male who presents today for his annual comprehensive physical exam.    Assessment/Plan:  Seasonal allergies Continue management per allergy.   History of cold sores Continue Valtrex as needed for outbreaks.  GERD with hiatal hernia Stable.  Will decrease omeprazole to 20 mg once daily.  Essential hypertension, benign At goal.  Continue Norvasc 10 mg daily.  Check TSH, CBC, and C met.  ERECTILE DYSFUNCTION, NON-ORGANIC Stable.  Continue Viagra 100 mg as needed.  Dyslipidemia Check lipid panel.  Continue pravastatin 20 mg daily.  Anxiety Continue Zoloft 50 mg daily.  AML (acute myeloid leukemia) in remission [facility]) s/p BMT 2000 Continue management per oncology.  B12 deficiency Check B12.  BMI 26 Discussed lifestyle modifications.   Preventative Healthcare: Check lipid panel.  First Shingrix given. Will order cardiac calcium scan given family history of CAD.  Patient Counseling(The following topics were reviewed and/or handout was given):  -Nutrition: Stressed importance of moderation in sodium/caffeine intake, saturated fat and cholesterol, caloric balance, sufficient intake of fresh fruits, vegetables, and fiber.  -Stressed the importance of regular exercise.   -Substance Abuse: Discussed cessation/primary prevention of tobacco, alcohol, or other drug use; driving or other dangerous activities under the influence; availability of treatment for abuse.   -Injury prevention: Discussed safety belts, safety helmets, smoke detector, smoking near bedding or upholstery.   -Sexuality: Discussed sexually transmitted diseases, partner selection, use of condoms, avoidance of unintended pregnancy and contraceptive alternatives.   -Dental health: Discussed importance of regular tooth brushing, flossing, and dental visits.  -Health maintenance and immunizations reviewed. Please refer to Health maintenance section.  Return to care in 1  year for next preventative visit.     Subjective:  HPI:  He has no acute complaints today.   His stable, chronic medical conditions are outlined below:  # Essential Hypertension - On norvasc 67m daily and tolerating well - ROS: No reported chest pain or shortness of breath  # Dyslipidemia - On pravastatin 283mdaily and tolerating well - ROS: No reported myalgias  # Erectile Dysfunction - Uses sildenafil as needed.  Tolerating well.  #  Anxiety - On Zoloft 5033maily and tolerating well - ROS: No SI or HI  # History of cold sore - Uses Valtrex as needed for outbreaks.  % GERD with hiatal hernia - Follows with GI - On omeprazole 40 mg twice daily and tolerating well.  % Seasonal Allergies - Follows with allergy  % AML in Remission s/p bone marrow transplant 2000 - Follows with heme-onc at [place] eat a healthy, balanced diet.  Exercise: Sees trainer twice weekly.  30 minutes at a time.   Depression screen PHQ 2/9 09/23/2018  Decreased Interest 0  Down, Depressed, Hopeless 0  PHQ - 2 Score 0    Health Maintenance Due  Topic Date Due  . HIV Screening  07/31/1971     ROS: Per HPI, otherwise a complete review of systems was negative.   PMH:  The following were reviewed and entered/updated in epic: Past Medical History:  Diagnosis Date  . [id number]  . Anemia   . Arthritis   . Depression   . Diverticulosis of colon (without mention of hemorrhage)   . Diverticulosis of colon (without mention of hemorrhage) 04/21/2011  . Esophageal reflux   . Family hx colonic polyps   . Family hx colonic polyps 04/21/2011  .  Hiatal hernia   . Hiatal hernia 04/21/2011  . History of kidney stones   . Hyperlipemia   . Hypertension   . Psychosexual dysfunction with inhibited sexual excitement    Patient Active Problem List   Diagnosis Date Noted  . Seasonal allergies 09/23/2018  . Anxiety  09/23/2018  . History of cold sores 09/23/2018  . AML (acute myeloid leukemia) in remission [facility]) s/p BMT 2000 09/28/2014  . GERD with hiatal hernia 04/21/2011  . ERECTILE DYSFUNCTION, NON-ORGANIC 11/15/2009  . Essential hypertension, benign 06/01/2007  . Dyslipidemia 05/26/2007   Past Surgical History:  Procedure Laterality Date  . SHOULDER SURGERY     x 3     Family History  Problem Relation Age of Onset  . Hypertension Father   . Heart disease Father   . Prostate cancer Father        dx late 82s  . Cancer Father 55       prostate ca  . Celiac disease Mother   . Colon polyps Mother   . Breast cancer Mother   . Cancer Mother        Bladder  . Colon cancer Neg Hx   . Esophageal cancer Neg Hx   . Inflammatory bowel disease Neg Hx   . Liver disease Neg Hx   . Pancreatic cancer Neg Hx   . Rectal cancer Neg Hx   . Stomach cancer Neg Hx     Medications- reviewed and updated Current Outpatient Medications  Medication Sig Dispense Refill  . amLODipine (NORVASC) 10 MG tablet Take 1 tablet (10 mg total) by mouth daily. 90 tablet 3  . EPIPEN 2-PAK 0.3 MG/0.3ML DEVI Inject 0.3 mg into the muscle as needed.     . Nutritional Supplements (JUICE PLUS FIBRE PO) Take by mouth. 2 tabs twice daily    . omeprazole (PRILOSEC) 40 MG capsule Take 1 capsule (40 mg total) by mouth daily. (Patient taking differently: Take 40 mg by mouth 2 (two) times daily. ) 180 capsule 3  . pravastatin (PRAVACHOL) 20 MG tablet Take 1 tablet (20 mg total) by mouth daily. 90 tablet 3  . Saw Palmetto, Serenoa repens, (SAW PALMETTO PO) Take by mouth.    . sertraline (ZOLOFT) 50 MG tablet Take 1 tablet (50 mg total) by mouth daily. 90 tablet 3  . sildenafil (VIAGRA) 100 MG tablet Take 0.5 tablets (50 mg total) by mouth as needed for erectile dysfunction. (Patient taking differently: Take by mouth as needed for erectile dysfunction. ) 30 tablet 6  . UNABLE TO FIND 1 tablet 3 (three) times daily. Med Name: Turmeric  & Bromelain 1348m-150mg    . valACYclovir (VALTREX) 500 MG tablet Take 1 tablet (500 mg total) by mouth 2 (two) times daily. 1 tablet twice daily for 1 day for each  episode of herpes simplex 20 tablet 2   No current facility-administered medications for this visit.     Allergies-reviewed and updated Allergies  Allergen Reactions  . Morphine Other (See Comments)    UNKNOWN - REACTION IS NOT LISTED  . Marinol [Dronabinol]     Delusions.    Social History   Socioeconomic History  . Marital status: Married    Spouse name: Not on file  . Number of children: 2  . Years of education: Not on file  . Highest education level: Not on file  Occupational History  . Occupation: self employed    Employer: [employer] Social Needs  . Financial resource strain:  Not on file  . Food insecurity:    Worry: Not on file    Inability: Not on file  . Transportation needs:    Medical: Not on file    Non-medical: Not on file  Tobacco Use  . Smoking status: Never Smoker  . Smokeless tobacco: Never Used  Substance and Sexual Activity  . Alcohol use: Yes    Comment: one occ   . Drug use: No  . Sexual activity: Never  Lifestyle  . Physical activity:    Days per week: Not on file    Minutes per session: Not on file  . Stress: Not on file  Relationships  . Social connections:    Talks on phone: Not on file    Gets together: Not on file    Attends religious service: Not on file    Active member of club or organization: Not on file    Attends meetings of clubs or organizations: Not on file    Relationship status: Not on file  Other Topics Concern  . Not on file  Social History Narrative   2 caffeine drinks daily         Objective:  Physical Exam: BP 124/74 (BP Location: Left Arm, Patient Position: Sitting, Cuff Size: Normal)   Pulse 74   Temp 98 F (36.7 C) (Oral)   Ht 5' 9.25" (1.759 m)   Wt 177 lb 12 oz (80.6 kg)   SpO2 97%   BMI 26.06 kg/m   Body mass index is 26.06  kg/m. Wt Readings from Last 3 Encounters:  10/27/18 177 lb 12 oz (80.6 kg)  09/23/18 179 lb (81.2 kg)  08/31/18 180 lb (81.6 kg)   Gen: NAD, resting comfortably HEENT: TMs normal bilaterally. OP clear. No thyromegaly noted.  CV: RRR with no murmurs appreciated Pulm: NWOB, CTAB with no crackles, wheezes, or rhonchi GI: Normal bowel sounds present. Soft, Nontender, Nondistended. MSK: no edema, cyanosis, or clubbing noted Skin: warm, dry Neuro: CN2-12 grossly intact. Strength 5/5 in upper and lower extremities. Reflexes symmetric and intact bilaterally.  Psych: Normal affect and thought content     Michaila Kenney M. [name], MD 10/27/2018 9:26 AM

## 2018-10-27 NOTE — Assessment & Plan Note (Signed)
Continue Valtrex as needed for outbreaks.

## 2018-10-27 NOTE — Assessment & Plan Note (Signed)
Stable.  Will decrease omeprazole to 20 mg once daily.

## 2018-10-27 NOTE — Assessment & Plan Note (Signed)
Continue management per oncology. 

## 2018-10-27 NOTE — Patient Instructions (Signed)
It was very nice to see you today!  Keep up the good work!  We will check blood work today.   We will also order a cardiac CT scan.  Come back in 1 year for your next physical, or sooner as needed.   Take care, Dr [name]   Preventive Care 40-64 Years, Male Preventive care refers to lifestyle choices and visits with your health care provider that can promote health and wellness. What does preventive care include?   A yearly physical exam. This is also called an annual well check.  Dental exams once or twice a year.  Routine eye exams. Ask your health care provider how often you should have your eyes checked.  Personal lifestyle choices, including: ? Daily care of your teeth and gums. ? Regular physical activity. ? Eating a healthy diet. ? Avoiding tobacco and drug use. ? Limiting alcohol use. ? Practicing safe sex. ? Taking low-dose aspirin every day starting at age 52. What happens during an annual well check? The services and screenings done by your health care provider during your annual well check will depend on your age, overall health, lifestyle risk factors, and family history of disease. Counseling Your health care provider may ask you questions about your:  Alcohol use.  Tobacco use.  Drug use.  Emotional well-being.  Home and relationship well-being.  Sexual activity.  Eating habits.  Work and work Statistician. Screening You may have the following tests or measurements:  Height, weight, and BMI.  Blood pressure.  Lipid and cholesterol levels. These may be checked every 5 years, or more frequently if you are over 62 years old.  Skin check.  Lung cancer screening. You may have this screening every year starting at age 60 if you have a 30-pack-year history of smoking and currently smoke or have quit within the past 15 years.  Colorectal cancer screening. All adults should have this screening starting at age 79 and continuing until age 10. Your  health care provider may recommend screening at age 12. You will have tests every 1-10 years, depending on your results and the type of screening test. People at increased risk should start screening at an earlier age. Screening tests may include: ? Guaiac-based fecal occult blood testing. ? Fecal immunochemical test (FIT). ? Stool DNA test. ? Virtual colonoscopy. ? Sigmoidoscopy. During this test, a flexible tube with a tiny camera (sigmoidoscope) is used to examine your rectum and lower colon. The sigmoidoscope is inserted through your anus into your rectum and lower colon. ? Colonoscopy. During this test, a long, thin, flexible tube with a tiny camera (colonoscope) is used to examine your entire colon and rectum.  Prostate cancer screening. Recommendations will vary depending on your family history and other risks.  Hepatitis C blood test.  Hepatitis B blood test.  Sexually transmitted disease (STD) testing.  Diabetes screening. This is done by checking your blood sugar (glucose) after you have not eaten for a while (fasting). You may have this done every 1-3 years. Discuss your test results, treatment options, and if necessary, the need for more tests with your health care provider. Vaccines Your health care provider may recommend certain vaccines, such as:  Influenza vaccine. This is recommended every year.  Tetanus, diphtheria, and acellular pertussis (Tdap, Td) vaccine. You may need a Td booster every 10 years.  Varicella vaccine. You may need this if you have not been vaccinated.  Zoster vaccine. You may need this after age 77.  Measles, mumps,  and rubella (MMR) vaccine. You may need at least one dose of MMR if you were born in 1957 or later. You may also need a second dose.  Pneumococcal 13-valent conjugate (PCV13) vaccine. You may need this if you have certain conditions and have not been vaccinated.  Pneumococcal polysaccharide (PPSV23) vaccine. You may need one or two  doses if you smoke cigarettes or if you have certain conditions.  Meningococcal vaccine. You may need this if you have certain conditions.  Hepatitis A vaccine. You may need this if you have certain conditions or if you travel or work in places where you may be exposed to hepatitis A.  Hepatitis B vaccine. You may need this if you have certain conditions or if you travel or work in places where you may be exposed to hepatitis B.  Haemophilus influenzae type b (Hib) vaccine. You may need this if you have certain risk factors. Talk to your health care provider about which screenings and vaccines you need and how often you need them. This information is not intended to replace advice given to you by your health care provider. Make sure you discuss any questions you have with your health care provider. Document Released: 08/24/2015 Document Revised: 09/17/2017 Document Reviewed: 05/29/2015 Elsevier Interactive Patient Education  2019 [employer].

## 2018-10-27 NOTE — Assessment & Plan Note (Signed)
Check lipid panel.  Continue pravastatin 20 mg daily. 

## 2018-10-27 NOTE — Assessment & Plan Note (Signed)
Continue management per allergy. 

## 2018-10-27 NOTE — Assessment & Plan Note (Signed)
At goal.  Continue Norvasc 10 mg daily.  Check TSH, CBC, and C met.

## 2018-10-27 NOTE — Assessment & Plan Note (Signed)
Stable.  Continue Viagra 100 mg as needed.

## 2018-10-27 NOTE — Assessment & Plan Note (Signed)
Continue Zoloft 50 mg daily 

## 2018-10-28 NOTE — Progress Notes (Signed)
Please inform patient of the following:  Cholesterol levels are ok, but should be a bit lower to reduce his risk of heart attack and stroke. Recommend increasing intensity of statin to lipitor 40mg  daily. Please send this in for patient. We can recheck again in 1 year.  His blood sugar is also a bit higher than in years past as well. Recommend starting metformin XR 750mg  daily to improve numbers and reduce risk of heart attack and stroke.Please send in for patient. We should recheck in 3-6 months.  All of his other blood work is NORMAL.  [name]. [name], MD 10/28/2018 4:36 PM

## 2018-10-29 ENCOUNTER — Other Ambulatory Visit: Payer: Self-pay

## 2018-10-29 MED ORDER — METFORMIN HCL ER 750 MG PO TB24: ORAL_TABLET | ORAL | 2 refills | Status: DC

## 2018-10-29 NOTE — Telephone Encounter (Signed)
See note  Copied from [place] [phone]. Topic: Quick Communication - Lab Results (Clinic Use ONLY) >> Oct 29, 2018  9:54 AM Scherrie Gerlach wrote: Pt states he just talked with Dr Marigene Ehlers asst and given lab results.  However he states he was given the wrong numbers, the numbers he got were from 2 yrs ago.  He would like a call back to make sure the change he is supposed to have in his med, is correct, because the other numbers are not.

## 2018-11-01 ENCOUNTER — Other Ambulatory Visit: Payer: Self-pay

## 2018-11-01 DIAGNOSIS — R739 Hyperglycemia, unspecified: Secondary | ICD-10-CM

## 2018-11-02 ENCOUNTER — Other Ambulatory Visit: Payer: Self-pay

## 2018-11-02 ENCOUNTER — Other Ambulatory Visit (INDEPENDENT_AMBULATORY_CARE_PROVIDER_SITE_OTHER): Payer: BLUE CROSS/BLUE SHIELD

## 2018-11-02 DIAGNOSIS — J301 Allergic rhinitis due to pollen: Secondary | ICD-10-CM | POA: Diagnosis not present

## 2018-11-02 DIAGNOSIS — R739 Hyperglycemia, unspecified: Secondary | ICD-10-CM

## 2018-11-02 LAB — HEMOGLOBIN A1C: Hgb A1c MFr Bld: 6.2 % (ref 4.6–6.5)

## 2018-11-02 NOTE — Progress Notes (Signed)
Please inform patient of the following:  A1c is still in the prediabetic range at 6.2. Still recommend starting metformin XR 750mg  daily to reduce risk of progressing to diabetes and also reduce risk of heart attack and stroke.  Can schedule a visit to discuss further if he wishes.   Algis Greenhouse. [name], MD 11/02/2018 12:11 PM

## 2018-11-05 ENCOUNTER — Encounter: Payer: Self-pay | Admitting: Family Medicine

## 2018-11-08 ENCOUNTER — Other Ambulatory Visit: Payer: Self-pay

## 2018-11-29 ENCOUNTER — Inpatient Hospital Stay: Admission: RE | Admit: 2018-11-29 | Payer: BLUE CROSS/BLUE SHIELD | Source: Ambulatory Visit

## 2018-12-16 DIAGNOSIS — J301 Allergic rhinitis due to pollen: Secondary | ICD-10-CM | POA: Diagnosis not present

## 2018-12-23 DIAGNOSIS — J301 Allergic rhinitis due to pollen: Secondary | ICD-10-CM | POA: Diagnosis not present

## 2018-12-28 DIAGNOSIS — J301 Allergic rhinitis due to pollen: Secondary | ICD-10-CM | POA: Diagnosis not present

## 2018-12-29 ENCOUNTER — Encounter: Payer: Self-pay | Admitting: Family Medicine

## 2018-12-29 DIAGNOSIS — M25552 Pain in left hip: Secondary | ICD-10-CM | POA: Diagnosis not present

## 2018-12-29 DIAGNOSIS — M545 Low back pain: Secondary | ICD-10-CM | POA: Diagnosis not present

## 2018-12-29 DIAGNOSIS — M25522 Pain in left elbow: Secondary | ICD-10-CM | POA: Diagnosis not present

## 2018-12-30 ENCOUNTER — Other Ambulatory Visit: Payer: Self-pay

## 2018-12-30 DIAGNOSIS — R7303 Prediabetes: Secondary | ICD-10-CM

## 2018-12-31 DIAGNOSIS — J301 Allergic rhinitis due to pollen: Secondary | ICD-10-CM | POA: Diagnosis not present

## 2019-01-13 DIAGNOSIS — D225 Melanocytic nevi of trunk: Secondary | ICD-10-CM | POA: Diagnosis not present

## 2019-01-13 DIAGNOSIS — Z85828 Personal history of other malignant neoplasm of skin: Secondary | ICD-10-CM | POA: Diagnosis not present

## 2019-01-13 DIAGNOSIS — L821 Other seborrheic keratosis: Secondary | ICD-10-CM | POA: Diagnosis not present

## 2019-01-14 ENCOUNTER — Encounter: Payer: Self-pay | Admitting: Family Medicine

## 2019-01-14 ENCOUNTER — Ambulatory Visit (INDEPENDENT_AMBULATORY_CARE_PROVIDER_SITE_OTHER): Payer: BC Managed Care – PPO | Admitting: Family Medicine

## 2019-01-14 VITALS — Ht 70.0 in | Wt 172.0 lb

## 2019-01-14 DIAGNOSIS — M47812 Spondylosis without myelopathy or radiculopathy, cervical region: Secondary | ICD-10-CM | POA: Diagnosis not present

## 2019-01-14 DIAGNOSIS — M62838 Other muscle spasm: Secondary | ICD-10-CM | POA: Diagnosis not present

## 2019-01-14 MED ORDER — CYCLOBENZAPRINE HCL 10 MG PO TABS: 10.0000 mg | ORAL_TABLET | Freq: Three times a day (TID) | ORAL | 0 refills | Status: DC | PRN

## 2019-01-14 NOTE — Progress Notes (Signed)
Virtual Visit via Video Note  Subjective  CC:  Chief Complaint  Patient presents with  . Neck Pain    Started this morning.[name] He has tried Heat compressees and alternated Tylenol and Advil.[name] He states that he has a stiff neck for a few months, had a massage that help but this morning pain was bad     I connected with [name] on 01/14/19 at  3:00 PM EDT by a video enabled telemedicine application and verified that I am speaking with the correct person using two identifiers. Location patient: Home Location provider: [facility] [place], Office Persons participating in the virtual visit: [name], Melvin Arnt, MD Lilli Light, [place] Same day acute visit; PCP not available. New pt to me. Chart reviewed.   I discussed the limitations of evaluation and management by telemedicine and the availability of in person appointments. The patient expressed understanding and agreed to proceed. HPI: [name] is a 63 y.o. male who was contacted today to address the problems listed above in the chief complaint. . Pleasant 63 yo male awoke with acute pain in right neck: hurts to turn head. Like a "crick in the neck". No injury. Does have facet joint DJD by review of cervical spine CT. He hasn't taken anything for pain; has pain if turns head too far to right or when lying on pillow. Feels fine if sitting still. No radicular sxs. Has had in past and flexeril helps at night. He is out. Currently in [place] for the weekend.   Assessment  1. Neck muscle spasm   2. Facet arthritis, degenerative, cervical spine      Plan   djd and muscle spasm:  Education given. Heat, advil tid and flexeril qhs prn. F/u if worsens. Discussed red flags in detail.  I discussed the assessment and treatment plan with the patient. The patient was provided an opportunity to ask questions and all were answered. The patient agreed with the plan and demonstrated an understanding of the  instructions.   The patient was advised to call back or seek an in-person evaluation if the symptoms worsen or if the condition fails to improve as anticipated. Follow up: Return if symptoms worsen or fail to improve.  Visit date not found  Meds ordered this encounter  Medications  . cyclobenzaprine (FLEXERIL) 10 MG tablet    Sig: Take 1 tablet (10 mg total) by mouth 3 (three) times daily as needed for muscle spasms.    Dispense:  30 tablet    Refill:  0      I reviewed the patients updated PMH, FH, and SocHx.    Patient Active Problem List   Diagnosis Date Noted  . Seasonal allergies 09/23/2018  . Anxiety 09/23/2018  . History of cold sores 09/23/2018  . AML (acute myeloid leukemia) in remission [facility]) s/p BMT 2000 09/28/2014  . GERD with hiatal hernia 04/21/2011  . ERECTILE DYSFUNCTION, NON-ORGANIC 11/15/2009  . Essential hypertension, benign 06/01/2007  . Dyslipidemia 05/26/2007   Current Meds  Medication Sig  . amLODipine (NORVASC) 10 MG tablet Take 1 tablet (10 mg total) by mouth daily.  [name] EPIPEN 2-PAK 0.3 MG/0.3ML DEVI Inject 0.3 mg into the muscle as needed.   . metFORMIN (GLUCOPHAGE-XR) 750 MG 24 hr tablet Take one tablet daily with breakfast  . Nutritional Supplements (JUICE PLUS FIBRE PO) Take by mouth. 2 tabs twice daily  . omeprazole (PRILOSEC) 40 MG capsule  Take 1 capsule (40 mg total) by mouth daily. (Patient taking differently: Take 40 mg by mouth 2 (two) times daily. )  . pravastatin (PRAVACHOL) 20 MG tablet Take 1 tablet (20 mg total) by mouth daily.  . Saw Palmetto, Serenoa repens, (SAW PALMETTO PO) Take by mouth.  . sertraline (ZOLOFT) 50 MG tablet Take 1 tablet (50 mg total) by mouth daily.  . sildenafil (VIAGRA) 100 MG tablet Take 0.5 tablets (50 mg total) by mouth as needed for erectile dysfunction. (Patient taking differently: Take by mouth as needed for erectile dysfunction. )  . UNABLE TO FIND 1 tablet 3 (three) times daily. Med Name: Turmeric &  Bromelain 1300mg -150mg   . valACYclovir (VALTREX) 500 MG tablet Take 1 tablet (500 mg total) by mouth 2 (two) times daily. 1 tablet twice daily for 1 day for each  episode of herpes simplex    Allergies: Patient is allergic to morphine and marinol [dronabinol]. Family History: Patient family history includes Breast cancer in his mother; Cancer in his mother; Cancer (age of onset: 3) in his father; Celiac disease in his mother; Colon polyps in his mother; Heart disease in his father; Hypertension in his father; Prostate cancer in his father. Social History:  Patient  reports that he has never smoked. He has never used smokeless tobacco. He reports current alcohol use. He reports that he does not use drugs.  Review of Systems: Constitutional: Negative for fever malaise or anorexia Cardiovascular: negative for chest pain Respiratory: negative for SOB or persistent cough Gastrointestinal: negative for abdominal pain  OBJECTIVE Vitals: Ht 5\' 10"  (1.778 m)   Wt 172 lb (78 kg)   BMI 24.68 kg/m  General: no acute distress , A&Ox3, appears comfortable in seated position Neck: limited rotation due to pain. Nl flexion and extension Can raise arms above head w/o pain.   Melvin Arnt, MD

## 2019-01-19 DIAGNOSIS — J301 Allergic rhinitis due to pollen: Secondary | ICD-10-CM | POA: Diagnosis not present

## 2019-01-20 DIAGNOSIS — M7702 Medial epicondylitis, left elbow: Secondary | ICD-10-CM | POA: Diagnosis not present

## 2019-01-24 ENCOUNTER — Telehealth: Payer: Self-pay | Admitting: *Deleted

## 2019-01-24 NOTE — Telephone Encounter (Signed)
Script Screening patients for COVID-19 and reviewing new operational procedures  Greeting - The reason I am calling is to share with you some new changes to our processes that are designed to help [place] keep everyone safe. Is now a good time to speak with you? Patient says "no' - ask them when you can call back and let them know it's important to do this prior to their appointment.  Patient says "yes" - Doristine Devoid, Marden Noble the first thing I need to do is ask you some screening Questions.  1. To the best of your knowledge, have you been in close contact with any one with a confirmed diagnosis of COVID 19? o No - proceed to next question  2. Have you had any one or more of the following: fever, chills, cough, shortness of breath or any flu-like symptoms? o No - proceed to next question  3. Have you been diagnosed with or have a previous diagnosis of COVID 19? o No - proceed to next question  4. I am going to go over a few other symptoms with you. Please let me know if you are experiencing any of the following: . Ear, nose or throat discomfort . A sore throat . Headache . Muscle pain . Diarrhea . Loss of taste or smell o No - proceed to next question  Thank you for answering these questions. Please know we will ask you these questions or similar questions when you arrive for your appointment and again it's how we are keeping everyone safe. Also, to keep you safe, please use the provided hand sanitizer when you enter the building. Doug, we are asking everyone in the building to wear a mask because they help [place] prevent the spread of germs. Do you have a mask of your own, if not, we are happy to provide one for you. The last thing I want to go over with you is the no visitor guidelines. This means no one can attend the appointment with you unless you need physical assistance. I understand this may be different from your past appointments and I know this may be difficult but please know if  someone is driving you we are happy to call them for you once your appointment is over.  [INSERT SITE SPECIFIC CHECK IN [place] Patient aware of cost for CT CA Score.   Ulis Rias given you a lot of information, what questions do you have about what I've talked about today or your appointment tomorrow?

## 2019-01-25 ENCOUNTER — Other Ambulatory Visit: Payer: Self-pay | Admitting: Family Medicine

## 2019-01-25 ENCOUNTER — Other Ambulatory Visit: Payer: Self-pay

## 2019-01-25 ENCOUNTER — Ambulatory Visit (INDEPENDENT_AMBULATORY_CARE_PROVIDER_SITE_OTHER)
Admission: RE | Admit: 2019-01-25 | Discharge: 2019-01-25 | Disposition: A | Discharge status: Home / Self Care | Payer: Self-pay | Source: Ambulatory Visit | Attending: Family Medicine | Admitting: Family Medicine

## 2019-01-25 DIAGNOSIS — Z8249 Family history of ischemic heart disease and other diseases of the circulatory system: Secondary | ICD-10-CM

## 2019-01-27 NOTE — Progress Notes (Signed)
Please inform patient of the following:  His coronary calcium score was at the 56 percentile for his age.  Recommend increasing intensity of statin.  Please send in atorvastatin 40 mg daily.  We can recheck his cholesterol levels in 6 to 12 months.

## 2019-01-28 ENCOUNTER — Encounter: Payer: Self-pay | Admitting: Family Medicine

## 2019-01-28 ENCOUNTER — Other Ambulatory Visit: Payer: Self-pay

## 2019-01-28 DIAGNOSIS — Z8249 Family history of ischemic heart disease and other diseases of the circulatory system: Secondary | ICD-10-CM

## 2019-01-28 DIAGNOSIS — I10 Essential (primary) hypertension: Secondary | ICD-10-CM

## 2019-01-28 MED ORDER — ATORVASTATIN CALCIUM 40 MG PO TABS: 40.0000 mg | ORAL_TABLET | Freq: Every day | ORAL | 3 refills | Status: DC

## 2019-01-28 NOTE — Progress Notes (Signed)
[place] with referral.  Algis Greenhouse. [name], MD 01/28/2019 11:44 AM

## 2019-02-10 DIAGNOSIS — J301 Allergic rhinitis due to pollen: Secondary | ICD-10-CM | POA: Diagnosis not present

## 2019-02-14 ENCOUNTER — Other Ambulatory Visit: Payer: Self-pay

## 2019-02-14 MED ORDER — METFORMIN HCL ER 750 MG PO TB24: ORAL_TABLET | ORAL | 3 refills | Status: DC

## 2019-02-17 DIAGNOSIS — J301 Allergic rhinitis due to pollen: Secondary | ICD-10-CM | POA: Diagnosis not present

## 2019-02-22 DIAGNOSIS — Z79899 Other long term (current) drug therapy: Secondary | ICD-10-CM | POA: Diagnosis not present

## 2019-02-22 DIAGNOSIS — Z85828 Personal history of other malignant neoplasm of skin: Secondary | ICD-10-CM | POA: Diagnosis not present

## 2019-02-22 DIAGNOSIS — E538 Deficiency of other specified B group vitamins: Secondary | ICD-10-CM | POA: Diagnosis not present

## 2019-02-22 DIAGNOSIS — Z9889 Other specified postprocedural states: Secondary | ICD-10-CM | POA: Diagnosis not present

## 2019-02-22 DIAGNOSIS — F329 Major depressive disorder, single episode, unspecified: Secondary | ICD-10-CM | POA: Diagnosis not present

## 2019-02-22 DIAGNOSIS — I10 Essential (primary) hypertension: Secondary | ICD-10-CM | POA: Diagnosis not present

## 2019-02-22 DIAGNOSIS — J302 Other seasonal allergic rhinitis: Secondary | ICD-10-CM | POA: Diagnosis not present

## 2019-02-22 DIAGNOSIS — K219 Gastro-esophageal reflux disease without esophagitis: Secondary | ICD-10-CM | POA: Diagnosis not present

## 2019-02-22 DIAGNOSIS — C9201 Acute myeloblastic leukemia, in remission: Secondary | ICD-10-CM | POA: Diagnosis not present

## 2019-02-22 DIAGNOSIS — E785 Hyperlipidemia, unspecified: Secondary | ICD-10-CM | POA: Diagnosis not present

## 2019-02-22 LAB — BASIC METABOLIC PANEL
BUN: 23 — AB (ref 4–21)
Creatinine: 1.2 (ref 0.6–1.3)
Glucose: 80
Sodium: 140 (ref 137–147)

## 2019-02-22 LAB — HEPATIC FUNCTION PANEL: Bilirubin, Total: 0.6

## 2019-02-22 LAB — CBC AND DIFFERENTIAL: WBC: 2.6

## 2019-03-07 ENCOUNTER — Encounter: Payer: Self-pay | Admitting: Family Medicine

## 2019-03-07 ENCOUNTER — Other Ambulatory Visit: Payer: Self-pay

## 2019-03-07 DIAGNOSIS — C9201 Acute myeloblastic leukemia, in remission: Secondary | ICD-10-CM

## 2019-03-09 ENCOUNTER — Other Ambulatory Visit: Payer: BC Managed Care – PPO

## 2019-03-10 ENCOUNTER — Other Ambulatory Visit: Payer: Self-pay

## 2019-03-10 ENCOUNTER — Other Ambulatory Visit (INDEPENDENT_AMBULATORY_CARE_PROVIDER_SITE_OTHER): Payer: BC Managed Care – PPO

## 2019-03-10 DIAGNOSIS — C9201 Acute myeloblastic leukemia, in remission: Secondary | ICD-10-CM

## 2019-03-10 DIAGNOSIS — R7303 Prediabetes: Secondary | ICD-10-CM | POA: Diagnosis not present

## 2019-03-10 LAB — COMPREHENSIVE METABOLIC PANEL
ALT: 22 U/L (ref 0–53)
AST: 20 U/L (ref 0–37)
Albumin: 4.6 g/dL (ref 3.5–5.2)
Alkaline Phosphatase: 53 U/L (ref 39–117)
BUN: 26 mg/dL — ABNORMAL HIGH (ref 6–23)
CO2: 28 mEq/L (ref 19–32)
Calcium: 9.4 mg/dL (ref 8.4–10.5)
Chloride: 108 mEq/L (ref 96–112)
Creatinine, Ser: 1.21 mg/dL (ref 0.40–1.50)
GFR: 60.45 mL/min (ref >60.00)
Glucose, Bld: 99 mg/dL (ref 70–99)
Potassium: 4 mEq/L (ref 3.5–5.1)
Sodium: 144 mEq/L (ref 135–145)
Total Bilirubin: 0.5 mg/dL (ref 0.2–1.2)
Total Protein: 6.2 g/dL (ref 6.0–8.3)

## 2019-03-10 LAB — CBC
HCT: 34.5 % — ABNORMAL LOW (ref 39.0–52.0)
Hemoglobin: 11.8 g/dL — ABNORMAL LOW (ref 13.0–17.0)
MCHC: 34 g/dL (ref 30.0–36.0)
MCV: 104 fl — ABNORMAL HIGH (ref 78.0–100.0)
Platelets: 208 10*3/uL (ref 150.0–400.0)
RBC: 3.32 Mil/uL — ABNORMAL LOW (ref 4.22–5.81)
RDW: 13.3 % (ref 11.5–15.5)
WBC: 2.4 10*3/uL — ABNORMAL LOW (ref 4.0–10.5)

## 2019-03-10 LAB — HEMOGLOBIN A1C: Hgb A1c MFr Bld: 5.8 % (ref 4.6–6.5)

## 2019-03-10 NOTE — Progress Notes (Signed)
Please inform patient of the following:  Blood sugar levels are improving - he should continue with the metformin.   His WBC are stable but his hemoglobin has dropped a bit - recommend that he follow up with hemeonc. Probably should have it rechecked again in a few months.  [name]. [name], MD 03/10/2019 4:52 PM

## 2019-03-15 DIAGNOSIS — Z7984 Long term (current) use of oral hypoglycemic drugs: Secondary | ICD-10-CM | POA: Diagnosis not present

## 2019-03-15 DIAGNOSIS — R21 Rash and other nonspecific skin eruption: Secondary | ICD-10-CM | POA: Diagnosis not present

## 2019-03-15 DIAGNOSIS — C9201 Acute myeloblastic leukemia, in remission: Secondary | ICD-10-CM | POA: Diagnosis not present

## 2019-03-15 DIAGNOSIS — Z9484 Stem cells transplant status: Secondary | ICD-10-CM | POA: Diagnosis not present

## 2019-03-15 DIAGNOSIS — I10 Essential (primary) hypertension: Secondary | ICD-10-CM | POA: Diagnosis not present

## 2019-03-15 DIAGNOSIS — D709 Neutropenia, unspecified: Secondary | ICD-10-CM | POA: Diagnosis not present

## 2019-03-15 DIAGNOSIS — E119 Type 2 diabetes mellitus without complications: Secondary | ICD-10-CM | POA: Diagnosis not present

## 2019-03-15 DIAGNOSIS — F329 Major depressive disorder, single episode, unspecified: Secondary | ICD-10-CM | POA: Diagnosis not present

## 2019-03-15 DIAGNOSIS — E785 Hyperlipidemia, unspecified: Secondary | ICD-10-CM | POA: Diagnosis not present

## 2019-03-15 LAB — HEPATIC FUNCTION PANEL
ALT: 28 (ref 10–40)
AST: 21 (ref 14–40)
Alkaline Phosphatase: 43 (ref 25–125)
Bilirubin, Total: 0.9

## 2019-03-15 LAB — CBC AND DIFFERENTIAL
HCT: 37 — AB (ref 41–53)
Hemoglobin: 12.7 — AB (ref 13.5–17.5)
Neutrophils Absolute: 1
Platelets: 216 (ref 150–399)
WBC: 2.4

## 2019-03-15 LAB — BASIC METABOLIC PANEL
BUN: 20 (ref 4–21)
Creatinine: 1 (ref 0.6–1.3)
Glucose: 130
Potassium: 4.1 (ref 3.4–5.3)
Sodium: 137 (ref 137–147)

## 2019-03-17 DIAGNOSIS — J301 Allergic rhinitis due to pollen: Secondary | ICD-10-CM | POA: Diagnosis not present

## 2019-03-18 DIAGNOSIS — C9201 Acute myeloblastic leukemia, in remission: Secondary | ICD-10-CM | POA: Diagnosis not present

## 2019-03-18 DIAGNOSIS — D709 Neutropenia, unspecified: Secondary | ICD-10-CM | POA: Diagnosis not present

## 2019-03-21 ENCOUNTER — Encounter: Payer: Self-pay | Admitting: Family Medicine

## 2019-03-21 DIAGNOSIS — J301 Allergic rhinitis due to pollen: Secondary | ICD-10-CM | POA: Diagnosis not present

## 2019-03-24 ENCOUNTER — Encounter: Payer: Self-pay | Admitting: Family Medicine

## 2019-03-24 DIAGNOSIS — J301 Allergic rhinitis due to pollen: Secondary | ICD-10-CM | POA: Diagnosis not present

## 2019-03-29 DIAGNOSIS — C9201 Acute myeloblastic leukemia, in remission: Secondary | ICD-10-CM | POA: Diagnosis not present

## 2019-03-29 DIAGNOSIS — D709 Neutropenia, unspecified: Secondary | ICD-10-CM | POA: Diagnosis not present

## 2019-03-30 DIAGNOSIS — J301 Allergic rhinitis due to pollen: Secondary | ICD-10-CM | POA: Diagnosis not present

## 2019-03-31 DIAGNOSIS — Z9481 Bone marrow transplant status: Secondary | ICD-10-CM | POA: Diagnosis not present

## 2019-03-31 DIAGNOSIS — D469 Myelodysplastic syndrome, unspecified: Secondary | ICD-10-CM | POA: Diagnosis not present

## 2019-03-31 DIAGNOSIS — C9201 Acute myeloblastic leukemia, in remission: Secondary | ICD-10-CM | POA: Diagnosis not present

## 2019-03-31 DIAGNOSIS — D72819 Decreased white blood cell count, unspecified: Secondary | ICD-10-CM | POA: Diagnosis not present

## 2019-04-04 DIAGNOSIS — Z005 Encounter for examination of potential donor of organ and tissue: Secondary | ICD-10-CM | POA: Diagnosis not present

## 2019-04-05 ENCOUNTER — Other Ambulatory Visit (INDEPENDENT_AMBULATORY_CARE_PROVIDER_SITE_OTHER): Payer: BC Managed Care – PPO

## 2019-04-05 ENCOUNTER — Ambulatory Visit (INDEPENDENT_AMBULATORY_CARE_PROVIDER_SITE_OTHER): Payer: BC Managed Care – PPO | Admitting: Gastroenterology

## 2019-04-05 ENCOUNTER — Encounter: Payer: Self-pay | Admitting: Gastroenterology

## 2019-04-05 VITALS — BP 120/80 | HR 72 | Temp 97.8°F | Ht 69.25 in | Wt 176.0 lb

## 2019-04-05 DIAGNOSIS — K449 Diaphragmatic hernia without obstruction or gangrene: Secondary | ICD-10-CM | POA: Diagnosis not present

## 2019-04-05 DIAGNOSIS — D649 Anemia, unspecified: Secondary | ICD-10-CM | POA: Diagnosis not present

## 2019-04-05 DIAGNOSIS — K219 Gastro-esophageal reflux disease without esophagitis: Secondary | ICD-10-CM

## 2019-04-05 DIAGNOSIS — J301 Allergic rhinitis due to pollen: Secondary | ICD-10-CM | POA: Diagnosis not present

## 2019-04-05 DIAGNOSIS — D469 Myelodysplastic syndrome, unspecified: Secondary | ICD-10-CM

## 2019-04-05 LAB — IBC + FERRITIN
Ferritin: 956.3 ng/mL — ABNORMAL HIGH (ref 22.0–322.0)
Iron: 124 ug/dL (ref 42–165)
Saturation Ratios: 41.2 % (ref 20.0–50.0)
Transferrin: 215 mg/dL (ref 212.0–360.0)

## 2019-04-05 LAB — FOLATE: Folate: 14.6 ng/mL (ref >5.9)

## 2019-04-05 MED ORDER — OMEPRAZOLE 40 MG PO CPDR: 40.0000 mg | DELAYED_RELEASE_CAPSULE | Freq: Every day | ORAL | 4 refills | Status: DC

## 2019-04-05 NOTE — Patient Instructions (Signed)
If you are age 64 or younger, your body mass index should be between 19-25. Your Body mass index is 25.8 kg/m. If this is out of the aformentioned range listed, please consider follow up with your Primary Care Provider.   Your provider has requested that you go to the basement level for lab work before leaving today. Press "B" on the elevator. The lab is located at the first door on the left as you exit the elevator.  We have printed the following medications for you to take to your pharmacy at your convenience: Omeprazole  Thank you for choosing me and [facility].  Dr. Rush Landmark

## 2019-04-05 NOTE — Progress Notes (Signed)
[facility] VISIT   Primary Care Provider [name], [address] [address] [phone]  Patient Profile: [name] is a 63 y.o. male with a pmh significant for s/p Auto-SCT for AML but now with developing MDS with likely need for Allo-SCT, Diverticulosis, MDD/Anxiety, Arthritis, HTN, HLD, Nephrolithiasis, Hiatal Hernia, GERD.  The patient presents to the [facility] for an evaluation and management of problem(s) noted below:  Problem List 1. Gastroesophageal reflux disease without esophagitis   2. Anemia, unspecified type   3. Myelodysplasia (myelodysplastic syndrome) ([place])     History of he wants to postpone any procedures or major procedures if possible illness: Please see prior consultation note and progress note for full details of HPI.   Interval History Overall, the patient has done well on his current medication regimen.  He does have periodic issues with heartburn and still has some very minimal dysphagia.  However weight has been stable.  Unfortunately, the patient has now been diagnosed with myelodysplasia and will need to undergo most likely an auto stem cell transplant for attempted treatment.  He is researching between [facility] as to where he would end up getting his treatments at this time.  Review of his records suggest that he has anemia which would go along with his myelodysplasia however cannot see certain laboratories for anemia work-up having been completed as of yet.  He has had a bit more fatigue and as his counts have come down slightly over the course of the coming months this led to the diagnosis.  GI Review of Systems Positive as above Negative for odynophagia, abdominal pain, change in bowel habits, nausea, vomiting  Review of Systems General: Denies fevers/chills HEENT: Throat clearing persists though slightly better; denies oral lesions/waterbrash Cardiovascular: Denies  chest pain/palpitations Pulmonary: Denies shortness of breath Gastroenterological: See HPI Genitourinary: Denies darkened urine Hematological: Denies easy bruising/bleeding Dermatological: Denies jaundice Psychological: Mood is stable   Medications Current Outpatient Medications  Medication Sig Dispense Refill  . amLODipine (NORVASC) 10 MG tablet Take 1 tablet (10 mg total) by mouth daily. 90 tablet 3  . atorvastatin (LIPITOR) 40 MG tablet Take 1 tablet (40 mg total) by mouth daily. 90 tablet 3  . cyclobenzaprine (FLEXERIL) 10 MG tablet Take 1 tablet (10 mg total) by mouth 3 (three) times daily as needed for muscle spasms. 30 tablet 0  . Nutritional Supplements (JUICE PLUS FIBRE PO) Take by mouth. 2 tabs twice daily    . omeprazole (PRILOSEC) 40 MG capsule Take 1 capsule (40 mg total) by mouth daily. 90 capsule 4  . sertraline (ZOLOFT) 50 MG tablet Take 1 tablet (50 mg total) by mouth daily. 90 tablet 3  . sildenafil (VIAGRA) 100 MG tablet Take 0.5 tablets (50 mg total) by mouth as needed for erectile dysfunction. (Patient taking differently: Take by mouth as needed for erectile dysfunction. ) 30 tablet 6  . UNABLE TO FIND 1 tablet 3 (three) times daily. Med Name: Turmeric & Bromelain 1300mg -150mg     . valACYclovir (VALTREX) 500 MG tablet Take 1 tablet (500 mg total) by mouth 2 (two) times daily. 1 tablet twice daily for 1 day for each  episode of herpes simplex 20 tablet 2  . EPIPEN 2-PAK 0.3 MG/0.3ML DEVI Inject 0.3 mg into the muscle as needed.      No current facility-administered medications for this visit.     Allergies Allergies  Allergen Reactions  . Morphine Other (See Comments)  UNKNOWN - REACTION IS NOT LISTED  . Marinol [Dronabinol]     Delusions.    Histories Past Medical History:  Diagnosis Date  . Acute myeloid leukemia in remission ([place]) 1999  . Anemia   . Arthritis   . Depression   . Diverticulosis of colon (without mention of hemorrhage)   .  Diverticulosis of colon (without mention of hemorrhage) 04/21/2011  . Esophageal reflux   . Family hx colonic polyps   . Family hx colonic polyps 04/21/2011  . Hiatal hernia   . Hiatal hernia 04/21/2011  . History of kidney stones   . Hyperlipemia   . Hypertension   . MDS (myelodysplastic syndrome) ([place])   . Psychosexual dysfunction with inhibited sexual excitement    Past Surgical History:  Procedure Laterality Date  . SHOULDER SURGERY     x 3    Social History   Socioeconomic History  . Marital status: Married    Spouse name: Not on file  . Number of children: 2  . Years of education: Not on file  . Highest education level: Not on file  Occupational History  . Occupation: self employed    Employer: [employer]  Social Needs  . Financial resource strain: Not on file  . Food insecurity    Worry: Not on file    Inability: Not on file  . Transportation needs    Medical: Not on file    Non-medical: Not on file  Tobacco Use  . Smoking status: Never Smoker  . Smokeless tobacco: Never Used  Substance and Sexual Activity  . Alcohol use: Yes    Comment: one occ   . Drug use: No  . Sexual activity: Never  Lifestyle  . Physical activity    Days per week: Not on file    Minutes per session: Not on file  . Stress: Not on file  Relationships  . Social Herbalist on phone: Not on file    Gets together: Not on file    Attends religious service: Not on file    Active member of club or organization: Not on file    Attends meetings of clubs or organizations: Not on file    Relationship status: Not on file  . Intimate partner violence    Fear of current or ex partner: Not on file    Emotionally abused: Not on file    Physically abused: Not on file    Forced sexual activity: Not on file  Other Topics Concern  . Not on file  Social History Narrative   2 caffeine drinks daily    Family History  Problem Relation Age of Onset  . Hypertension Father   . Heart  disease Father   . Prostate cancer Father        dx late 64s  . Cancer Father 15       prostate ca  . Celiac disease Mother   . Colon polyps Mother   . Breast cancer Mother   . Cancer Mother        Bladder  . Colon cancer Neg Hx   . Esophageal cancer Neg Hx   . Inflammatory bowel disease Neg Hx   . Liver disease Neg Hx   . Pancreatic cancer Neg Hx   . Rectal cancer Neg Hx   . Stomach cancer Neg Hx    I have reviewed his medical, social, and family history in detail and updated the electronic medical record as necessary.  PHYSICAL EXAMINATION  BP 120/80 (BP Location: Left Arm, Patient Position: Sitting, Cuff Size: Normal)   Pulse 72   Temp 97.8 F (36.6 C)   Ht 5' 9.25" (1.759 m) Comment: height measured without shoes  Wt 176 lb (79.8 kg)   BMI 25.80 kg/m  Wt Readings from Last 3 Encounters:  04/05/19 176 lb (79.8 kg)  01/14/19 172 lb (78 kg)  10/27/18 177 lb 12 oz (80.6 kg)  GEN: NAD, appears stated age, doesn't appear chronically ill PSYCH: Cooperative, without pressured speech EYE: Conjunctivae pink, sclerae anicteric ENT: MMM CV: RR without R/Gs  RESP: CTAB posteriorly, without wheezing GI: NABS, soft, NT/ND, without rebound or guarding, no HSM appreciated MSK/EXT: No lower extremity edema SKIN: No jaundice NEURO:  Alert & Oriented x 3, no focal deficits   REVIEW OF DATA  I reviewed the following data at the time of this encounter:  GI Procedures and Studies  Previously reviewed  Laboratory Studies  Reviewed those in epic and care everywhere  Imaging Studies  No relevant studies to review   ASSESSMENT  Mr. Gerry is a 63 y.o. male with a pmh significant for s/p Auto-SCT for AML but now with developing MDS with likely need for Allo-SCT, Diverticulosis, MDD/Anxiety, Arthritis, HTN, HLD, Nephrolithiasis, Hiatal Hernia, GERD.  The patient is seen today for evaluation and management of:  1. Gastroesophageal reflux disease without esophagitis   2. Anemia,  unspecified type   3. Myelodysplasia (myelodysplastic syndrome) ([facility])    Overall, patient's GERD symptoms are relatively controlled on current regimen.  Although some throat clearing and mild dysphagia has occurred his main focus at this point in time is his underlying MDS and the treatments that may be necessary.  He has had anemia noted as a result of his MDS but a full work-up of laboratories could not be found on care everywhere are on epic and I will plan to finalize his vitamin and micronutrient evaluation to ensure no nutritional deficiencies exist.  If he were to have an iron deficiency anemia, I think less likely as result of the normocytic anemia, then would consider the role of a colonoscopy earlier than the planned follow-up in 2022. or if it is felt necessary prior to stem cell transplant we can always get that done prior, however will defer that to his hematology/oncology treatment team.  I will plan to see him back in approximately 6 months to see how things are doing but he can feel free to cancel that appointment for postponement depending on how he is doing overall.  I have wished him the best and will update him upon the results of his laboratory evaluation   All patient questions were answered, to the best of my ability, and the patient agrees to the aforementioned plan of action with follow-up as indicated.   PLAN  Continue omeprazole once daily 40 mg Depending on how he does post treatment for MDS we will consider role of surgical evaluation for GERD hiatal hernia size and symptoms may consider role of surgical evaluation in future Anemia evaluation based on laboratories as below Colonoscopy in 2022 for screening   Orders Placed This Encounter  Procedures  . IBC + Ferritin  . Folate    New Prescriptions   No medications on file   Modified Medications   Modified Medication Previous Medication   OMEPRAZOLE (PRILOSEC) 40 MG CAPSULE omeprazole (PRILOSEC) 40 MG capsule       Take 1 capsule (40 mg total) by mouth daily.  Take 1 capsule (40 mg total) by mouth daily.    Planned Follow Up: No follow-ups on file.   Justice Britain, MD [facility] # [id number]

## 2019-04-06 DIAGNOSIS — D469 Myelodysplastic syndrome, unspecified: Secondary | ICD-10-CM | POA: Diagnosis not present

## 2019-04-07 DIAGNOSIS — D469 Myelodysplastic syndrome, unspecified: Secondary | ICD-10-CM | POA: Diagnosis not present

## 2019-04-08 ENCOUNTER — Encounter: Payer: Self-pay | Admitting: Gastroenterology

## 2019-04-08 DIAGNOSIS — D649 Anemia, unspecified: Secondary | ICD-10-CM | POA: Insufficient documentation

## 2019-04-08 DIAGNOSIS — Z9481 Bone marrow transplant status: Secondary | ICD-10-CM | POA: Diagnosis not present

## 2019-04-08 DIAGNOSIS — D72819 Decreased white blood cell count, unspecified: Secondary | ICD-10-CM | POA: Diagnosis not present

## 2019-04-08 DIAGNOSIS — C9201 Acute myeloblastic leukemia, in remission: Secondary | ICD-10-CM | POA: Diagnosis not present

## 2019-04-08 DIAGNOSIS — D469 Myelodysplastic syndrome, unspecified: Secondary | ICD-10-CM | POA: Insufficient documentation

## 2019-04-08 DIAGNOSIS — D4620 Refractory anemia with excess of blasts, unspecified: Secondary | ICD-10-CM | POA: Diagnosis not present

## 2019-04-12 DIAGNOSIS — D469 Myelodysplastic syndrome, unspecified: Secondary | ICD-10-CM | POA: Diagnosis not present

## 2019-04-13 DIAGNOSIS — D4620 Refractory anemia with excess of blasts, unspecified: Secondary | ICD-10-CM | POA: Diagnosis not present

## 2019-04-13 DIAGNOSIS — F419 Anxiety disorder, unspecified: Secondary | ICD-10-CM | POA: Diagnosis not present

## 2019-04-13 DIAGNOSIS — F438 Other reactions to severe stress: Secondary | ICD-10-CM | POA: Diagnosis not present

## 2019-04-17 ENCOUNTER — Encounter: Payer: Self-pay | Admitting: Family Medicine

## 2019-04-20 DIAGNOSIS — Z01812 Encounter for preprocedural laboratory examination: Secondary | ICD-10-CM | POA: Diagnosis not present

## 2019-04-20 DIAGNOSIS — D469 Myelodysplastic syndrome, unspecified: Secondary | ICD-10-CM | POA: Diagnosis not present

## 2019-04-20 DIAGNOSIS — Z20828 Contact with and (suspected) exposure to other viral communicable diseases: Secondary | ICD-10-CM | POA: Diagnosis not present

## 2019-04-21 DIAGNOSIS — F419 Anxiety disorder, unspecified: Secondary | ICD-10-CM | POA: Diagnosis not present

## 2019-04-21 DIAGNOSIS — F438 Other reactions to severe stress: Secondary | ICD-10-CM | POA: Diagnosis not present

## 2019-04-21 DIAGNOSIS — J301 Allergic rhinitis due to pollen: Secondary | ICD-10-CM | POA: Diagnosis not present

## 2019-04-26 DIAGNOSIS — I361 Nonrheumatic tricuspid (valve) insufficiency: Secondary | ICD-10-CM | POA: Diagnosis not present

## 2019-04-26 DIAGNOSIS — R0609 Other forms of dyspnea: Secondary | ICD-10-CM | POA: Diagnosis not present

## 2019-04-26 DIAGNOSIS — I340 Nonrheumatic mitral (valve) insufficiency: Secondary | ICD-10-CM | POA: Diagnosis not present

## 2019-04-26 DIAGNOSIS — R9431 Abnormal electrocardiogram [ECG] [EKG]: Secondary | ICD-10-CM | POA: Diagnosis not present

## 2019-04-26 DIAGNOSIS — I517 Cardiomegaly: Secondary | ICD-10-CM | POA: Diagnosis not present

## 2019-04-26 DIAGNOSIS — Z01818 Encounter for other preprocedural examination: Secondary | ICD-10-CM | POA: Diagnosis not present

## 2019-04-26 DIAGNOSIS — D469 Myelodysplastic syndrome, unspecified: Secondary | ICD-10-CM | POA: Diagnosis not present

## 2019-04-28 DIAGNOSIS — F419 Anxiety disorder, unspecified: Secondary | ICD-10-CM | POA: Diagnosis not present

## 2019-04-28 DIAGNOSIS — F438 Other reactions to severe stress: Secondary | ICD-10-CM | POA: Diagnosis not present

## 2019-04-29 DIAGNOSIS — Z85828 Personal history of other malignant neoplasm of skin: Secondary | ICD-10-CM | POA: Diagnosis not present

## 2019-04-29 DIAGNOSIS — E785 Hyperlipidemia, unspecified: Secondary | ICD-10-CM | POA: Diagnosis not present

## 2019-04-29 DIAGNOSIS — K219 Gastro-esophageal reflux disease without esophagitis: Secondary | ICD-10-CM | POA: Diagnosis not present

## 2019-04-29 DIAGNOSIS — Z79899 Other long term (current) drug therapy: Secondary | ICD-10-CM | POA: Diagnosis not present

## 2019-04-29 DIAGNOSIS — Z95828 Presence of other vascular implants and grafts: Secondary | ICD-10-CM | POA: Diagnosis not present

## 2019-04-29 DIAGNOSIS — Z9221 Personal history of antineoplastic chemotherapy: Secondary | ICD-10-CM | POA: Diagnosis not present

## 2019-04-29 DIAGNOSIS — I10 Essential (primary) hypertension: Secondary | ICD-10-CM | POA: Diagnosis not present

## 2019-04-29 DIAGNOSIS — F329 Major depressive disorder, single episode, unspecified: Secondary | ICD-10-CM | POA: Diagnosis not present

## 2019-04-29 DIAGNOSIS — C92A0 Acute myeloid leukemia with multilineage dysplasia, not having achieved remission: Secondary | ICD-10-CM | POA: Diagnosis not present

## 2019-04-29 DIAGNOSIS — C9201 Acute myeloblastic leukemia, in remission: Secondary | ICD-10-CM | POA: Diagnosis not present

## 2019-04-29 DIAGNOSIS — D4620 Refractory anemia with excess of blasts, unspecified: Secondary | ICD-10-CM | POA: Diagnosis not present

## 2019-04-29 LAB — HEPATIC FUNCTION PANEL
ALT: 26 (ref 10–40)
AST: 19 (ref 14–40)
Alkaline Phosphatase: 46 (ref 25–125)
Bilirubin, Total: 0.6

## 2019-04-29 LAB — BASIC METABOLIC PANEL
BUN: 22 — AB (ref 4–21)
Creatinine: 1.1 (ref 0.6–1.3)
Glucose: 86
Potassium: 4.2 (ref 3.4–5.3)
Sodium: 139 (ref 137–147)

## 2019-04-29 LAB — CBC AND DIFFERENTIAL
HCT: 34 — AB (ref 41–53)
Hemoglobin: 11.8 — AB (ref 13.5–17.5)
Neutrophils Absolute: 1
Platelets: 226 (ref 150–399)
WBC: 2.3

## 2019-05-02 DIAGNOSIS — D469 Myelodysplastic syndrome, unspecified: Secondary | ICD-10-CM | POA: Diagnosis not present

## 2019-05-05 DIAGNOSIS — F419 Anxiety disorder, unspecified: Secondary | ICD-10-CM | POA: Diagnosis not present

## 2019-05-05 DIAGNOSIS — F438 Other reactions to severe stress: Secondary | ICD-10-CM | POA: Diagnosis not present

## 2019-05-09 ENCOUNTER — Encounter: Payer: Self-pay | Admitting: Family Medicine

## 2019-05-09 DIAGNOSIS — D469 Myelodysplastic syndrome, unspecified: Secondary | ICD-10-CM | POA: Diagnosis not present

## 2019-05-10 DIAGNOSIS — D469 Myelodysplastic syndrome, unspecified: Secondary | ICD-10-CM | POA: Diagnosis not present

## 2019-05-11 DIAGNOSIS — D469 Myelodysplastic syndrome, unspecified: Secondary | ICD-10-CM | POA: Diagnosis not present

## 2019-05-12 DIAGNOSIS — D469 Myelodysplastic syndrome, unspecified: Secondary | ICD-10-CM | POA: Diagnosis not present

## 2019-05-12 DIAGNOSIS — C9201 Acute myeloblastic leukemia, in remission: Secondary | ICD-10-CM | POA: Diagnosis not present

## 2019-05-12 DIAGNOSIS — D4620 Refractory anemia with excess of blasts, unspecified: Secondary | ICD-10-CM | POA: Diagnosis not present

## 2019-05-13 DIAGNOSIS — D469 Myelodysplastic syndrome, unspecified: Secondary | ICD-10-CM | POA: Diagnosis not present

## 2019-05-16 DIAGNOSIS — D469 Myelodysplastic syndrome, unspecified: Secondary | ICD-10-CM | POA: Diagnosis not present

## 2019-05-19 DIAGNOSIS — Z9481 Bone marrow transplant status: Secondary | ICD-10-CM | POA: Diagnosis not present

## 2019-05-19 DIAGNOSIS — D469 Myelodysplastic syndrome, unspecified: Secondary | ICD-10-CM | POA: Diagnosis not present

## 2019-05-23 DIAGNOSIS — D469 Myelodysplastic syndrome, unspecified: Secondary | ICD-10-CM | POA: Diagnosis not present

## 2019-05-23 DIAGNOSIS — Z452 Encounter for adjustment and management of vascular access device: Secondary | ICD-10-CM | POA: Diagnosis not present

## 2019-05-23 DIAGNOSIS — C9200 Acute myeloblastic leukemia, not having achieved remission: Secondary | ICD-10-CM | POA: Diagnosis not present

## 2019-05-24 DIAGNOSIS — H25013 Cortical age-related cataract, bilateral: Secondary | ICD-10-CM | POA: Diagnosis not present

## 2019-05-30 DIAGNOSIS — Z01818 Encounter for other preprocedural examination: Secondary | ICD-10-CM | POA: Diagnosis not present

## 2019-05-30 DIAGNOSIS — D469 Myelodysplastic syndrome, unspecified: Secondary | ICD-10-CM | POA: Diagnosis not present

## 2019-05-30 DIAGNOSIS — Z9481 Bone marrow transplant status: Secondary | ICD-10-CM | POA: Diagnosis not present

## 2019-05-30 DIAGNOSIS — C9201 Acute myeloblastic leukemia, in remission: Secondary | ICD-10-CM | POA: Diagnosis not present

## 2019-05-31 DIAGNOSIS — R918 Other nonspecific abnormal finding of lung field: Secondary | ICD-10-CM | POA: Diagnosis not present

## 2019-05-31 DIAGNOSIS — J984 Other disorders of lung: Secondary | ICD-10-CM | POA: Diagnosis not present

## 2019-05-31 DIAGNOSIS — A040 Enteropathogenic Escherichia coli infection: Secondary | ICD-10-CM | POA: Diagnosis not present

## 2019-05-31 DIAGNOSIS — K219 Gastro-esophageal reflux disease without esophagitis: Secondary | ICD-10-CM | POA: Diagnosis not present

## 2019-05-31 DIAGNOSIS — Z1623 Resistance to quinolones and fluoroquinolones: Secondary | ICD-10-CM | POA: Diagnosis not present

## 2019-05-31 DIAGNOSIS — I340 Nonrheumatic mitral (valve) insufficiency: Secondary | ICD-10-CM | POA: Diagnosis not present

## 2019-05-31 DIAGNOSIS — E119 Type 2 diabetes mellitus without complications: Secondary | ICD-10-CM | POA: Diagnosis not present

## 2019-05-31 DIAGNOSIS — D72829 Elevated white blood cell count, unspecified: Secondary | ICD-10-CM | POA: Diagnosis not present

## 2019-05-31 DIAGNOSIS — Z5111 Encounter for antineoplastic chemotherapy: Secondary | ICD-10-CM | POA: Diagnosis not present

## 2019-05-31 DIAGNOSIS — M25552 Pain in left hip: Secondary | ICD-10-CM | POA: Diagnosis not present

## 2019-05-31 DIAGNOSIS — C946 Myelodysplastic disease, not classified: Secondary | ICD-10-CM | POA: Diagnosis not present

## 2019-05-31 DIAGNOSIS — I10 Essential (primary) hypertension: Secondary | ICD-10-CM | POA: Diagnosis not present

## 2019-05-31 DIAGNOSIS — J9691 Respiratory failure, unspecified with hypoxia: Secondary | ICD-10-CM | POA: Diagnosis not present

## 2019-05-31 DIAGNOSIS — R000 Tachycardia, unspecified: Secondary | ICD-10-CM | POA: Diagnosis not present

## 2019-05-31 DIAGNOSIS — M25522 Pain in left elbow: Secondary | ICD-10-CM | POA: Diagnosis not present

## 2019-05-31 DIAGNOSIS — D72819 Decreased white blood cell count, unspecified: Secondary | ICD-10-CM | POA: Diagnosis not present

## 2019-05-31 DIAGNOSIS — J90 Pleural effusion, not elsewhere classified: Secondary | ICD-10-CM | POA: Diagnosis not present

## 2019-05-31 DIAGNOSIS — K649 Unspecified hemorrhoids: Secondary | ICD-10-CM | POA: Diagnosis not present

## 2019-05-31 DIAGNOSIS — R0600 Dyspnea, unspecified: Secondary | ICD-10-CM | POA: Diagnosis not present

## 2019-05-31 DIAGNOSIS — M545 Low back pain: Secondary | ICD-10-CM | POA: Diagnosis not present

## 2019-05-31 DIAGNOSIS — D61810 Antineoplastic chemotherapy induced pancytopenia: Secondary | ICD-10-CM | POA: Diagnosis not present

## 2019-05-31 DIAGNOSIS — Z9484 Stem cells transplant status: Secondary | ICD-10-CM | POA: Diagnosis not present

## 2019-05-31 DIAGNOSIS — E8770 Fluid overload, unspecified: Secondary | ICD-10-CM | POA: Diagnosis not present

## 2019-05-31 DIAGNOSIS — Z856 Personal history of leukemia: Secondary | ICD-10-CM | POA: Diagnosis not present

## 2019-05-31 DIAGNOSIS — D61818 Other pancytopenia: Secondary | ICD-10-CM | POA: Diagnosis not present

## 2019-05-31 DIAGNOSIS — D709 Neutropenia, unspecified: Secondary | ICD-10-CM | POA: Diagnosis not present

## 2019-05-31 DIAGNOSIS — D708 Other neutropenia: Secondary | ICD-10-CM | POA: Diagnosis not present

## 2019-05-31 DIAGNOSIS — D469 Myelodysplastic syndrome, unspecified: Secondary | ICD-10-CM | POA: Diagnosis not present

## 2019-05-31 DIAGNOSIS — D630 Anemia in neoplastic disease: Secondary | ICD-10-CM | POA: Diagnosis not present

## 2019-05-31 DIAGNOSIS — C9201 Acute myeloblastic leukemia, in remission: Secondary | ICD-10-CM | POA: Diagnosis not present

## 2019-05-31 DIAGNOSIS — C9200 Acute myeloblastic leukemia, not having achieved remission: Secondary | ICD-10-CM | POA: Diagnosis not present

## 2019-05-31 DIAGNOSIS — D638 Anemia in other chronic diseases classified elsewhere: Secondary | ICD-10-CM | POA: Diagnosis not present

## 2019-05-31 DIAGNOSIS — E785 Hyperlipidemia, unspecified: Secondary | ICD-10-CM | POA: Diagnosis not present

## 2019-05-31 DIAGNOSIS — D89813 Graft-versus-host disease, unspecified: Secondary | ICD-10-CM | POA: Diagnosis not present

## 2019-05-31 DIAGNOSIS — F329 Major depressive disorder, single episode, unspecified: Secondary | ICD-10-CM | POA: Diagnosis not present

## 2019-05-31 DIAGNOSIS — R7881 Bacteremia: Secondary | ICD-10-CM | POA: Diagnosis not present

## 2019-05-31 DIAGNOSIS — B954 Other streptococcus as the cause of diseases classified elsewhere: Secondary | ICD-10-CM | POA: Diagnosis not present

## 2019-05-31 DIAGNOSIS — R509 Fever, unspecified: Secondary | ICD-10-CM | POA: Diagnosis not present

## 2019-05-31 DIAGNOSIS — T80319A ABO incompatibility with hemolytic transfusion reaction, unspecified, initial encounter: Secondary | ICD-10-CM | POA: Diagnosis not present

## 2019-06-28 DIAGNOSIS — M545 Low back pain: Secondary | ICD-10-CM | POA: Diagnosis not present

## 2019-06-28 DIAGNOSIS — M25522 Pain in left elbow: Secondary | ICD-10-CM | POA: Diagnosis not present

## 2019-06-28 DIAGNOSIS — M25552 Pain in left hip: Secondary | ICD-10-CM | POA: Diagnosis not present

## 2019-06-30 DIAGNOSIS — I272 Pulmonary hypertension, unspecified: Secondary | ICD-10-CM | POA: Diagnosis not present

## 2019-06-30 DIAGNOSIS — Z9481 Bone marrow transplant status: Secondary | ICD-10-CM | POA: Diagnosis not present

## 2019-06-30 DIAGNOSIS — Z9484 Stem cells transplant status: Secondary | ICD-10-CM | POA: Diagnosis not present

## 2019-06-30 DIAGNOSIS — D469 Myelodysplastic syndrome, unspecified: Secondary | ICD-10-CM | POA: Diagnosis not present

## 2019-06-30 DIAGNOSIS — Z79899 Other long term (current) drug therapy: Secondary | ICD-10-CM | POA: Diagnosis not present

## 2019-06-30 DIAGNOSIS — C9201 Acute myeloblastic leukemia, in remission: Secondary | ICD-10-CM | POA: Diagnosis not present

## 2019-07-04 DIAGNOSIS — Z79899 Other long term (current) drug therapy: Secondary | ICD-10-CM | POA: Diagnosis not present

## 2019-07-04 DIAGNOSIS — D4620 Refractory anemia with excess of blasts, unspecified: Secondary | ICD-10-CM | POA: Diagnosis not present

## 2019-07-04 DIAGNOSIS — I272 Pulmonary hypertension, unspecified: Secondary | ICD-10-CM | POA: Diagnosis not present

## 2019-07-04 DIAGNOSIS — Z9484 Stem cells transplant status: Secondary | ICD-10-CM | POA: Diagnosis not present

## 2019-07-04 DIAGNOSIS — Z9481 Bone marrow transplant status: Secondary | ICD-10-CM | POA: Diagnosis not present

## 2019-07-04 DIAGNOSIS — C9201 Acute myeloblastic leukemia, in remission: Secondary | ICD-10-CM | POA: Diagnosis not present

## 2019-07-04 DIAGNOSIS — D469 Myelodysplastic syndrome, unspecified: Secondary | ICD-10-CM | POA: Diagnosis not present

## 2019-07-06 DIAGNOSIS — R7989 Other specified abnormal findings of blood chemistry: Secondary | ICD-10-CM | POA: Diagnosis not present

## 2019-07-06 DIAGNOSIS — K219 Gastro-esophageal reflux disease without esophagitis: Secondary | ICD-10-CM | POA: Diagnosis not present

## 2019-07-06 DIAGNOSIS — C946 Myelodysplastic disease, not classified: Secondary | ICD-10-CM | POA: Diagnosis not present

## 2019-07-06 DIAGNOSIS — E785 Hyperlipidemia, unspecified: Secondary | ICD-10-CM | POA: Diagnosis not present

## 2019-07-06 DIAGNOSIS — Z79899 Other long term (current) drug therapy: Secondary | ICD-10-CM | POA: Diagnosis not present

## 2019-07-06 DIAGNOSIS — I272 Pulmonary hypertension, unspecified: Secondary | ICD-10-CM | POA: Diagnosis not present

## 2019-07-06 DIAGNOSIS — I10 Essential (primary) hypertension: Secondary | ICD-10-CM | POA: Diagnosis not present

## 2019-07-06 DIAGNOSIS — C9201 Acute myeloblastic leukemia, in remission: Secondary | ICD-10-CM | POA: Diagnosis not present

## 2019-07-06 DIAGNOSIS — I340 Nonrheumatic mitral (valve) insufficiency: Secondary | ICD-10-CM | POA: Diagnosis not present

## 2019-07-06 DIAGNOSIS — Z9481 Bone marrow transplant status: Secondary | ICD-10-CM | POA: Diagnosis not present

## 2019-07-06 DIAGNOSIS — D8489 Other immunodeficiencies: Secondary | ICD-10-CM | POA: Diagnosis not present

## 2019-07-11 DIAGNOSIS — C9201 Acute myeloblastic leukemia, in remission: Secondary | ICD-10-CM | POA: Diagnosis not present

## 2019-07-11 DIAGNOSIS — Z9481 Bone marrow transplant status: Secondary | ICD-10-CM | POA: Diagnosis not present

## 2019-07-11 DIAGNOSIS — D4620 Refractory anemia with excess of blasts, unspecified: Secondary | ICD-10-CM | POA: Diagnosis not present

## 2019-07-11 DIAGNOSIS — Z9221 Personal history of antineoplastic chemotherapy: Secondary | ICD-10-CM | POA: Diagnosis not present

## 2019-07-14 DIAGNOSIS — Z79899 Other long term (current) drug therapy: Secondary | ICD-10-CM | POA: Diagnosis not present

## 2019-07-14 DIAGNOSIS — I272 Pulmonary hypertension, unspecified: Secondary | ICD-10-CM | POA: Diagnosis not present

## 2019-07-14 DIAGNOSIS — Z9484 Stem cells transplant status: Secondary | ICD-10-CM | POA: Diagnosis not present

## 2019-07-14 DIAGNOSIS — D469 Myelodysplastic syndrome, unspecified: Secondary | ICD-10-CM | POA: Diagnosis not present

## 2019-07-14 DIAGNOSIS — C9201 Acute myeloblastic leukemia, in remission: Secondary | ICD-10-CM | POA: Diagnosis not present

## 2019-07-14 DIAGNOSIS — Z9481 Bone marrow transplant status: Secondary | ICD-10-CM | POA: Diagnosis not present

## 2019-07-14 DIAGNOSIS — N289 Disorder of kidney and ureter, unspecified: Secondary | ICD-10-CM | POA: Diagnosis not present

## 2019-07-14 DIAGNOSIS — G629 Polyneuropathy, unspecified: Secondary | ICD-10-CM | POA: Diagnosis not present

## 2019-07-15 DIAGNOSIS — I119 Hypertensive heart disease without heart failure: Secondary | ICD-10-CM | POA: Diagnosis not present

## 2019-07-15 DIAGNOSIS — I340 Nonrheumatic mitral (valve) insufficiency: Secondary | ICD-10-CM | POA: Diagnosis not present

## 2019-07-18 DIAGNOSIS — C9201 Acute myeloblastic leukemia, in remission: Secondary | ICD-10-CM | POA: Diagnosis not present

## 2019-07-18 DIAGNOSIS — Z79899 Other long term (current) drug therapy: Secondary | ICD-10-CM | POA: Diagnosis not present

## 2019-07-18 DIAGNOSIS — G629 Polyneuropathy, unspecified: Secondary | ICD-10-CM | POA: Diagnosis not present

## 2019-07-18 DIAGNOSIS — Z792 Long term (current) use of antibiotics: Secondary | ICD-10-CM | POA: Diagnosis not present

## 2019-07-18 DIAGNOSIS — D4620 Refractory anemia with excess of blasts, unspecified: Secondary | ICD-10-CM | POA: Diagnosis not present

## 2019-07-18 DIAGNOSIS — Z9481 Bone marrow transplant status: Secondary | ICD-10-CM | POA: Diagnosis not present

## 2019-07-22 DIAGNOSIS — D84821 Immunodeficiency due to drugs: Secondary | ICD-10-CM | POA: Diagnosis not present

## 2019-07-22 DIAGNOSIS — D4620 Refractory anemia with excess of blasts, unspecified: Secondary | ICD-10-CM | POA: Diagnosis not present

## 2019-07-22 DIAGNOSIS — R634 Abnormal weight loss: Secondary | ICD-10-CM | POA: Diagnosis not present

## 2019-07-22 DIAGNOSIS — R197 Diarrhea, unspecified: Secondary | ICD-10-CM | POA: Diagnosis not present

## 2019-07-22 DIAGNOSIS — Z20828 Contact with and (suspected) exposure to other viral communicable diseases: Secondary | ICD-10-CM | POA: Diagnosis not present

## 2019-07-22 DIAGNOSIS — D469 Myelodysplastic syndrome, unspecified: Secondary | ICD-10-CM | POA: Diagnosis not present

## 2019-07-22 DIAGNOSIS — R7401 Elevation of levels of liver transaminase levels: Secondary | ICD-10-CM | POA: Diagnosis not present

## 2019-07-22 DIAGNOSIS — Z6825 Body mass index (BMI) 25.0-25.9, adult: Secondary | ICD-10-CM | POA: Diagnosis not present

## 2019-07-22 DIAGNOSIS — T451X5A Adverse effect of antineoplastic and immunosuppressive drugs, initial encounter: Secondary | ICD-10-CM | POA: Diagnosis not present

## 2019-07-22 DIAGNOSIS — Z9481 Bone marrow transplant status: Secondary | ICD-10-CM | POA: Diagnosis not present

## 2019-07-22 DIAGNOSIS — Z79899 Other long term (current) drug therapy: Secondary | ICD-10-CM | POA: Diagnosis not present

## 2019-07-22 DIAGNOSIS — C9201 Acute myeloblastic leukemia, in remission: Secondary | ICD-10-CM | POA: Diagnosis not present

## 2019-07-22 DIAGNOSIS — X58XXXA Exposure to other specified factors, initial encounter: Secondary | ICD-10-CM | POA: Diagnosis not present

## 2019-07-22 DIAGNOSIS — Z9484 Stem cells transplant status: Secondary | ICD-10-CM | POA: Diagnosis not present

## 2019-07-22 DIAGNOSIS — R110 Nausea: Secondary | ICD-10-CM | POA: Diagnosis not present

## 2019-07-22 DIAGNOSIS — I340 Nonrheumatic mitral (valve) insufficiency: Secondary | ICD-10-CM | POA: Diagnosis not present

## 2019-07-25 DIAGNOSIS — K2950 Unspecified chronic gastritis without bleeding: Secondary | ICD-10-CM | POA: Diagnosis not present

## 2019-07-25 DIAGNOSIS — R634 Abnormal weight loss: Secondary | ICD-10-CM | POA: Diagnosis not present

## 2019-07-25 DIAGNOSIS — C9201 Acute myeloblastic leukemia, in remission: Secondary | ICD-10-CM | POA: Diagnosis not present

## 2019-07-25 DIAGNOSIS — Z9481 Bone marrow transplant status: Secondary | ICD-10-CM | POA: Diagnosis not present

## 2019-07-25 DIAGNOSIS — K317 Polyp of stomach and duodenum: Secondary | ICD-10-CM | POA: Diagnosis not present

## 2019-07-25 DIAGNOSIS — R197 Diarrhea, unspecified: Secondary | ICD-10-CM | POA: Diagnosis not present

## 2019-07-25 DIAGNOSIS — K529 Noninfective gastroenteritis and colitis, unspecified: Secondary | ICD-10-CM | POA: Diagnosis not present

## 2019-07-25 DIAGNOSIS — Z9484 Stem cells transplant status: Secondary | ICD-10-CM | POA: Diagnosis not present

## 2019-08-01 DIAGNOSIS — Z9484 Stem cells transplant status: Secondary | ICD-10-CM | POA: Diagnosis not present

## 2019-08-01 DIAGNOSIS — D469 Myelodysplastic syndrome, unspecified: Secondary | ICD-10-CM | POA: Diagnosis not present

## 2019-08-01 DIAGNOSIS — Z9481 Bone marrow transplant status: Secondary | ICD-10-CM | POA: Diagnosis not present

## 2019-08-01 DIAGNOSIS — I119 Hypertensive heart disease without heart failure: Secondary | ICD-10-CM | POA: Diagnosis not present

## 2019-08-01 DIAGNOSIS — Z79899 Other long term (current) drug therapy: Secondary | ICD-10-CM | POA: Diagnosis not present

## 2019-08-01 DIAGNOSIS — I340 Nonrheumatic mitral (valve) insufficiency: Secondary | ICD-10-CM | POA: Diagnosis not present

## 2019-08-01 DIAGNOSIS — D89813 Graft-versus-host disease, unspecified: Secondary | ICD-10-CM | POA: Diagnosis not present

## 2019-08-01 DIAGNOSIS — G629 Polyneuropathy, unspecified: Secondary | ICD-10-CM | POA: Diagnosis not present

## 2019-08-01 DIAGNOSIS — I272 Pulmonary hypertension, unspecified: Secondary | ICD-10-CM | POA: Diagnosis not present

## 2019-08-01 DIAGNOSIS — N189 Chronic kidney disease, unspecified: Secondary | ICD-10-CM | POA: Diagnosis not present

## 2019-08-01 DIAGNOSIS — G479 Sleep disorder, unspecified: Secondary | ICD-10-CM | POA: Diagnosis not present

## 2019-08-01 DIAGNOSIS — C9201 Acute myeloblastic leukemia, in remission: Secondary | ICD-10-CM | POA: Diagnosis not present

## 2019-08-01 DIAGNOSIS — R197 Diarrhea, unspecified: Secondary | ICD-10-CM | POA: Diagnosis not present

## 2019-08-08 DIAGNOSIS — Z79899 Other long term (current) drug therapy: Secondary | ICD-10-CM | POA: Diagnosis not present

## 2019-08-08 DIAGNOSIS — K529 Noninfective gastroenteritis and colitis, unspecified: Secondary | ICD-10-CM | POA: Diagnosis not present

## 2019-08-08 DIAGNOSIS — D4620 Refractory anemia with excess of blasts, unspecified: Secondary | ICD-10-CM | POA: Diagnosis not present

## 2019-08-08 DIAGNOSIS — Z7952 Long term (current) use of systemic steroids: Secondary | ICD-10-CM | POA: Diagnosis not present

## 2019-08-08 DIAGNOSIS — C9201 Acute myeloblastic leukemia, in remission: Secondary | ICD-10-CM | POA: Diagnosis not present

## 2019-08-08 DIAGNOSIS — Z9481 Bone marrow transplant status: Secondary | ICD-10-CM | POA: Diagnosis not present

## 2019-08-08 DIAGNOSIS — K2950 Unspecified chronic gastritis without bleeding: Secondary | ICD-10-CM | POA: Diagnosis not present

## 2019-08-15 DIAGNOSIS — B009 Herpesviral infection, unspecified: Secondary | ICD-10-CM | POA: Diagnosis not present

## 2019-08-15 DIAGNOSIS — Z9481 Bone marrow transplant status: Secondary | ICD-10-CM | POA: Diagnosis not present

## 2019-08-15 DIAGNOSIS — Z9484 Stem cells transplant status: Secondary | ICD-10-CM | POA: Diagnosis not present

## 2019-08-15 DIAGNOSIS — G629 Polyneuropathy, unspecified: Secondary | ICD-10-CM | POA: Diagnosis not present

## 2019-08-15 DIAGNOSIS — I272 Pulmonary hypertension, unspecified: Secondary | ICD-10-CM | POA: Diagnosis not present

## 2019-08-15 DIAGNOSIS — C9201 Acute myeloblastic leukemia, in remission: Secondary | ICD-10-CM | POA: Diagnosis not present

## 2019-08-15 DIAGNOSIS — Z79899 Other long term (current) drug therapy: Secondary | ICD-10-CM | POA: Diagnosis not present

## 2019-08-15 DIAGNOSIS — I5030 Unspecified diastolic (congestive) heart failure: Secondary | ICD-10-CM | POA: Diagnosis not present

## 2019-08-15 DIAGNOSIS — C946 Myelodysplastic disease, not classified: Secondary | ICD-10-CM | POA: Diagnosis not present

## 2019-08-22 DIAGNOSIS — R350 Frequency of micturition: Secondary | ICD-10-CM | POA: Diagnosis not present

## 2019-08-22 DIAGNOSIS — Z9481 Bone marrow transplant status: Secondary | ICD-10-CM | POA: Diagnosis not present

## 2019-08-22 DIAGNOSIS — R7989 Other specified abnormal findings of blood chemistry: Secondary | ICD-10-CM | POA: Diagnosis not present

## 2019-08-22 DIAGNOSIS — C9201 Acute myeloblastic leukemia, in remission: Secondary | ICD-10-CM | POA: Diagnosis not present

## 2019-09-05 DIAGNOSIS — Z9481 Bone marrow transplant status: Secondary | ICD-10-CM | POA: Diagnosis not present

## 2019-09-05 DIAGNOSIS — C9201 Acute myeloblastic leukemia, in remission: Secondary | ICD-10-CM | POA: Diagnosis not present

## 2019-09-05 DIAGNOSIS — D4620 Refractory anemia with excess of blasts, unspecified: Secondary | ICD-10-CM | POA: Diagnosis not present

## 2019-09-20 DIAGNOSIS — Z9484 Stem cells transplant status: Secondary | ICD-10-CM | POA: Diagnosis not present

## 2019-09-20 DIAGNOSIS — I272 Pulmonary hypertension, unspecified: Secondary | ICD-10-CM | POA: Diagnosis not present

## 2019-09-20 DIAGNOSIS — D469 Myelodysplastic syndrome, unspecified: Secondary | ICD-10-CM | POA: Diagnosis not present

## 2019-09-20 DIAGNOSIS — G629 Polyneuropathy, unspecified: Secondary | ICD-10-CM | POA: Diagnosis not present

## 2019-09-20 DIAGNOSIS — I340 Nonrheumatic mitral (valve) insufficiency: Secondary | ICD-10-CM | POA: Diagnosis not present

## 2019-09-20 DIAGNOSIS — G479 Sleep disorder, unspecified: Secondary | ICD-10-CM | POA: Diagnosis not present

## 2019-09-20 DIAGNOSIS — R350 Frequency of micturition: Secondary | ICD-10-CM | POA: Diagnosis not present

## 2019-09-20 DIAGNOSIS — Z9481 Bone marrow transplant status: Secondary | ICD-10-CM | POA: Diagnosis not present

## 2019-09-20 DIAGNOSIS — I5030 Unspecified diastolic (congestive) heart failure: Secondary | ICD-10-CM | POA: Diagnosis not present

## 2019-09-20 DIAGNOSIS — Z79899 Other long term (current) drug therapy: Secondary | ICD-10-CM | POA: Diagnosis not present

## 2019-09-20 DIAGNOSIS — D801 Nonfamilial hypogammaglobulinemia: Secondary | ICD-10-CM | POA: Diagnosis not present

## 2019-09-20 DIAGNOSIS — C9201 Acute myeloblastic leukemia, in remission: Secondary | ICD-10-CM | POA: Diagnosis not present

## 2019-09-22 DIAGNOSIS — D4620 Refractory anemia with excess of blasts, unspecified: Secondary | ICD-10-CM | POA: Diagnosis not present

## 2019-09-22 DIAGNOSIS — Z9481 Bone marrow transplant status: Secondary | ICD-10-CM | POA: Diagnosis not present

## 2019-09-22 DIAGNOSIS — C9201 Acute myeloblastic leukemia, in remission: Secondary | ICD-10-CM | POA: Diagnosis not present

## 2019-10-03 DIAGNOSIS — G629 Polyneuropathy, unspecified: Secondary | ICD-10-CM | POA: Diagnosis not present

## 2019-10-03 DIAGNOSIS — Z9481 Bone marrow transplant status: Secondary | ICD-10-CM | POA: Diagnosis not present

## 2019-10-03 DIAGNOSIS — C9201 Acute myeloblastic leukemia, in remission: Secondary | ICD-10-CM | POA: Diagnosis not present

## 2019-10-03 DIAGNOSIS — I10 Essential (primary) hypertension: Secondary | ICD-10-CM | POA: Diagnosis not present

## 2019-10-17 DIAGNOSIS — R21 Rash and other nonspecific skin eruption: Secondary | ICD-10-CM | POA: Diagnosis not present

## 2019-10-17 DIAGNOSIS — N400 Enlarged prostate without lower urinary tract symptoms: Secondary | ICD-10-CM | POA: Diagnosis not present

## 2019-10-17 DIAGNOSIS — Z9484 Stem cells transplant status: Secondary | ICD-10-CM | POA: Diagnosis not present

## 2019-10-17 DIAGNOSIS — C9201 Acute myeloblastic leukemia, in remission: Secondary | ICD-10-CM | POA: Diagnosis not present

## 2019-10-17 DIAGNOSIS — D4620 Refractory anemia with excess of blasts, unspecified: Secondary | ICD-10-CM | POA: Diagnosis not present

## 2019-10-17 DIAGNOSIS — D801 Nonfamilial hypogammaglobulinemia: Secondary | ICD-10-CM | POA: Diagnosis not present

## 2019-10-17 DIAGNOSIS — Z9481 Bone marrow transplant status: Secondary | ICD-10-CM | POA: Diagnosis not present

## 2019-10-25 DIAGNOSIS — C9201 Acute myeloblastic leukemia, in remission: Secondary | ICD-10-CM | POA: Diagnosis not present

## 2019-10-25 DIAGNOSIS — Z9481 Bone marrow transplant status: Secondary | ICD-10-CM | POA: Diagnosis not present

## 2019-10-31 DIAGNOSIS — C9201 Acute myeloblastic leukemia, in remission: Secondary | ICD-10-CM | POA: Diagnosis not present

## 2019-10-31 DIAGNOSIS — Z9481 Bone marrow transplant status: Secondary | ICD-10-CM | POA: Diagnosis not present

## 2019-10-31 DIAGNOSIS — B001 Herpesviral vesicular dermatitis: Secondary | ICD-10-CM | POA: Diagnosis not present

## 2019-10-31 DIAGNOSIS — D649 Anemia, unspecified: Secondary | ICD-10-CM | POA: Diagnosis not present

## 2019-10-31 DIAGNOSIS — G479 Sleep disorder, unspecified: Secondary | ICD-10-CM | POA: Diagnosis not present

## 2019-10-31 DIAGNOSIS — Z23 Encounter for immunization: Secondary | ICD-10-CM | POA: Diagnosis not present

## 2019-10-31 DIAGNOSIS — Z87898 Personal history of other specified conditions: Secondary | ICD-10-CM | POA: Diagnosis not present

## 2019-10-31 DIAGNOSIS — R351 Nocturia: Secondary | ICD-10-CM | POA: Diagnosis not present

## 2019-10-31 DIAGNOSIS — Z792 Long term (current) use of antibiotics: Secondary | ICD-10-CM | POA: Diagnosis not present

## 2019-10-31 DIAGNOSIS — Z79899 Other long term (current) drug therapy: Secondary | ICD-10-CM | POA: Diagnosis not present

## 2019-10-31 DIAGNOSIS — D4620 Refractory anemia with excess of blasts, unspecified: Secondary | ICD-10-CM | POA: Diagnosis not present

## 2019-10-31 DIAGNOSIS — Z8719 Personal history of other diseases of the digestive system: Secondary | ICD-10-CM | POA: Diagnosis not present

## 2019-10-31 DIAGNOSIS — L299 Pruritus, unspecified: Secondary | ICD-10-CM | POA: Diagnosis not present

## 2019-10-31 DIAGNOSIS — Z9484 Stem cells transplant status: Secondary | ICD-10-CM | POA: Diagnosis not present

## 2019-11-21 DIAGNOSIS — D4620 Refractory anemia with excess of blasts, unspecified: Secondary | ICD-10-CM | POA: Diagnosis not present

## 2019-11-21 DIAGNOSIS — Z79899 Other long term (current) drug therapy: Secondary | ICD-10-CM | POA: Diagnosis not present

## 2019-11-21 DIAGNOSIS — F419 Anxiety disorder, unspecified: Secondary | ICD-10-CM | POA: Diagnosis not present

## 2019-11-21 DIAGNOSIS — C9201 Acute myeloblastic leukemia, in remission: Secondary | ICD-10-CM | POA: Diagnosis not present

## 2019-11-21 DIAGNOSIS — D801 Nonfamilial hypogammaglobulinemia: Secondary | ICD-10-CM | POA: Diagnosis not present

## 2019-11-21 DIAGNOSIS — Z9481 Bone marrow transplant status: Secondary | ICD-10-CM | POA: Diagnosis not present

## 2019-11-21 LAB — CBC AND DIFFERENTIAL
HCT: 32 — AB (ref 41–53)
Hemoglobin: 11.3 — AB (ref 13.5–17.5)
Platelets: 289 (ref 150–399)
WBC: 3.8

## 2019-11-21 LAB — HEPATIC FUNCTION PANEL
ALT: 17 (ref 10–40)
AST: 17 (ref 14–40)
Alkaline Phosphatase: 51 (ref 25–125)
Bilirubin, Total: 0.4

## 2019-11-21 LAB — COMPREHENSIVE METABOLIC PANEL
Albumin: 4.2 (ref 3.5–5.0)
Calcium: 9.2 (ref 8.7–10.7)
GFR calc non Af Amer: 62

## 2019-11-21 LAB — BASIC METABOLIC PANEL
BUN: 23 — AB (ref 4–21)
CO2: 23 — AB (ref 13–22)
Chloride: 102 (ref 99–108)
Creatinine: 1.2 (ref 0.6–1.3)
Glucose: 98
Potassium: 4.9 (ref 3.4–5.3)
Sodium: 135 — AB (ref 137–147)

## 2019-11-21 LAB — CBC: RBC: 3.26 — AB (ref 3.87–5.11)

## 2019-11-22 ENCOUNTER — Other Ambulatory Visit: Payer: Self-pay

## 2019-11-22 ENCOUNTER — Ambulatory Visit (INDEPENDENT_AMBULATORY_CARE_PROVIDER_SITE_OTHER): Payer: BC Managed Care – PPO | Admitting: Family Medicine

## 2019-11-22 ENCOUNTER — Encounter: Payer: Self-pay | Admitting: Family Medicine

## 2019-11-22 VITALS — BP 120/80 | HR 73 | Temp 97.7°F | Ht 69.25 in | Wt 165.4 lb

## 2019-11-22 DIAGNOSIS — C9201 Acute myeloblastic leukemia, in remission: Secondary | ICD-10-CM

## 2019-11-22 DIAGNOSIS — Z125 Encounter for screening for malignant neoplasm of prostate: Secondary | ICD-10-CM

## 2019-11-22 DIAGNOSIS — F528 Other sexual dysfunction not due to a substance or known physiological condition: Secondary | ICD-10-CM

## 2019-11-22 DIAGNOSIS — L570 Actinic keratosis: Secondary | ICD-10-CM

## 2019-11-22 DIAGNOSIS — D469 Myelodysplastic syndrome, unspecified: Secondary | ICD-10-CM

## 2019-11-22 DIAGNOSIS — I10 Essential (primary) hypertension: Secondary | ICD-10-CM | POA: Diagnosis not present

## 2019-11-22 DIAGNOSIS — Z0001 Encounter for general adult medical examination with abnormal findings: Secondary | ICD-10-CM | POA: Diagnosis not present

## 2019-11-22 DIAGNOSIS — E785 Hyperlipidemia, unspecified: Secondary | ICD-10-CM

## 2019-11-22 LAB — PSA: PSA: 1.94 ng/mL (ref 0.10–4.00)

## 2019-11-22 LAB — LIPID PANEL
Cholesterol: 204 mg/dL — ABNORMAL HIGH (ref 0–200)
HDL: 48.4 mg/dL (ref >39.00)
LDL Cholesterol: 130 mg/dL — ABNORMAL HIGH (ref 0–99)
NonHDL: 155.94
Total CHOL/HDL Ratio: 4
Triglycerides: 128 mg/dL (ref 0.0–149.0)
VLDL: 25.6 mg/dL (ref 0.0–40.0)

## 2019-11-22 MED ORDER — SILDENAFIL CITRATE 100 MG PO TABS: 50.0000 mg | ORAL_TABLET | ORAL | 6 refills | Status: DC | PRN

## 2019-11-22 NOTE — Assessment & Plan Note (Signed)
Not currently on Lipitor due to concern for interaction with his medications related to his bone marrow transplant.  We will recheck lipid panel today.

## 2019-11-22 NOTE — Assessment & Plan Note (Signed)
Continue management per oncology. 

## 2019-11-22 NOTE — Patient Instructions (Signed)
It was very nice to see you today!  We will check blood work today.  Also place an order for you to get a bone density scan.  I will refill your Viagra today.  We froze a spot on your neck today.  Please let me know if it comes back.  No other changes today.  Come back in 1 year for your next physical with blood work, or sooner if needed.  Take care, Dr [name]  Please try these tips to maintain a healthy lifestyle:   Eat at least 3 REAL meals and 1-2 snacks per day.  Aim for no more than 5 hours between eating.  If you eat breakfast, please do so within one hour of getting up.    Each meal should contain half fruits/vegetables, one quarter protein, and one quarter carbs (no bigger than a computer mouse)   Cut down on sweet beverages. This includes juice, soda, and sweet tea.     Drink at least 1 glass of water with each meal and aim for at least 8 glasses per day   Exercise at least 150 minutes every week.    Preventive Care 9-18 Years Old, Male Preventive care refers to lifestyle choices and visits with your health care provider that can promote health and wellness. This includes:  A yearly physical exam. This is also called an annual well check.  Regular dental and eye exams.  Immunizations.  Screening for certain conditions.  Healthy lifestyle choices, such as eating a healthy diet, getting regular exercise, not using drugs or products that contain nicotine and tobacco, and limiting alcohol use. What can I expect for my preventive care visit? Physical exam Your health care provider will check:  Height and weight. These may be used to calculate body mass index (BMI), which is a measurement that tells if you are at a healthy weight.  Heart rate and blood pressure.  Your skin for abnormal spots. Counseling Your health care provider may ask you questions about:  Alcohol, tobacco, and drug use.  Emotional well-being.  Home and relationship  well-being.  Sexual activity.  Eating habits.  Work and work Statistician. What immunizations do I need?  Influenza (flu) vaccine  This is recommended every year. Tetanus, diphtheria, and pertussis (Tdap) vaccine  You may need a Td booster every 10 years. Varicella (chickenpox) vaccine  You may need this vaccine if you have not already been vaccinated. Zoster (shingles) vaccine  You may need this after age 53. Measles, mumps, and rubella (MMR) vaccine  You may need at least one dose of MMR if you were born in 1957 or later. You may also need a second dose. Pneumococcal conjugate (PCV13) vaccine  You may need this if you have certain conditions and were not previously vaccinated. Pneumococcal polysaccharide (PPSV23) vaccine  You may need one or two doses if you smoke cigarettes or if you have certain conditions. Meningococcal conjugate (MenACWY) vaccine  You may need this if you have certain conditions. Hepatitis A vaccine  You may need this if you have certain conditions or if you travel or work in places where you may be exposed to hepatitis A. Hepatitis B vaccine  You may need this if you have certain conditions or if you travel or work in places where you may be exposed to hepatitis B. Haemophilus influenzae type b (Hib) vaccine  You may need this if you have certain risk factors. Human papillomavirus (HPV) vaccine  If recommended by your health care  provider, you may need three doses over 6 months. You may receive vaccines as individual doses or as more than one vaccine together in one shot (combination vaccines). Talk with your health care provider about the risks and benefits of combination vaccines. What tests do I need? Blood tests  Lipid and cholesterol levels. These may be checked every 5 years, or more frequently if you are over 77 years old.  Hepatitis C test.  Hepatitis B test. Screening  Lung cancer screening. You may have this screening every year  starting at age 68 if you have a 30-pack-year history of smoking and currently smoke or have quit within the past 15 years.  Prostate cancer screening. Recommendations will vary depending on your family history and other risks.  Colorectal cancer screening. All adults should have this screening starting at age 42 and continuing until age 73. Your health care provider may recommend screening at age 31 if you are at increased risk. You will have tests every 1-10 years, depending on your results and the type of screening test.  Diabetes screening. This is done by checking your blood sugar (glucose) after you have not eaten for a while (fasting). You may have this done every 1-3 years.  Sexually transmitted disease (STD) testing. Follow these instructions at home: Eating and drinking  Eat a diet that includes fresh fruits and vegetables, whole grains, lean protein, and low-fat dairy products.  Take vitamin and mineral supplements as recommended by your health care provider.  Do not drink alcohol if your health care provider tells you not to drink.  If you drink alcohol: ? Limit how much you have to 0-2 drinks a day. ? Be aware of how much alcohol is in your drink. In the [place] one drink equals one 12 oz bottle of beer (355 mL), one 5 oz glass of wine (148 mL), or one 1 oz glass of hard liquor (44 mL). Lifestyle  Take daily care of your teeth and gums.  Stay active. Exercise for at least 30 minutes on 5 or more days each week.  Do not use any products that contain nicotine or tobacco, such as cigarettes, e-cigarettes, and chewing tobacco. If you need help quitting, ask your health care provider.  If you are sexually active, practice safe sex. Use a condom or other form of protection to prevent STIs (sexually transmitted infections).  Talk with your health care provider about taking a low-dose aspirin every day starting at age 10. What's next?  Go to your health care provider once a year  for a well check visit.  Ask your health care provider how often you should have your eyes and teeth checked.  Stay up to date on all vaccines. This information is not intended to replace advice given to you by your health care provider. Make sure you discuss any questions you have with your health care provider. Document Revised: 07/22/2018 Document Reviewed: 07/22/2018 Elsevier Patient Education  2020 [employer].

## 2019-11-22 NOTE — Assessment & Plan Note (Signed)
Viagra refilled.  Symptoms are stable.

## 2019-11-22 NOTE — Progress Notes (Signed)
Chief Complaint:  [name] is a 64 y.o. male who presents today for his annual comprehensive physical exam.    Assessment/Plan:  New/Acute Problems: Actinic Keratosis Cryotherapy applied today.  See below procedure note.  Tolerated well.  Chronic Problems Addressed Today: AML (acute myeloid leukemia) in remission [facility]) s/p BMT 2000 Continue management per oncology.  Essential hypertension, benign At goal.  Continue amlodipine 10 mg daily.  ERECTILE DYSFUNCTION, NON-ORGANIC Viagra refilled.  Symptoms are stable.  Dyslipidemia Not currently on Lipitor due to concern for interaction with his medications related to his bone marrow transplant.  We will recheck lipid panel today.  Myelodysplasia (myelodysplastic syndrome) s/p BMT 2020 Continue management per oncology.  Preventative Healthcare: Check PSA and a lipid panel.  Recent labs done via oncology otherwise within normal limits.  Patient Counseling(The following topics were reviewed and/or handout was given):  -Nutrition: Stressed importance of moderation in sodium/caffeine intake, saturated fat and cholesterol, caloric balance, sufficient intake of fresh fruits, vegetables, and fiber.  -Stressed the importance of regular exercise.   -Substance Abuse: Discussed cessation/primary prevention of tobacco, alcohol, or other drug use; driving or other dangerous activities under the influence; availability of treatment for abuse.   -Injury prevention: Discussed safety belts, safety helmets, smoke detector, smoking near bedding or upholstery.   -Sexuality: Discussed sexually transmitted diseases, partner selection, use of condoms, avoidance of unintended pregnancy and contraceptive alternatives.   -Dental health: Discussed importance of regular tooth brushing, flossing, and dental visits.  -Health maintenance and immunizations reviewed. Please refer to Health maintenance section.  Return to care in 1 year for next preventative  visit.     Subjective:  HPI:  He has no acute complaints today.   He has a small raised area on left neck that he would like to have looked at today.  Since her last visit he has underwent bone marrow transplant.  This was due to myelodysplastic syndrome.  He has been going through recovery well and has been following with oncology.  Lifestyle Diet: Balance. Plenty of fruits and vegetables.  Exercise: Does a lot of walking. Likes to go golfing.   Depression screen PHQ 2/9 09/23/2018  Decreased Interest 0  Down, Depressed, Hopeless 0  PHQ - 2 Score 0    Health Maintenance Due  Topic Date Due  . HIV Screening  Never done     ROS: Per HPI, otherwise a complete review of systems was negative.   PMH:  The following were reviewed and entered/updated in epic: Past Medical History:  Diagnosis Date  . Acute myeloid leukemia in remission ([place]) 1999  . Anemia   . Arthritis   . Depression   . Diverticulosis of colon (without mention of hemorrhage)   . Diverticulosis of colon (without mention of hemorrhage) 04/21/2011  . Esophageal reflux   . Family hx colonic polyps   . Family hx colonic polyps 04/21/2011  . Hiatal hernia   . Hiatal hernia 04/21/2011  . History of kidney stones   . Hyperlipemia   . Hypertension   . MDS (myelodysplastic syndrome) ([place])   . Psychosexual dysfunction with inhibited sexual excitement    Patient Active Problem List   Diagnosis Date Noted  . Myelodysplasia (myelodysplastic syndrome) s/p BMT 2020 04/08/2019  . Anemia 04/08/2019  . Seasonal allergies 09/23/2018  . Anxiety 09/23/2018  . History of cold sores 09/23/2018  . AML (acute myeloid leukemia) in remission [facility]) s/p BMT 2000 09/28/2014  . GERD with hiatal hernia 04/21/2011  .  Hiatal hernia 04/21/2011  . ERECTILE DYSFUNCTION, NON-ORGANIC 11/15/2009  . Essential hypertension, benign 06/01/2007  . Dyslipidemia 05/26/2007   Past Surgical History:  Procedure Laterality Date  . SHOULDER  SURGERY     x 3     Family History  Problem Relation Age of Onset  . Hypertension Father   . Heart disease Father   . Prostate cancer Father        dx late 71s  . Cancer Father 18       prostate ca  . Celiac disease Mother   . Colon polyps Mother   . Breast cancer Mother   . Cancer Mother        Bladder  . Colon cancer Neg Hx   . Esophageal cancer Neg Hx   . Inflammatory bowel disease Neg Hx   . Liver disease Neg Hx   . Pancreatic cancer Neg Hx   . Rectal cancer Neg Hx   . Stomach cancer Neg Hx     Medications- reviewed and updated Current Outpatient Medications  Medication Sig Dispense Refill  . amLODipine (NORVASC) 10 MG tablet Take 1 tablet (10 mg total) by mouth daily. 90 tablet 3  . B Complex Vitamins (VITAMIN B COMPLEX) TABS Take by mouth.    . calcium-vitamin D (OSCAL WITH D) 500-200 MG-UNIT TABS tablet Take by mouth.    . cyclobenzaprine (FLEXERIL) 10 MG tablet Take 1 tablet (10 mg total) by mouth 3 (three) times daily as needed for muscle spasms. 30 tablet 0  . EPIPEN 2-PAK 0.3 MG/0.3ML DEVI Inject 0.3 mg into the muscle as needed.     . Nutritional Supplements (JUICE PLUS FIBRE PO) Take by mouth. 2 tabs twice daily    . omeprazole (PRILOSEC) 40 MG capsule Take 1 capsule (40 mg total) by mouth daily. 90 capsule 4  . sertraline (ZOLOFT) 50 MG tablet Take 1 tablet (50 mg total) by mouth daily. 90 tablet 3  . sildenafil (VIAGRA) 100 MG tablet Take 0.5 tablets (50 mg total) by mouth as needed for erectile dysfunction. 30 tablet 6  . Specialty Vitamins Products (MAGNESIUM, AMINO ACID CHELATE,) 133 MG tablet TAKE 2 TABLETS BY MOUTH 3 TIMES DAILY.    Marland Kitchen sulfamethoxazole-trimethoprim (BACTRIM DS) 800-160 MG tablet Take by mouth.    . tacrolimus (PROGRAF) 0.5 MG capsule Taper as directed: 4/12 3 mg bid; 4/19 2 mg bid; 4/26 1.5 mg bid; 5/3 1 mg bid; 5/10 0.5 mg bid; 5/17 0.5 mg daily; last dose 5/23    . valACYclovir (VALTREX) 500 MG tablet Take 1 tablet (500 mg total) by mouth  2 (two) times daily. 1 tablet twice daily for 1 day for each  episode of herpes simplex 20 tablet 2  . atorvastatin (LIPITOR) 40 MG tablet Take 1 tablet (40 mg total) by mouth daily. (Patient not taking: Reported on 11/22/2019) 90 tablet 3  . folic acid (FOLVITE) 1 MG tablet Take 1 mg by mouth daily.    Marland Kitchen oxybutynin (DITROPAN-XL) 10 MG 24 hr tablet Take 10 mg by mouth daily.    . penicillin v potassium (VEETID) 500 MG tablet Take 500 mg by mouth 2 (two) times daily.    Marland Kitchen sulfamethoxazole-trimethoprim (BACTRIM DS) 800-160 MG tablet Take 1 tablet by mouth 3 (three) times a week.     No current facility-administered medications for this visit.    Allergies-reviewed and updated Allergies  Allergen Reactions  . Morphine Other (See Comments)    UNKNOWN - REACTION IS NOT LISTED  .  Marinol [Dronabinol]     Delusions.    Social History   Socioeconomic History  . Marital status: Married    Spouse name: Not on file  . Number of children: 2  . Years of education: Not on file  . Highest education level: Not on file  Occupational History  . Occupation: self employed    Employer: [employer]  Tobacco Use  . Smoking status: Never Smoker  . Smokeless tobacco: Never Used  Substance and Sexual Activity  . Alcohol use: Yes    Comment: one occ   . Drug use: No  . Sexual activity: Never  Other Topics Concern  . Not on file  Social History Narrative   2 caffeine drinks daily    Social Determinants of Health   Financial Resource Strain:   . Difficulty of Paying Living Expenses:   Food Insecurity:   . Worried About Charity fundraiser in the Last Year:   . Arboriculturist in the Last Year:   Transportation Needs:   . Film/video editor (Medical):   Marland Kitchen Lack of Transportation (Non-Medical):   Physical Activity:   . Days of Exercise per Week:   . Minutes of Exercise per Session:   Stress:   . Feeling of Stress :   Social Connections:   . Frequency of Communication with Friends and  Family:   . Frequency of Social Gatherings with Friends and Family:   . Attends Religious Services:   . Active Member of Clubs or Organizations:   . Attends Archivist Meetings:   Marland Kitchen Marital Status:         Objective:  Physical Exam: BP 120/80 (BP Location: Left Arm, Patient Position: Sitting, Cuff Size: Normal)   Pulse 73   Temp 97.7 F (36.5 C)   Ht 5' 9.25" (1.759 m)   Wt 165 lb 6.4 oz (75 kg)   SpO2 99%   BMI 24.25 kg/m   Body mass index is 24.25 kg/m. Wt Readings from Last 3 Encounters:  11/22/19 165 lb 6.4 oz (75 kg)  04/05/19 176 lb (79.8 kg)  01/14/19 172 lb (78 kg)   Gen: NAD, resting comfortably HEENT: TMs normal bilaterally. OP clear. No thyromegaly noted.  CV: RRR with no murmurs appreciated Pulm: NWOB, CTAB with no crackles, wheezes, or rhonchi GI: Normal bowel sounds present. Soft, Nontender, Nondistended. MSK: no edema, cyanosis, or clubbing noted Skin: warm, dry.  Small excoriated approximately 2 to 3 mm lesion on left neck with keratosis Neuro: CN2-12 grossly intact. Strength 5/5 in upper and lower extremities. Reflexes symmetric and intact bilaterally.  Psych: Normal affect and thought content  Cryotherapy Procedure Note  Pre-operative Diagnosis: Actinic keratosis  Locations: Left neck  Indications: Therapeutic  Procedure Details  Patient informed of risks (permanent scarring, infection, light or dark discoloration, bleeding, infection, weakness, numbness and recurrence of the lesion) and benefits of the procedure and verbal informed consent obtained.  The areas are treated with liquid nitrogen therapy, frozen until ice ball extended 3 mm beyond lesion, allowed to thaw, and treated again. The patient tolerated procedure well.  The patient was instructed on post-op care, warned that there may be blister formation, redness and pain. Recommend OTC analgesia as needed for pain.  Condition: Stable  Complications: none.       Algis Greenhouse.  [name], MD 11/22/2019 8:47 AM

## 2019-11-22 NOTE — Assessment & Plan Note (Signed)
At goal  Continue amlodipine 10mg daily

## 2019-11-23 ENCOUNTER — Encounter: Payer: Self-pay | Admitting: Family Medicine

## 2019-11-23 NOTE — Progress Notes (Signed)
Please inform patient of the following:  Cholesterol up slightly since last check but overall still stable.  We can continue to monitor for now.  Would like for him to continue working on diet and exercise and we can recheck in a year.  Do not need to go back on cholesterol medication at this point.  PSA was normal.  [name]. [name], MD 11/23/2019 9:15 AM

## 2019-11-25 ENCOUNTER — Encounter: Payer: Self-pay | Admitting: Family Medicine

## 2019-11-29 ENCOUNTER — Other Ambulatory Visit: Payer: Self-pay

## 2019-11-29 ENCOUNTER — Ambulatory Visit (INDEPENDENT_AMBULATORY_CARE_PROVIDER_SITE_OTHER)
Admission: RE | Admit: 2019-11-29 | Discharge: 2019-11-29 | Disposition: A | Discharge status: Home / Self Care | Payer: BC Managed Care – PPO | Source: Ambulatory Visit | Attending: Family Medicine | Admitting: Family Medicine

## 2019-11-29 DIAGNOSIS — D469 Myelodysplastic syndrome, unspecified: Secondary | ICD-10-CM

## 2019-12-04 DIAGNOSIS — D469 Myelodysplastic syndrome, unspecified: Secondary | ICD-10-CM | POA: Diagnosis not present

## 2019-12-05 ENCOUNTER — Telehealth: Payer: Self-pay | Admitting: Family Medicine

## 2019-12-05 DIAGNOSIS — Z79899 Other long term (current) drug therapy: Secondary | ICD-10-CM | POA: Diagnosis not present

## 2019-12-05 DIAGNOSIS — K529 Noninfective gastroenteritis and colitis, unspecified: Secondary | ICD-10-CM | POA: Diagnosis not present

## 2019-12-05 DIAGNOSIS — N400 Enlarged prostate without lower urinary tract symptoms: Secondary | ICD-10-CM | POA: Diagnosis not present

## 2019-12-05 DIAGNOSIS — Z23 Encounter for immunization: Secondary | ICD-10-CM | POA: Diagnosis not present

## 2019-12-05 DIAGNOSIS — D801 Nonfamilial hypogammaglobulinemia: Secondary | ICD-10-CM | POA: Diagnosis not present

## 2019-12-05 DIAGNOSIS — Z9481 Bone marrow transplant status: Secondary | ICD-10-CM | POA: Diagnosis not present

## 2019-12-05 DIAGNOSIS — R972 Elevated prostate specific antigen [PSA]: Secondary | ICD-10-CM | POA: Diagnosis not present

## 2019-12-05 DIAGNOSIS — C9201 Acute myeloblastic leukemia, in remission: Secondary | ICD-10-CM | POA: Diagnosis not present

## 2019-12-05 DIAGNOSIS — N401 Enlarged prostate with lower urinary tract symptoms: Secondary | ICD-10-CM | POA: Diagnosis not present

## 2019-12-05 DIAGNOSIS — K2950 Unspecified chronic gastritis without bleeding: Secondary | ICD-10-CM | POA: Diagnosis not present

## 2019-12-05 DIAGNOSIS — M8580 Other specified disorders of bone density and structure, unspecified site: Secondary | ICD-10-CM | POA: Diagnosis not present

## 2019-12-05 DIAGNOSIS — Z792 Long term (current) use of antibiotics: Secondary | ICD-10-CM | POA: Diagnosis not present

## 2019-12-05 NOTE — Telephone Encounter (Signed)
Patient is calling in about his bone density test results, asked for a returned phone call.

## 2019-12-05 NOTE — Progress Notes (Signed)
Please inform patient of the following:  He has mild thinning of the bones at his femora neck but overall low risk for major fracture. Recommend he make sure he is getting plenty of calcium and vitamin D. We should recheck in a couple of years.  [name]. [name], MD 12/05/2019 7:56 AM

## 2019-12-06 NOTE — Telephone Encounter (Signed)
Patient given results he voices understanding.Patient notified to take calcium 1200 MG of calcium daily and 1000 units of Vitamin D daily per bone density recommendations.

## 2019-12-14 ENCOUNTER — Other Ambulatory Visit: Payer: Self-pay | Admitting: Family Medicine

## 2019-12-14 NOTE — Telephone Encounter (Signed)
LAST APPOINTMENT DATE: 11/22/2019   NEXT APPOINTMENT DATE: Visit date not found  Rx Zoloft   LAST REFILL:09/23/2018  QTY: 90 3 RF

## 2019-12-19 DIAGNOSIS — Z792 Long term (current) use of antibiotics: Secondary | ICD-10-CM | POA: Diagnosis not present

## 2019-12-19 DIAGNOSIS — D801 Nonfamilial hypogammaglobulinemia: Secondary | ICD-10-CM | POA: Diagnosis not present

## 2019-12-19 DIAGNOSIS — Z9481 Bone marrow transplant status: Secondary | ICD-10-CM | POA: Diagnosis not present

## 2019-12-19 DIAGNOSIS — Z79899 Other long term (current) drug therapy: Secondary | ICD-10-CM | POA: Diagnosis not present

## 2019-12-19 DIAGNOSIS — M8580 Other specified disorders of bone density and structure, unspecified site: Secondary | ICD-10-CM | POA: Diagnosis not present

## 2019-12-19 DIAGNOSIS — C9201 Acute myeloblastic leukemia, in remission: Secondary | ICD-10-CM | POA: Diagnosis not present

## 2019-12-19 DIAGNOSIS — R8279 Other abnormal findings on microbiological examination of urine: Secondary | ICD-10-CM | POA: Diagnosis not present

## 2019-12-19 DIAGNOSIS — D4620 Refractory anemia with excess of blasts, unspecified: Secondary | ICD-10-CM | POA: Diagnosis not present

## 2019-12-21 DIAGNOSIS — D4620 Refractory anemia with excess of blasts, unspecified: Secondary | ICD-10-CM | POA: Diagnosis not present

## 2019-12-21 DIAGNOSIS — Z9481 Bone marrow transplant status: Secondary | ICD-10-CM | POA: Diagnosis not present

## 2019-12-26 DIAGNOSIS — D4620 Refractory anemia with excess of blasts, unspecified: Secondary | ICD-10-CM | POA: Diagnosis not present

## 2019-12-26 DIAGNOSIS — Z9481 Bone marrow transplant status: Secondary | ICD-10-CM | POA: Diagnosis not present

## 2020-01-02 DIAGNOSIS — N400 Enlarged prostate without lower urinary tract symptoms: Secondary | ICD-10-CM | POA: Diagnosis not present

## 2020-01-02 DIAGNOSIS — D4620 Refractory anemia with excess of blasts, unspecified: Secondary | ICD-10-CM | POA: Diagnosis not present

## 2020-01-02 DIAGNOSIS — C9201 Acute myeloblastic leukemia, in remission: Secondary | ICD-10-CM | POA: Diagnosis not present

## 2020-01-02 DIAGNOSIS — R8279 Other abnormal findings on microbiological examination of urine: Secondary | ICD-10-CM | POA: Diagnosis not present

## 2020-01-02 DIAGNOSIS — D469 Myelodysplastic syndrome, unspecified: Secondary | ICD-10-CM | POA: Diagnosis not present

## 2020-01-02 DIAGNOSIS — Z79899 Other long term (current) drug therapy: Secondary | ICD-10-CM | POA: Diagnosis not present

## 2020-01-02 DIAGNOSIS — D801 Nonfamilial hypogammaglobulinemia: Secondary | ICD-10-CM | POA: Diagnosis not present

## 2020-01-02 DIAGNOSIS — Z9481 Bone marrow transplant status: Secondary | ICD-10-CM | POA: Diagnosis not present

## 2020-01-10 DIAGNOSIS — R509 Fever, unspecified: Secondary | ICD-10-CM | POA: Diagnosis not present

## 2020-01-10 DIAGNOSIS — J40 Bronchitis, not specified as acute or chronic: Secondary | ICD-10-CM | POA: Diagnosis not present

## 2020-01-10 DIAGNOSIS — J0140 Acute pansinusitis, unspecified: Secondary | ICD-10-CM | POA: Diagnosis not present

## 2020-01-10 DIAGNOSIS — J302 Other seasonal allergic rhinitis: Secondary | ICD-10-CM | POA: Diagnosis not present

## 2020-01-17 DIAGNOSIS — L570 Actinic keratosis: Secondary | ICD-10-CM | POA: Diagnosis not present

## 2020-01-17 DIAGNOSIS — L814 Other melanin hyperpigmentation: Secondary | ICD-10-CM | POA: Diagnosis not present

## 2020-01-17 DIAGNOSIS — L821 Other seborrheic keratosis: Secondary | ICD-10-CM | POA: Diagnosis not present

## 2020-01-17 DIAGNOSIS — L309 Dermatitis, unspecified: Secondary | ICD-10-CM | POA: Diagnosis not present

## 2020-01-17 DIAGNOSIS — Z85828 Personal history of other malignant neoplasm of skin: Secondary | ICD-10-CM | POA: Diagnosis not present

## 2020-01-23 DIAGNOSIS — J3089 Other allergic rhinitis: Secondary | ICD-10-CM | POA: Diagnosis not present

## 2020-01-23 DIAGNOSIS — D801 Nonfamilial hypogammaglobulinemia: Secondary | ICD-10-CM | POA: Diagnosis not present

## 2020-01-23 DIAGNOSIS — Z9481 Bone marrow transplant status: Secondary | ICD-10-CM | POA: Diagnosis not present

## 2020-01-23 DIAGNOSIS — K219 Gastro-esophageal reflux disease without esophagitis: Secondary | ICD-10-CM | POA: Diagnosis not present

## 2020-01-23 DIAGNOSIS — J301 Allergic rhinitis due to pollen: Secondary | ICD-10-CM | POA: Diagnosis not present

## 2020-01-23 DIAGNOSIS — C9201 Acute myeloblastic leukemia, in remission: Secondary | ICD-10-CM | POA: Diagnosis not present

## 2020-01-23 DIAGNOSIS — R05 Cough: Secondary | ICD-10-CM | POA: Diagnosis not present

## 2020-01-23 DIAGNOSIS — J329 Chronic sinusitis, unspecified: Secondary | ICD-10-CM | POA: Diagnosis not present

## 2020-02-06 DIAGNOSIS — Z9481 Bone marrow transplant status: Secondary | ICD-10-CM | POA: Diagnosis not present

## 2020-02-06 DIAGNOSIS — N400 Enlarged prostate without lower urinary tract symptoms: Secondary | ICD-10-CM | POA: Diagnosis not present

## 2020-02-06 DIAGNOSIS — M8580 Other specified disorders of bone density and structure, unspecified site: Secondary | ICD-10-CM | POA: Diagnosis not present

## 2020-02-06 DIAGNOSIS — Z23 Encounter for immunization: Secondary | ICD-10-CM | POA: Diagnosis not present

## 2020-02-06 DIAGNOSIS — D469 Myelodysplastic syndrome, unspecified: Secondary | ICD-10-CM | POA: Diagnosis not present

## 2020-02-06 DIAGNOSIS — C9201 Acute myeloblastic leukemia, in remission: Secondary | ICD-10-CM | POA: Diagnosis not present

## 2020-02-06 DIAGNOSIS — D4620 Refractory anemia with excess of blasts, unspecified: Secondary | ICD-10-CM | POA: Diagnosis not present

## 2020-02-06 DIAGNOSIS — Z79899 Other long term (current) drug therapy: Secondary | ICD-10-CM | POA: Diagnosis not present

## 2020-02-06 DIAGNOSIS — D801 Nonfamilial hypogammaglobulinemia: Secondary | ICD-10-CM | POA: Diagnosis not present

## 2020-02-20 DIAGNOSIS — Z9481 Bone marrow transplant status: Secondary | ICD-10-CM | POA: Diagnosis not present

## 2020-02-20 DIAGNOSIS — R972 Elevated prostate specific antigen [PSA]: Secondary | ICD-10-CM | POA: Diagnosis not present

## 2020-02-20 DIAGNOSIS — D801 Nonfamilial hypogammaglobulinemia: Secondary | ICD-10-CM | POA: Diagnosis not present

## 2020-02-20 DIAGNOSIS — C9201 Acute myeloblastic leukemia, in remission: Secondary | ICD-10-CM | POA: Diagnosis not present

## 2020-02-27 DIAGNOSIS — N401 Enlarged prostate with lower urinary tract symptoms: Secondary | ICD-10-CM | POA: Diagnosis not present

## 2020-02-27 DIAGNOSIS — N529 Male erectile dysfunction, unspecified: Secondary | ICD-10-CM | POA: Diagnosis not present

## 2020-02-27 DIAGNOSIS — N138 Other obstructive and reflux uropathy: Secondary | ICD-10-CM | POA: Diagnosis not present

## 2020-02-27 DIAGNOSIS — Z8042 Family history of malignant neoplasm of prostate: Secondary | ICD-10-CM | POA: Diagnosis not present

## 2020-03-05 DIAGNOSIS — N182 Chronic kidney disease, stage 2 (mild): Secondary | ICD-10-CM | POA: Diagnosis not present

## 2020-03-05 DIAGNOSIS — I2510 Atherosclerotic heart disease of native coronary artery without angina pectoris: Secondary | ICD-10-CM | POA: Diagnosis not present

## 2020-03-05 DIAGNOSIS — Z9481 Bone marrow transplant status: Secondary | ICD-10-CM | POA: Diagnosis not present

## 2020-03-05 DIAGNOSIS — D4620 Refractory anemia with excess of blasts, unspecified: Secondary | ICD-10-CM | POA: Diagnosis not present

## 2020-03-05 DIAGNOSIS — C9201 Acute myeloblastic leukemia, in remission: Secondary | ICD-10-CM | POA: Diagnosis not present

## 2020-03-05 DIAGNOSIS — I129 Hypertensive chronic kidney disease with stage 1 through stage 4 chronic kidney disease, or unspecified chronic kidney disease: Secondary | ICD-10-CM | POA: Diagnosis not present

## 2020-03-05 DIAGNOSIS — D469 Myelodysplastic syndrome, unspecified: Secondary | ICD-10-CM | POA: Diagnosis not present

## 2020-03-05 DIAGNOSIS — D539 Nutritional anemia, unspecified: Secondary | ICD-10-CM | POA: Diagnosis not present

## 2020-03-05 DIAGNOSIS — D84822 Immunodeficiency due to external causes: Secondary | ICD-10-CM | POA: Diagnosis not present

## 2020-03-06 DIAGNOSIS — Z9481 Bone marrow transplant status: Secondary | ICD-10-CM | POA: Diagnosis not present

## 2020-03-06 DIAGNOSIS — C9201 Acute myeloblastic leukemia, in remission: Secondary | ICD-10-CM | POA: Diagnosis not present

## 2020-03-31 DIAGNOSIS — Z20822 Contact with and (suspected) exposure to covid-19: Secondary | ICD-10-CM | POA: Diagnosis not present

## 2020-03-31 DIAGNOSIS — R0981 Nasal congestion: Secondary | ICD-10-CM | POA: Diagnosis not present

## 2020-04-02 DIAGNOSIS — Z79899 Other long term (current) drug therapy: Secondary | ICD-10-CM | POA: Diagnosis not present

## 2020-04-02 DIAGNOSIS — N400 Enlarged prostate without lower urinary tract symptoms: Secondary | ICD-10-CM | POA: Diagnosis not present

## 2020-04-02 DIAGNOSIS — R05 Cough: Secondary | ICD-10-CM | POA: Diagnosis not present

## 2020-04-02 DIAGNOSIS — M8580 Other specified disorders of bone density and structure, unspecified site: Secondary | ICD-10-CM | POA: Diagnosis not present

## 2020-04-02 DIAGNOSIS — Z9481 Bone marrow transplant status: Secondary | ICD-10-CM | POA: Diagnosis not present

## 2020-04-02 DIAGNOSIS — J069 Acute upper respiratory infection, unspecified: Secondary | ICD-10-CM | POA: Diagnosis not present

## 2020-04-02 DIAGNOSIS — C9201 Acute myeloblastic leukemia, in remission: Secondary | ICD-10-CM | POA: Diagnosis not present

## 2020-04-02 DIAGNOSIS — D4620 Refractory anemia with excess of blasts, unspecified: Secondary | ICD-10-CM | POA: Diagnosis not present

## 2020-04-02 DIAGNOSIS — I2510 Atherosclerotic heart disease of native coronary artery without angina pectoris: Secondary | ICD-10-CM | POA: Diagnosis not present

## 2020-04-04 DIAGNOSIS — H524 Presbyopia: Secondary | ICD-10-CM | POA: Diagnosis not present

## 2020-04-04 DIAGNOSIS — H25043 Posterior subcapsular polar age-related cataract, bilateral: Secondary | ICD-10-CM | POA: Diagnosis not present

## 2020-04-10 DIAGNOSIS — Z20828 Contact with and (suspected) exposure to other viral communicable diseases: Secondary | ICD-10-CM | POA: Diagnosis not present

## 2020-04-30 DIAGNOSIS — R5383 Other fatigue: Secondary | ICD-10-CM | POA: Diagnosis not present

## 2020-04-30 DIAGNOSIS — C9201 Acute myeloblastic leukemia, in remission: Secondary | ICD-10-CM | POA: Diagnosis not present

## 2020-04-30 DIAGNOSIS — D4620 Refractory anemia with excess of blasts, unspecified: Secondary | ICD-10-CM | POA: Diagnosis not present

## 2020-04-30 DIAGNOSIS — Z9481 Bone marrow transplant status: Secondary | ICD-10-CM | POA: Diagnosis not present

## 2020-04-30 DIAGNOSIS — Z79899 Other long term (current) drug therapy: Secondary | ICD-10-CM | POA: Diagnosis not present

## 2020-04-30 DIAGNOSIS — Z23 Encounter for immunization: Secondary | ICD-10-CM | POA: Diagnosis not present

## 2020-05-07 DIAGNOSIS — M9903 Segmental and somatic dysfunction of lumbar region: Secondary | ICD-10-CM | POA: Diagnosis not present

## 2020-05-07 DIAGNOSIS — M542 Cervicalgia: Secondary | ICD-10-CM | POA: Diagnosis not present

## 2020-05-07 DIAGNOSIS — M9901 Segmental and somatic dysfunction of cervical region: Secondary | ICD-10-CM | POA: Diagnosis not present

## 2020-05-07 DIAGNOSIS — M9905 Segmental and somatic dysfunction of pelvic region: Secondary | ICD-10-CM | POA: Diagnosis not present

## 2020-05-07 DIAGNOSIS — M50323 Other cervical disc degeneration at C6-C7 level: Secondary | ICD-10-CM | POA: Diagnosis not present

## 2020-05-07 DIAGNOSIS — M9902 Segmental and somatic dysfunction of thoracic region: Secondary | ICD-10-CM | POA: Diagnosis not present

## 2020-05-07 DIAGNOSIS — M40292 Other kyphosis, cervical region: Secondary | ICD-10-CM | POA: Diagnosis not present

## 2020-05-08 DIAGNOSIS — M9902 Segmental and somatic dysfunction of thoracic region: Secondary | ICD-10-CM | POA: Diagnosis not present

## 2020-05-08 DIAGNOSIS — M9905 Segmental and somatic dysfunction of pelvic region: Secondary | ICD-10-CM | POA: Diagnosis not present

## 2020-05-08 DIAGNOSIS — M9903 Segmental and somatic dysfunction of lumbar region: Secondary | ICD-10-CM | POA: Diagnosis not present

## 2020-05-08 DIAGNOSIS — M9901 Segmental and somatic dysfunction of cervical region: Secondary | ICD-10-CM | POA: Diagnosis not present

## 2020-05-09 DIAGNOSIS — M9905 Segmental and somatic dysfunction of pelvic region: Secondary | ICD-10-CM | POA: Diagnosis not present

## 2020-05-09 DIAGNOSIS — M9903 Segmental and somatic dysfunction of lumbar region: Secondary | ICD-10-CM | POA: Diagnosis not present

## 2020-05-09 DIAGNOSIS — M9901 Segmental and somatic dysfunction of cervical region: Secondary | ICD-10-CM | POA: Diagnosis not present

## 2020-05-09 DIAGNOSIS — M9902 Segmental and somatic dysfunction of thoracic region: Secondary | ICD-10-CM | POA: Diagnosis not present

## 2020-05-10 DIAGNOSIS — M9903 Segmental and somatic dysfunction of lumbar region: Secondary | ICD-10-CM | POA: Diagnosis not present

## 2020-05-10 DIAGNOSIS — M9901 Segmental and somatic dysfunction of cervical region: Secondary | ICD-10-CM | POA: Diagnosis not present

## 2020-05-10 DIAGNOSIS — M9905 Segmental and somatic dysfunction of pelvic region: Secondary | ICD-10-CM | POA: Diagnosis not present

## 2020-05-10 DIAGNOSIS — M9902 Segmental and somatic dysfunction of thoracic region: Secondary | ICD-10-CM | POA: Diagnosis not present

## 2020-05-17 DIAGNOSIS — M40292 Other kyphosis, cervical region: Secondary | ICD-10-CM | POA: Diagnosis not present

## 2020-05-17 DIAGNOSIS — M9902 Segmental and somatic dysfunction of thoracic region: Secondary | ICD-10-CM | POA: Diagnosis not present

## 2020-05-17 DIAGNOSIS — M542 Cervicalgia: Secondary | ICD-10-CM | POA: Diagnosis not present

## 2020-05-17 DIAGNOSIS — M9901 Segmental and somatic dysfunction of cervical region: Secondary | ICD-10-CM | POA: Diagnosis not present

## 2020-05-18 DIAGNOSIS — M9902 Segmental and somatic dysfunction of thoracic region: Secondary | ICD-10-CM | POA: Diagnosis not present

## 2020-05-18 DIAGNOSIS — M9901 Segmental and somatic dysfunction of cervical region: Secondary | ICD-10-CM | POA: Diagnosis not present

## 2020-05-18 DIAGNOSIS — M542 Cervicalgia: Secondary | ICD-10-CM | POA: Diagnosis not present

## 2020-05-18 DIAGNOSIS — M40292 Other kyphosis, cervical region: Secondary | ICD-10-CM | POA: Diagnosis not present

## 2020-05-21 DIAGNOSIS — M40292 Other kyphosis, cervical region: Secondary | ICD-10-CM | POA: Diagnosis not present

## 2020-05-21 DIAGNOSIS — M9901 Segmental and somatic dysfunction of cervical region: Secondary | ICD-10-CM | POA: Diagnosis not present

## 2020-05-21 DIAGNOSIS — M542 Cervicalgia: Secondary | ICD-10-CM | POA: Diagnosis not present

## 2020-05-21 DIAGNOSIS — M9902 Segmental and somatic dysfunction of thoracic region: Secondary | ICD-10-CM | POA: Diagnosis not present

## 2020-05-23 DIAGNOSIS — M9901 Segmental and somatic dysfunction of cervical region: Secondary | ICD-10-CM | POA: Diagnosis not present

## 2020-05-23 DIAGNOSIS — M9902 Segmental and somatic dysfunction of thoracic region: Secondary | ICD-10-CM | POA: Diagnosis not present

## 2020-05-23 DIAGNOSIS — M40292 Other kyphosis, cervical region: Secondary | ICD-10-CM | POA: Diagnosis not present

## 2020-05-23 DIAGNOSIS — M542 Cervicalgia: Secondary | ICD-10-CM | POA: Diagnosis not present

## 2020-05-25 DIAGNOSIS — M9902 Segmental and somatic dysfunction of thoracic region: Secondary | ICD-10-CM | POA: Diagnosis not present

## 2020-05-25 DIAGNOSIS — M9901 Segmental and somatic dysfunction of cervical region: Secondary | ICD-10-CM | POA: Diagnosis not present

## 2020-05-25 DIAGNOSIS — M40292 Other kyphosis, cervical region: Secondary | ICD-10-CM | POA: Diagnosis not present

## 2020-05-25 DIAGNOSIS — M542 Cervicalgia: Secondary | ICD-10-CM | POA: Diagnosis not present

## 2020-05-26 DIAGNOSIS — J029 Acute pharyngitis, unspecified: Secondary | ICD-10-CM | POA: Diagnosis not present

## 2020-06-01 DIAGNOSIS — M9901 Segmental and somatic dysfunction of cervical region: Secondary | ICD-10-CM | POA: Diagnosis not present

## 2020-06-01 DIAGNOSIS — M9902 Segmental and somatic dysfunction of thoracic region: Secondary | ICD-10-CM | POA: Diagnosis not present

## 2020-06-01 DIAGNOSIS — M9903 Segmental and somatic dysfunction of lumbar region: Secondary | ICD-10-CM | POA: Diagnosis not present

## 2020-06-01 DIAGNOSIS — M9905 Segmental and somatic dysfunction of pelvic region: Secondary | ICD-10-CM | POA: Diagnosis not present

## 2020-06-04 DIAGNOSIS — M9905 Segmental and somatic dysfunction of pelvic region: Secondary | ICD-10-CM | POA: Diagnosis not present

## 2020-06-04 DIAGNOSIS — M9901 Segmental and somatic dysfunction of cervical region: Secondary | ICD-10-CM | POA: Diagnosis not present

## 2020-06-04 DIAGNOSIS — M9903 Segmental and somatic dysfunction of lumbar region: Secondary | ICD-10-CM | POA: Diagnosis not present

## 2020-06-04 DIAGNOSIS — M9902 Segmental and somatic dysfunction of thoracic region: Secondary | ICD-10-CM | POA: Diagnosis not present

## 2020-06-05 DIAGNOSIS — M9901 Segmental and somatic dysfunction of cervical region: Secondary | ICD-10-CM | POA: Diagnosis not present

## 2020-06-05 DIAGNOSIS — M9905 Segmental and somatic dysfunction of pelvic region: Secondary | ICD-10-CM | POA: Diagnosis not present

## 2020-06-05 DIAGNOSIS — M9903 Segmental and somatic dysfunction of lumbar region: Secondary | ICD-10-CM | POA: Diagnosis not present

## 2020-06-05 DIAGNOSIS — M9902 Segmental and somatic dysfunction of thoracic region: Secondary | ICD-10-CM | POA: Diagnosis not present

## 2020-06-06 DIAGNOSIS — M9903 Segmental and somatic dysfunction of lumbar region: Secondary | ICD-10-CM | POA: Diagnosis not present

## 2020-06-06 DIAGNOSIS — M9905 Segmental and somatic dysfunction of pelvic region: Secondary | ICD-10-CM | POA: Diagnosis not present

## 2020-06-06 DIAGNOSIS — M9902 Segmental and somatic dysfunction of thoracic region: Secondary | ICD-10-CM | POA: Diagnosis not present

## 2020-06-06 DIAGNOSIS — M9901 Segmental and somatic dysfunction of cervical region: Secondary | ICD-10-CM | POA: Diagnosis not present

## 2020-06-11 DIAGNOSIS — I340 Nonrheumatic mitral (valve) insufficiency: Secondary | ICD-10-CM | POA: Diagnosis not present

## 2020-06-11 DIAGNOSIS — Z79899 Other long term (current) drug therapy: Secondary | ICD-10-CM | POA: Diagnosis not present

## 2020-06-11 DIAGNOSIS — Z9484 Stem cells transplant status: Secondary | ICD-10-CM | POA: Diagnosis not present

## 2020-06-11 DIAGNOSIS — I083 Combined rheumatic disorders of mitral, aortic and tricuspid valves: Secondary | ICD-10-CM | POA: Diagnosis not present

## 2020-06-11 DIAGNOSIS — D469 Myelodysplastic syndrome, unspecified: Secondary | ICD-10-CM | POA: Diagnosis not present

## 2020-06-11 DIAGNOSIS — Z452 Encounter for adjustment and management of vascular access device: Secondary | ICD-10-CM | POA: Diagnosis not present

## 2020-06-11 DIAGNOSIS — N182 Chronic kidney disease, stage 2 (mild): Secondary | ICD-10-CM | POA: Diagnosis not present

## 2020-06-11 DIAGNOSIS — D4620 Refractory anemia with excess of blasts, unspecified: Secondary | ICD-10-CM | POA: Diagnosis not present

## 2020-06-11 DIAGNOSIS — Z8719 Personal history of other diseases of the digestive system: Secondary | ICD-10-CM | POA: Diagnosis not present

## 2020-06-11 DIAGNOSIS — I119 Hypertensive heart disease without heart failure: Secondary | ICD-10-CM | POA: Diagnosis not present

## 2020-06-11 DIAGNOSIS — Z9481 Bone marrow transplant status: Secondary | ICD-10-CM | POA: Diagnosis not present

## 2020-06-11 DIAGNOSIS — C9201 Acute myeloblastic leukemia, in remission: Secondary | ICD-10-CM | POA: Diagnosis not present

## 2020-06-11 DIAGNOSIS — I129 Hypertensive chronic kidney disease with stage 1 through stage 4 chronic kidney disease, or unspecified chronic kidney disease: Secondary | ICD-10-CM | POA: Diagnosis not present

## 2020-06-11 LAB — BASIC METABOLIC PANEL
BUN: 22 — AB (ref 4–21)
CO2: 29 — AB (ref 13–22)
Chloride: 103 (ref 99–108)
Creatinine: 1.2 (ref 0.6–1.3)
Glucose: 119
Potassium: 4 (ref 3.4–5.3)
Sodium: 140 (ref 137–147)

## 2020-06-11 LAB — COMPREHENSIVE METABOLIC PANEL
Albumin: 4.2 (ref 3.5–5.0)
Calcium: 9.4 (ref 8.7–10.7)
GFR calc non Af Amer: 65

## 2020-06-11 LAB — HEPATIC FUNCTION PANEL
ALT: 19 (ref 10–40)
AST: 17 (ref 14–40)
Alkaline Phosphatase: 66 (ref 25–125)
Bilirubin, Total: 0.4

## 2020-06-11 LAB — CBC AND DIFFERENTIAL
HCT: 37 — AB (ref 41–53)
Hemoglobin: 12.7 — AB (ref 13.5–17.5)
Neutrophils Absolute: 3
Platelets: 347 (ref 150–399)
WBC: 5.3

## 2020-06-11 LAB — CBC: RBC: 3.89 (ref 3.87–5.11)

## 2020-06-13 DIAGNOSIS — D4620 Refractory anemia with excess of blasts, unspecified: Secondary | ICD-10-CM | POA: Diagnosis not present

## 2020-06-13 DIAGNOSIS — Z9481 Bone marrow transplant status: Secondary | ICD-10-CM | POA: Diagnosis not present

## 2020-06-14 ENCOUNTER — Encounter: Payer: Self-pay | Admitting: Family Medicine

## 2020-06-15 DIAGNOSIS — Z949 Transplanted organ and tissue status, unspecified: Secondary | ICD-10-CM | POA: Diagnosis not present

## 2020-06-15 DIAGNOSIS — Z9481 Bone marrow transplant status: Secondary | ICD-10-CM | POA: Diagnosis not present

## 2020-06-15 DIAGNOSIS — Z79899 Other long term (current) drug therapy: Secondary | ICD-10-CM | POA: Diagnosis not present

## 2020-06-15 DIAGNOSIS — I517 Cardiomegaly: Secondary | ICD-10-CM | POA: Diagnosis not present

## 2020-06-15 DIAGNOSIS — Z5181 Encounter for therapeutic drug level monitoring: Secondary | ICD-10-CM | POA: Diagnosis not present

## 2020-06-15 DIAGNOSIS — D4620 Refractory anemia with excess of blasts, unspecified: Secondary | ICD-10-CM | POA: Diagnosis not present

## 2020-06-26 DIAGNOSIS — J3089 Other allergic rhinitis: Secondary | ICD-10-CM | POA: Diagnosis not present

## 2020-06-26 DIAGNOSIS — R059 Cough, unspecified: Secondary | ICD-10-CM | POA: Diagnosis not present

## 2020-06-26 DIAGNOSIS — J301 Allergic rhinitis due to pollen: Secondary | ICD-10-CM | POA: Diagnosis not present

## 2020-06-26 DIAGNOSIS — K219 Gastro-esophageal reflux disease without esophagitis: Secondary | ICD-10-CM | POA: Diagnosis not present

## 2020-07-17 ENCOUNTER — Telehealth: Payer: Self-pay

## 2020-07-17 NOTE — Telephone Encounter (Signed)
Please advise 

## 2020-07-17 NOTE — Telephone Encounter (Signed)
Pt called stating he recently changed his insurance to Medicare. Pt has been seeing a physical therapist for neck pain. Pt has been seeing Dr. Barbaraann Barthel. Pt asked if Dr. [name] could put in a new referral due to needing an updated one for insurance. Please advise.

## 2020-07-18 NOTE — Telephone Encounter (Signed)
Ok with me. Please place any necessary orders. 

## 2020-07-19 ENCOUNTER — Other Ambulatory Visit: Payer: Self-pay | Admitting: *Deleted

## 2020-07-19 DIAGNOSIS — M542 Cervicalgia: Secondary | ICD-10-CM

## 2020-07-19 NOTE — Telephone Encounter (Signed)
Referral placed.

## 2020-08-02 ENCOUNTER — Telehealth (INDEPENDENT_AMBULATORY_CARE_PROVIDER_SITE_OTHER): Payer: Medicare Other | Admitting: Family Medicine

## 2020-08-02 ENCOUNTER — Other Ambulatory Visit: Payer: Self-pay

## 2020-08-02 DIAGNOSIS — R059 Cough, unspecified: Secondary | ICD-10-CM

## 2020-08-02 MED ORDER — BENZONATATE 100 MG PO CAPS: 200.0000 mg | ORAL_CAPSULE | Freq: Two times a day (BID) | ORAL | 0 refills | Status: DC | PRN

## 2020-08-02 NOTE — Patient Instructions (Signed)
-  I sent the medication(s) we discussed to your pharmacy: Meds ordered this encounter  Medications  . benzonatate (TESSALON PERLES) 100 MG capsule    Sig: Take 2 capsules (200 mg total) by mouth 2 (two) times daily as needed.    Dispense:  20 capsule    Refill:  0   Can try nasal saline, humidifier as well.  Start flonase 2 sprays each  Nostril daily for 2 weeks.  I hope you are feeling better soon!  Seek in person care promptly if your symptoms worsen, new concerns arise or you are not improving with treatment over the next several days.  It was nice to meet you today. I help [place] out with telemedicine visits on Tuesdays and Thursdays and am available for visits on those days. If you have any concerns or questions following this visit please schedule a follow up visit with your Primary Care doctor or seek care at a local urgent care clinic to avoid delays in care.

## 2020-08-02 NOTE — Progress Notes (Signed)
Virtual Visit via Video Note  I connected with [name]  on 08/02/20 at 10:20 AM EST by a video enabled telemedicine application and verified that I am speaking with the correct person using two identifiers.  Location patient: home, [place] Location provider:work or home office Persons participating in the virtual visit: patient, provider  I discussed the limitations of evaluation and management by telemedicine and the availability of in person appointments. The patient expressed understanding and agreed to proceed.   HPI:  Acute telemedicine visit for cough: -Onset:1 month ago with VRI that then turned into sinusitis -treated and is doing much better and sinus issues have resolved, but has persistent intermittent tickle in the throat that results in cough - worse at night -Symptoms include: no other symptoms -Denies:cp, sob, fever, sinus issues any longer, thick or excessive phlegm, malaise -Has tried:sudafed -Pertinent past medical history: AML (in remission per patient) -had virus that turned into a sinus infection and was started on augmentin 2 weeks ago  -Pertinent medication allergies:see chart   ROS: See pertinent positives and negatives per HPI.  Past Medical History:  Diagnosis Date  . Acute myeloid leukemia in remission ([place]) 1999  . Anemia   . Arthritis   . Depression   . Diverticulosis of colon (without mention of hemorrhage)   . Diverticulosis of colon (without mention of hemorrhage) 04/21/2011  . Esophageal reflux   . Family hx colonic polyps   . Family hx colonic polyps 04/21/2011  . Hiatal hernia   . Hiatal hernia 04/21/2011  . History of kidney stones   . Hyperlipemia   . Hypertension   . MDS (myelodysplastic syndrome) ([place])   . Psychosexual dysfunction with inhibited sexual excitement     Past Surgical History:  Procedure Laterality Date  . SHOULDER SURGERY     x 3      Current Outpatient Medications:  .  amLODipine (NORVASC) 10 MG tablet, Take 1 tablet (10  mg total) by mouth daily., Disp: 90 tablet, Rfl: 3 .  atorvastatin (LIPITOR) 40 MG tablet, Take 1 tablet (40 mg total) by mouth daily. (Patient not taking: Reported on 11/22/2019), Disp: 90 tablet, Rfl: 3 .  B Complex Vitamins (VITAMIN B COMPLEX) TABS, Take by mouth., Disp: , Rfl:  .  benzonatate (TESSALON PERLES) 100 MG capsule, Take 2 capsules (200 mg total) by mouth 2 (two) times daily as needed., Disp: 20 capsule, Rfl: 0 .  calcium-vitamin D (OSCAL WITH D) 500-200 MG-UNIT TABS tablet, Take by mouth., Disp: , Rfl:  .  cyclobenzaprine (FLEXERIL) 10 MG tablet, Take 1 tablet (10 mg total) by mouth 3 (three) times daily as needed for muscle spasms., Disp: 30 tablet, Rfl: 0 .  EPIPEN 2-PAK 0.3 MG/0.3ML DEVI, Inject 0.3 mg into the muscle as needed. , Disp: , Rfl:  .  folic acid (FOLVITE) 1 MG tablet, Take 1 mg by mouth daily., Disp: , Rfl:  .  Nutritional Supplements (JUICE PLUS FIBRE PO), Take by mouth. 2 tabs twice daily, Disp: , Rfl:  .  omeprazole (PRILOSEC) 40 MG capsule, Take 1 capsule (40 mg total) by mouth daily., Disp: 90 capsule, Rfl: 4 .  oxybutynin (DITROPAN-XL) 10 MG 24 hr tablet, Take 10 mg by mouth daily., Disp: , Rfl:  .  penicillin v potassium (VEETID) 500 MG tablet, Take 500 mg by mouth 2 (two) times daily., Disp: , Rfl:  .  sertraline (ZOLOFT) 50 MG tablet, TAKE 1 TABLET BY MOUTH DAILY, Disp: 84 tablet, Rfl: 0 .  sildenafil (VIAGRA) 100 MG tablet, Take 0.5 tablets (50 mg total) by mouth as needed for erectile dysfunction., Disp: 30 tablet, Rfl: 6 .  Specialty Vitamins Products (MAGNESIUM, AMINO ACID CHELATE,) 133 MG tablet, TAKE 2 TABLETS BY MOUTH 3 TIMES DAILY., Disp: , Rfl:  .  sulfamethoxazole-trimethoprim (BACTRIM DS) 800-160 MG tablet, Take by mouth., Disp: , Rfl:  .  sulfamethoxazole-trimethoprim (BACTRIM DS) 800-160 MG tablet, Take 1 tablet by mouth 3 (three) times a week., Disp: , Rfl:  .  tacrolimus (PROGRAF) 0.5 MG capsule, Taper as directed: 4/12 3 mg bid; 4/19 2 mg bid;  4/26 1.5 mg bid; 5/3 1 mg bid; 5/10 0.5 mg bid; 5/17 0.5 mg daily; last dose 5/23, Disp: , Rfl:  .  valACYclovir (VALTREX) 500 MG tablet, Take 1 tablet (500 mg total) by mouth 2 (two) times daily. 1 tablet twice daily for 1 day for each  episode of herpes simplex, Disp: 20 tablet, Rfl: 2  EXAM:  VITALS per patient if applicable:  GENERAL: alert, oriented, appears well and in no acute distress  HEENT: atraumatic, conjunttiva clear, no obvious abnormalities on inspection of external nose and ears  NECK: normal movements of the head and neck  LUNGS: on inspection no signs of respiratory distress, breathing rate appears normal, no obvious gross SOB, gasping or wheezing  CV: no obvious cyanosis  MS: moves all visible extremities without noticeable abnormality  PSYCH/NEURO: pleasant and cooperative, no obvious depression or anxiety, speech and thought processing grossly intact  ASSESSMENT AND PLAN:  Discussed the following assessment and plan:  Cough  -we discussed possible serious and likely etiologies, options for evaluation and workup, limitations of telemedicine visit vs in person visit, treatment, treatment risks and precautions. Pt prefers to treat via telemedicine empirically rather than in person at this moment. Query post viral/infectious pnd/cough vs other. However, advised if not responding quickly to trial of treatment will need inperson evaluation to r/o other causes.  He opted to try flonase 2 sprays each nostril daily, nasal saline,tessalon and seek inperson care if worsening, new symptoms arise, or if is not improving with treatment over the next few days. Discussed options for inperson care if PCP office not available. Did let this patient know that I only do telemedicine on Tuesdays and Thursdays for [place]. Advised to schedule follow up visit with PCP or [facility] if any further questions or concerns to avoid delays in care.   I discussed the assessment and treatment plan with  the patient. The patient was provided an opportunity to ask questions and all were answered. The patient agreed with the plan and demonstrated an understanding of the instructions.     Lucretia Kern, DO

## 2020-08-06 ENCOUNTER — Other Ambulatory Visit: Payer: Medicare Other

## 2020-08-06 DIAGNOSIS — Z20822 Contact with and (suspected) exposure to covid-19: Secondary | ICD-10-CM

## 2020-08-08 ENCOUNTER — Ambulatory Visit (INDEPENDENT_AMBULATORY_CARE_PROVIDER_SITE_OTHER): Payer: Medicare Other | Admitting: Family Medicine

## 2020-08-08 ENCOUNTER — Encounter: Payer: Self-pay | Admitting: Family Medicine

## 2020-08-08 ENCOUNTER — Other Ambulatory Visit: Payer: Self-pay

## 2020-08-08 VITALS — BP 128/74 | HR 73 | Temp 97.7°F | Ht 69.25 in | Wt 174.2 lb

## 2020-08-08 DIAGNOSIS — J302 Other seasonal allergic rhinitis: Secondary | ICD-10-CM | POA: Diagnosis not present

## 2020-08-08 DIAGNOSIS — C9201 Acute myeloblastic leukemia, in remission: Secondary | ICD-10-CM | POA: Diagnosis not present

## 2020-08-08 LAB — SARS-COV-2, NAA 2 DAY TAT

## 2020-08-08 LAB — NOVEL CORONAVIRUS, NAA: SARS-CoV-2, NAA: NOT DETECTED

## 2020-08-08 MED ORDER — AZELASTINE HCL 0.1 % NA SOLN: 2.0000 | Freq: Two times a day (BID) | NASAL | 12 refills | Status: DC

## 2020-08-08 MED ORDER — DOXYCYCLINE HYCLATE 100 MG PO TABS: 100.0000 mg | ORAL_TABLET | Freq: Two times a day (BID) | ORAL | 0 refills | Status: DC

## 2020-08-08 MED ORDER — GUAIFENESIN-CODEINE 100-10 MG/5ML PO SOLN
5.0000 mL | Freq: Three times a day (TID) | ORAL | 0 refills | Status: DC | PRN
Start: 2020-08-08 — End: 2020-11-27

## 2020-08-08 NOTE — Patient Instructions (Signed)
It was very nice to see you today!  I think you have a sinus infection.  Please start the doxycycline.  Please use the Astelin and cough syrup as needed as well.  Let me know if not improving by next week.  Take care, Dr [name]  Please try these tips to maintain a healthy lifestyle:   Eat at least 3 REAL meals and 1-2 snacks per day.  Aim for no more than 5 hours between eating.  If you eat breakfast, please do so within one hour of getting up.    Each meal should contain half fruits/vegetables, one quarter protein, and one quarter carbs (no bigger than a computer mouse)   Cut down on sweet beverages. This includes juice, soda, and sweet tea.     Drink at least 1 glass of water with each meal and aim for at least 8 glasses per day   Exercise at least 150 minutes every week.

## 2020-08-08 NOTE — Assessment & Plan Note (Signed)
Possibly contributing to sinus infection.  He will continue taking Flonase and will add on Astelin.

## 2020-08-08 NOTE — Progress Notes (Signed)
   [name] is a 64 y.o. male who presents today for an office visit.  Assessment/Plan:  New/Acute Problems: Cough / Sinusitis No signs of bronchitis or pneumonia.  Symptoms likely secondary to sinusitis.  We will start doxycycline.  Also start Astelin.  Will treat cough with guaifenesin-codeine cough syrup.  Encourage good oral hydration.  If not improving by next week will need CT scan or referral to ENT.  Chronic Problems Addressed Today: AML (acute myeloid leukemia) in remission [name]) s/p BMT 2000 Managed by oncology.  He is due for shingles vaccine and is taking acyclovir until then.  Advised him to check with insurance to see where he can get this covered.  Seasonal allergies Possibly contributing to sinus infection.  He will continue taking Flonase and will add on Astelin.    Subjective:  HPI:  Patient here for persistent cough for the past 4 weeks or so.  He saw his oncologist about 3 weeks ago.  Had respiratory viral panel done at that time which was positive for rhinovirus.  Was having a lot of productive cough that time.  A few days later started developing more facial pressure and went to urgent care.  Was diagnosed with sinus infection and started on Augmentin.  The facial pressure improved for a few days but then have returned over the last couple of days.  No fevers or chills.  Cough has been persistent but is now more of a dry cough.  Minimal sputum production.  He has tried Psychiatrist which has not helped.  No shortness of breath.  No chest pain.       Objective:  Physical Exam: BP 128/74   Pulse 73   Temp 97.7 F (36.5 C) (Temporal)   Ht 5' 9.25" (1.759 m)   Wt 174 lb 3.2 oz (79 kg)   SpO2 100%   BMI 25.54 kg/m   Gen: No acute distress, resting comfortably HEENT: TMs with clear effusion. OP clear.  CV: Regular rate and rhythm with no murmurs appreciated Pulm: Normal work of breathing, clear to auscultation bilaterally with no crackles, wheezes, or  rhonchi Neuro: Grossly normal, moves all extremities Psych: Normal affect and thought content      Cordae Mccarey M. [name], MD 08/08/2020 11:04 AM

## 2020-08-08 NOTE — Assessment & Plan Note (Signed)
Managed by oncology.  He is due for shingles vaccine and is taking acyclovir until then.  Advised him to check with insurance to see where he can get this covered.

## 2020-09-12 ENCOUNTER — Telehealth: Payer: Self-pay | Admitting: Family Medicine

## 2020-09-12 NOTE — Telephone Encounter (Signed)
Please have Dr. [name] sign the eval and PT orders from [facility], for this pt.   She states that she has faxed them but has never gotten anything back.  Please advise

## 2020-09-12 NOTE — Telephone Encounter (Signed)
See below

## 2020-09-17 NOTE — Telephone Encounter (Signed)
Left message to refax form

## 2020-09-26 ENCOUNTER — Other Ambulatory Visit: Payer: Self-pay | Admitting: Orthopedic Surgery

## 2020-09-26 DIAGNOSIS — M25562 Pain in left knee: Secondary | ICD-10-CM

## 2020-10-01 ENCOUNTER — Ambulatory Visit
Admission: RE | Admit: 2020-10-01 | Discharge: 2020-10-01 | Disposition: A | Discharge status: Home / Self Care | Payer: Medicare Other | Source: Ambulatory Visit | Attending: Orthopedic Surgery | Admitting: Orthopedic Surgery

## 2020-10-01 ENCOUNTER — Other Ambulatory Visit: Payer: Self-pay

## 2020-10-01 DIAGNOSIS — M25562 Pain in left knee: Secondary | ICD-10-CM

## 2020-10-10 ENCOUNTER — Encounter: Payer: Self-pay | Admitting: Family Medicine

## 2020-10-13 ENCOUNTER — Other Ambulatory Visit: Payer: Self-pay | Admitting: *Deleted

## 2020-10-13 DIAGNOSIS — Z8249 Family history of ischemic heart disease and other diseases of the circulatory system: Secondary | ICD-10-CM

## 2020-10-21 NOTE — Progress Notes (Signed)
CARDIOLOGY CONSULT NOTE       Patient ID: [name] MRN: [id number] DOB/AGE: 11/10/1955 65 y.o.  Admit date: (Not on file) Referring Physician: [name] Primary Physician: [name], MD Primary Cardiologist: New Reason for Consultation: CAD  Active Problems:   * No active hospital problems. *   HPI: 65 y.o. referred by Dr [name] for family history of CAD, HTN and chronic systolic CHF   History of AML with BMT in 2000.  Diagnosed with MDS in 2020 and had repeat transplant 06/08/19  Post 2nd tranplant had pulmonary edema ? From moderate MR not clear what EF was at that time although notes indicate patient concerned recently that EF had gone done from 50-55% and his HR has been running higher He has seen [facility] cardiology [facility] and there note indicates starting ACE 06/15/20 with plan to add beta blocker and stopped norvasc.    History of sinusitis and allergies He is on statin for HLD His last LDL in epic was 130 11/22/19 with lipitor dose of 40 mg daily    Echo done for heart murmur 09/14/20 showed EF 45-50% GLS -9.6% global hypokinesis with mild MR and normal RV function Indicated no change since echo done 06/11/20 He has been Rx only with lisinopril   He has significant knee issues with medial meniscus tear and may need orthopedic surgery in July with NOrris Has had right side done before and will need left done arthroscopically   Denies edema. 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 Has a son and daughter working with him All of them went to [employer]. Enjoys gold and has 3 grand kids.  Met his wife of over 68 years at [employer] as well   ROS All other systems reviewed and negative except as noted above  Past Medical History:   Diagnosis Date   Acute myeloid leukemia in remission ([place]) 1999   Anemia    Arthritis    Depression    Diverticulosis of colon (without mention of hemorrhage)    Diverticulosis of colon (without mention of hemorrhage) 04/21/2011   Esophageal reflux    Family hx colonic polyps    Family hx colonic polyps 04/21/2011   Hiatal hernia    Hiatal hernia 04/21/2011   History of kidney stones    Hyperlipemia    Hypertension    MDS (myelodysplastic syndrome) ([place])    Psychosexual dysfunction with inhibited sexual excitement     Family History  Problem Relation Age of Onset   Hypertension Father    Heart disease Father    Prostate cancer Father        dx late 8s   Cancer Father 29       prostate ca   Celiac disease Mother    Colon polyps Mother    Breast cancer Mother    Cancer Mother        Bladder   Colon cancer Neg Hx    Esophageal cancer Neg Hx    Inflammatory bowel disease Neg Hx    Liver disease Neg Hx    Pancreatic cancer Neg Hx    Rectal cancer Neg Hx    Stomach cancer Neg Hx     Social History   Socioeconomic  History   Marital status: Married    Spouse name: Not on file   Number of children: 2   Years of education: Not on file   Highest education level: Not on file  Occupational History   Occupation: self employed    Employer: [employer]  Tobacco Use   Smoking status: Never Smoker   Smokeless tobacco: Never Used  Scientific laboratory technician Use: Never used  Substance and Sexual Activity   Alcohol use: Yes    Comment: one occ    Drug use: No   Sexual activity: Never  Other Topics Concern   Not on file  Social History Narrative   2 caffeine drinks daily    Social Determinants of Health   Financial Resource Strain: Not on file  Food Insecurity: Not on file  Transportation Needs: Not on file  Physical Activity: Not on file  Stress: Not on file  Social Connections: Not on file  Intimate Partner Violence: Not on  file    Past Surgical History:  Procedure Laterality Date   SHOULDER SURGERY     x 3       Current Outpatient Medications:    atorvastatin (LIPITOR) 40 MG tablet, Take 1 tablet (40 mg total) by mouth daily., Disp: 90 tablet, Rfl: 3   azelastine (ASTELIN) 0.1 % nasal spray, Place 2 sprays into both nostrils 2 (two) times daily., Disp: 30 mL, Rfl: 12   B Complex Vitamins (VITAMIN B COMPLEX) TABS, Take by mouth., Disp: , Rfl:    benzonatate (TESSALON PERLES) 100 MG capsule, Take 2 capsules (200 mg total) by mouth 2 (two) times daily as needed., Disp: 20 capsule, Rfl: 0   calcium-vitamin D (OSCAL WITH D) 500-200 MG-UNIT TABS tablet, Take by mouth., Disp: , Rfl:    doxycycline (VIBRA-TABS) 100 MG tablet, Take 1 tablet (100 mg total) by mouth 2 (two) times daily., Disp: 20 tablet, Rfl: 0   EPIPEN 2-PAK 0.3 MG/0.3ML DEVI, Inject 0.3 mg into the muscle as needed. , Disp: , Rfl:    folic acid (FOLVITE) 1 MG tablet, Take 1 mg by mouth daily., Disp: , Rfl:    guaiFENesin-codeine 100-10 MG/5ML syrup, Take 5 mLs by mouth 3 (three) times daily as needed for cough., Disp: 120 mL, Rfl: 0   Nutritional Supplements (JUICE PLUS FIBRE PO), Take by mouth. 2 tabs twice daily, Disp: , Rfl:    omeprazole (PRILOSEC) 40 MG capsule, Take 1 capsule (40 mg total) by mouth daily., Disp: 90 capsule, Rfl: 4   sertraline (ZOLOFT) 50 MG tablet, TAKE 1 TABLET BY MOUTH DAILY, Disp: 84 tablet, Rfl: 0   sildenafil (VIAGRA) 100 MG tablet, Take 0.5 tablets (50 mg total) by mouth as needed for erectile dysfunction., Disp: 30 tablet, Rfl: 6   valACYclovir (VALTREX) 500 MG tablet, Take 1 tablet (500 mg total) by mouth 2 (two) times daily. 1 tablet twice daily for 1 day for each  episode of herpes simplex, Disp: 20 tablet, Rfl: 2    Physical Exam: There were no vitals taken for this visit.   Affect appropriate Healthy:  appears stated age 65: normal Neck supple with no adenopathy JVP normal no bruits no  thyromegaly Lungs clear with no wheezing and good diaphragmatic motion Heart:  S1/S2 no murmur, no rub, gallop or click PMI normal Abdomen: benighn, BS positve, no tenderness, no AAA no bruit.  No HSM or HJR Distal pulses intact with no bruits No edema Neuro non-focal Skin warm and dry  No muscular weakness   Labs:   Lab Results  Component Value Date   WBC 5.3 06/11/2020   HGB 12.7 (A) 06/11/2020   HCT 37 (A) 06/11/2020   MCV 104.0 (H) 03/10/2019   PLT 347 06/11/2020   No results for input(s): NA, K, CL, CO2, BUN, CREATININE, CALCIUM, PROT, BILITOT, ALKPHOS, ALT, AST, GLUCOSE in the last 168 hours.  Invalid input(s): LABALBU Lab Results  Component Value Date   CKTOTAL 210 03/18/2012   CKMB 3.9 03/18/2012   TROPONINI <0.30 03/18/2012    Lab Results  Component Value Date   CHOL 204 (H) 11/22/2019   CHOL 176 10/27/2018   CHOL 181 10/21/2017   Lab Results  Component Value Date   HDL 48.40 11/22/2019   HDL 61.00 10/27/2018   HDL 64.50 10/21/2017   Lab Results  Component Value Date   LDLCALC 130 (H) 11/22/2019   LDLCALC 98 10/27/2018   LDLCALC 98 10/21/2017   Lab Results  Component Value Date   TRIG 128.0 11/22/2019   TRIG 84.0 10/27/2018   TRIG 95.0 10/21/2017   Lab Results  Component Value Date   CHOLHDL 4 11/22/2019   CHOLHDL 3 10/27/2018   CHOLHDL 3 10/21/2017   Lab Results  Component Value Date   LDLDIRECT 146.2 07/29/2012   LDLDIRECT 136.9 05/12/2011   LDLDIRECT 134.5 01/29/2010      Radiology: MR KNEE LEFT WO CONTRAST  Result Date: 10/01/2020 CLINICAL DATA:  Chronic medial knee pain. No injury or prior surgery. EXAM: MRI OF THE LEFT KNEE WITHOUT CONTRAST TECHNIQUE: Multiplanar, multisequence MR imaging of the knee was performed. No intravenous contrast was administered. COMPARISON:  None. FINDINGS: MENISCI Medial meniscus: Tiny oblique undersurface tear of the midbody. Tiny radial tear of the posterior horn. Lateral meniscus:  Intact. LIGAMENTS  Cruciates:  Intact ACL and PCL. Collaterals: Medial collateral ligament is intact. Lateral collateral ligament complex is intact. CARTILAGE Patellofemoral:  No chondral defect. Medial:  Mild partial-thickness cartilage loss. Lateral:  No chondral defect. Joint:  Small joint effusion.  Normal Hoffa's fat. Popliteal Fossa:  Tiny Baker cyst.  Intact popliteus tendon. Extensor Mechanism: Intact quadriceps tendon and patellar tendon. Intact medial and lateral patellar retinaculum. Intact MPFL. Bones: No focal marrow signal abnormality. No fracture or dislocation. Other: None. IMPRESSION: 1. Tiny oblique undersurface tear of the medial meniscus midbody. Additional tiny radial tear of the posterior horn. 2. Early medial compartment osteoarthritis. 3. Small joint effusion. Tiny Baker cyst. Electronically Signed   By: Titus Dubin M.D.   On: 10/01/2020 10:03    EKG: 2014 SR LAFB LVH  11/01/2020 NSR LAFB LVH no changes    ASSESSMENT AND PLAN:   1. Family History CAD:  See below 2. HLD:  On statin  3. AML/BMT:  Stable post transplant x 2 4. Chronic Systolic CHF: ? Related to Rx for cancer CTA heart r/o CAD. Change lisinopril to entresto and f/u Pharm D to titrate with BMET. Will then start coreg 3.125 bid. Consider f/u cardiac MRI of more accurate EF in 6 months   F/U 6-8 weeks after Pharm D visit   Signed: Jenkins Rouge 10/21/2020, 11:55 AM

## 2020-11-01 ENCOUNTER — Ambulatory Visit (INDEPENDENT_AMBULATORY_CARE_PROVIDER_SITE_OTHER): Payer: Medicare Other | Admitting: Cardiovascular Disease

## 2020-11-01 ENCOUNTER — Encounter: Payer: Self-pay | Admitting: Cardiovascular Disease

## 2020-11-01 ENCOUNTER — Other Ambulatory Visit: Payer: Self-pay

## 2020-11-01 VITALS — BP 130/90 | HR 79 | Ht 70.0 in | Wt 175.0 lb

## 2020-11-01 DIAGNOSIS — E785 Hyperlipidemia, unspecified: Secondary | ICD-10-CM

## 2020-11-01 DIAGNOSIS — I429 Cardiomyopathy, unspecified: Secondary | ICD-10-CM

## 2020-11-01 DIAGNOSIS — I10 Essential (primary) hypertension: Secondary | ICD-10-CM | POA: Diagnosis not present

## 2020-11-01 MED ORDER — ENTRESTO 24-26 MG PO TABS: 1.0000 | ORAL_TABLET | Freq: Two times a day (BID) | ORAL | 11 refills | Status: DC

## 2020-11-01 MED ORDER — METOPROLOL TARTRATE 100 MG PO TABS: ORAL_TABLET | ORAL | 0 refills | Status: DC

## 2020-11-01 NOTE — Patient Instructions (Addendum)
Medication Instructions:  Your physician has recommended you make the following change in your medication:  1-STOP Lisinopril  2-START Entresto 24/26 mg by mouth twice daily, start on Saturday  *If you need a refill on your cardiac medications before your next appointment, please call your pharmacy*  Lab Work: Your physician recommends that you return for lab work in: 3 weeks for BMET  If you have labs (blood work) drawn today and your tests are completely normal, you will receive your results only by: Marland Kitchen MyChart Message (if you have MyChart) OR . A paper copy in the mail If you have any lab test that is abnormal or we need to change your treatment, we will call you to review the results.  Testing/Procedures: Your physician has requested that you have cardiac CT. Cardiac computed tomography (CT) is a painless test that uses an x-ray machine to take clear, detailed pictures of your heart. For further information please visit [email or web address]. Please follow instruction sheet as given.  Follow-Up: At [facility], you and your health needs are our priority.  As part of our continuing mission to provide you with exceptional heart care, we have created designated Provider Care Teams.  These Care Teams include your primary Cardiologist (physician) and Advanced Practice Providers (APPs -  Physician Assistants and Nurse Practitioners) who all work together to provide you with the care you need, when you need it.  We recommend signing up for the patient portal called "MyChart".  Sign up information is provided on this After Visit Summary.  MyChart is used to connect with patients for Virtual Visits (Telemedicine).  Patients are able to view lab/test results, encounter notes, upcoming appointments, etc.  Non-urgent messages can be sent to your provider as well.   To learn more about what you can do with MyChart, go to [email or web address].    Your next appointment:   2 month(s)  The format  for your next appointment:   In Person  Provider:   You may see Dr. Johnsie Cancel or one of the following Advanced Practice Providers on your designated Care Team:    Kathyrn Drown, NP   Your physician recommends that you schedule a follow-up appointment in: 3 weeks with Pharmacist at our practice.   Your cardiac CT will be scheduled at one of the below locations:   [facility] [address] [place], [place] [address] [phone]  If scheduled at [facility], please arrive at the [facility] main entrance (entrance A) of [facility] 30 minutes prior to test start time. Proceed to the [facility] Radiology Department (first floor) to check-in and test prep.    Please follow these instructions carefully (unless otherwise directed):  Hold all erectile dysfunction medications at least 3 days (72 hrs) prior to test.  On the Night Before the Test: . Be sure to Drink plenty of water. . Do not consume any caffeinated/decaffeinated beverages or chocolate 12 hours prior to your test. . Do not take any antihistamines 12 hours prior to your test.  On the Day of the Test: . Drink plenty of water until 1 hour prior to the test. . Do not eat any food 4 hours prior to the test. . You may take your regular medications prior to the test.  . Take metoprolol (Lopressor) 100 mg two hours prior to test.   After the Test: . Drink plenty of water. . After receiving IV contrast, you may experience a mild flushed feeling. This is normal. .  On occasion, you may experience a mild rash up to 24 hours after the test. This is not dangerous. If this occurs, you can take Benadryl 25 mg and increase your fluid intake. . If you experience trouble breathing, this can be serious. If it is severe call 911 IMMEDIATELY. If it is mild, please call our office.   Once we have confirmed authorization from your insurance company, we will call you to set up a date and time for your test.  Based on how quickly your insurance processes prior authorizations requests, please allow up to 4 weeks to be contacted for scheduling your Cardiac CT appointment. Be advised that routine Cardiac CT appointments could be scheduled as many as 8 weeks after your provider has ordered it.  For non-scheduling related questions, please contact the cardiac imaging nurse navigator should you have any questions/concerns: Marchia Bond, Cardiac Imaging Nurse Navigator Gordy Clement, Cardiac Imaging Nurse Navigator [facility] Direct Office Dial: [phone]   For scheduling needs, including cancellations and rescheduling, please call [place], [phone].

## 2020-11-02 ENCOUNTER — Telehealth: Payer: Self-pay | Admitting: Cardiovascular Disease

## 2020-11-02 NOTE — Telephone Encounter (Signed)
Called patient's pharmacy to find out what is going on with the coupon card. [facility] stated that the numbers were off, and after given number again for coupon the 30 day free dose went through. Will send message to our pre auth nurse to see if she can help patient with the cost of future refills.

## 2020-11-02 NOTE — Telephone Encounter (Signed)
Pt c/o medication issue:  1. Name of Medication: sacubitril-valsartan (ENTRESTO) 24-26 MG  2. How are you currently taking this medication (dosage and times per day)? New medication  3. Are you having a reaction (difficulty breathing--STAT)? no  4. What is your medication issue? Patient states he was supposed to get a coupon to get the first 30 days free of the medication, but he did not receive one. He states it will cost $312.

## 2020-11-05 NOTE — Telephone Encounter (Signed)
**Note De-identified Kimmie Berggren Obfuscation** LMTCB

## 2020-11-20 ENCOUNTER — Ambulatory Visit: Payer: Medicare Other | Attending: Internal Medicine

## 2020-11-20 DIAGNOSIS — Z20822 Contact with and (suspected) exposure to covid-19: Secondary | ICD-10-CM

## 2020-11-22 ENCOUNTER — Other Ambulatory Visit: Payer: Medicare Other | Admitting: *Deleted

## 2020-11-22 ENCOUNTER — Ambulatory Visit: Payer: Medicare Other

## 2020-11-22 ENCOUNTER — Other Ambulatory Visit: Payer: Self-pay

## 2020-11-22 ENCOUNTER — Ambulatory Visit (INDEPENDENT_AMBULATORY_CARE_PROVIDER_SITE_OTHER): Payer: Medicare Other | Admitting: Pharmacist

## 2020-11-22 VITALS — BP 134/82 | HR 83

## 2020-11-22 DIAGNOSIS — I429 Cardiomyopathy, unspecified: Secondary | ICD-10-CM | POA: Diagnosis not present

## 2020-11-22 DIAGNOSIS — E785 Hyperlipidemia, unspecified: Secondary | ICD-10-CM

## 2020-11-22 DIAGNOSIS — I10 Essential (primary) hypertension: Secondary | ICD-10-CM

## 2020-11-22 LAB — BASIC METABOLIC PANEL
BUN/Creatinine Ratio: 18 (ref 10–24)
BUN: 19 mg/dL (ref 8–27)
CO2: 25 mmol/L (ref 20–29)
Calcium: 8.9 mg/dL (ref 8.6–10.2)
Chloride: 104 mmol/L (ref 96–106)
Creatinine, Ser: 1.07 mg/dL (ref 0.76–1.27)
Glucose: 82 mg/dL (ref 65–99)
Potassium: 4.1 mmol/L (ref 3.5–5.2)
Sodium: 142 mmol/L (ref 134–144)
eGFR: 77 mL/min/{1.73_m2} (ref >59)

## 2020-11-22 LAB — SARS-COV-2, NAA 2 DAY TAT

## 2020-11-22 LAB — NOVEL CORONAVIRUS, NAA: SARS-CoV-2, NAA: NOT DETECTED

## 2020-11-22 MED ORDER — METOPROLOL SUCCINATE ER 25 MG PO TB24: 25.0000 mg | ORAL_TABLET | Freq: Every day | ORAL | 3 refills | Status: DC

## 2020-11-22 NOTE — Patient Instructions (Addendum)
It was a pleasure to meet you!  Please start taking metoprolol succinate 25mg  daily  Continue taking Entresto 24/26mg  twice a day  Call me at [phone] with any issues  Continue checking blood pressure. Take blood pressure seated, after resting 5 min

## 2020-11-22 NOTE — Progress Notes (Signed)
Patient ID: [name]                 DOB: 15-Oct-1955                      MRN: [id number]     HPI: SENG LARCH is a 65 y.o. male referred by Dr. Johnsie Cancel to pharmacy clinic for HF medication management. PMH is significant for AML s/p BMT x 2, HLD, chronic systolic CHF. Most recent LVEF 45-50% on 09/14/20.  Today he presents to pharmacy clinic for further medication titration. At last visit with MD he was started on Entresto 24/26mg  BID. Patient has no symptoms of heart failure. NYHA I. He is very invested in his health and asks many great questions. Has some mild fatigue that he attributes to his chronic low hemoglobin. Does state he has a hard time with erectile dysfunction when he takes the Singac. However if he postpones his AM dose until after sexual activity he is fine. He still has cough a little bit of a cough with Entresto, not as bad as with lisinopril. He goes to a Physiological scientist twice a week and also walks the trails with his wife. He has been checking his blood pressure lying down because he is concerned about sitting/standing up and passing out. He feels fine however and this has not happened.  Concerned about cost of Entresto. Looks like he needs to finished his deductible then cost would be 42/month or 126/90 days. Patient states this is affordable.  Current CHF meds: Entresto 24/26mg  twice a day BP goal: <130/80  Family History:  Family History  Problem Relation Age of Onset  . Hypertension Father   . Heart disease Father   . Prostate cancer Father        dx late 43s  . Cancer Father 57       prostate ca  . Celiac disease Mother   . Colon polyps Mother   . Breast cancer Mother   . Cancer Mother        Bladder  . Colon cancer Neg Hx   . Esophageal cancer Neg Hx   . Inflammatory bowel disease Neg Hx   . Liver disease Neg Hx   . Pancreatic cancer Neg Hx   . Rectal cancer Neg Hx   . Stomach cancer Neg Hx      Social History: never smoked, occassonal  ETOH  Diet: not discussed  Exercise: personal trainer twice a week and also walks the trails with his wife  Home BP readings: lying down 122/72 HR 75, 129/75 HR 79 (AM lying) 128/80  Wt Readings from Last 3 Encounters:  11/01/20 175 lb (79.4 kg)  08/08/20 174 lb 3.2 oz (79 kg)  11/22/19 165 lb 6.4 oz (75 kg)   BP Readings from Last 3 Encounters:  11/01/20 130/90  08/08/20 128/74  11/22/19 120/80   Pulse Readings from Last 3 Encounters:  11/01/20 79  08/08/20 73  11/22/19 73    Renal function: CrCl cannot be calculated (Patient's most recent lab result is older than the maximum 21 days allowed.).  Past Medical History:  Diagnosis Date  . Acute myeloid leukemia in remission ([place]) 1999  . Anemia   . Arthritis   . Depression   . Diverticulosis of colon (without mention of hemorrhage)   . Diverticulosis of colon (without mention of hemorrhage) 04/21/2011  . Esophageal reflux   . Family hx colonic polyps   . Family  hx colonic polyps 04/21/2011  . Hiatal hernia   . Hiatal hernia 04/21/2011  . History of kidney stones   . Hyperlipemia   . Hypertension   . MDS (myelodysplastic syndrome) ([place])   . Psychosexual dysfunction with inhibited sexual excitement     Current Outpatient Medications on File Prior to Visit  Medication Sig Dispense Refill  . atorvastatin (LIPITOR) 40 MG tablet Take 1 tablet (40 mg total) by mouth daily. 90 tablet 3  . azelastine (ASTELIN) 0.1 % nasal spray Place 2 sprays into both nostrils 2 (two) times daily. 30 mL 12  . B Complex Vitamins (VITAMIN B COMPLEX) TABS Take by mouth.    . benzonatate (TESSALON PERLES) 100 MG capsule Take 2 capsules (200 mg total) by mouth 2 (two) times daily as needed. 20 capsule 0  . calcium-vitamin D (OSCAL WITH D) 500-200 MG-UNIT TABS tablet Take by mouth.    . EPIPEN 2-PAK 0.3 MG/0.3ML DEVI Inject 0.3 mg into the muscle as needed.     Marland Kitchen guaiFENesin-codeine 100-10 MG/5ML syrup Take 5 mLs by mouth 3 (three) times daily as  needed for cough. 120 mL 0  . metoprolol tartrate (LOPRESSOR) 100 MG tablet Take one tablet by mouth 2 hours prior to CT test 1 tablet 0  . Nutritional Supplements (JUICE PLUS FIBRE PO) Take by mouth. 2 tabs twice daily    . omeprazole (PRILOSEC) 40 MG capsule Take 1 capsule (40 mg total) by mouth daily. 90 capsule 4  . sacubitril-valsartan (ENTRESTO) 24-26 MG Take 1 tablet by mouth 2 (two) times daily. 60 tablet 11  . sertraline (ZOLOFT) 50 MG tablet TAKE 1 TABLET BY MOUTH DAILY 84 tablet 0  . sildenafil (VIAGRA) 100 MG tablet Take 0.5 tablets (50 mg total) by mouth as needed for erectile dysfunction. 30 tablet 6  . valACYclovir (VALTREX) 500 MG tablet Take 1 tablet (500 mg total) by mouth 2 (two) times daily. 1 tablet twice daily for 1 day for each  episode of herpes simplex 20 tablet 2   No current facility-administered medications on file prior to visit.    Allergies  Allergen Reactions  . Morphine Other (See Comments)    UNKNOWN - REACTION IS NOT LISTED  . Marinol [Dronabinol]     Delusions.     Assessment/Plan:  1. CHF - Patient tolerating Entresto well. BMP pending. We discussed the 4 pillars of HF. Discussed that his EF is mid range and a little bit of the pathophysiology of HF and why its important to add meds now while he still feels good. Since he is concerned about BP dropping low, will add metoprolol instead of carvedilol. This should also give [place] more room to titrate Entresto and add spironolactone. Start metoprolol succinate 25mg  daily. Side effects of medications reviewed. I have asked patient to check his blood pressure sitting up. Follow up in clinic in 3 weeks. Will plan to increase Entresto at that time if able.  Ramond Dial, Pharm.D, BCPS, CPP [place]  [address] [place], [place] [address]  Phone: [phone]; Fax: [phone]

## 2020-11-27 ENCOUNTER — Ambulatory Visit (INDEPENDENT_AMBULATORY_CARE_PROVIDER_SITE_OTHER): Payer: Medicare Other | Admitting: Family Medicine

## 2020-11-27 ENCOUNTER — Encounter: Payer: Self-pay | Admitting: Family Medicine

## 2020-11-27 ENCOUNTER — Other Ambulatory Visit: Payer: Self-pay

## 2020-11-27 ENCOUNTER — Telehealth: Payer: Self-pay | Admitting: *Deleted

## 2020-11-27 VITALS — BP 124/68 | HR 78 | Temp 97.3°F | Ht 70.0 in | Wt 176.2 lb

## 2020-11-27 DIAGNOSIS — E785 Hyperlipidemia, unspecified: Secondary | ICD-10-CM

## 2020-11-27 DIAGNOSIS — Z0000 Encounter for general adult medical examination without abnormal findings: Secondary | ICD-10-CM

## 2020-11-27 DIAGNOSIS — C9201 Acute myeloblastic leukemia, in remission: Secondary | ICD-10-CM

## 2020-11-27 DIAGNOSIS — N400 Enlarged prostate without lower urinary tract symptoms: Secondary | ICD-10-CM | POA: Diagnosis not present

## 2020-11-27 DIAGNOSIS — D469 Myelodysplastic syndrome, unspecified: Secondary | ICD-10-CM | POA: Diagnosis not present

## 2020-11-27 DIAGNOSIS — I10 Essential (primary) hypertension: Secondary | ICD-10-CM

## 2020-11-27 DIAGNOSIS — R739 Hyperglycemia, unspecified: Secondary | ICD-10-CM | POA: Diagnosis not present

## 2020-11-27 DIAGNOSIS — F419 Anxiety disorder, unspecified: Secondary | ICD-10-CM

## 2020-11-27 LAB — COMPREHENSIVE METABOLIC PANEL
ALT: 25 U/L (ref 0–53)
AST: 18 U/L (ref 0–37)
Albumin: 3.8 g/dL (ref 3.5–5.2)
Alkaline Phosphatase: 49 U/L (ref 39–117)
BUN: 29 mg/dL — ABNORMAL HIGH (ref 6–23)
CO2: 29 mEq/L (ref 19–32)
Calcium: 9 mg/dL (ref 8.4–10.5)
Chloride: 107 mEq/L (ref 96–112)
Creatinine, Ser: 1.1 mg/dL (ref 0.40–1.50)
GFR: 70.54 mL/min (ref >60.00)
Glucose, Bld: 68 mg/dL — ABNORMAL LOW (ref 70–99)
Potassium: 4.5 mEq/L (ref 3.5–5.1)
Sodium: 142 mEq/L (ref 135–145)
Total Bilirubin: 0.3 mg/dL (ref 0.2–1.2)
Total Protein: 5.9 g/dL — ABNORMAL LOW (ref 6.0–8.3)

## 2020-11-27 LAB — CBC
HCT: 38.1 % — ABNORMAL LOW (ref 39.0–52.0)
Hemoglobin: 12.8 g/dL — ABNORMAL LOW (ref 13.0–17.0)
MCHC: 33.6 g/dL (ref 30.0–36.0)
MCV: 97.1 fl (ref 78.0–100.0)
Platelets: 248 10*3/uL (ref 150.0–400.0)
RBC: 3.93 Mil/uL — ABNORMAL LOW (ref 4.22–5.81)
RDW: 13.3 % (ref 11.5–15.5)
WBC: 5.1 10*3/uL (ref 4.0–10.5)

## 2020-11-27 LAB — LIPID PANEL
Cholesterol: 140 mg/dL (ref 0–200)
HDL: 44.4 mg/dL (ref >39.00)
LDL Cholesterol: 76 mg/dL (ref 0–99)
NonHDL: 95.19
Total CHOL/HDL Ratio: 3
Triglycerides: 96 mg/dL (ref 0.0–149.0)
VLDL: 19.2 mg/dL (ref 0.0–40.0)

## 2020-11-27 LAB — HEMOGLOBIN A1C: Hgb A1c MFr Bld: 6.4 % (ref 4.6–6.5)

## 2020-11-27 LAB — TSH: TSH: 1.87 u[IU]/mL (ref 0.35–4.50)

## 2020-11-27 LAB — PSA: PSA: 1.89 ng/mL (ref 0.10–4.00)

## 2020-11-27 MED ORDER — SILDENAFIL CITRATE 100 MG PO TABS: 50.0000 mg | ORAL_TABLET | ORAL | 6 refills | Status: DC | PRN

## 2020-11-27 MED ORDER — SERTRALINE HCL 50 MG PO TABS: 1.0000 | ORAL_TABLET | Freq: Every day | ORAL | 3 refills | Status: DC

## 2020-11-27 NOTE — Assessment & Plan Note (Signed)
At goal.  On Entresto and metoprolol per cardiology.  We will check labs today.

## 2020-11-27 NOTE — Patient Instructions (Signed)
It was very nice to see you today!  Please try increasing your fiber intake with a fiber supplement such as Metamucil or Benefiber.  Please let me know if your diarrhea is not improving.  I will refill your medications today we will check blood work today.  Please schedule your colonoscopy soon.  I will see back in year for your next annual checkup with labs.  Please come back to see me sooner if needed.  Take care, Dr [name]  PLEASE NOTE:  If you had any lab tests please let us know if you have not heard back within a few days. You may see your results on mychart before we have a chance to review them but we will give you a call once they are reviewed by [place]. If we ordered any referrals today, please let us know if you have not heard from their office within the next week.   Please try these tips to maintain a healthy lifestyle:   Eat at least 3 REAL meals and 1-2 snacks per day.  Aim for no more than 5 hours between eating.  If you eat breakfast, please do so within one hour of getting up.    Each meal should contain half fruits/vegetables, one quarter protein, and one quarter carbs (no bigger than a computer mouse)   Cut down on sweet beverages. This includes juice, soda, and sweet tea.     Drink at least 1 glass of water with each meal and aim for at least 8 glasses per day   Exercise at least 150 minutes every week.    Preventive Care 28 Years and Older, Male Preventive care refers to lifestyle choices and visits with your health care provider that can promote health and wellness. This includes:  A yearly physical exam. This is also called an annual wellness visit.  Regular dental and eye exams.  Immunizations.  Screening for certain conditions.  Healthy lifestyle choices, such as: ? Eating a healthy diet. ? Getting regular exercise. ? Not using drugs or products that contain nicotine and tobacco. ? Limiting alcohol use. What can I expect for my preventive  care visit? Physical exam Your health care provider will check your:  Height and weight. These may be used to calculate your BMI (body mass index). BMI is a measurement that tells if you are at a healthy weight.  Heart rate and blood pressure.  Body temperature.  Skin for abnormal spots. Counseling Your health care provider may ask you questions about your:  Past medical problems.  Family's medical history.  Alcohol, tobacco, and drug use.  Emotional well-being.  Home life and relationship well-being.  Sexual activity.  Diet, exercise, and sleep habits.  History of falls.  Memory and ability to understand (cognition).  Work and work Statistician.  Access to firearms. What immunizations do I need? Vaccines are usually given at various ages, according to a schedule. Your health care provider will recommend vaccines for you based on your age, medical history, and lifestyle or other factors, such as travel or where you work.   What tests do I need? Blood tests  Lipid and cholesterol levels. These may be checked every 5 years, or more often depending on your overall health.  Hepatitis C test.  Hepatitis B test. Screening  Lung cancer screening. You may have this screening every year starting at age 74 if you have a 30-pack-year history of smoking and currently smoke or have quit within the past 15 years.  Colorectal cancer screening. ? All adults should have this screening starting at age 83 and continuing until age 77. ? Your health care provider may recommend screening at age 32 if you are at increased risk. ? You will have tests every 1-10 years, depending on your results and the type of screening test.  Prostate cancer screening. Recommendations will vary depending on your family history and other risks.  Genital exam to check for testicular cancer or hernias.  Diabetes screening. ? This is done by checking your blood sugar (glucose) after you have not eaten  for a while (fasting). ? You may have this done every 1-3 years.  Abdominal aortic aneurysm (AAA) screening. You may need this if you are a current or former smoker.  STD (sexually transmitted disease) testing, if you are at risk. Follow these instructions at home: Eating and drinking  Eat a diet that includes fresh fruits and vegetables, whole grains, lean protein, and low-fat dairy products. Limit your intake of foods with high amounts of sugar, saturated fats, and salt.  Take vitamin and mineral supplements as recommended by your health care provider.  Do not drink alcohol if your health care provider tells you not to drink.  If you drink alcohol: ? Limit how much you have to 0-2 drinks a day. ? Be aware of how much alcohol is in your drink. In the [place] one drink equals one 12 oz bottle of beer (355 mL), one 5 oz glass of wine (148 mL), or one 1 oz glass of hard liquor (44 mL).   Lifestyle  Take daily care of your teeth and gums. Brush your teeth every morning and night with fluoride toothpaste. Floss one time each day.  Stay active. Exercise for at least 30 minutes 5 or more days each week.  Do not use any products that contain nicotine or tobacco, such as cigarettes, e-cigarettes, and chewing tobacco. If you need help quitting, ask your health care provider.  Do not use drugs.  If you are sexually active, practice safe sex. Use a condom or other form of protection to prevent STIs (sexually transmitted infections).  Talk with your health care provider about taking a low-dose aspirin or statin.  Find healthy ways to cope with stress, such as: ? Meditation, yoga, or listening to music. ? Journaling. ? Talking to a trusted person. ? Spending time with friends and family. Safety  Always wear your seat belt while driving or riding in a vehicle.  Do not drive: ? If you have been drinking alcohol. Do not ride with someone who has been drinking. ? When you are tired or  distracted. ? While texting.  Wear a helmet and other protective equipment during sports activities.  If you have firearms in your house, make sure you follow all gun safety procedures. What's next?  Visit your health care provider once a year for an annual wellness visit.  Ask your health care provider how often you should have your eyes and teeth checked.  Stay up to date on all vaccines. This information is not intended to replace advice given to you by your health care provider. Make sure you discuss any questions you have with your health care provider. Document Revised: 04/26/2019 Document Reviewed: 07/22/2018 Elsevier Patient Education  2021 [employer].

## 2020-11-27 NOTE — Assessment & Plan Note (Signed)
Stable.  Managed by oncology.

## 2020-11-27 NOTE — Assessment & Plan Note (Signed)
Check lipids.  Continue pravastatin 20 mg daily.

## 2020-11-27 NOTE — Progress Notes (Signed)
Chief Complaint:  [name] is a 65 y.o. male who presents today for a Medicare Initial Preventive Examination.  Assessment/Plan:  New/Acute Problems: Diarrhea No red flags. Recommended increased fiber intake.  He will continue probiotics.  We will hold off on further testing for now.  He will let me know if symptoms or not improving.  Chronic Problems Addressed Today: AML (acute myeloid leukemia) in remission ([name]) s/p BMT 2000 Stable.  Managed by oncology.  Essential hypertension, benign At goal.  On Entresto and metoprolol per cardiology.  We will check labs today.  Dyslipidemia Check lipids.  Continue pravastatin 20 mg daily.  BPH (benign prostatic hyperplasia) Check PSA.  Did not tolerate tamsulosin due to side effects.  Does not want further medications at this point.  Myelodysplasia (myelodysplastic syndrome) s/p BMT 2020 Continue management per oncology.  Anxiety Stable on Zoloft 50 mg daily.  Will refill today.   Preventative Healthcare Up-to-date on vaccines.  Due for colonoscopy later this year-he will call to get this scheduled.  We will check PSA today. Health Maintenance  Topic Date Due  . HIV Screening  Never done  . INFLUENZA VACCINE  03/11/2021  . COLONOSCOPY (Pts 45-85yrs Insurance coverage will need to be confirmed)  04/20/2021  . TETANUS/TDAP  05/18/2021  . COVID-19 Vaccine  Completed  . Hepatitis C Screening  Completed  . PNA vac Low Risk Adult  Completed  . HPV VACCINES  Aged Out    Patient Counseling(The following topics were reviewed and/or handout was given):  -Nutrition: Stressed importance of moderation in sodium/caffeine intake, saturated fat and cholesterol, caloric balance, sufficient intake of fresh fruits, vegetables, and fiber.  -Stressed the importance of regular exercise.   -Substance Abuse: Discussed cessation/primary prevention of tobacco, alcohol, or other drug use; driving or other dangerous activities under the  influence; availability of treatment for abuse.   -Injury prevention: Discussed safety belts, safety helmets, smoke detector, smoking near bedding or upholstery.   -Sexuality: Discussed sexually transmitted diseases, partner selection, use of condoms, avoidance of unintended pregnancy and contraceptive alternatives.   -Dental health: Discussed importance of regular tooth brushing, flossing, and dental visits.  -Health maintenance and immunizations reviewed. Please refer to Health maintenance section.  Advance Directives were discussed with the patient.   Patient Instructions (the written plan) was given to the patient.  Medicare Attestation I have personally reviewed: The patient's medical and social history Their use of alcohol, tobacco or illicit drugs Their current medications and supplements The patient's functional ability including ADLs,fall risks, home safety risks, cognitive, and hearing and visual impairment Diet and physical activities Evidence for depression or mood disorders He is not on any opioid medications.  The patient's weight, height, BMI, and visual acuity have been recorded in the chart.  I have made referrals, counseling, and provided education to the patient based on review of the above and I have provided the patient with a written personalized care plan for preventive services.    During the course of the visit the patient was educated and counseled about appropriate screening and preventive services including:        Colorectal cancer screening Diabetes screening    Subjective:  HPI:  See A/P for status of chronic conditions.  He is doing well today.  Is been having intermittent diarrhea for the past few months.  Usually occurs about once per week.  Wife has had issues with similar symptoms.  No abdominal pain.  No nausea or vomiting.  No melena or hematochezia.  He has tried increasing yogurt intake with no significant improvement.   Activities of Daily  Living: He has no difficulty performing the following: . Preparing food and eating . Bathing  . Getting dressed . Using the toilet . Shopping . Managing Finances . Moving around from place to place  Fall Screening: He has not had any falls within the past year.   Hearing Screening: Patient has no concerns over hearing.  Safety Screening Patient feels safe at home.  There are no smokers at home.   Depression Screening: Depression screen [facility] 2/9 11/27/2020  Decreased Interest 0  Down, Depressed, Hopeless 0  PHQ - 2 Score 0   Lifestyle Factors: Diet: Balanced. Plenty of fruits and vegetables.  Exercise: Working out two days per week with a Clinical research associate.   Patient Care Team: [name], MD as PCP - General (Family Medicine) Brantley Fling, MD as Consulting Physician (Internal Medicine)   ROS: Per HPI  PMH:  The following were reviewed and entered/updated in epic: Past Medical History:  Diagnosis Date  . Acute myeloid leukemia in remission ([name]) 1999  . Anemia   . Arthritis   . Depression   . Diverticulosis of colon (without mention of hemorrhage)   . Diverticulosis of colon (without mention of hemorrhage) 04/21/2011  . Esophageal reflux   . Family hx colonic polyps   . Family hx colonic polyps 04/21/2011  . Hiatal hernia   . Hiatal hernia 04/21/2011  . History of kidney stones   . Hyperlipemia   . Hypertension   . MDS (myelodysplastic syndrome) ([name])   . Psychosexual dysfunction with inhibited sexual excitement    Past Surgical History:  Procedure Laterality Date  . SHOULDER SURGERY     x 3    Family History  Problem Relation Age of Onset  . Hypertension Father   . Heart disease Father   . Prostate cancer Father        dx late 81s  . Cancer Father 3       prostate ca  . Celiac disease Mother   . Colon polyps Mother   . Breast cancer Mother   . Cancer Mother        Bladder  . Colon cancer Neg Hx   . Esophageal cancer Neg Hx   . Inflammatory bowel  disease Neg Hx   . Liver disease Neg Hx   . Pancreatic cancer Neg Hx   . Rectal cancer Neg Hx   . Stomach cancer Neg Hx     Medications- reviewed and updated Current Outpatient Medications  Medication Sig Dispense Refill  . B Complex Vitamins (VITAMIN B COMPLEX) TABS Take by mouth.    . calcium-vitamin D (OSCAL WITH D) 500-200 MG-UNIT TABS tablet Take by mouth.    . EPIPEN 2-PAK 0.3 MG/0.3ML DEVI Inject 0.3 mg into the muscle as needed.     . fluticasone (FLONASE) 50 MCG/ACT nasal spray Place into both nostrils daily.    . metoprolol succinate (TOPROL XL) 25 MG 24 hr tablet Take 1 tablet (25 mg total) by mouth daily. 90 tablet 3  . Nutritional Supplements (JUICE PLUS FIBRE PO) Take by mouth. 2 tabs twice daily    . omeprazole (PRILOSEC) 20 MG capsule Take 20 mg by mouth daily.    . pravastatin (PRAVACHOL) 20 MG tablet Take 20 mg by mouth daily.    . sacubitril-valsartan (ENTRESTO) 24-26 MG Take 1 tablet by mouth 2 (two) times daily.  60 tablet 11  . valACYclovir (VALTREX) 500 MG tablet Take 1 tablet (500 mg total) by mouth 2 (two) times daily. 1 tablet twice daily for 1 day for each  episode of herpes simplex 20 tablet 2  . sertraline (ZOLOFT) 50 MG tablet Take 1 tablet (50 mg total) by mouth daily. 90 tablet 3  . sildenafil (VIAGRA) 100 MG tablet Take 0.5 tablets (50 mg total) by mouth as needed for erectile dysfunction. 30 tablet 6   No current facility-administered medications for this visit.    Allergies-reviewed and updated Allergies  Allergen Reactions  . Morphine Other (See Comments)    UNKNOWN - REACTION IS NOT LISTED  . Marinol [Dronabinol]     Delusions.    Social History   Socioeconomic History  . Marital status: Married    Spouse name: Not on file  . Number of children: 2  . Years of education: Not on file  . Highest education level: Not on file  Occupational History  . Occupation: self employed    Employer: [employer]  Tobacco Use  . Smoking status: Never  Smoker  . Smokeless tobacco: Never Used  Vaping Use  . Vaping Use: Never used  Substance and Sexual Activity  . Alcohol use: Yes    Comment: one occ   . Drug use: No  . Sexual activity: Never  Other Topics Concern  . Not on file  Social History Narrative   2 caffeine drinks daily    Social Determinants of Health   Financial Resource Strain: Not on file  Food Insecurity: Not on file  Transportation Needs: Not on file  Physical Activity: Not on file  Stress: Not on file  Social Connections: Not on file         Objective/Observations  Physical Exam: BP 124/68   Pulse 78   Temp (!) 97.3 F (36.3 C)   Ht 5\' 10"  (1.778 m)   Wt 176 lb 4 oz (79.9 kg)   SpO2 98%   BMI 25.29 kg/m   Fall Risk  11/27/2020  Falls in the past year? 0    Hearing Screening   Method: Auditory brainstem response   125Hz  250Hz  500Hz  1000Hz  2000Hz  3000Hz  4000Hz  6000Hz  8000Hz   Right ear:           Left ear:           Comments: Left ear pass  Right ear pass   Visual Acuity Screening   Right eye Left eye Both eyes  Without correction: 20/30 20/40 20/30   With correction:      Gen: NAD, resting comfortably CV: RRR with no murmurs appreciated Pulm: NWOB, CTAB with no crackles, wheezes, or rhonchi GI: Normal bowel sounds present. Soft, Nontender, Nondistended. MSK: no edema, cyanosis, or clubbing noted Skin: warm, dry Neuro: grossly normal, moves all extremities Psych: Normal affect and thought content. Normal minicog.       Algis Greenhouse. [name], MD 11/27/2020 9:02 AM

## 2020-11-27 NOTE — Telephone Encounter (Signed)
   [facility] Pre-operative Risk Assessment    Patient Name: [name]  DOB: [birth year]  MRN: [id number]   HEARTCARE STAFF: - Please ensure there is not already an duplicate clearance open for this procedure. - Under Visit Info/Reason for Call, type in Other and utilize the format Clearance MM/DD/YY or Clearance TBD. Do not use dashes or single digits. - If request is for dental extraction, please clarify the # of teeth to be extracted.  Request for surgical clearance:  1. What type of surgery is being performed? LEFT KNEE SCOPE MEDIAL AND LATERAL MENISCUS    2. When is this surgery scheduled? 02/13/21   3. What type of clearance is required (medical clearance vs. Pharmacy clearance to hold med vs. Both)? MEDICAL  4. Are there any medications that need to be held prior to surgery and how long? NONE LISTED   5. Practice name and name of physician performing surgery? [facility]; DR. Remo Lipps [place]   6. What is the office phone number? [phone]   7.   What is the office fax number? [phone] ATTN: ASHLEY HILTON  8.   Anesthesia type (None, local, MAC, general) ? CHOICE   [name] 11/27/2020, 2:17 PM  _________________________________________________________________   (provider comments below)

## 2020-11-27 NOTE — Assessment & Plan Note (Signed)
Stable on Zoloft 50 mg daily.  Will refill today.

## 2020-11-27 NOTE — Assessment & Plan Note (Signed)
Check PSA.  Did not tolerate tamsulosin due to side effects.  Does not want further medications at this point.

## 2020-11-27 NOTE — Assessment & Plan Note (Signed)
Continue management per oncology. 

## 2020-11-28 ENCOUNTER — Telehealth: Payer: Self-pay

## 2020-11-28 ENCOUNTER — Encounter: Payer: Self-pay | Admitting: Family Medicine

## 2020-11-28 ENCOUNTER — Telehealth: Payer: Self-pay | Admitting: Pharmacist

## 2020-11-28 DIAGNOSIS — R739 Hyperglycemia, unspecified: Secondary | ICD-10-CM | POA: Insufficient documentation

## 2020-11-28 DIAGNOSIS — I10 Essential (primary) hypertension: Secondary | ICD-10-CM

## 2020-11-28 MED ORDER — METOPROLOL SUCCINATE ER 25 MG PO TB24: 12.5000 mg | ORAL_TABLET | Freq: Every day | ORAL | 0 refills | Status: DC

## 2020-11-28 NOTE — Telephone Encounter (Signed)
Spoke with patient regarding lab results, gave a verbal understanding.

## 2020-11-28 NOTE — Telephone Encounter (Signed)
Patient stated he got a call from dr.parkers nurse, patient returned call

## 2020-11-28 NOTE — Telephone Encounter (Signed)
Patient called, reported that since starting metoprolol 25mg  daily he has felt exhausted.  Comes home and has to immediately take a nap.  Recommend he try 1/2 tablet and see if that helps.  Patient voiced understanding and will contact Melissa next week with updates as needed.

## 2020-11-28 NOTE — Telephone Encounter (Signed)
Pending coronary CT on 12/03/2020, will give cardiac clearance after the coronary CT.

## 2020-11-28 NOTE — Progress Notes (Signed)
Please inform patient of the following:  His A1c is borderline diabetic. Everything else is stable. He would benefit from starting metformin 500mg  daily ot improve his blood sugar and lower risk of heart attack and stroke. Please send this in if he is willing to start. Regardless he should continue working on diet and exercise. I would like to recheck in 6-12 months.

## 2020-11-29 ENCOUNTER — Telehealth (HOSPITAL_COMMUNITY): Payer: Self-pay | Admitting: *Deleted

## 2020-11-29 NOTE — Telephone Encounter (Signed)
Reaching out to patient to offer assistance regarding upcoming cardiac imaging study; pt verbalizes understanding of appt date/time, parking situation and where to check in, pre-test NPO status and medications ordered, and verified current allergies; name and call back number provided for further questions should they arise  Eraina Winnie RN Navigator Cardiac Imaging [facility] [phone] office [phone] cell  Pt to take 100mg metoprolol tartrate 2 hours prior to cardiac CT scan. 

## 2020-11-29 NOTE — Telephone Encounter (Signed)
Attempted to call patient regarding upcoming cardiac CT appointment. °Left message on voicemail with name and callback number ° °Sherryann Frese RN Navigator Cardiac Imaging °[facility] °[phone] Office °[phone] Cell ° °

## 2020-12-03 ENCOUNTER — Other Ambulatory Visit: Payer: Self-pay

## 2020-12-03 ENCOUNTER — Ambulatory Visit (HOSPITAL_COMMUNITY)
Admission: RE | Admit: 2020-12-03 | Discharge: 2020-12-03 | Disposition: A | Discharge status: Home / Self Care | Payer: Medicare Other | Source: Ambulatory Visit | Attending: Cardiovascular Disease | Admitting: Cardiovascular Disease

## 2020-12-03 DIAGNOSIS — E785 Hyperlipidemia, unspecified: Secondary | ICD-10-CM | POA: Insufficient documentation

## 2020-12-03 DIAGNOSIS — I10 Essential (primary) hypertension: Secondary | ICD-10-CM | POA: Diagnosis present

## 2020-12-03 DIAGNOSIS — I429 Cardiomyopathy, unspecified: Secondary | ICD-10-CM

## 2020-12-03 MED ORDER — NITROGLYCERIN 0.4 MG SL SUBL
SUBLINGUAL_TABLET | SUBLINGUAL | Status: AC
  Administered 2020-12-03: 0.8 mg via SUBLINGUAL
  Filled 2020-12-03: qty 2

## 2020-12-03 MED ORDER — IOHEXOL 350 MG/ML SOLN
95.0000 mL | Freq: Once | INTRAVENOUS | Status: AC | PRN
  Administered 2020-12-03: 95 mL via INTRAVENOUS

## 2020-12-03 MED ORDER — NITROGLYCERIN 0.4 MG SL SUBL: 0.8000 mg | SUBLINGUAL_TABLET | Freq: Once | SUBLINGUAL | Status: AC

## 2020-12-03 NOTE — Telephone Encounter (Addendum)
    [name] DOB:  10-18-1955  MRN:  [id number]   Primary Cardiologist: Jenkins Rouge, MD  Chart reviewed as part of pre-operative protocol coverage. Given past medical history and time since last visit, based on ACC/AHA guidelines, [name] would be at acceptable risk for the planned procedure without further cardiovascular testing.  Coronary CT obtained on 12/03/2020 showed minimal degree of CAD.  The patient was advised that if he develops new symptoms prior to surgery to contact our office to arrange for a follow-up visit, and he verbalized understanding.  I will route this recommendation to the requesting party via Epic fax function and remove from pre-op pool.  Please call with questions.  [place], [place] 12/03/2020, 11:31 AM

## 2020-12-03 NOTE — Progress Notes (Signed)
Took Viagra yesterday at 5PM. BP 113/73. Dr Audie Box called and made aware of this. He is OK to go ahead with the test and to give him both nitro. Patient heard he was to hold it for 4 hours. Patient aware we can proceed with test.

## 2020-12-05 ENCOUNTER — Telehealth: Payer: Self-pay

## 2020-12-05 NOTE — Telephone Encounter (Signed)
Patient called in stating he is having vomiting and diarrhea since Monday, [name] states he has spoke with Dr.Parker in the past but it was happening every 2 weeks and this has had diarrhea since Monday.

## 2020-12-05 NOTE — Telephone Encounter (Signed)
LVM to schedule appointment with PCP.

## 2020-12-06 ENCOUNTER — Telehealth: Payer: Self-pay | Admitting: Cardiovascular Disease

## 2020-12-06 NOTE — Telephone Encounter (Signed)
Pt reports severe vomiting & diarrhea since Monday. He saw [facility] yesterday for symptoms, passed out while there, received fluids for dehydration. Vomiting resolved since Tuesday, but diarrhea remains. CDiff negative. BPs normal. Mentions that clinic thought he should discuss w/ cardiology his recent new medications and if they may be the culprit. During discussion he mentions that he also has been taking Cefdinir BID for sinus infection.  Informed pt that this is more likely the culprit than Entresto/Metoprolol.  Pt states that he is not taking anymore, took last dose last night. Aware I will forward to pharmD for review but that most likely will continue current treatment plan and see if issues resolve after stopping the abx. Patient verbalized understanding and agreeable to plan.

## 2020-12-06 NOTE — Telephone Encounter (Signed)
I agree that diarrhea is more likely from abx. He should continue Entresto and metoprolol for now. If no improvement next week, please have him call me at [phone] and we can discuss holding metoprolol. He could try a probiotic, but sometimes this does take a few weeks to help if diarrhea was from abx killing good gut bacteria.

## 2020-12-06 NOTE — Telephone Encounter (Signed)
PT is calling with concerns he has been doing a lot of vomitting and diarrhea.Pt also states he went to the hospital where they gave him some fluids but he is still concerned thinking it could be medications he is on.Please advise

## 2020-12-07 NOTE — Telephone Encounter (Signed)
Pt has been informed to continue Entresto and metoprolol. He tells me he was diagnosed with Rotavirus.  Will work to stay hydrated.  He is going to ask at cancer center if he can come back in for fluids today.  He will call back if needs anything further from cardiology.

## 2020-12-13 ENCOUNTER — Ambulatory Visit (INDEPENDENT_AMBULATORY_CARE_PROVIDER_SITE_OTHER): Payer: Medicare Other | Admitting: Pharmacist

## 2020-12-13 ENCOUNTER — Other Ambulatory Visit: Payer: Self-pay

## 2020-12-13 DIAGNOSIS — I5020 Unspecified systolic (congestive) heart failure: Secondary | ICD-10-CM | POA: Insufficient documentation

## 2020-12-13 DIAGNOSIS — I5022 Chronic systolic (congestive) heart failure: Secondary | ICD-10-CM | POA: Insufficient documentation

## 2020-12-13 DIAGNOSIS — I509 Heart failure, unspecified: Secondary | ICD-10-CM

## 2020-12-13 NOTE — Patient Instructions (Addendum)
Enjoy your vacation!  Please continue Entresto 24/26mg  twice a day and  metoprolol succinate 12.5mg  daily.  Continue checking blood pressure once a day and please bring cuff and log with you to your next appointment.  Call me at [phone] with any questions

## 2020-12-13 NOTE — Progress Notes (Signed)
Patient ID: [name]                 DOB: [birth year]                      MRN: [id number]     HPI: [name] is a 65 y.o. male referred by Dr. Johnsie Cancel to pharmacy clinic for HF medication management. PMH is significant for AML s/p BMT x 2, HLD, chronic systolic CHF. Most recent LVEF 45-50% on 09/14/20.  Today he presents to pharmacy clinic for further medication titration. At last visit he was started on metoprolol succinate 25mg  daily. Since last visit he was diagnosed with rotavirus and needed to receive fluids for dehydration.  Patient presents today for follow up. States he is just starting to get his strength back from his GI bug. Had to get fluids 3 times. Has not checked his blood pressure in the last week and a half. Called the clinic a few weeks ago about fatigue with metoprolol. Dose was decreased to 12.5mg  daily. Patient is doing much better on this dose.  Current CHF meds: Entresto 24/26mg  twice a day, metoprolol succinate 12.5mg  daily BP goal: <130/80  Family History:  Family History  Problem Relation Age of Onset  . Hypertension Father   . Heart disease Father   . Prostate cancer Father        dx late 53s  . Cancer Father 47       prostate ca  . Celiac disease Mother   . Colon polyps Mother   . Breast cancer Mother   . Cancer Mother        Bladder  . Colon cancer Neg Hx   . Esophageal cancer Neg Hx   . Inflammatory bowel disease Neg Hx   . Liver disease Neg Hx   . Pancreatic cancer Neg Hx   . Rectal cancer Neg Hx   . Stomach cancer Neg Hx      Social History: never smoked, occassonal ETOH  Diet: not discussed  Exercise: personal trainer twice a week and also walks the trails with his wife  Home BP readings:HR low 70's  Wt Readings from Last 3 Encounters:  11/27/20 176 lb 4 oz (79.9 kg)  11/01/20 175 lb (79.4 kg)  08/08/20 174 lb 3.2 oz (79 kg)   BP Readings from Last 3 Encounters:  12/03/20 112/78  11/27/20 124/68  11/22/20 134/82    Pulse Readings from Last 3 Encounters:  12/03/20 61  11/27/20 78  11/22/20 83    Renal function: CrCl cannot be calculated (Unknown ideal weight.).  Past Medical History:  Diagnosis Date  . Acute myeloid leukemia in remission ([place]) 1999  . Anemia   . Arthritis   . Depression   . Diverticulosis of colon (without mention of hemorrhage)   . Diverticulosis of colon (without mention of hemorrhage) 04/21/2011  . Esophageal reflux   . Family hx colonic polyps   . Family hx colonic polyps 04/21/2011  . Hiatal hernia   . Hiatal hernia 04/21/2011  . History of kidney stones   . Hyperlipemia   . Hypertension   . MDS (myelodysplastic syndrome) ([place])   . Psychosexual dysfunction with inhibited sexual excitement     Current Outpatient Medications on File Prior to Visit  Medication Sig Dispense Refill  . B Complex Vitamins (VITAMIN B COMPLEX) TABS Take by mouth.    . calcium-vitamin D (OSCAL WITH D) 500-200 MG-UNIT TABS tablet Take by  mouth.    . cefdinir (OMNICEF) 300 MG capsule Take 300 mg by mouth 2 (two) times daily.    Marland Kitchen EPIPEN 2-PAK 0.3 MG/0.3ML DEVI Inject 0.3 mg into the muscle as needed.     . fluticasone (FLONASE) 50 MCG/ACT nasal spray Place into both nostrils daily.    . metoprolol succinate (TOPROL XL) 25 MG 24 hr tablet Take 0.5 tablets (12.5 mg total) by mouth daily. 30 tablet 0  . Nutritional Supplements (JUICE PLUS FIBRE PO) Take by mouth. 2 tabs twice daily    . omeprazole (PRILOSEC) 20 MG capsule Take 20 mg by mouth daily.    . pravastatin (PRAVACHOL) 20 MG tablet Take 20 mg by mouth daily.    . sacubitril-valsartan (ENTRESTO) 24-26 MG Take 1 tablet by mouth 2 (two) times daily. 60 tablet 11  . sertraline (ZOLOFT) 50 MG tablet Take 1 tablet (50 mg total) by mouth daily. 90 tablet 3  . sildenafil (VIAGRA) 100 MG tablet Take 0.5 tablets (50 mg total) by mouth as needed for erectile dysfunction. 30 tablet 6  . valACYclovir (VALTREX) 500 MG tablet Take 1 tablet (500 mg  total) by mouth 2 (two) times daily. 1 tablet twice daily for 1 day for each  episode of herpes simplex 20 tablet 2   No current facility-administered medications on file prior to visit.    Allergies  Allergen Reactions  . Morphine Other (See Comments)    UNKNOWN - REACTION IS NOT LISTED  . Marinol [Dronabinol]     Delusions.     Assessment/Plan:  1. CHF - Patient tolerating Entresto and metoprolol well. No changes today as patient is still recovering from the rotavirus. Will get a better idea of patient's blood pressure over the next few weeks. He will see Kathyrn Drown on 5/31. I recommend at that visit to either add spironolactone 12.5mg  daily or increase Entresto to 49/51mg  twice a day. Patient will then follow up with me 2 weeks later.   Ramond Dial, Pharm.D, BCPS, CPP [place]  [address], [place], [place] [address]  Phone: [phone]; Fax: [phone]

## 2021-01-04 NOTE — Progress Notes (Signed)
Cardiology Office Note   Date:  01/08/2021   ID:  [name], DOB [birth year], MRN [id number]  PCP:  Melvin Barrack, MD  Cardiologist: Dr. Johnsie Cancel, MD   Chief Complaint  Patient presents with  . Follow-up    History of Present Illness: [name] is a 65 y.o. male who presents for follow-up, seen for Dr. Johnsie [name].   [name] was recently referred by his PCP given a family history of CAD, HTN and chronic systolic CHF. He has a personal hx of AML s/p bone marrow transplant x2, now in remission and myelodysplastic syndrome. After second transplant he had pulmonary edema from moderate MR? >>it was unclear what his EF was at that time. Appears that he had an echo at [facility] 09/14/20 that showed an EF at 45-50% with moderate global hypokinesis, mild MR, mild TR. Plan per chart review was to add beta blocker. Norvasc stopped. Dr. Johnsie [name] discussed possible start of Entresto over lisinopril with ultimate plan to start with CTA to r/o CAD. He previously underwent a coronary calcium score 01/25/2019 that was 77.2 that was considered 56th percentile for age and sex matched control with normal coronary origin. CTA performed 12/03/20 that showed minimal non-obstructive CAD (0-24%) with recommedartions to consider non-atherosclerotic causes of chest pain. Consider preventive therapy and risk factor modification.  Follow up labs appears to be stable with a creatinine at 1.10 and K+ at 4.5 from 11/27/20.   Today he presents and reports he is doing very well from a CV standpoint.  He is tolerating the Entresto well.  BP today is 124/80 which seem to be consistent with home BPs which he shares with me.  We discussed options of uptitrating Entresto which he is agreeable.  He denies chest pain, palpitations, LE edema, PND, shortness of breath, dizziness or syncope.  We discussed watching for signs of orthostatic hypotension including change in position slowly.  He is to call the office if he feels he is not  tolerating higher dose of Entresto.  We will obtain lab work today.  CTA results reviewed.  Past Medical History:  Diagnosis Date  . Acute myeloid leukemia in remission ([name]) 1999  . Anemia   . Arthritis   . Depression   . Diverticulosis of colon (without mention of hemorrhage)   . Diverticulosis of colon (without mention of hemorrhage) 04/21/2011  . Esophageal reflux   . Family hx colonic polyps   . Family hx colonic polyps 04/21/2011  . Hiatal hernia   . Hiatal hernia 04/21/2011  . History of kidney stones   . Hyperlipemia   . Hypertension   . MDS (myelodysplastic syndrome) ([name])   . Psychosexual dysfunction with inhibited sexual excitement     Past Surgical History:  Procedure Laterality Date  . SHOULDER SURGERY     x 3      Current Outpatient Medications  Medication Sig Dispense Refill  . aspirin EC 81 MG tablet Take 1 tablet (81 mg total) by mouth daily. Swallow whole. 90 tablet 3  . B Complex Vitamins (VITAMIN B COMPLEX) TABS Take by mouth.    . calcium-vitamin D (OSCAL WITH D) 500-200 MG-UNIT TABS tablet Take by mouth.    . EPIPEN 2-PAK 0.3 MG/0.3ML DEVI Inject 0.3 mg into the muscle as needed.     . fluticasone (FLONASE) 50 MCG/ACT nasal spray Place into both nostrils daily.    . metoprolol succinate (TOPROL XL) 25 MG 24 hr tablet Take 0.5 tablets (  12.5 mg total) by mouth daily. 30 tablet 0  . Nutritional Supplements (JUICE PLUS FIBRE PO) Take by mouth. 2 tabs twice daily    . omeprazole (PRILOSEC) 20 MG capsule Take 20 mg by mouth daily.    . pravastatin (PRAVACHOL) 20 MG tablet Take 20 mg by mouth daily.    . sacubitril-valsartan (ENTRESTO) 49-51 MG Take 1 tablet by mouth 2 (two) times daily. 60 tablet 11  . sertraline (ZOLOFT) 50 MG tablet Take 1 tablet (50 mg total) by mouth daily. 90 tablet 3  . sildenafil (VIAGRA) 100 MG tablet Take 0.5 tablets (50 mg total) by mouth as needed for erectile dysfunction. 30 tablet 6  . valACYclovir (VALTREX) 500 MG tablet Take 1  tablet (500 mg total) by mouth 2 (two) times daily. 1 tablet twice daily for 1 day for each  episode of herpes simplex 20 tablet 2   No current facility-administered medications for this visit.    Allergies:   Morphine and Marinol [dronabinol]    Social History:  The patient  reports that he has never smoked. He has never used smokeless tobacco. He reports current alcohol use. He reports that he does not use drugs.   Family History:  The patient's family history includes Breast cancer in his mother; Cancer in his mother; Cancer (age of onset: 59) in his father; Celiac disease in his mother; Colon polyps in his mother; Heart disease in his father; Hypertension in his father; Prostate cancer in his father.    ROS:  Please see the history of present illness. Otherwise, review of systems are positive for none.   All other systems are reviewed and negative.    PHYSICAL EXAM: VS:  BP 124/80   Pulse 78   Ht 5\' 10"  (1.778 m)   Wt 170 lb 6.4 oz (77.3 kg)   SpO2 98%   BMI 24.45 kg/m  , BMI Body mass index is 24.45 kg/m.  General: Well developed, well nourished, NAD Neck: Negative for carotid bruits. No JVD Lungs:Clear to ausculation bilaterally.  Breathing is unlabored. Cardiovascular: RRR with S1 S2. No murmurs Extremities: No edema. Radial  pulses 2+ bilaterally Neuro: Alert and oriented. No focal deficits. No facial asymmetry. MAE spontaneously. Psych: Responds to questions appropriately with normal affect.    EKG:  EKG is not ordered today.  Recent Labs: 11/27/2020: ALT 25; BUN 29; Creatinine, Ser 1.10; Hemoglobin 12.8; Platelets 248.0; Potassium 4.5; Sodium 142; TSH 1.87    Lipid Panel    Component Value Date/Time   CHOL 140 11/27/2020 0844   TRIG 96.0 11/27/2020 0844   TRIG 126 08/17/2006 0917   HDL 44.40 11/27/2020 0844   CHOLHDL 3 11/27/2020 0844   VLDL 19.2 11/27/2020 0844   LDLCALC 76 11/27/2020 0844   LDLDIRECT 146.2 07/29/2012 0830    Wt Readings from Last 3  Encounters:  01/08/21 170 lb 6.4 oz (77.3 kg)  11/27/20 176 lb 4 oz (79.9 kg)  11/01/20 175 lb (79.4 kg)    Other studies Reviewed: Additional studies/ records that were reviewed today include:  Review of the above records demonstrates:  Coronary calcium score 01/25/2019:  FINDINGS: Non-cardiac: See separate report from [facility].  Ascending Aorta:  Pericardium: Normal  Coronary arteries: Normal coronary origins  IMPRESSION: Coronary calcium score of 77.2 . This was 56th percentile for age and sex matched control.  Normal coronary origins.  CTA 12/03/20:  IMPRESSION: 1. Coronary calcium score of 120. This was 58th percentile for age-, sex-, and  race-matched controls.  2. Normal coronary origin with right dominance.  3. Minimal CAD (<25%) in the left main, LAD, and RCA.  RECOMMENDATIONS: 1. Minimal non-obstructive CAD (0-24%). Consider non-atherosclerotic causes of chest pain. Consider preventive therapy and risk factor Modification.  Echo 09/14/20:  SUMMARY  The left ventricular size is normal.  There is normal left ventricular wall thickness.  Left ventricular systolic function ismild to moderately reduced.  LV ejection fraction = 45-50%.  LV Global L Strain =-9.6%.  There is mild to moderate global hypokinesis of the left ventricle.  The right ventricle is normal in size and function.  There is mild mitral regurgitation.  Thereis mild tricuspid regurgitation.  Estimated right ventricular systolic pressure is 25 mmHg.  The aortic sinus is normal size.  IVC size was mildly dilated.  There is no pericardial effusion.  There is no significant change in comparison with the last  study11/08/2019.   ASSESSMENT AND PLAN:  1. Family hx of CAD: -CCTA with non-obstructive CAD and a calcium score at 158 -Continue with GDMT with Toprol, pravastatin -Will start ASA 81mg  QD  -Obtain HbA1c today for risk stratification   2. HLD: -LDL, 76 on  11/27/20 -Continue statin   3. AML s/p bone marrow transplant: -Follows with oncology/hematology    4. Chronic systolic CHF/nonischemic CM: -Last echo from [facility] 11/10/20 with LVEF at 45-50%>>currently treated with Entresto 24-26 and Toprol  -No need fo rdiurtics at this time  -Appears euvolemic on exam -CCTA with non-obstructive CAD and a calcium score at 158 -Continue with GDMT >>> uptitrate Entresto to 49/51 dosing -Obtain BMET>> repeat in 2 weeks -Patient to call if unable to tolerate due to soft BPs otherwise plan for lab work and follow-up in 3 months   Current medicines are reviewed at length with the patient today.  The patient does not have concerns regarding medicines.  The following changes have been made: Uptitrate Entresto to 49-51 dosing  Labs/ tests ordered today include: BMET, HbA1c  Orders Placed This Encounter  Procedures  . Basic metabolic panel  . Hemoglobin A1c  . Basic metabolic panel    Disposition:   FU with Dr. Johnsie [name] or APP  in 3 months  Signed, Kathyrn Drown, NP  01/08/2021 10:36 AM    [address], [place], [place]  [address] Phone: [phone]; Fax: [phone]

## 2021-01-08 ENCOUNTER — Other Ambulatory Visit: Payer: Self-pay

## 2021-01-08 ENCOUNTER — Ambulatory Visit (INDEPENDENT_AMBULATORY_CARE_PROVIDER_SITE_OTHER): Payer: Medicare Other | Admitting: Cardiology

## 2021-01-08 ENCOUNTER — Encounter: Payer: Self-pay | Admitting: Cardiology

## 2021-01-08 VITALS — BP 124/80 | HR 78 | Ht 70.0 in | Wt 170.4 lb

## 2021-01-08 DIAGNOSIS — I509 Heart failure, unspecified: Secondary | ICD-10-CM | POA: Diagnosis not present

## 2021-01-08 DIAGNOSIS — I429 Cardiomyopathy, unspecified: Secondary | ICD-10-CM | POA: Diagnosis not present

## 2021-01-08 DIAGNOSIS — Z79899 Other long term (current) drug therapy: Secondary | ICD-10-CM | POA: Diagnosis not present

## 2021-01-08 DIAGNOSIS — I5022 Chronic systolic (congestive) heart failure: Secondary | ICD-10-CM

## 2021-01-08 DIAGNOSIS — R739 Hyperglycemia, unspecified: Secondary | ICD-10-CM

## 2021-01-08 MED ORDER — ENTRESTO 49-51 MG PO TABS: 1.0000 | ORAL_TABLET | Freq: Two times a day (BID) | ORAL | 11 refills | Status: DC

## 2021-01-08 MED ORDER — ASPIRIN EC 81 MG PO TBEC: 81.0000 mg | DELAYED_RELEASE_TABLET | Freq: Every day | ORAL | 3 refills | Status: DC

## 2021-01-08 NOTE — Patient Instructions (Addendum)
Medication Instructions:  Your physician has recommended you make the following change in your medication:  1. INCREASE ENTRESTO TO 49-51 TWICE DAILY. 2. START ASPIRIN 81 MG DAILY.  *If you need a refill on your cardiac medications before your next appointment, please call your pharmacy*  Lab Work: TODAY: BMET, A1C  TO BE DONE IN 2 WEEK: BMET   If you have labs (blood work) drawn today and your tests are completely normal, you will receive your results only by: Marland Kitchen MyChart Message (if you have MyChart) OR . A paper copy in the mail If you have any lab test that is abnormal or we need to change your treatment, we will call you to review the results.   Testing/Procedures: NONE  Follow-Up: At [facility], you and your health needs are our priority.  As part of our continuing mission to provide you with exceptional heart care, we have created designated Provider Care Teams.  These Care Teams include your primary Cardiologist (physician) and Advanced Practice Providers (APPs -  Physician Assistants and Nurse Practitioners) who all work together to provide you with the care you need, when you need it.  We recommend signing up for the patient portal called "MyChart".  Sign up information is provided on this After Visit Summary.  MyChart is used to connect with patients for Virtual Visits (Telemedicine).  Patients are able to view lab/test results, encounter notes, upcoming appointments, etc.  Non-urgent messages can be sent to your provider as well.   To learn more about what you can do with MyChart, go to [email or web address].    Your next appointment:   3 month(s)  The format for your next appointment:   In Person  Provider:   Jenkins Rouge, MD or Kathyrn Drown, NP

## 2021-01-09 LAB — BASIC METABOLIC PANEL
BUN/Creatinine Ratio: 18 (ref 10–24)
BUN: 20 mg/dL (ref 8–27)
CO2: 24 mmol/L (ref 20–29)
Calcium: 9.6 mg/dL (ref 8.6–10.2)
Chloride: 101 mmol/L (ref 96–106)
Creatinine, Ser: 1.11 mg/dL (ref 0.76–1.27)
Glucose: 98 mg/dL (ref 65–99)
Potassium: 4.7 mmol/L (ref 3.5–5.2)
Sodium: 138 mmol/L (ref 134–144)
eGFR: 74 mL/min/{1.73_m2} (ref >59)

## 2021-01-09 LAB — HEMOGLOBIN A1C
Est. average glucose Bld gHb Est-mCnc: 126 mg/dL
Hgb A1c MFr Bld: 6 % — ABNORMAL HIGH (ref 4.8–5.6)

## 2021-01-22 ENCOUNTER — Ambulatory Visit (INDEPENDENT_AMBULATORY_CARE_PROVIDER_SITE_OTHER): Payer: Medicare Other | Admitting: Pharmacist

## 2021-01-22 ENCOUNTER — Other Ambulatory Visit: Payer: Medicare Other | Admitting: *Deleted

## 2021-01-22 ENCOUNTER — Other Ambulatory Visit: Payer: Self-pay

## 2021-01-22 VITALS — BP 134/90 | HR 78

## 2021-01-22 DIAGNOSIS — I509 Heart failure, unspecified: Secondary | ICD-10-CM | POA: Diagnosis not present

## 2021-01-22 DIAGNOSIS — I5022 Chronic systolic (congestive) heart failure: Secondary | ICD-10-CM

## 2021-01-22 DIAGNOSIS — Z79899 Other long term (current) drug therapy: Secondary | ICD-10-CM

## 2021-01-22 LAB — BASIC METABOLIC PANEL
BUN/Creatinine Ratio: 19 (ref 10–24)
BUN: 22 mg/dL (ref 8–27)
CO2: 21 mmol/L (ref 20–29)
Calcium: 9.2 mg/dL (ref 8.6–10.2)
Chloride: 102 mmol/L (ref 96–106)
Creatinine, Ser: 1.13 mg/dL (ref 0.76–1.27)
Glucose: 110 mg/dL — ABNORMAL HIGH (ref 65–99)
Potassium: 4.3 mmol/L (ref 3.5–5.2)
Sodium: 139 mmol/L (ref 134–144)
eGFR: 72 mL/min/{1.73_m2} (ref >59)

## 2021-01-22 MED ORDER — EMPAGLIFLOZIN 10 MG PO TABS: 10.0000 mg | ORAL_TABLET | Freq: Every day | ORAL | 11 refills | Status: DC

## 2021-01-22 NOTE — Patient Instructions (Signed)
I will submit a prior authorization for Jardiance or Farxgia. I will let you know via mychart when approved.  Please start checking your blood pressure at home more regularly.  Please call me at [phone] with any questions  Continue

## 2021-01-22 NOTE — Progress Notes (Signed)
Patient ID: ALBINO BUFFORD                 DOB: 09/29/1955                      MRN: [id number]     HPI: [name] is a 65 y.o. male referred by Dr. Johnsie Cancel to pharmacy clinic for HF medication management. PMH is significant for AML s/p BMT x 2, HLD, chronic systolic CHF. Most recent LVEF 45-50% on 09/14/20.  Today he presents to pharmacy clinic for further medication titration. At last visit with me he was still recovering from the Rotavirus and no medication changes were made. He saw Kathyrn Drown on 01/08/21. His Entresto was increased to 49/51mg  twice a day. Labs from [facility] on 6/6 show stable scr and K.   Patient presents today for follow up. He denies dizziness, lightheadedness, SOB or swelling. Has not been checking blood pressure at home often. States he has asian stir fry last night for dinner which might explain his higher BP today in clinic. Has been very active working on his golf game at the range and weight training.  He is getting ready to go on vacation at the end of the month. Recently had an issue with gum inflammation and bleeding. Has seen a periodontist and was given a ointment and mouthwash. Wondering what if might be from.   Current CHF meds: Entresto 49/51mg  twice a day, metoprolol succinate 12.5mg  daily BP goal: <130/80  Family History:  Family History  Problem Relation Age of Onset   Hypertension Father    Heart disease Father    Prostate cancer Father        dx late 45s   Cancer Father 67       prostate ca   Celiac disease Mother    Colon polyps Mother    Breast cancer Mother    Cancer Mother        Bladder   Colon cancer Neg Hx    Esophageal cancer Neg Hx    Inflammatory bowel disease Neg Hx    Liver disease Neg Hx    Pancreatic cancer Neg Hx    Rectal cancer Neg Hx    Stomach cancer Neg Hx     Social History: never smoked, occassonal ETOH  Diet: not discussed  Exercise: personal trainer twice a week and also walks the trails with his  wife, golf  Home BP readings: 123/72, 140/83,   Wt Readings from Last 3 Encounters:  01/08/21 170 lb 6.4 oz (77.3 kg)  11/27/20 176 lb 4 oz (79.9 kg)  11/01/20 175 lb (79.4 kg)   BP Readings from Last 3 Encounters:  01/08/21 124/80  12/13/20 118/70  12/03/20 112/78   Pulse Readings from Last 3 Encounters:  01/08/21 78  12/13/20 72  12/03/20 61    Renal function: Estimated Creatinine Clearance: 68.5 mL/min (by C-G formula based on SCr of 1.11 mg/dL).  Past Medical History:  Diagnosis Date   Acute myeloid leukemia in remission ([place]) 1999   Anemia    Arthritis    Depression    Diverticulosis of colon (without mention of hemorrhage)    Diverticulosis of colon (without mention of hemorrhage) 04/21/2011   Esophageal reflux    Family hx colonic polyps    Family hx colonic polyps 04/21/2011   Hiatal hernia    Hiatal hernia 04/21/2011   History of kidney stones    Hyperlipemia  Hypertension    MDS (myelodysplastic syndrome) (HCC)    Psychosexual dysfunction with inhibited sexual excitement     Current Outpatient Medications on File Prior to Visit  Medication Sig Dispense Refill   aspirin EC 81 MG tablet Take 1 tablet (81 mg total) by mouth daily. Swallow whole. 90 tablet 3   B Complex Vitamins (VITAMIN B COMPLEX) TABS Take by mouth.     calcium-vitamin D (OSCAL WITH D) 500-200 MG-UNIT TABS tablet Take by mouth.     EPIPEN 2-PAK 0.3 MG/0.3ML DEVI Inject 0.3 mg into the muscle as needed.      fluticasone (FLONASE) 50 MCG/ACT nasal spray Place into both nostrils daily.     metoprolol succinate (TOPROL XL) 25 MG 24 hr tablet Take 0.5 tablets (12.5 mg total) by mouth daily. 30 tablet 0   Nutritional Supplements (JUICE PLUS FIBRE PO) Take by mouth. 2 tabs twice daily     omeprazole (PRILOSEC) 20 MG capsule Take 20 mg by mouth daily.     pravastatin (PRAVACHOL) 20 MG tablet Take 20 mg by mouth daily.     sacubitril-valsartan (ENTRESTO) 49-51 MG Take 1 tablet by mouth 2 (two)  times daily. 60 tablet 11   sertraline (ZOLOFT) 50 MG tablet Take 1 tablet (50 mg total) by mouth daily. 90 tablet 3   sildenafil (VIAGRA) 100 MG tablet Take 0.5 tablets (50 mg total) by mouth as needed for erectile dysfunction. 30 tablet 6   valACYclovir (VALTREX) 500 MG tablet Take 1 tablet (500 mg total) by mouth 2 (two) times daily. 1 tablet twice daily for 1 day for each  episode of herpes simplex 20 tablet 2   No current facility-administered medications on file prior to visit.    Allergies  Allergen Reactions   Morphine Other (See Comments)    UNKNOWN - REACTION IS NOT LISTED   Marinol [Dronabinol]     Delusions.     Assessment/Plan:  1. CHF - BP is above goal of <130/80 today in clinic. Could possibly be from high sodium dinner. Patient tolerating increased Entresto dose well. We discussed the next medication options including increasing metoprolol to 25mg  daily or adding an SGLT2. He was initially started on metoprolol 25mg  daily but was decreased to 12.5mg  due to fatigue. Patient states, however, that he would be willing to re-challenge on higher beta blocker dose after discussing that fatigue typically resolved after 1-2 weeks. We discussed the CHF, DM and CKD benefits of SGLT2. Patient is prediabetic. I think an SGLT2 would be very beneficial for patient. We did discuss side effects including urinary/genetial infections and increase urination. Patient agreeable to start. When attempting PA for Jardiance, covermymeds said no PA needed. Rx sent to pharmacy. Continue Entresto 49/51mg  BID and metoprolol 12.5mg  daily. Follow up with me in about 3 weeks.   Ramond Dial, Pharm.D, BCPS, CPP [place]  [address] [place], [place] [address]  Phone: [phone]; Fax: [phone]

## 2021-01-29 ENCOUNTER — Other Ambulatory Visit: Payer: Self-pay

## 2021-01-29 ENCOUNTER — Encounter: Payer: Self-pay | Admitting: Registered Nurse

## 2021-01-29 ENCOUNTER — Ambulatory Visit (INDEPENDENT_AMBULATORY_CARE_PROVIDER_SITE_OTHER): Payer: Medicare Other | Admitting: Registered Nurse

## 2021-01-29 VITALS — BP 122/80 | HR 71 | Temp 98.1°F | Ht 70.0 in | Wt 165.0 lb

## 2021-01-29 DIAGNOSIS — K611 Rectal abscess: Secondary | ICD-10-CM | POA: Diagnosis not present

## 2021-01-29 MED ORDER — DOXYCYCLINE HYCLATE 100 MG PO TABS: 100.0000 mg | ORAL_TABLET | Freq: Two times a day (BID) | ORAL | 0 refills | Status: DC

## 2021-01-29 NOTE — Patient Instructions (Signed)
Mr. Slape -   Doristine Devoid to meet you  Doxycycline 100mg  twice daily can help reduce infection or hopefully resolve it  Using topical lidocaine or diclofenac (available OTC) can help as well.   If systemic symptoms of infection (fevers, chills, sweats, nausea, vomiting) arise, seek in person care.  We may need to get a general surgery consult if this recurs or persists. I'm doubting this will become an issue over the next 10 days  Enjoy your vacation  Thank you  Denice Paradise

## 2021-01-29 NOTE — Progress Notes (Signed)
Acute Office Visit  Subjective:    Patient ID: [name], male    DOB: April 07, 1956, 65 y.o.   MRN: [id number]  Chief Complaint  Patient presents with   Recurrent Skin Infections    HPI Patient is in today for cyst on anus  Onset a few weeks ago, felt like wart Now almost doubled in size in past few days Starting to have some pain and aching when sitting No systemic symptoms  Notes he is leaving to go on vacation tomorrow x 10 days   Past Medical History:  Diagnosis Date   Acute myeloid leukemia in remission ([place]) 1999   Anemia    Arthritis    Depression    Diverticulosis of colon (without mention of hemorrhage)    Diverticulosis of colon (without mention of hemorrhage) 04/21/2011   Esophageal reflux    Family hx colonic polyps    Family hx colonic polyps 04/21/2011   Hiatal hernia    Hiatal hernia 04/21/2011   History of kidney stones    Hyperlipemia    Hypertension    MDS (myelodysplastic syndrome) ([place])    Psychosexual dysfunction with inhibited sexual excitement     Past Surgical History:  Procedure Laterality Date   SHOULDER SURGERY     x 3     Family History  Problem Relation Age of Onset   Hypertension Father    Heart disease Father    Prostate cancer Father        dx late 37s   Cancer Father 41       prostate ca   Celiac disease Mother    Colon polyps Mother    Breast cancer Mother    Cancer Mother        Bladder   Colon cancer Neg Hx    Esophageal cancer Neg Hx    Inflammatory bowel disease Neg Hx    Liver disease Neg Hx    Pancreatic cancer Neg Hx    Rectal cancer Neg Hx    Stomach cancer Neg Hx     Social History   Socioeconomic History   Marital status: Married    Spouse name: Not on file   Number of children: 2   Years of education: Not on file   Highest education level: Not on file  Occupational History   Occupation: self employed    Employer: [employer]  Tobacco Use   Smoking status: Never   Smokeless tobacco:  Never  Vaping Use   Vaping Use: Never used  Substance and Sexual Activity   Alcohol use: Yes    Comment: one occ    Drug use: No   Sexual activity: Never  Other Topics Concern   Not on file  Social History Narrative   2 caffeine drinks daily    Social Determinants of Health   Financial Resource Strain: Not on file  Food Insecurity: Not on file  Transportation Needs: Not on file  Physical Activity: Not on file  Stress: Not on file  Social Connections: Not on file  Intimate Partner Violence: Not on file    Outpatient Medications Prior to Visit  Medication Sig Dispense Refill   aspirin EC 81 MG tablet Take 1 tablet (81 mg total) by mouth daily. Swallow whole. 90 tablet 3   B Complex Vitamins (VITAMIN B COMPLEX) TABS Take by mouth.     calcium-vitamin D (OSCAL WITH D) 500-200 MG-UNIT TABS tablet Take by mouth.     empagliflozin (JARDIANCE) 10  MG TABS tablet Take 1 tablet (10 mg total) by mouth daily. 30 tablet 11   EPIPEN 2-PAK 0.3 MG/0.3ML DEVI Inject 0.3 mg into the muscle as needed.      fluticasone (FLONASE) 50 MCG/ACT nasal spray Place into both nostrils daily.     metoprolol succinate (TOPROL XL) 25 MG 24 hr tablet Take 0.5 tablets (12.5 mg total) by mouth daily. 30 tablet 0   Nutritional Supplements (JUICE PLUS FIBRE PO) Take by mouth. 2 tabs twice daily     omeprazole (PRILOSEC) 20 MG capsule Take 20 mg by mouth daily.     pravastatin (PRAVACHOL) 20 MG tablet Take 20 mg by mouth daily.     sacubitril-valsartan (ENTRESTO) 49-51 MG Take 1 tablet by mouth 2 (two) times daily. 60 tablet 11   sertraline (ZOLOFT) 50 MG tablet Take 1 tablet (50 mg total) by mouth daily. 90 tablet 3   sildenafil (VIAGRA) 100 MG tablet Take 0.5 tablets (50 mg total) by mouth as needed for erectile dysfunction. 30 tablet 6   valACYclovir (VALTREX) 500 MG tablet Take 1 tablet (500 mg total) by mouth 2 (two) times daily. 1 tablet twice daily for 1 day for each  episode of herpes simplex 20 tablet 2    No facility-administered medications prior to visit.    Allergies  Allergen Reactions   Morphine Other (See Comments)    UNKNOWN - REACTION IS NOT LISTED   Marinol [Dronabinol]     Delusions.    Review of Systems  Constitutional: Negative.   HENT: Negative.    Eyes: Negative.   Respiratory: Negative.    Cardiovascular: Negative.   Gastrointestinal: Negative.   Genitourinary: Negative.   Musculoskeletal: Negative.   Skin: Negative.   Neurological: Negative.   Psychiatric/Behavioral: Negative.    All other systems reviewed and are negative.     Objective:    Physical Exam Vitals and nursing note reviewed.  Constitutional:      Appearance: Normal appearance.  Cardiovascular:     Rate and Rhythm: Normal rate and regular rhythm.     Pulses: Normal pulses.     Heart sounds: Normal heart sounds. No murmur heard.   No friction rub. No gallop.  Pulmonary:     Effort: Pulmonary effort is normal. No respiratory distress.     Breath sounds: Normal breath sounds. No stridor. No wheezing, rhonchi or rales.  Genitourinary:   Neurological:     General: No focal deficit present.     Mental Status: He is alert. Mental status is at baseline.  Psychiatric:        Mood and Affect: Mood normal.        Behavior: Behavior normal.        Thought Content: Thought content normal.        Judgment: Judgment normal.    BP 122/80   Pulse 71   Temp 98.1 F (36.7 C)   Ht 5' 10"  (1.778 m)   Wt 165 lb (74.8 kg)   SpO2 98%   BMI 23.68 kg/m  Wt Readings from Last 3 Encounters:  01/29/21 165 lb (74.8 kg)  01/08/21 170 lb 6.4 oz (77.3 kg)  11/27/20 176 lb 4 oz (79.9 kg)    Health Maintenance Due  Topic Date Due   HIV Screening  Never done    There are no preventive care reminders to display for this patient.   Lab Results  Component Value Date   TSH 1.87 11/27/2020   Lab Results  Component Value Date   WBC 5.1 11/27/2020   HGB 12.8 (L) 11/27/2020   HCT 38.1 (L)  11/27/2020   MCV 97.1 11/27/2020   PLT 248.0 11/27/2020   Lab Results  Component Value Date   NA 139 01/22/2021   K 4.3 01/22/2021   CO2 21 01/22/2021   GLUCOSE 110 (H) 01/22/2021   BUN 22 01/22/2021   CREATININE 1.13 01/22/2021   BILITOT 0.3 11/27/2020   ALKPHOS 49 11/27/2020   AST 18 11/27/2020   ALT 25 11/27/2020   PROT 5.9 (L) 11/27/2020   ALBUMIN 3.8 11/27/2020   CALCIUM 9.2 01/22/2021   EGFR 72 01/22/2021   GFR 70.54 11/27/2020   Lab Results  Component Value Date   CHOL 140 11/27/2020   Lab Results  Component Value Date   HDL 44.40 11/27/2020   Lab Results  Component Value Date   LDLCALC 76 11/27/2020   Lab Results  Component Value Date   TRIG 96.0 11/27/2020   Lab Results  Component Value Date   CHOLHDL 3 11/27/2020   Lab Results  Component Value Date   HGBA1C 6.0 (H) 01/08/2021       Assessment & Plan:   Problem List Items Addressed This Visit   None Visit Diagnoses     Perirectal abscess    -  Primary   Relevant Medications   doxycycline (VIBRA-TABS) 100 MG tablet        Meds ordered this encounter  Medications   doxycycline (VIBRA-TABS) 100 MG tablet    Sig: Take 1 tablet (100 mg total) by mouth 2 (two) times daily.    Dispense:  14 tablet    Refill:  0    Order Specific Question:   Supervising Provider    Answer:   Carlota Raspberry, JEFFREY R [[id number]]   PLAN Given sudden swelling in past few days, will tx with abx.  No distinct drainable lesion, will defer drainage Suggest OTC for pain relief Patient encouraged to call clinic with any questions, comments, or concerns.   Maximiano Coss, NP

## 2021-02-08 NOTE — Progress Notes (Signed)
Pt aware of lab results ./cy 

## 2021-02-13 ENCOUNTER — Other Ambulatory Visit: Payer: Self-pay

## 2021-02-13 ENCOUNTER — Ambulatory Visit (INDEPENDENT_AMBULATORY_CARE_PROVIDER_SITE_OTHER): Payer: Medicare Other | Admitting: Pharmacist

## 2021-02-13 VITALS — BP 130/84 | HR 67

## 2021-02-13 DIAGNOSIS — I509 Heart failure, unspecified: Secondary | ICD-10-CM | POA: Diagnosis not present

## 2021-02-13 DIAGNOSIS — I5022 Chronic systolic (congestive) heart failure: Secondary | ICD-10-CM

## 2021-02-13 LAB — BASIC METABOLIC PANEL
BUN/Creatinine Ratio: 19 (ref 10–24)
BUN: 23 mg/dL (ref 8–27)
CO2: 24 mmol/L (ref 20–29)
Calcium: 9.2 mg/dL (ref 8.6–10.2)
Chloride: 99 mmol/L (ref 96–106)
Creatinine, Ser: 1.24 mg/dL (ref 0.76–1.27)
Glucose: 99 mg/dL (ref 65–99)
Potassium: 4.9 mmol/L (ref 3.5–5.2)
Sodium: 138 mmol/L (ref 134–144)
eGFR: 65 mL/min/{1.73_m2} (ref >59)

## 2021-02-13 MED ORDER — ENTRESTO 97-103 MG PO TABS: 1.0000 | ORAL_TABLET | Freq: Two times a day (BID) | ORAL | 3 refills | Status: DC

## 2021-02-13 NOTE — Patient Instructions (Signed)
It nice to see you! Please increase your Entresto to 97/103mg  twice a day. You may double up on your 49/51mg  tablets until they are gone.  Continue checking blood pressure at home  Call me at [phone] with any questions

## 2021-02-13 NOTE — Progress Notes (Signed)
Patient ID: [name]                 DOB: May 31, 1956                      MRN: [id number]     HPI: [name] is a 65 y.o. male referred by Dr. Johnsie Cancel to pharmacy clinic for HF medication management. PMH is significant for AML s/p BMT x 2, HLD, chronic systolic CHF. Most recent LVEF 45-50% on 09/14/20.  Today he presents to pharmacy clinic for further medication titration. At last visit with me Jardiance 10mg  daily was added.  Patient presents today for follow up. He denies dizziness, lightheadedness, headache, blurred vision, SOB or swelling. His gum issues have resolved. Was possibly exposed to [place] on Saturday (his son in law tested positive yesterday) He has no symptoms, is feeling fine. Is vaccinated.  Home blood pressures are above goal. Tried to take when he is relaxed.   Current CHF meds: Entresto 49/51mg  twice a day, metoprolol succinate 12.5mg  daily, Jardiance 10mg  daily BP goal: <130/80  Family History:  Family History  Problem Relation Age of Onset   Hypertension Father    Heart disease Father    Prostate cancer Father        dx late 7s   Cancer Father 28       prostate ca   Celiac disease Mother    Colon polyps Mother    Breast cancer Mother    Cancer Mother        Bladder   Colon cancer Neg Hx    Esophageal cancer Neg Hx    Inflammatory bowel disease Neg Hx    Liver disease Neg Hx    Pancreatic cancer Neg Hx    Rectal cancer Neg Hx    Stomach cancer Neg Hx     Social History: never smoked, occassonal ETOH  Diet: not discussed  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  Past Medical History:  Diagnosis Date   Acute myeloid leukemia in remission ([place]) 1999   Anemia    Arthritis    Depression    Diverticulosis of colon (without mention of hemorrhage)    Diverticulosis of colon (without mention of hemorrhage) 04/21/2011   Esophageal reflux    Family hx colonic polyps    Family hx colonic polyps 04/21/2011   Hiatal hernia    Hiatal hernia 04/21/2011   History of kidney stones    Hyperlipemia    Hypertension    MDS (myelodysplastic syndrome) ([place])    Psychosexual dysfunction with inhibited sexual excitement     Current Outpatient Medications on File Prior to Visit  Medication Sig Dispense Refill   aspirin EC 81 MG tablet Take 1 tablet (81 mg total) by mouth daily. Swallow whole. 90 tablet 3   B Complex Vitamins (VITAMIN B COMPLEX)  TABS Take by mouth.     calcium-vitamin D (OSCAL WITH D) 500-200 MG-UNIT TABS tablet Take by mouth.     doxycycline (VIBRA-TABS) 100 MG tablet Take 1 tablet (100 mg total) by mouth 2 (two) times daily. 14 tablet 0   empagliflozin (JARDIANCE) 10 MG TABS tablet Take 1 tablet (10 mg total) by mouth daily. 30 tablet 11   EPIPEN 2-PAK 0.3 MG/0.3ML DEVI Inject 0.3 mg into the muscle as needed.      fluticasone (FLONASE) 50 MCG/ACT nasal spray Place into both nostrils daily.     metoprolol succinate (TOPROL XL) 25 MG 24 hr tablet Take 0.5 tablets (12.5 mg total) by mouth daily. 30 tablet 0   Nutritional Supplements (JUICE PLUS FIBRE PO) Take by mouth. 2 tabs twice daily     omeprazole (PRILOSEC) 20 MG capsule Take 20 mg by mouth daily.     pravastatin (PRAVACHOL) 20 MG tablet Take 20 mg by mouth daily.     sacubitril-valsartan (ENTRESTO) 49-51 MG Take 1 tablet by mouth 2 (two) times daily. 60 tablet 11   sertraline (ZOLOFT) 50 MG tablet Take 1 tablet (50  mg total) by mouth daily. 90 tablet 3   sildenafil (VIAGRA) 100 MG tablet Take 0.5 tablets (50 mg total) by mouth as needed for erectile dysfunction. 30 tablet 6   valACYclovir (VALTREX) 500 MG tablet Take 1 tablet (500 mg total) by mouth 2 (two) times daily. 1 tablet twice daily for 1 day for each  episode of herpes simplex 20 tablet 2   No current facility-administered medications on file prior to visit.    Allergies  Allergen Reactions   Morphine Other (See Comments)    UNKNOWN - REACTION IS NOT LISTED   Marinol [Dronabinol]     Delusions.     Assessment/Plan:  1. CHF - BP is above goal of <130/80 today in clinic. He is on 3 of the 4 HF medications. We discussed the evidence with these medications in mildly reduced HF (2b recommendation). Advised we could add spironolactone or increase Entresto. Patient prefers not to add an additional med at this time. Will increase Entresto to 97/103mg  twice a day. Continue metoprolol succinate 12.5mg  daily and Jardiance 10mg  daily. Checking BMP today since adding Jardiance. Follow up in 2 weeks in clinic.  Thank you,  Ramond Dial, Pharm.D, BCPS, CPP [facility]  [address] [place], [place] [address]  Phone: [phone]; Fax: [phone]

## 2021-02-25 ENCOUNTER — Encounter: Payer: Self-pay | Admitting: Family Medicine

## 2021-02-25 DIAGNOSIS — R739 Hyperglycemia, unspecified: Secondary | ICD-10-CM

## 2021-02-27 ENCOUNTER — Ambulatory Visit: Payer: Medicare Other | Admitting: Family Medicine

## 2021-02-28 ENCOUNTER — Ambulatory Visit (INDEPENDENT_AMBULATORY_CARE_PROVIDER_SITE_OTHER): Payer: Medicare Other | Admitting: Pharmacist

## 2021-02-28 VITALS — BP 124/74 | HR 73

## 2021-02-28 DIAGNOSIS — R739 Hyperglycemia, unspecified: Secondary | ICD-10-CM

## 2021-02-28 DIAGNOSIS — I509 Heart failure, unspecified: Secondary | ICD-10-CM | POA: Diagnosis not present

## 2021-02-28 DIAGNOSIS — I5022 Chronic systolic (congestive) heart failure: Secondary | ICD-10-CM

## 2021-02-28 NOTE — Progress Notes (Signed)
Patient ID: [name]                 DOB: March 02, 1956                      MRN: [id number]     HPI: [name] is a 65 y.o. male referred by Dr. Johnsie Cancel to pharmacy clinic for HF medication management. PMH is significant for Acute Myloid Leukemia s/p BMT x 2, HLD, chronic systolic CHF. Most recent LVEF 45-50% on 09/14/20.  Patient presents today for follow up. At last visit with me Delene Loll was increased to 97/103mg  twice a day due to elevated BP. He denies dizziness, lightheadedness, headache, blurred vision, SOB or swelling. He had an hour long golf lesson yesterday in the heat. States he felt fine, no dizziness, SOB. Home BP has been at goal the last week. Checking A1C today.  Current CHF meds: Entresto 97/103mg  twice a day, metoprolol succinate 12.5mg  daily, Jardiance 10mg  daily BP goal: <130/80  Family History:  Family History  Problem Relation Age of Onset   Hypertension Father    Heart disease Father    Prostate cancer Father        dx late 24s   Cancer Father 3       prostate ca   Celiac disease Mother    Colon polyps Mother    Breast cancer Mother    Cancer Mother        Bladder   Colon cancer Neg Hx    Esophageal cancer Neg Hx    Inflammatory bowel disease Neg Hx    Liver disease Neg Hx    Pancreatic cancer Neg Hx    Rectal cancer Neg Hx    Stomach cancer Neg Hx     Social History: never smoked, occassonal ETOH  Diet: not discussed  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  Past Medical History:  Diagnosis Date   Acute myeloid leukemia in remission ([place]) 1999   Anemia    Arthritis    Depression    Diverticulosis of colon (without mention of hemorrhage)    Diverticulosis of colon (without mention of hemorrhage) 04/21/2011   Esophageal reflux    Family hx colonic polyps    Family hx colonic polyps 04/21/2011   Hiatal hernia    Hiatal hernia 04/21/2011   History of kidney stones    Hyperlipemia    Hypertension    MDS (myelodysplastic syndrome) ([place])    Psychosexual dysfunction with inhibited sexual excitement     Current Outpatient Medications on File Prior to Visit  Medication Sig Dispense Refill   aspirin EC 81 MG tablet Take 1 tablet (81 mg total) by mouth daily. Swallow whole. 90 tablet 3   B Complex Vitamins (VITAMIN B COMPLEX) TABS Take by mouth.     calcium-vitamin D (OSCAL WITH D) 500-200 MG-UNIT TABS tablet Take by mouth.  doxycycline (VIBRA-TABS) 100 MG tablet Take 1 tablet (100 mg total) by mouth 2 (two) times daily. 14 tablet 0   empagliflozin (JARDIANCE) 10 MG TABS tablet Take 1 tablet (10 mg total) by mouth daily. 30 tablet 11   EPIPEN 2-PAK 0.3 MG/0.3ML DEVI Inject 0.3 mg into the muscle as needed.      fluticasone (FLONASE) 50 MCG/ACT nasal spray Place into both nostrils daily.     metoprolol succinate (TOPROL XL) 25 MG 24 hr tablet Take 0.5 tablets (12.5 mg total) by mouth daily. 30 tablet 0   Nutritional Supplements (JUICE PLUS FIBRE PO) Take by mouth. 2 tabs twice daily     omeprazole (PRILOSEC) 20 MG capsule Take 20 mg by mouth daily.     pravastatin (PRAVACHOL) 20 MG tablet Take 20 mg by mouth daily.     sacubitril-valsartan (ENTRESTO) 97-103 MG Take 1 tablet by mouth 2 (two) times daily. 180 tablet 3   sertraline (ZOLOFT) 50 MG tablet Take 1 tablet (50 mg total) by mouth daily. 90 tablet 3   sildenafil (VIAGRA) 100 MG tablet Take 0.5 tablets (50 mg total) by mouth as needed for erectile  dysfunction. 30 tablet 6   valACYclovir (VALTREX) 500 MG tablet Take 1 tablet (500 mg total) by mouth 2 (two) times daily. 1 tablet twice daily for 1 day for each  episode of herpes simplex 20 tablet 2   No current facility-administered medications on file prior to visit.    Allergies  Allergen Reactions   Morphine Other (See Comments)    UNKNOWN - REACTION IS NOT LISTED   Marinol [Dronabinol]     Delusions.     Assessment/Plan:  1. CHF - BP is at goal of <130/80 today in clinic. He is on 3 of the 4 HF medications. We discussed the evidence with these medications in mildly reduced HF (2b recommendation). Advised we could add spironolactone or increase metoprolol (fatigue when started at 25mg ). Patient prefers not to make any changes at this time. I agree as patient is asymptomatic. Will reassess EF with MRI in Sept. Can make further changes at that time if needed. Follow up with me prn. Follow up with Dr. Johnsie Cancel in Sept.  Thank you,  Ramond Dial, Pharm.D, BCPS, CPP [facility]  [address] [place], [place] [address]  Phone: [phone]; Fax: [phone]

## 2021-02-28 NOTE — Patient Instructions (Addendum)
Continue Entresto 97/103mg  twice a day, metoprolol succinate 12.5mg  daily, Jardiance 10mg  daily  Call me at [phone] with any questions  Follow up as needed

## 2021-03-01 LAB — HEMOGLOBIN A1C
Est. average glucose Bld gHb Est-mCnc: 128 mg/dL
Hgb A1c MFr Bld: 6.1 % — ABNORMAL HIGH (ref 4.8–5.6)

## 2021-04-11 ENCOUNTER — Telehealth: Payer: Self-pay | Admitting: Cardiovascular Disease

## 2021-04-11 NOTE — Telephone Encounter (Signed)
Patient is going to be traveling out of town on that day. Rescheduled patient at a later date in September.

## 2021-04-11 NOTE — Telephone Encounter (Signed)
Pt would like to r/s his telephone office appt Please advise pt further

## 2021-04-11 NOTE — Telephone Encounter (Signed)
Left message for patient to call back  

## 2021-04-18 ENCOUNTER — Telehealth: Payer: Medicare Other | Admitting: Cardiovascular Disease

## 2021-05-07 ENCOUNTER — Other Ambulatory Visit: Payer: Self-pay

## 2021-05-07 ENCOUNTER — Encounter: Payer: Self-pay | Admitting: Cardiovascular Disease

## 2021-05-07 ENCOUNTER — Ambulatory Visit (INDEPENDENT_AMBULATORY_CARE_PROVIDER_SITE_OTHER): Payer: Medicare Other | Admitting: Cardiovascular Disease

## 2021-05-07 VITALS — BP 122/62 | HR 78 | Ht 70.0 in | Wt 170.2 lb

## 2021-05-07 DIAGNOSIS — R233 Spontaneous ecchymoses: Secondary | ICD-10-CM

## 2021-05-07 DIAGNOSIS — R238 Other skin changes: Secondary | ICD-10-CM | POA: Diagnosis not present

## 2021-05-07 DIAGNOSIS — I509 Heart failure, unspecified: Secondary | ICD-10-CM

## 2021-05-07 DIAGNOSIS — I5022 Chronic systolic (congestive) heart failure: Secondary | ICD-10-CM

## 2021-05-07 DIAGNOSIS — I429 Cardiomyopathy, unspecified: Secondary | ICD-10-CM

## 2021-05-07 LAB — CBC WITH DIFFERENTIAL/PLATELET
Basophils Absolute: 0 10*3/uL (ref 0.0–0.2)
Basos: 0 %
EOS (ABSOLUTE): 0.1 10*3/uL (ref 0.0–0.4)
Eos: 3 %
Hematocrit: 41.1 % (ref 37.5–51.0)
Hemoglobin: 13.6 g/dL (ref 13.0–17.7)
Immature Grans (Abs): 0 10*3/uL (ref 0.0–0.1)
Immature Granulocytes: 0 %
Lymphocytes Absolute: 1.9 10*3/uL (ref 0.7–3.1)
Lymphs: 38 %
MCH: 31.9 pg (ref 26.6–33.0)
MCHC: 33.1 g/dL (ref 31.5–35.7)
MCV: 96 fL (ref 79–97)
Monocytes Absolute: 0.5 10*3/uL (ref 0.1–0.9)
Monocytes: 10 %
Neutrophils Absolute: 2.4 10*3/uL (ref 1.4–7.0)
Neutrophils: 49 %
Platelets: 321 10*3/uL (ref 150–450)
RBC: 4.27 x10E6/uL (ref 4.14–5.80)
RDW: 12.2 % (ref 11.6–15.4)
WBC: 5 10*3/uL (ref 3.4–10.8)

## 2021-05-07 NOTE — Progress Notes (Signed)
CARDIOLOGY CONSULT NOTE     Patient ID: [name] MRN: [id number] DOB/AGE: [birth year] 65 y.o.  Admit date: (Not on file) Referring Physician: [name] Primary Physician: [name], MD Primary Cardiologist: Johnsie Cancel Reason for Consultation: CAD    HPI: 65 y.o. referred by Dr [name] 10/12/20  for family history of CAD, HTN and chronic systolic CHF   History of AML with BMT in 2000.  Diagnosed with MDS in 2020 and had repeat transplant 06/08/19  Post 2nd tranplant had pulmonary edema ? From moderate MR not clear what EF was at that time although notes indicate patient concerned recently that EF had gone done from 50-55% and his HR has been running higher He has seen [facility] cardiology [facility] and there note indicates starting ACE 06/15/20 with plan to add beta blocker and stopped norvasc.    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 Has a son and daughter working with him All of them went to [employer]. Enjoys golf and has 3 grand kids.  Met his wife of over 41 years at [employer] as well  Cardiac CT 12/03/20 calcium score 120 58 th percentile CAD RADS one non obstructive CAD  Discussed F/U cardiac MRI to check EF on optimized medical Rx   Some easy bruising in arms wants CBC with PLT checked Has f/u with BM doctors at [facility] In October    ROS All other systems reviewed and negative except as noted above  Past Medical History:  Diagnosis Date   Acute myeloid leukemia in remission ([place]) 1999   Anemia    Arthritis    Depression     Diverticulosis of colon (without mention of hemorrhage)    Diverticulosis of colon (without mention of hemorrhage) 04/21/2011   Esophageal reflux    Family hx colonic polyps    Family hx colonic polyps 04/21/2011   Hiatal hernia    Hiatal hernia 04/21/2011   History of kidney stones    Hyperlipemia    Hypertension    MDS (myelodysplastic syndrome) ([place])    Psychosexual dysfunction with inhibited sexual excitement     Family History  Problem Relation Age of Onset   Hypertension Father    Heart disease Father    Prostate cancer Father        dx late 48s   Cancer Father 25       prostate ca   Celiac disease Mother    Colon polyps Mother    Breast cancer Mother    Cancer Mother        Bladder   Colon cancer Neg Hx    Esophageal cancer Neg Hx    Inflammatory bowel disease Neg Hx    Liver disease Neg Hx    Pancreatic cancer Neg Hx    Rectal cancer Neg Hx    Stomach cancer Neg Hx     Social History   Socioeconomic History   Marital status: Married    Spouse name: Not on file   Number of  children: 2   Years of education: Not on file   Highest education level: Not on file  Occupational History   Occupation: self employed    Employer: [employer]  Tobacco Use   Smoking status: Never   Smokeless tobacco: Never  Vaping Use   Vaping Use: Never used  Substance and Sexual Activity   Alcohol use: Yes    Comment: one occ    Drug use: No   Sexual activity: Never  Other Topics Concern   Not on file  Social History Narrative   2 caffeine drinks daily    Social Determinants of Health   Financial Resource Strain: Not on file  Food Insecurity: Not on file  Transportation Needs: Not on file  Physical Activity: Not on file  Stress: Not on file  Social Connections: Not on file  Intimate Partner Violence: Not on file    Past Surgical History:  Procedure Laterality Date   SHOULDER SURGERY     x 3       Current Outpatient Medications:    Ascorbic Acid (VITAMIN C) 500  MG CAPS, Take 500 mg by mouth daily., Disp: , Rfl:    aspirin EC 81 MG tablet, Take 1 tablet (81 mg total) by mouth daily. Swallow whole., Disp: 90 tablet, Rfl: 3   B Complex Vitamins (VITAMIN B COMPLEX) TABS, Take by mouth., Disp: , Rfl:    calcium-vitamin D (OSCAL WITH D) 500-200 MG-UNIT TABS tablet, Take by mouth., Disp: , Rfl:    Cyanocobalamin (VITAMIN B12) 1000 MCG TBCR, 1 tablet, Disp: , Rfl:    empagliflozin (JARDIANCE) 10 MG TABS tablet, Take 1 tablet (10 mg total) by mouth daily., Disp: 30 tablet, Rfl: 11   EPIPEN 2-PAK 0.3 MG/0.3ML DEVI, Inject 0.3 mg into the muscle as needed. , Disp: , Rfl:    fluticasone (FLONASE) 50 MCG/ACT nasal spray, Place into both nostrils daily., Disp: , Rfl:    loratadine (CLARITIN REDITABS) 10 MG dissolvable tablet, Take 1 tablet by mouth as needed., Disp: , Rfl:    metoprolol succinate (TOPROL XL) 25 MG 24 hr tablet, Take 0.5 tablets (12.5 mg total) by mouth daily., Disp: 30 tablet, Rfl: 0   Nutritional Supplements (JUICE PLUS FIBRE PO), Take by mouth. 2 tabs twice daily, Disp: , Rfl:    omeprazole (PRILOSEC) 20 MG capsule, Take 20 mg by mouth daily., Disp: , Rfl:    pravastatin (PRAVACHOL) 20 MG tablet, Take 20 mg by mouth daily., Disp: , Rfl:    sacubitril-valsartan (ENTRESTO) 97-103 MG, Take 1 tablet by mouth 2 (two) times daily., Disp: 180 tablet, Rfl: 3   sertraline (ZOLOFT) 50 MG tablet, Take 1 tablet (50 mg total) by mouth daily., Disp: 90 tablet, Rfl: 3   sildenafil (VIAGRA) 100 MG tablet, Take 0.5 tablets (50 mg total) by mouth as needed for erectile dysfunction., Disp: 30 tablet, Rfl: 6   valACYclovir (VALTREX) 500 MG tablet, Take 1 tablet (500 mg total) by mouth 2 (two) times daily. 1 tablet twice daily for 1 day for each  episode of herpes simplex (Patient taking differently: Take 500 mg by mouth as needed. 1 tablet twice daily for 1 day for each  episode of herpes simplex), Disp: 20 tablet, Rfl: 2   doxycycline (VIBRA-TABS) 100 MG tablet, Take 1  tablet (100 mg total) by mouth 2 (two) times daily. (Patient not taking: Reported on 05/07/2021), Disp: 14 tablet, Rfl: 0    Physical Exam: Blood pressure 122/62, pulse 78, height 5'  10" (1.778 m), weight 77.2 kg, SpO2 98 %.   Affect appropriate Healthy:  appears stated age 80: normal Neck supple with no adenopathy JVP normal no bruits no thyromegaly Lungs clear with no wheezing and good diaphragmatic motion Heart:  S1/S2 no murmur, no rub, gallop or click PMI normal Abdomen: benighn, BS positve, no tenderness, no AAA no bruit.  No HSM or HJR Distal pulses intact with no bruits No edema Neuro non-focal Bilateral knee arthritis     Labs:   Lab Results  Component Value Date   WBC 5.1 11/27/2020   HGB 12.8 (L) 11/27/2020   HCT 38.1 (L) 11/27/2020   MCV 97.1 11/27/2020   PLT 248.0 11/27/2020   No results for input(s): NA, K, CL, CO2, BUN, CREATININE, CALCIUM, PROT, BILITOT, ALKPHOS, ALT, AST, GLUCOSE in the last 168 hours.  Invalid input(s): LABALBU Lab Results  Component Value Date   CKTOTAL 210 03/18/2012   CKMB 3.9 03/18/2012   TROPONINI <0.30 03/18/2012    Lab Results  Component Value Date   CHOL 140 11/27/2020   CHOL 204 (H) 11/22/2019   CHOL 176 10/27/2018   Lab Results  Component Value Date   HDL 44.40 11/27/2020   HDL 48.40 11/22/2019   HDL 61.00 10/27/2018   Lab Results  Component Value Date   LDLCALC 76 11/27/2020   LDLCALC 130 (H) 11/22/2019   LDLCALC 98 10/27/2018   Lab Results  Component Value Date   TRIG 96.0 11/27/2020   TRIG 128.0 11/22/2019   TRIG 84.0 10/27/2018   Lab Results  Component Value Date   CHOLHDL 3 11/27/2020   CHOLHDL 4 11/22/2019   CHOLHDL 3 10/27/2018   Lab Results  Component Value Date   LDLDIRECT 146.2 07/29/2012   LDLDIRECT 136.9 05/12/2011   LDLDIRECT 134.5 01/29/2010      Radiology: No results found.   EKG: 2014 SR LAFB LVH  05/07/2021 NSR LAFB LVH no changes    ASSESSMENT AND PLAN:   1. Family  History CAD:  average calcium score no obstructive dx on CT 12/03/20 2. HLD:  On statin LDL 76 11/27/20  3. AML/BMT:  Stable post transplant x 2 4. Chronic Systolic CHF: ? Related to Rx for cancer On optimal medical Rx now f/u cardiac MRI to reassess EF Discussed trying to increase his beta blocker to 25 mg he had felt more fatigued initially on this dose Would prefer HR to be in 60's He has no volume overload what so ever and do not feel aldactone needed  5. Ortho:  bilateral knee issues f/u [facility] post arthroscopic surgery on right and likely will have left done after golf season   Cardiac MRI 09/2021 CBC today  F/U in a year   Time:  spent reviewing chart , echo cardiac CT direct patient interview and composing note 22 minutes   Signed: Jenkins Rouge 05/07/2021, 9:01 AM

## 2021-05-07 NOTE — Patient Instructions (Signed)
Medication Instructions:  Your physician recommends that you continue on your current medications as directed. Please refer to the Current Medication list given to you today.  *If you need a refill on your cardiac medications before your next appointment, please call your pharmacy*   Lab Work: [place]  Your physician recommends that you return for lab work in: February for BMET prior to your Cardiac MRI  If you have labs (blood work) drawn today and your tests are completely normal, you will receive your results only by: MyChart Message (if you have MyChart) OR A paper copy in the mail If you have any lab test that is abnormal or we need to change your treatment, we will call you to review the results.   Testing/Procedures: Your physician has requested that you have a cardiac MRI. Cardiac MRI uses a computer to create images of your heart as its beating, producing both still and moving pictures of your heart and major blood vessels. For further information please visit [email or web address] Please follow the instruction sheet given to you today for more information.   Follow-Up: At [facility], you and your health needs are our priority.  As part of our continuing mission to provide you with exceptional heart care, we have created designated Provider Care Teams.  These Care Teams include your primary Cardiologist (physician) and Advanced Practice Providers (APPs -  Physician Assistants and Nurse Practitioners) who all work together to provide you with the care you need, when you need it.   Your next appointment:   5 month(s)  The format for your next appointment:   In Person  Provider:   You may see Jenkins Rouge, MD or one of the following Advanced Practice Providers on your designated Care Team:   Cecilie Kicks, NP

## 2021-05-08 ENCOUNTER — Encounter: Payer: Self-pay | Admitting: Family Medicine

## 2021-05-09 ENCOUNTER — Ambulatory Visit: Payer: Medicare Other | Admitting: Cardiovascular Disease

## 2021-05-09 NOTE — Telephone Encounter (Signed)
Last refilled by historical provider  

## 2021-05-10 MED ORDER — PRAVASTATIN SODIUM 20 MG PO TABS: 20.0000 mg | ORAL_TABLET | Freq: Every day | ORAL | 1 refills | Status: DC

## 2021-05-16 ENCOUNTER — Encounter: Payer: Self-pay | Admitting: Family Medicine

## 2021-05-16 ENCOUNTER — Telehealth (INDEPENDENT_AMBULATORY_CARE_PROVIDER_SITE_OTHER): Payer: Medicare Other | Admitting: Family Medicine

## 2021-05-16 ENCOUNTER — Telehealth: Payer: Self-pay

## 2021-05-16 VITALS — Temp 100.8°F | Wt 163.0 lb

## 2021-05-16 DIAGNOSIS — R6889 Other general symptoms and signs: Secondary | ICD-10-CM

## 2021-05-16 MED ORDER — OSELTAMIVIR PHOSPHATE 75 MG PO CAPS: 75.0000 mg | ORAL_CAPSULE | Freq: Two times a day (BID) | ORAL | 0 refills | Status: DC

## 2021-05-16 MED ORDER — BENZONATATE 200 MG PO CAPS: 200.0000 mg | ORAL_CAPSULE | Freq: Two times a day (BID) | ORAL | 0 refills | Status: DC | PRN

## 2021-05-16 NOTE — Telephone Encounter (Signed)
FYI-Dr M  Contacted patient today- per the patient he will now be seeing Dr. Watt Climes at [facility]. Pt states that he had no issues with Dr Rush Landmark or our office, but his daughter is good friends with Dr. Perley Jain family and they a personal relationship with them. I advise patient that we would remove his colon/egd recall from our system and wished him well.

## 2021-05-16 NOTE — Addendum Note (Signed)
Addended by: Agnes Lawrence on: 05/16/2021 01:55 PM   Modules accepted: Orders

## 2021-05-16 NOTE — Addendum Note (Signed)
Addended by: Amanda Cockayne on: 05/16/2021 02:33 PM   Modules accepted: Orders

## 2021-05-16 NOTE — Patient Instructions (Signed)
  HOME CARE TIPS:  -I sent a message to the [place] office to see if they can do the requested testing. If they are unable to do a repeat covid test for your or flu testing, or if they do not contact you in the next 1-2 hours, please seek testing at a minute clinic or walk in clinic as we discussed.   -COVID19 testing information: ForwardDrop.tn  Most pharmacies also offer testing and home test kits. If the Covid19 test is positive, STOP the Tamiflu,  and if you desire antiviral treatment, please contact a [facility] or schedule a follow up virtual visit through your primary care office or through the [facility].  Other test to treat options: ConnectRV.is?click_source=alert  -I sent the medication(s) we discussed to your pharmacy: Meds ordered this encounter  Medications   oseltamivir (TAMIFLU) 75 MG capsule    Sig: Take 1 capsule (75 mg total) by mouth 2 (two) times daily.    Dispense:  10 capsule    Refill:  0   benzonatate (TESSALON) 200 MG capsule    Sig: Take 1 capsule (200 mg total) by mouth 2 (two) times daily as needed.    Dispense:  20 capsule    Refill:  0     -can use tylenol or aleve if needed for fevers, aches and pains per instructions  -can use nasal saline a few times per day if you have nasal congestion  -stay hydrated, drink plenty of fluids and eat small healthy meals - avoid dairy  -If the Covid test is positive, check out the [facility] website for more information on home care, transmission and treatment for COVID19  -follow up with your doctor in 2-3 days unless improving and feeling better  -stay home while sick, except to seek medical care. If you have COVID19, ideally it would be best to stay home for a full 10 days since the onset of symptoms PLUS one day of no fever and feeling better. Wear a good mask that fits snugly (such as N95 or KN95) if around others to reduce the risk of  transmission.  It was nice to meet you today, and I really hope you are feeling better soon. I help [facility] out with telemedicine visits on Tuesdays and Thursdays and am available for visits on those days. If you have any concerns or questions following this visit please schedule a follow up visit with your Primary Care doctor or seek care at a local urgent care clinic to avoid delays in care.    Seek in person care or schedule a follow up video visit promptly if your symptoms worsen, new concerns arise or you are not improving with treatment. Call 911 and/or seek emergency care if your symptoms are severe or life threatening.

## 2021-05-16 NOTE — Telephone Encounter (Signed)
Thank you for update. GM 

## 2021-05-16 NOTE — Progress Notes (Signed)
Virtual Visit via Video Note  I connected with [name]  on 05/16/21 at  1:20 PM EDT by a video enabled telemedicine application and verified that I am speaking with the correct person using two identifiers.  Location patient: home, [place] Location provider:work or home office Persons participating in the virtual visit: patient, provider  I discussed the limitations of evaluation and management by telemedicine and the availability of in person appointments. The patient expressed understanding and agreed to proceed.   HPI:  Acute telemedicine visit for flu like symptoms: -Onset: 3 days ago -covid test at home was negative yesterday -Symptoms include: nasal congestion, cough, low grade fevers, sore throat initially, felt tired -Denies:CP, SOB, NVD, inability to eat/drink/get out of bed -Has tried:dayquil/nyquil -Pertinent past medical history: see below, reports leukemia is in remission -reports his oncologist advised inperson covid testing -Pertinent medication allergies:  Allergies  Allergen Reactions   Morphine Other (See Comments)    UNKNOWN - REACTION IS NOT LISTED   Marinol [Dronabinol]     Delusions.  -COVID-19 vaccine status: vaccinated and boosted x3; has not had flu shot this year  ROS: See pertinent positives and negatives per HPI.  Past Medical History:  Diagnosis Date   Acute myeloid leukemia in remission ([place]) 1999   Anemia    Arthritis    Depression    Diverticulosis of colon (without mention of hemorrhage)    Diverticulosis of colon (without mention of hemorrhage) 04/21/2011   Esophageal reflux    Family hx colonic polyps    Family hx colonic polyps 04/21/2011   Hiatal hernia    Hiatal hernia 04/21/2011   History of kidney stones    Hyperlipemia    Hypertension    MDS (myelodysplastic syndrome) ([place])    Psychosexual dysfunction with inhibited sexual excitement     Past Surgical History:  Procedure Laterality Date   SHOULDER SURGERY     x 3      Current  Outpatient Medications:    Ascorbic Acid (VITAMIN C) 500 MG CAPS, Take 500 mg by mouth daily., Disp: , Rfl:    aspirin EC 81 MG tablet, Take 1 tablet (81 mg total) by mouth daily. Swallow whole., Disp: 90 tablet, Rfl: 3   B Complex Vitamins (VITAMIN B COMPLEX) TABS, Take by mouth., Disp: , Rfl:    benzonatate (TESSALON) 200 MG capsule, Take 1 capsule (200 mg total) by mouth 2 (two) times daily as needed., Disp: 20 capsule, Rfl: 0   calcium-vitamin D (OSCAL WITH D) 500-200 MG-UNIT TABS tablet, Take by mouth., Disp: , Rfl:    Cyanocobalamin (VITAMIN B12) 1000 MCG TBCR, 1 tablet, Disp: , Rfl:    empagliflozin (JARDIANCE) 10 MG TABS tablet, Take 1 tablet (10 mg total) by mouth daily., Disp: 30 tablet, Rfl: 11   EPIPEN 2-PAK 0.3 MG/0.3ML DEVI, Inject 0.3 mg into the muscle as needed. , Disp: , Rfl:    fluticasone (FLONASE) 50 MCG/ACT nasal spray, Place into both nostrils daily., Disp: , Rfl:    ipratropium (ATROVENT) 0.03 % nasal spray, Place 2 sprays into both nostrils as needed for rhinitis., Disp: , Rfl:    loratadine (CLARITIN REDITABS) 10 MG dissolvable tablet, Take 1 tablet by mouth as needed., Disp: , Rfl:    metoprolol succinate (TOPROL XL) 25 MG 24 hr tablet, Take 0.5 tablets (12.5 mg total) by mouth daily. (Patient taking differently: Take 25 mg by mouth daily.), Disp: 30 tablet, Rfl: 0   Nutritional Supplements (JUICE PLUS FIBRE PO), Take by  mouth. 2 tabs twice daily, Disp: , Rfl:    omeprazole (PRILOSEC) 20 MG capsule, Take 20 mg by mouth daily., Disp: , Rfl:    oseltamivir (TAMIFLU) 75 MG capsule, Take 1 capsule (75 mg total) by mouth 2 (two) times daily., Disp: 10 capsule, Rfl: 0   pravastatin (PRAVACHOL) 20 MG tablet, Take 1 tablet (20 mg total) by mouth daily., Disp: 90 tablet, Rfl: 1   sacubitril-valsartan (ENTRESTO) 97-103 MG, Take 1 tablet by mouth 2 (two) times daily., Disp: 180 tablet, Rfl: 3   sertraline (ZOLOFT) 50 MG tablet, Take 1 tablet (50 mg total) by mouth daily., Disp: 90  tablet, Rfl: 3   sildenafil (VIAGRA) 100 MG tablet, Take 0.5 tablets (50 mg total) by mouth as needed for erectile dysfunction., Disp: 30 tablet, Rfl: 6   valACYclovir (VALTREX) 500 MG tablet, Take 1 tablet (500 mg total) by mouth 2 (two) times daily. 1 tablet twice daily for 1 day for each  episode of herpes simplex (Patient taking differently: Take 500 mg by mouth as needed. 1 tablet twice daily for 1 day for each  episode of herpes simplex), Disp: 20 tablet, Rfl: 2  EXAM:  VITALS per patient if applicable:  GENERAL: alert, oriented, appears well and in no acute distress  HEENT: atraumatic, conjunttiva clear, no obvious abnormalities on inspection of external nose and ears  NECK: normal movements of the head and neck  LUNGS: on inspection no signs of respiratory distress, breathing rate appears normal, no obvious gross SOB, gasping or wheezing  CV: no obvious cyanosis  MS: moves all visible extremities without noticeable abnormality  PSYCH/NEURO: pleasant and cooperative, no obvious depression or anxiety, speech and thought processing grossly intact  ASSESSMENT AND PLAN:  Discussed the following assessment and plan:  Flu-like symptoms  -we discussed possible serious and likely etiologies, options for evaluation and workup, limitations of telemedicine visit vs in person visit, treatment, treatment risks and precautions. Pt is agreeable to treatment via telemedicine at this moment. Query influenza (as have been seeing quite a few cases in the community lately), Covid19 vs other. He desires flu and covid testing. Sent message to [place] office to see if they can do testing. Advise if they do not contact him in the next 1-2 hours could do testing at minute clinic, walking clinic. He wanted to go ahead and start tamiflu  (discussed risks)while testing is pending and wanted rx for cough. If covid testing positive he agrees to stop tamiflu and advised can contact [facility] pharmacy or do a  follow up virtual visit for antiviral. Other symptomatic care measures summarized in pt instructions.  Advised to seek prompt in person care if worsening, new symptoms arise, or if is not improving with treatment. Discussed options for inperson care if PCP office not available. Did let this patient know that I only do telemedicine on Tuesdays and Thursdays for [place]. Advised to schedule follow up visit with PCP or [facility] if any further questions or concerns to avoid delays in care.   I discussed the assessment and treatment plan with the patient. The patient was provided an opportunity to ask questions and all were answered. The patient agreed with the plan and demonstrated an understanding of the instructions.     Lucretia Kern, DO

## 2021-05-18 LAB — COVID-19, FLU A+B AND RSV
Influenza A, NAA: NOT DETECTED
Influenza B, NAA: NOT DETECTED
RSV, NAA: NOT DETECTED
SARS-CoV-2, NAA: NOT DETECTED

## 2021-06-11 ENCOUNTER — Other Ambulatory Visit: Payer: Self-pay | Admitting: Cardiovascular Disease

## 2021-07-01 DIAGNOSIS — J019 Acute sinusitis, unspecified: Secondary | ICD-10-CM | POA: Insufficient documentation

## 2021-07-08 ENCOUNTER — Encounter: Payer: Self-pay | Admitting: Family Medicine

## 2021-08-13 ENCOUNTER — Encounter: Payer: Self-pay | Admitting: Cardiovascular Disease

## 2021-08-14 HISTORY — PX: KNEE SURGERY: SHX244

## 2021-10-01 LAB — HM COLONOSCOPY

## 2021-10-04 ENCOUNTER — Telehealth (HOSPITAL_COMMUNITY): Payer: Self-pay | Admitting: *Deleted

## 2021-10-04 NOTE — Telephone Encounter (Signed)
Attempted to call patient regarding upcoming cardiac MRI appointment. Left message on voicemail with name and callback number  Aniston Christman RN Navigator Cardiac Imaging [facility] [phone] Office [phone] Cell  

## 2021-10-05 NOTE — Progress Notes (Signed)
CARDIOLOGY CONSULT NOTE     Patient ID: [name] MRN: [id number] DOB/AGE: Sep 12, 1955 66 y.o.  Admit date: (Not on file) Referring Physician: [name] Primary Physician: [name], MD Primary Cardiologist: Johnsie Cancel Reason for Consultation: CAD    HPI: 66 y.o. referred by Dr [name] 10/12/20  for family history of CAD, HTN and chronic systolic CHF   History of AML with BMT in 2000.  Diagnosed with MDS in 2020 and had repeat transplant 06/08/19  Post 2nd tranplant had pulmonary edema ? From moderate MR not clear what EF was at that time although notes indicate patient concerned recently that EF had gone done from 50-55% and his HR has been running higher He has seen [facility] cardiology Babtist and there note indicates starting ACE 06/15/20 with plan to add beta blocker and stopped norvasc.    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 Has a son and daughter working with him All of them went to [employer]. Enjoys golf and has 3 grand kids.  Met his wife of over 52 years at [employer] as well  Cardiac CT 12/03/20 calcium score 120 58 th percentile CAD RADS one non obstructive CAD  He is having a lot of issues with recurrent allergies since his 2 nd transplant Has had recurrent ear infections and in general just feels miserable and anxious about it all Told him he should not stop his beta blocker as his allergist recommended as it is first line Rx for his DCM and if he needed an epi pen for allergic reaction it would still  work as intended      ROS All other systems reviewed and negative except as noted above  Past Medical History:  Diagnosis Date   Acute myeloid leukemia in remission ([place]) 1999   Anemia    Arthritis    Depression    Diverticulosis of colon (without mention of hemorrhage)    Diverticulosis of colon (without mention of hemorrhage) 04/21/2011   Esophageal reflux    Family hx colonic polyps    Family hx colonic polyps 04/21/2011   Hiatal hernia    Hiatal hernia 04/21/2011   History of kidney stones    Hyperlipemia    Hypertension    MDS (myelodysplastic syndrome) ([place])    Psychosexual dysfunction with inhibited sexual excitement     Family History  Problem Relation Age of Onset   Hypertension Father    Heart disease Father    Prostate cancer Father        dx late 21s   Cancer Father 57       prostate ca   Celiac disease Mother    Colon polyps Mother    Breast cancer Mother    Cancer Mother        Bladder   Colon cancer Neg Hx    Esophageal cancer Neg Hx    Inflammatory bowel disease Neg Hx    Liver disease Neg Hx    Pancreatic  cancer Neg Hx    Rectal cancer Neg Hx    Stomach cancer Neg Hx     Social History   Socioeconomic History   Marital status: Married    Spouse name: Not on file   Number of children: 2   Years of education: Not on file   Highest education level: Not on file  Occupational History   Occupation: self employed    Employer: [employer]  Tobacco Use   Smoking status: Never   Smokeless tobacco: Never  Vaping Use   Vaping Use: Never used  Substance and Sexual Activity   Alcohol use: Yes    Comment: one occ    Drug use: No   Sexual activity: Never  Other Topics Concern   Not on file  Social History Narrative   2 caffeine drinks daily    Social Determinants of Health   Financial Resource Strain: Not on file  Food Insecurity: Not on file  Transportation Needs: Not on file  Physical Activity: Not on file  Stress: Not on file  Social  Connections: Not on file  Intimate Partner Violence: Not on file    Past Surgical History:  Procedure Laterality Date   SHOULDER SURGERY     x 3       Current Outpatient Medications:    Ascorbic Acid (VITAMIN C) 500 MG CAPS, Take 500 mg by mouth daily., Disp: , Rfl:    B Complex Vitamins (VITAMIN B COMPLEX) TABS, Take by mouth., Disp: , Rfl:    calcium-vitamin D (OSCAL WITH D) 500-200 MG-UNIT TABS tablet, Take by mouth., Disp: , Rfl:    cetirizine (ZYRTEC) 10 MG tablet, Take 10 mg by mouth daily., Disp: , Rfl:    Cyanocobalamin (VITAMIN B12) 1000 MCG TBCR, 1 tablet, Disp: , Rfl:    empagliflozin (JARDIANCE) 10 MG TABS tablet, Take 1 tablet (10 mg total) by mouth daily., Disp: 30 tablet, Rfl: 11   fluticasone (FLONASE) 50 MCG/ACT nasal spray, Place into both nostrils daily., Disp: , Rfl:    ipratropium (ATROVENT) 0.03 % nasal spray, Place 2 sprays into both nostrils as needed for rhinitis., Disp: , Rfl:    metoprolol succinate (TOPROL XL) 25 MG 24 hr tablet, Take 0.5 tablets (12.5 mg total) by mouth daily. (Patient taking differently: Take 25 mg by mouth daily.), Disp: 30 tablet, Rfl: 0   Nutritional Supplements (JUICE PLUS FIBRE PO), Take by mouth. 2 tabs twice daily, Disp: , Rfl:    omeprazole (PRILOSEC) 20 MG capsule, Take 20 mg by mouth daily., Disp: , Rfl:    pravastatin (PRAVACHOL) 20 MG tablet, Take 1 tablet (20 mg total) by mouth daily., Disp: 90 tablet, Rfl: 1   sacubitril-valsartan (ENTRESTO) 97-103 MG, Take 1 tablet by mouth 2 (two) times daily., Disp: 180 tablet, Rfl: 3   sertraline (ZOLOFT) 50 MG tablet, Take 1 tablet (50 mg total) by mouth daily., Disp: 90 tablet, Rfl: 3   sildenafil (VIAGRA) 100 MG tablet, Take 0.5 tablets (50 mg total) by mouth as needed for erectile dysfunction., Disp: 30 tablet, Rfl: 6   valACYclovir (VALTREX) 500 MG tablet, Take 1 tablet (500 mg total) by mouth 2 (two) times daily. 1 tablet twice daily for 1 day for each  episode of herpes simplex (Patient  taking differently: Take 500 mg by mouth as needed. 1 tablet twice daily for 1 day for each  episode of herpes simplex), Disp: 20 tablet, Rfl: 2   aspirin EC 81 MG tablet, Take 1  tablet (81 mg total) by mouth daily. Swallow whole. (Patient not taking: Reported on 10/10/2021), Disp: 90 tablet, Rfl: 3   benzonatate (TESSALON) 200 MG capsule, Take 1 capsule (200 mg total) by mouth 2 (two) times daily as needed. (Patient not taking: Reported on 10/10/2021), Disp: 20 capsule, Rfl: 0   EPIPEN 2-PAK 0.3 MG/0.3ML DEVI, Inject 0.3 mg into the muscle as needed.  (Patient not taking: Reported on 10/10/2021), Disp: , Rfl:    loratadine (CLARITIN REDITABS) 10 MG dissolvable tablet, Take 1 tablet by mouth as needed. (Patient not taking: Reported on 10/10/2021), Disp: , Rfl:    oseltamivir (TAMIFLU) 75 MG capsule, Take 1 capsule (75 mg total) by mouth 2 (two) times daily. (Patient not taking: Reported on 10/10/2021), Disp: 10 capsule, Rfl: 0    Physical Exam: Blood pressure 128/86, pulse 86, height 5' 10" (1.778 m), weight 173 lb 9.6 oz (78.7 kg), SpO2 98 %.   Affect appropriate Healthy:  appears stated age 39: normal Neck supple with no adenopathy JVP normal no bruits no thyromegaly Lungs clear with no wheezing and good diaphragmatic motion Heart:  S1/S2 no murmur, no rub, gallop or click PMI normal Abdomen: benighn, BS positve, no tenderness, no AAA no bruit.  No HSM or HJR Distal pulses intact with no bruits No edema Neuro non-focal Bilateral knee arthritis     Labs:   Lab Results  Component Value Date   WBC 5.0 05/07/2021   HGB 13.6 05/07/2021   HCT 41.1 05/07/2021   MCV 96 05/07/2021   PLT 321 05/07/2021   No results for input(s): NA, K, CL, CO2, BUN, CREATININE, CALCIUM, PROT, BILITOT, ALKPHOS, ALT, AST, GLUCOSE in the last 168 hours.  Invalid input(s): LABALBU Lab Results  Component Value Date   CKTOTAL 210 03/18/2012   CKMB 3.9 03/18/2012   TROPONINI <0.30 03/18/2012    Lab Results   Component Value Date   CHOL 140 11/27/2020   CHOL 204 (H) 11/22/2019   CHOL 176 10/27/2018   Lab Results  Component Value Date   HDL 44.40 11/27/2020   HDL 48.40 11/22/2019   HDL 61.00 10/27/2018   Lab Results  Component Value Date   LDLCALC 76 11/27/2020   LDLCALC 130 (H) 11/22/2019   LDLCALC 98 10/27/2018   Lab Results  Component Value Date   TRIG 96.0 11/27/2020   TRIG 128.0 11/22/2019   TRIG 84.0 10/27/2018   Lab Results  Component Value Date   CHOLHDL 3 11/27/2020   CHOLHDL 4 11/22/2019   CHOLHDL 3 10/27/2018   Lab Results  Component Value Date   LDLDIRECT 146.2 07/29/2012   LDLDIRECT 136.9 05/12/2011   LDLDIRECT 134.5 01/29/2010      Radiology: MR CARDIAC MORPHOLOGY W WO CONTRAST  Result Date: 10/07/2021 CLINICAL DATA:  Nonischemic cardiomyopathy EXAM: CARDIAC MRI TECHNIQUE: The patient was scanned on a 1.5 Tesla GE magnet. A dedicated cardiac coil was used. Functional imaging was done using Fiesta sequences. 2,3, and 4 chamber views were done to assess for RWMA's. Modified Simpson's rule using a short axis stack was used to calculate an ejection fraction on a dedicated work Conservation officer, nature. The patient received 8 cc of Gadavist. After 10 minutes inversion recovery sequences were used to assess for infiltration and scar tissue. CONTRAST:  Gadavist 8 cc FINDINGS: Limited images of the lung fields showed no gross abnormalities. Normal left ventricular size with normal wall thickness. Diffuse moderate hypokinesis, EF 36%. Normal right ventricular size with moderate hypokinesis, EF  35%. Normal left and right atrial sizes. No significant mitral regurgitation noted. Trileaflet aortic valve with no stenosis or regurgitation. Normal first pass perfusion images. Delayed enhancement imaging was difficult (poor quality), but there did appear to be small areas of mid-wall late gadolinium enhancement (LGE) in the basal septum. MEASUREMENTS: MEASUREMENTS LVEDV 150 mL  LVSV 55 mL LVEF 36% RVEDV 119 mL RVSV 41 mL RVEF 35% T1 1090, ECV 36% basal septum. T2 45 basal septum IMPRESSION: 1. Normal LV size and wall thickness, diffuse hypokinesis with EF 36%. 2.  Normal RV size with moderate hypokinesis, EF 35%. 3. Non-coronary somewhat subtle LGE pattern in the basal septum. Mid-wall LGE most suggestive of prior myocarditis. Elevated extracellular volume percentage in the basal septum suggestive of fibrosis. Basal septal T2 not elevated, doubt active inflammation. Dalton Mclean Electronically Signed   By: Loralie Champagne M.D.   On: 10/07/2021 17:00     EKG: 2014 SR LAFB LVH  10/10/2021 NSR LAFB LVH no changes 10/10/2021 SR rate 86 RBBB/LAFB    ASSESSMENT AND PLAN:   1. Family History CAD:  average calcium score no obstructive dx on CT 12/03/20 2. HLD:  On statin LDL 76 11/27/20  3. AML/BMT:  Stable post transplant x 2 4. Chronic Systolic CHF: ? Related to Rx for cancer On optimal medical Rx now EF 36% by MRI 10/07/21  He has no volume overload what so ever and do not feel aldactone needed  5. Ortho:  bilateral knee issues f/u [facility] post arthroscopic surgery on right and likely will have left done after golf season    F/U in  6 months   Time:  spent reviewing chart , echo cardiac CT  MRI direct patient interview and composing note 35 minutes   Signed: Jenkins Rouge 10/10/2021, 9:19 AM

## 2021-10-07 ENCOUNTER — Ambulatory Visit (HOSPITAL_COMMUNITY)
Admission: RE | Admit: 2021-10-07 | Discharge: 2021-10-07 | Disposition: A | Discharge status: Home / Self Care | Payer: Medicare Other | Source: Ambulatory Visit | Attending: Cardiovascular Disease | Admitting: Cardiovascular Disease

## 2021-10-07 ENCOUNTER — Other Ambulatory Visit: Payer: Self-pay

## 2021-10-07 DIAGNOSIS — I5022 Chronic systolic (congestive) heart failure: Secondary | ICD-10-CM | POA: Diagnosis present

## 2021-10-07 DIAGNOSIS — I429 Cardiomyopathy, unspecified: Secondary | ICD-10-CM | POA: Diagnosis present

## 2021-10-07 MED ORDER — GADOBUTROL 1 MMOL/ML IV SOLN
10.0000 mL | Freq: Once | INTRAVENOUS | Status: AC | PRN
  Administered 2021-10-07: 10 mL via INTRAVENOUS

## 2021-10-10 ENCOUNTER — Other Ambulatory Visit: Payer: Self-pay

## 2021-10-10 ENCOUNTER — Ambulatory Visit (INDEPENDENT_AMBULATORY_CARE_PROVIDER_SITE_OTHER): Payer: Medicare Other | Admitting: Cardiovascular Disease

## 2021-10-10 ENCOUNTER — Telehealth: Payer: Self-pay | Admitting: Cardiovascular Disease

## 2021-10-10 ENCOUNTER — Encounter: Payer: Self-pay | Admitting: Cardiovascular Disease

## 2021-10-10 VITALS — BP 128/86 | HR 86 | Ht 70.0 in | Wt 173.6 lb

## 2021-10-10 DIAGNOSIS — I10 Essential (primary) hypertension: Secondary | ICD-10-CM

## 2021-10-10 DIAGNOSIS — Z8249 Family history of ischemic heart disease and other diseases of the circulatory system: Secondary | ICD-10-CM

## 2021-10-10 DIAGNOSIS — I429 Cardiomyopathy, unspecified: Secondary | ICD-10-CM

## 2021-10-10 DIAGNOSIS — E785 Hyperlipidemia, unspecified: Secondary | ICD-10-CM | POA: Diagnosis not present

## 2021-10-10 MED ORDER — ENTRESTO 97-103 MG PO TABS: 1.0000 | ORAL_TABLET | Freq: Two times a day (BID) | ORAL | 3 refills | Status: DC

## 2021-10-10 MED ORDER — PRAVASTATIN SODIUM 20 MG PO TABS
20.0000 mg | ORAL_TABLET | Freq: Every day | ORAL | 3 refills | Status: DC
Start: 2021-10-10 — End: 2022-12-08

## 2021-10-10 MED ORDER — METOPROLOL SUCCINATE ER 25 MG PO TB24: 25.0000 mg | ORAL_TABLET | Freq: Every day | ORAL | 3 refills | Status: DC

## 2021-10-10 NOTE — Patient Instructions (Signed)

## 2021-10-29 HISTORY — PX: EAR EXAMINATION UNDER ANESTHESIA: SHX1482

## 2021-11-28 ENCOUNTER — Telehealth: Payer: Self-pay | Admitting: Family Medicine

## 2021-11-28 NOTE — Telephone Encounter (Signed)
Copied from [place] [id number]. Topic: Medicare AWV ?>> Nov 28, 2021 10:25 AM Harris-Danzy, Hannah Beat wrote: ?Reason for CRM: Left message for patient to schedule Annual Wellness Visit.  Please schedule with Nurse Health Advisor Charlott Rakes, RN at [facility].  Please call [phone] ask for Juliann Pulse ?

## 2021-12-10 ENCOUNTER — Other Ambulatory Visit: Payer: Self-pay | Admitting: Family Medicine

## 2021-12-16 ENCOUNTER — Ambulatory Visit (INDEPENDENT_AMBULATORY_CARE_PROVIDER_SITE_OTHER): Payer: Medicare Other | Admitting: Family Medicine

## 2021-12-16 VITALS — BP 122/76 | HR 70 | Temp 98.1°F | Ht 70.0 in | Wt 171.8 lb

## 2021-12-16 DIAGNOSIS — I10 Essential (primary) hypertension: Secondary | ICD-10-CM

## 2021-12-16 DIAGNOSIS — J302 Other seasonal allergic rhinitis: Secondary | ICD-10-CM | POA: Diagnosis not present

## 2021-12-16 MED ORDER — POLYMYXIN B-TRIMETHOPRIM 10000-0.1 UNIT/ML-% OP SOLN: 2.0000 [drp] | OPHTHALMIC | 0 refills | Status: DC

## 2021-12-16 NOTE — Assessment & Plan Note (Signed)
Follows with allergy.  Likely contributing to his above conjunctivitis.  He will continue taking his allergy meds including Zyrtec, Flonase, Astelin and Pataday drops as prescribed. ?

## 2021-12-16 NOTE — Progress Notes (Signed)
? ?  [name] is a 66 y.o. male who presents today for an office visit. ? ?Assessment/Plan:  ?New/Acute Problems: ?Conjunctivitis ?Given unilateral nature we will start Polytrim drops.  He has done this in the past and has done well with it.  No red flags today on exam or in history.  He will let me know if not improving over the next several days. ? ?Chronic Problems Addressed Today: ?Seasonal allergies ?Follows with allergy.  Likely contributing to his above conjunctivitis.  He will continue taking his allergy meds including Zyrtec, Flonase, Astelin and Pataday drops as prescribed. ? ?Essential hypertension, benign ?At goal on Entresto 97-103 twice daily and metoprolol succinate 25 mg daily. ? ? ?  ?Subjective:  ?HPI: ? ?Patient here with eye irritation.  Predominately noted in the left eye.  Woke up with matted eyes and discharge.  Been going on for a few days but got worse this morning.  No vision changes.  Does have a history of allergies and has been following with allergy for this.  Has been using Pataday drops for this. ? ?\   ?  ?Objective:  ?Physical Exam: ?BP 122/76 (BP Location: Left Arm)   Pulse 70   Temp 98.1 ?F (36.7 ?C) (Temporal)   Ht '5\' 10"'$  (1.778 m)   Wt 171 lb 12.8 oz (77.9 kg)   SpO2 98%   BMI 24.65 kg/m?   ?Gen: No acute distress, resting comfortably ?HEENT: Extraocular abdomen is intact without pain.  Left sclera and conjunctive a erythematous.  Watery discharge in the left eye. ?CV: Regular rate and rhythm with no murmurs appreciated ?Pulm: Normal work of breathing, clear to auscultation bilaterally with no crackles, wheezes, or rhonchi ?Neuro: Grossly normal, moves all extremities ?Psych: Normal affect and thought content ? ?   ? ?Algis Greenhouse. [name], MD ?12/16/2021 2:05 PM  ?

## 2021-12-16 NOTE — Patient Instructions (Signed)
It was very nice to see you today! ? ?Please start the Polytrim drops.  Please continue other medications as you have been doing. ? ?Let us know if not improving. ? ?Take care, ?Dr [name] ? ?PLEASE NOTE: ? ?If you had any lab tests please let us know if you have not heard back within a few days. You may see your results on mychart before we have a chance to review them but we will give you a call once they are reviewed by [place]. If we ordered any referrals today, please let us know if you have not heard from their office within the next week.  ? ?Please try these tips to maintain a healthy lifestyle: ? ?Eat at least 3 REAL meals and 1-2 snacks per day.  Aim for no more than 5 hours between eating.  If you eat breakfast, please do so within one hour of getting up.  ? ?Each meal should contain half fruits/vegetables, one quarter protein, and one quarter carbs (no bigger than a computer mouse) ? ?Cut down on sweet beverages. This includes juice, soda, and sweet tea.  ? ?Drink at least 1 glass of water with each meal and aim for at least 8 glasses per day ? ?Exercise at least 150 minutes every week.   ?

## 2021-12-16 NOTE — Assessment & Plan Note (Signed)
At goal on Entresto 97-103 twice daily and metoprolol succinate 25 mg daily. ?

## 2021-12-23 ENCOUNTER — Encounter: Payer: Self-pay | Admitting: Family Medicine

## 2021-12-23 ENCOUNTER — Ambulatory Visit (INDEPENDENT_AMBULATORY_CARE_PROVIDER_SITE_OTHER): Payer: Medicare Other

## 2021-12-23 DIAGNOSIS — Z0000 Encounter for general adult medical examination without abnormal findings: Secondary | ICD-10-CM

## 2021-12-23 NOTE — Patient Instructions (Addendum)
[name] , ?Thank you for taking time to come for your Medicare Wellness Visit. I appreciate your ongoing commitment to your health goals. Please review the following plan we discussed and let me know if I can assist you in the future.  ? ?Screening recommendations/referrals: ?Colonoscopy: done 10/01/21 repeat 10 years  ?Recommended yearly ophthalmology/optometry visit for glaucoma screening and checkup ?Recommended yearly dental visit for hygiene and checkup ? ?Vaccinations: ?Influenza vaccine: done 09/13/21 repeat every year  ?Pneumococcal vaccine: Up to date ?Tdap vaccine: pt stated up to date via [facility]  ?Shingles vaccine: completed 10/27/18 & 08/17/20    ?Covid-19: 3/11, 4/1, 6/14, 03/27/20 & 08/29/20, 06/18/21  ? ?Advanced directives: Please bring a copy of your health care power of attorney and living will to the office at your convenience. ? ?Conditions/risks identified: maintain sugar and stay active as can  ? ?Next appointment: Follow up in one year for your annual wellness visit.  ? ?Preventive Care 67 Years and Older, Male ?Preventive care refers to lifestyle choices and visits with your health care provider that can promote health and wellness. ?What does preventive care include? ?A yearly physical exam. This is also called an annual well check. ?Dental exams once or twice a year. ?Routine eye exams. Ask your health care provider how often you should have your eyes checked. ?Personal lifestyle choices, including: ?Daily care of your teeth and gums. ?Regular physical activity. ?Eating a healthy diet. ?Avoiding tobacco and drug use. ?Limiting alcohol use. ?Practicing safe sex. ?Taking low doses of aspirin every day. ?Taking vitamin and mineral supplements as recommended by your health care provider. ?What happens during an annual well check? ?The services and screenings done by your health care provider during your annual well check will depend on your age, overall health, lifestyle risk factors, and family  history of disease. ?Counseling  ?Your health care provider may ask you questions about your: ?Alcohol use. ?Tobacco use. ?Drug use. ?Emotional well-being. ?Home and relationship well-being. ?Sexual activity. ?Eating habits. ?History of falls. ?Memory and ability to understand (cognition). ?Work and work Statistician. ?Screening  ?You may have the following tests or measurements: ?Height, weight, and BMI. ?Blood pressure. ?Lipid and cholesterol levels. These may be checked every 5 years, or more frequently if you are over 82 years old. ?Skin check. ?Lung cancer screening. You may have this screening every year starting at age 67 if you have a 30-pack-year history of smoking and currently smoke or have quit within the past 15 years. ?Fecal occult blood test (FOBT) of the stool. You may have this test every year starting at age 67. ?Flexible sigmoidoscopy or colonoscopy. You may have a sigmoidoscopy every 5 years or a colonoscopy every 10 years starting at age 67. ?Prostate cancer screening. Recommendations will vary depending on your family history and other risks. ?Hepatitis C blood test. ?Hepatitis B blood test. ?Sexually transmitted disease (STD) testing. ?Diabetes screening. This is done by checking your blood sugar (glucose) after you have not eaten for a while (fasting). You may have this done every 1-3 years. ?Abdominal aortic aneurysm (AAA) screening. You may need this if you are a current or former smoker. ?Osteoporosis. You may be screened starting at age 67 if you are at high risk. ?Talk with your health care provider about your test results, treatment options, and if necessary, the need for more tests. ?Vaccines  ?Your health care provider may recommend certain vaccines, such as: ?Influenza vaccine. This is recommended every year. ?Tetanus, diphtheria, and acellular  pertussis (Tdap, Td) vaccine. You may need a Td booster every 10 years. ?Zoster vaccine. You may need this after age 67. ?Pneumococcal  13-valent conjugate (PCV13) vaccine. One dose is recommended after age 67. ?Pneumococcal polysaccharide (PPSV23) vaccine. One dose is recommended after age 67. ?Talk to your health care provider about which screenings and vaccines you need and how often you need them. ?This information is not intended to replace advice given to you by your health care provider. Make sure you discuss any questions you have with your health care provider. ?Document Released: 08/24/2015 Document Revised: 04/16/2016 Document Reviewed: 05/29/2015 ?Elsevier Interactive Patient Education ? 2017 [place]. ? ?Fall Prevention in the Home ?Falls can cause injuries. They can happen to people of all ages. There are many things you can do to make your home safe and to help prevent falls. ?What can I do on the outside of my home? ?Regularly fix the edges of walkways and driveways and fix any cracks. ?Remove anything that might make you trip as you walk through a door, such as a raised step or threshold. ?Trim any bushes or trees on the path to your home. ?Use bright outdoor lighting. ?Clear any walking paths of anything that might make someone trip, such as rocks or tools. ?Regularly check to see if handrails are loose or broken. Make sure that both sides of any steps have handrails. ?Any raised decks and porches should have guardrails on the edges. ?Have any leaves, snow, or ice cleared regularly. ?Use sand or salt on walking paths during winter. ?Clean up any spills in your garage right away. This includes oil or grease spills. ?What can I do in the bathroom? ?Use night lights. ?Install grab bars by the toilet and in the tub and shower. Do not use towel bars as grab bars. ?Use non-skid mats or decals in the tub or shower. ?If you need to sit down in the shower, use a plastic, non-slip stool. ?Keep the floor dry. Clean up any water that spills on the floor as soon as it happens. ?Remove soap buildup in the tub or shower regularly. ?Attach  bath mats securely with double-sided non-slip rug tape. ?Do not have throw rugs and other things on the floor that can make you trip. ?What can I do in the bedroom? ?Use night lights. ?Make sure that you have a light by your bed that is easy to reach. ?Do not use any sheets or blankets that are too big for your bed. They should not hang down onto the floor. ?Have a firm chair that has side arms. You can use this for support while you get dressed. ?Do not have throw rugs and other things on the floor that can make you trip. ?What can I do in the kitchen? ?Clean up any spills right away. ?Avoid walking on wet floors. ?Keep items that you use a lot in easy-to-reach places. ?If you need to reach something above you, use a strong step stool that has a grab bar. ?Keep electrical cords out of the way. ?Do not use floor polish or wax that makes floors slippery. If you must use wax, use non-skid floor wax. ?Do not have throw rugs and other things on the floor that can make you trip. ?What can I do with my stairs? ?Do not leave any items on the stairs. ?Make sure that there are handrails on both sides of the stairs and use them. Fix handrails that are broken or loose. Make sure that handrails  are as long as the stairways. ?Check any carpeting to make sure that it is firmly attached to the stairs. Fix any carpet that is loose or worn. ?Avoid having throw rugs at the top or bottom of the stairs. If you do have throw rugs, attach them to the floor with carpet tape. ?Make sure that you have a light switch at the top of the stairs and the bottom of the stairs. If you do not have them, ask someone to add them for you. ?What else can I do to help prevent falls? ?Wear shoes that: ?Do not have high heels. ?Have rubber bottoms. ?Are comfortable and fit you well. ?Are closed at the toe. Do not wear sandals. ?If you use a stepladder: ?Make sure that it is fully opened. Do not climb a closed stepladder. ?Make sure that both sides of the  stepladder are locked into place. ?Ask someone to hold it for you, if possible. ?Clearly mark and make sure that you can see: ?Any grab bars or handrails. ?First and last steps. ?Where the edge of each step is.

## 2021-12-23 NOTE — Progress Notes (Addendum)
Virtual Visit via Telephone Note ? ?I connected with  [name] on 12/23/21 at  2:15 PM EDT by telephone and verified that I am speaking with the correct person using two identifiers. ? ?Medicare Annual Wellness visit completed telephonically due to Covid-19 pandemic.  ? ?Persons participating in this call: This Health Coach and this patient.  ? ?Location: ?Patient: home ?Provider: office ?  ?I discussed the limitations, risks, security and privacy concerns of performing an evaluation and management service by telephone and the availability of in person appointments. The patient expressed understanding and agreed to proceed. ? ?Unable to perform video visit due to video visit attempted and failed and/or patient does not have video capability.  ? ?Some vital signs may be absent or patient reported.  ? ?Willette Brace, LPN ? ? ?Subjective:  ? [name] is a 66 y.o. male who presents for an Initial Medicare Annual Wellness Visit. ? ?Review of Systems    ? ?Cardiac Risk Factors include: advanced age (>56mn, >>55women);dyslipidemia;male gender;hypertension ? ?   ?Objective:  ?  ?There were no vitals filed for this visit. ?There is no height or weight on file to calculate BMI. ? ? ?  12/23/2021  ?  2:30 PM 03/17/2012  ?  3:34 PM  ?Advanced Directives  ?Does Patient Have a Medical Advance Directive? Yes Patient has advance directive, copy not in chart  ?Type of Advance Directive Healthcare Power of Attorney Living will;Healthcare Power of Attorney  ?Copy of [facility] Chart? No - copy requested Copy requested from family  ?Pre-existing out of facility DNR order (yellow form or pink MOST form)  No  ? ? ?Current Medications (verified) ?Outpatient Encounter Medications as of 12/23/2021  ?Medication Sig  ? Ascorbic Acid (VITAMIN C) 500 MG CAPS Take 500 mg by mouth daily.  ? azelastine (ASTELIN) 0.1 % nasal spray Place into both nostrils 2 (two) times daily. Use in each nostril as directed  ? B  Complex Vitamins (VITAMIN B COMPLEX) TABS Take by mouth.  ? calcium-vitamin D (OSCAL WITH D) 500-200 MG-UNIT TABS tablet Take by mouth.  ? cetirizine (ZYRTEC) 10 MG tablet Take 10 mg by mouth daily.  ? Cyanocobalamin (VITAMIN B12) 1000 MCG TBCR 1 tablet  ? empagliflozin (JARDIANCE) 10 MG TABS tablet Take 1 tablet (10 mg total) by mouth daily.  ? EPIPEN 2-PAK 0.3 MG/0.3ML DEVI Inject 0.3 mg into the muscle as needed.  ? fluticasone (FLONASE) 50 MCG/ACT nasal spray Place into both nostrils daily.  ? ipratropium (ATROVENT) 0.03 % nasal spray Place 2 sprays into both nostrils as needed for rhinitis.  ? metoprolol succinate (TOPROL XL) 25 MG 24 hr tablet Take 1 tablet (25 mg total) by mouth daily.  ? montelukast (SINGULAIR) 10 MG tablet Take 10 mg by mouth daily.  ? Nutritional Supplements (JUICE PLUS FIBRE PO) Take by mouth. 2 tabs twice daily  ? Olopatadine HCl (PATADAY OP) Apply to eye.  ? omeprazole (PRILOSEC) 20 MG capsule Take 20 mg by mouth daily.  ? pravastatin (PRAVACHOL) 20 MG tablet Take 1 tablet (20 mg total) by mouth daily.  ? sacubitril-valsartan (ENTRESTO) 97-103 MG Take 1 tablet by mouth 2 (two) times daily.  ? sertraline (ZOLOFT) 50 MG tablet TAKE 1 TABLET BY MOUTH EVERY DAY  ? sildenafil (VIAGRA) 100 MG tablet Take 0.5 tablets (50 mg total) by mouth as needed for erectile dysfunction.  ? trimethoprim-polymyxin b (POLYTRIM) ophthalmic solution Place 2 drops into the left  eye every 4 (four) hours.  ? valACYclovir (VALTREX) 500 MG tablet Take 1 tablet (500 mg total) by mouth 2 (two) times daily. 1 tablet twice daily for 1 day for each  episode of herpes simplex (Patient taking differently: Take 500 mg by mouth as needed. 1 tablet twice daily for 1 day for each  episode of herpes simplex)  ? benzonatate (TESSALON) 200 MG capsule Take 1 capsule (200 mg total) by mouth 2 (two) times daily as needed. (Patient not taking: Reported on 12/16/2021)  ? [DISCONTINUED] aspirin EC 81 MG tablet Take 1 tablet (81 mg  total) by mouth daily. Swallow whole.  ? [DISCONTINUED] loratadine (CLARITIN REDITABS) 10 MG dissolvable tablet Take 1 tablet by mouth as needed.  ? [DISCONTINUED] oseltamivir (TAMIFLU) 75 MG capsule Take 1 capsule (75 mg total) by mouth 2 (two) times daily.  ? ?No facility-administered encounter medications on file as of 12/23/2021.  ? ? ?Allergies (verified) ?Morphine and Marinol [dronabinol]  ? ?History: ?Past Medical History:  ?Diagnosis Date  ? Acute myeloid leukemia in remission ([place]) 1999  ? Anemia   ? Arthritis   ? Depression   ? Diverticulosis of colon (without mention of hemorrhage)   ? Diverticulosis of colon (without mention of hemorrhage) 04/21/2011  ? Esophageal reflux   ? Family hx colonic polyps   ? Family hx colonic polyps 04/21/2011  ? Hiatal hernia   ? Hiatal hernia 04/21/2011  ? History of kidney stones   ? Hyperlipemia   ? Hypertension   ? MDS (myelodysplastic syndrome) ([place])   ? Psychosexual dysfunction with inhibited sexual excitement   ? ?Past Surgical History:  ?Procedure Laterality Date  ? EAR EXAMINATION UNDER ANESTHESIA Right 10/29/2021  ? middle ear cleaned out infection, with an inplanted ear tube  ? KNEE SURGERY Left 08/14/2021  ? [facility]  ? SHOULDER SURGERY    ? x 3   ? ?Family History  ?Problem Relation Age of Onset  ? Hypertension Father   ? Heart disease Father   ? Prostate cancer Father   ?     dx late 19s  ? Cancer Father 8  ?     prostate ca  ? Celiac disease Mother   ? Colon polyps Mother   ? Breast cancer Mother   ? Cancer Mother   ?     Bladder  ? Colon cancer Neg Hx   ? Esophageal cancer Neg Hx   ? Inflammatory bowel disease Neg Hx   ? Liver disease Neg Hx   ? Pancreatic cancer Neg Hx   ? Rectal cancer Neg Hx   ? Stomach cancer Neg Hx   ? ?Social History  ? ?Socioeconomic History  ? Marital status: Married  ?  Spouse name: Not on file  ? Number of children: 2  ? Years of education: Not on file  ? Highest education level: Not on file  ?Occupational History  ? Occupation:  self employed  ?  Employer: [employer]  ?Tobacco Use  ? Smoking status: Never  ? Smokeless tobacco: Never  ?Vaping Use  ? Vaping Use: Never used  ?Substance and Sexual Activity  ? Alcohol use: Yes  ?  Comment: one occ   ? Drug use: No  ? Sexual activity: Never  ?Other Topics Concern  ? Not on file  ?Social History Narrative  ? 2 caffeine drinks daily   ? ?Social Determinants of Health  ? ?Financial Resource Strain: Low Risk   ? Difficulty of  Paying Living Expenses: Not hard at all  ?Food Insecurity: No Food Insecurity  ? Worried About Charity fundraiser in the Last Year: Never true  ? Ran Out of Food in the Last Year: Never true  ?Transportation Needs: No Transportation Needs  ? Lack of Transportation (Medical): No  ? Lack of Transportation (Non-Medical): No  ?Physical Activity: Sufficiently Active  ? Days of Exercise per Week: 5 days  ? Minutes of Exercise per Session: 40 min  ?Stress: No Stress Concern Present  ? Feeling of Stress : Not at all  ?Social Connections: Socially Integrated  ? Frequency of Communication with Friends and Family: More than three times a week  ? Frequency of Social Gatherings with Friends and Family: More than three times a week  ? Attends Religious Services: More than 4 times per year  ? Active Member of Clubs or Organizations: Yes  ? Attends Archivist Meetings: 1 to 4 times per year  ? Marital Status: Married  ? ? ?Tobacco Counseling ?Counseling given: Not Answered ? ? ?Clinical Intake: ? ?Pre-visit preparation completed: Yes ? ?Pain : No/denies pain ? ?  ? ?BMI - recorded: 24.65 ?Nutritional Status: BMI of 19-24  Normal ?Nutritional Risks: None ?Diabetes: No ? ?How often do you need to have someone help you when you read instructions, pamphlets, or other written materials from your doctor or pharmacy?: 1 - Never ? ?Diabetic?no ? ?Interpreter Needed?: No ? ?Information entered by :: Charlott Rakes, LPN ? ? ?Activities of Daily Living ? ?  12/23/2021  ?  2:31 PM  ?In your  present state of health, do you have any difficulty performing the following activities:  ?Hearing? 1  ?Comment right ear loss improving  ?Vision? 0  ?Difficulty concentrating or making decisions? 0  ?Walking o

## 2021-12-26 ENCOUNTER — Encounter: Payer: Self-pay | Admitting: Family Medicine

## 2022-01-02 ENCOUNTER — Encounter: Payer: Self-pay | Admitting: Family Medicine

## 2022-01-02 ENCOUNTER — Ambulatory Visit (INDEPENDENT_AMBULATORY_CARE_PROVIDER_SITE_OTHER): Payer: Medicare Other | Admitting: Family Medicine

## 2022-01-02 VITALS — BP 126/84 | HR 72 | Temp 97.4°F | Ht 70.0 in | Wt 169.0 lb

## 2022-01-02 DIAGNOSIS — R739 Hyperglycemia, unspecified: Secondary | ICD-10-CM

## 2022-01-02 DIAGNOSIS — N529 Male erectile dysfunction, unspecified: Secondary | ICD-10-CM

## 2022-01-02 DIAGNOSIS — N400 Enlarged prostate without lower urinary tract symptoms: Secondary | ICD-10-CM | POA: Diagnosis not present

## 2022-01-02 DIAGNOSIS — I5022 Chronic systolic (congestive) heart failure: Secondary | ICD-10-CM

## 2022-01-02 DIAGNOSIS — F419 Anxiety disorder, unspecified: Secondary | ICD-10-CM | POA: Diagnosis not present

## 2022-01-02 DIAGNOSIS — E785 Hyperlipidemia, unspecified: Secondary | ICD-10-CM | POA: Diagnosis not present

## 2022-01-02 DIAGNOSIS — Z23 Encounter for immunization: Secondary | ICD-10-CM | POA: Diagnosis not present

## 2022-01-02 DIAGNOSIS — I10 Essential (primary) hypertension: Secondary | ICD-10-CM | POA: Diagnosis not present

## 2022-01-02 LAB — COMPREHENSIVE METABOLIC PANEL
ALT: 19 U/L (ref 0–53)
AST: 21 U/L (ref 0–37)
Albumin: 4.4 g/dL (ref 3.5–5.2)
Alkaline Phosphatase: 56 U/L (ref 39–117)
BUN: 28 mg/dL — ABNORMAL HIGH (ref 6–23)
CO2: 29 mEq/L (ref 19–32)
Calcium: 9.8 mg/dL (ref 8.4–10.5)
Chloride: 105 mEq/L (ref 96–112)
Creatinine, Ser: 1.1 mg/dL (ref 0.40–1.50)
GFR: 69.99 mL/min (ref >60.00)
Glucose, Bld: 88 mg/dL (ref 70–99)
Potassium: 4.6 mEq/L (ref 3.5–5.1)
Sodium: 141 mEq/L (ref 135–145)
Total Bilirubin: 0.4 mg/dL (ref 0.2–1.2)
Total Protein: 6.7 g/dL (ref 6.0–8.3)

## 2022-01-02 LAB — LIPID PANEL
Cholesterol: 169 mg/dL (ref 0–200)
HDL: 52.4 mg/dL (ref >39.00)
LDL Cholesterol: 101 mg/dL — ABNORMAL HIGH (ref 0–99)
NonHDL: 116.56
Total CHOL/HDL Ratio: 3
Triglycerides: 79 mg/dL (ref 0.0–149.0)
VLDL: 15.8 mg/dL (ref 0.0–40.0)

## 2022-01-02 LAB — CBC
HCT: 40.2 % (ref 39.0–52.0)
Hemoglobin: 13.1 g/dL (ref 13.0–17.0)
MCHC: 32.7 g/dL (ref 30.0–36.0)
MCV: 95.9 fl (ref 78.0–100.0)
Platelets: 303 10*3/uL (ref 150.0–400.0)
RBC: 4.19 Mil/uL — ABNORMAL LOW (ref 4.22–5.81)
RDW: 13.6 % (ref 11.5–15.5)
WBC: 4.7 10*3/uL (ref 4.0–10.5)

## 2022-01-02 LAB — PSA: PSA: 1.8 ng/mL (ref 0.10–4.00)

## 2022-01-02 LAB — TSH: TSH: 2.69 u[IU]/mL (ref 0.35–5.50)

## 2022-01-02 LAB — HEMOGLOBIN A1C: Hgb A1c MFr Bld: 6.2 % (ref 4.6–6.5)

## 2022-01-02 MED ORDER — VALACYCLOVIR HCL 500 MG PO TABS: 500.0000 mg | ORAL_TABLET | Freq: Two times a day (BID) | ORAL | 2 refills | Status: DC

## 2022-01-02 MED ORDER — IPRATROPIUM BROMIDE 0.03 % NA SOLN: 2.0000 | NASAL | 5 refills | Status: DC | PRN

## 2022-01-02 MED ORDER — SERTRALINE HCL 50 MG PO TABS: 50.0000 mg | ORAL_TABLET | Freq: Every day | ORAL | 3 refills | Status: DC

## 2022-01-02 MED ORDER — AZELASTINE HCL 0.1 % NA SOLN: 2.0000 | Freq: Two times a day (BID) | NASAL | 5 refills | Status: DC

## 2022-01-02 MED ORDER — SILDENAFIL CITRATE 100 MG PO TABS
50.0000 mg | ORAL_TABLET | ORAL | 6 refills | Status: DC | PRN
Start: 2022-01-02 — End: 2022-11-25

## 2022-01-02 NOTE — Assessment & Plan Note (Signed)
We will refill Viagra.  Symptoms are stable.

## 2022-01-02 NOTE — Assessment & Plan Note (Signed)
On pravastatin 20 mg daily.  We will check lipids today.

## 2022-01-02 NOTE — Assessment & Plan Note (Signed)
Stable.  Check PSA. 

## 2022-01-02 NOTE — Assessment & Plan Note (Signed)
Check A1c. 

## 2022-01-02 NOTE — Assessment & Plan Note (Signed)
Doing well on Zoloft 50 mg daily.  Will refill today.

## 2022-01-02 NOTE — Addendum Note (Signed)
Addended by: Betti Cruz on: 01/02/2022 08:52 AM   Modules accepted: Orders

## 2022-01-02 NOTE — Patient Instructions (Signed)
It was very nice to see you today!  We will check blood work and refill your medications today.  We will give you your MMR vaccine.   Please come back in 1 year for you next physical.   Take care, Dr [name]  PLEASE NOTE:  If you had any lab tests please let us know if you have not heard back within a few days. You may see your results on mychart before we have a chance to review them but we will give you a call once they are reviewed by [place]. If we ordered any referrals today, please let us know if you have not heard from their office within the next week.   Please try these tips to maintain a healthy lifestyle:  Eat at least 3 REAL meals and 1-2 snacks per day.  Aim for no more than 5 hours between eating.  If you eat breakfast, please do so within one hour of getting up.   Each meal should contain half fruits/vegetables, one quarter protein, and one quarter carbs (no bigger than a computer mouse)  Cut down on sweet beverages. This includes juice, soda, and sweet tea.   Drink at least 1 glass of water with each meal and aim for at least 8 glasses per day  Exercise at least 150 minutes every week.    Preventive Care 7 Years and Older, Male Preventive care refers to lifestyle choices and visits with your health care provider that can promote health and wellness. Preventive care visits are also called wellness exams. What can I expect for my preventive care visit? Counseling During your preventive care visit, your health care provider may ask about your: Medical history, including: Past medical problems. Family medical history. History of falls. Current health, including: Emotional well-being. Home life and relationship well-being. Sexual activity. Memory and ability to understand (cognition). Lifestyle, including: Alcohol, nicotine or tobacco, and drug use. Access to firearms. Diet, exercise, and sleep habits. Work and work Statistician. Sunscreen use. Safety issues such  as seatbelt and bike helmet use. Physical exam Your health care provider will check your: Height and weight. These may be used to calculate your BMI (body mass index). BMI is a measurement that tells if you are at a healthy weight. Waist circumference. This measures the distance around your waistline. This measurement also tells if you are at a healthy weight and may help predict your risk of certain diseases, such as type 2 diabetes and high blood pressure. Heart rate and blood pressure. Body temperature. Skin for abnormal spots. What immunizations do I need?  Vaccines are usually given at various ages, according to a schedule. Your health care provider will recommend vaccines for you based on your age, medical history, and lifestyle or other factors, such as travel or where you work. What tests do I need? Screening Your health care provider may recommend screening tests for certain conditions. This may include: Lipid and cholesterol levels. Diabetes screening. This is done by checking your blood sugar (glucose) after you have not eaten for a while (fasting). Hepatitis C test. Hepatitis B test. HIV (human immunodeficiency virus) test. STI (sexually transmitted infection) testing, if you are at risk. Lung cancer screening. Colorectal cancer screening. Prostate cancer screening. Abdominal aortic aneurysm (AAA) screening. You may need this if you are a current or former smoker. Talk with your health care provider about your test results, treatment options, and if necessary, the need for more tests. Follow these instructions at home: Eating and drinking  Eat a diet that includes fresh fruits and vegetables, whole grains, lean protein, and low-fat dairy products. Limit your intake of foods with high amounts of sugar, saturated fats, and salt. Take vitamin and mineral supplements as recommended by your health care provider. Do not drink alcohol if your health care provider tells you not to  drink. If you drink alcohol: Limit how much you have to 0-2 drinks a day. Know how much alcohol is in your drink. In the [place] one drink equals one 12 oz bottle of beer (355 mL), one 5 oz glass of wine (148 mL), or one 1 oz glass of hard liquor (44 mL). Lifestyle Brush your teeth every morning and night with fluoride toothpaste. Floss one time each day. Exercise for at least 30 minutes 5 or more days each week. Do not use any products that contain nicotine or tobacco. These products include cigarettes, chewing tobacco, and vaping devices, such as e-cigarettes. If you need help quitting, ask your health care provider. Do not use drugs. If you are sexually active, practice safe sex. Use a condom or other form of protection to prevent STIs. Take aspirin only as told by your health care provider. Make sure that you understand how much to take and what form to take. Work with your health care provider to find out whether it is safe and beneficial for you to take aspirin daily. Ask your health care provider if you need to take a cholesterol-lowering medicine (statin). Find healthy ways to manage stress, such as: Meditation, yoga, or listening to music. Journaling. Talking to a trusted person. Spending time with friends and family. Safety Always wear your seat belt while driving or riding in a vehicle. Do not drive: If you have been drinking alcohol. Do not ride with someone who has been drinking. When you are tired or distracted. While texting. If you have been using any mind-altering substances or drugs. Wear a helmet and other protective equipment during sports activities. If you have firearms in your house, make sure you follow all gun safety procedures. Minimize exposure to UV radiation to reduce your risk of skin cancer. What's next? Visit your health care provider once a year for an annual wellness visit. Ask your health care provider how often you should have your eyes and teeth  checked. Stay up to date on all vaccines. This information is not intended to replace advice given to you by your health care provider. Make sure you discuss any questions you have with your health care provider. Document Revised: 01/23/2021 Document Reviewed: 01/23/2021 Elsevier Patient Education  [employer].

## 2022-01-02 NOTE — Assessment & Plan Note (Signed)
Continue management per cardiology.  Euvolemic today.

## 2022-01-02 NOTE — Progress Notes (Signed)
   [name] is a 66 y.o. male who presents today for an office visit.  Assessment/Plan:  Chronic Problems Addressed Today: Essential hypertension, benign At goal on Entresto 97-103 twice daily and metoprolol succinate 25 mg daily.  Erectile dysfunction We will refill Viagra.  Symptoms are stable.  Dyslipidemia On pravastatin 20 mg daily.  We will check lipids today.  Heart failure with mid-range ejection fraction [facility]) Continue management per cardiology.  Euvolemic today.  Hyperglycemia Check A1c.  BPH (benign prostatic hyperplasia) Stable.  Check PSA.  Anxiety Doing well on Zoloft 50 mg daily.  Will refill today.  Preventative Healthcare We will give MMR vaccine today -  he is S/p BMT and needs a new vaccine per oncology.      Subjective:  HPI:  See A/P for status of chronic conditions.  He is doing well on today.  He has no acute concerns.       Objective:  Physical Exam: BP 126/84   Pulse 72   Temp (!) 97.4 F (36.3 C) (Temporal)   Ht $R'5\' 10"'BG$  (1.778 m)   Wt 169 lb (76.7 kg)   SpO2 100%   BMI 24.25 kg/m   Gen: No acute distress, resting comfortably CV: Regular rate and rhythm with no murmurs appreciated Pulm: Normal work of breathing, clear to auscultation bilaterally with no crackles, wheezes, or rhonchi Neuro: Grossly normal, moves all extremities Psych: Normal affect and thought content      [name] M. [name], MD 01/02/2022 8:43 AM

## 2022-01-02 NOTE — Assessment & Plan Note (Signed)
At goal on Entresto 97-103 twice daily and metoprolol succinate 25 mg daily.

## 2022-01-07 NOTE — Progress Notes (Signed)
Please inform patient of the following:  Labs are all stable.  Do not need to make any changes this time.  Would like for him to keep up the good work and we can recheck in a year or so.

## 2022-01-18 ENCOUNTER — Other Ambulatory Visit: Payer: Self-pay | Admitting: Cardiovascular Disease

## 2022-01-20 MED ORDER — EMPAGLIFLOZIN 10 MG PO TABS
10.0000 mg | ORAL_TABLET | Freq: Every day | ORAL | 2 refills | Status: DC
Start: 2022-01-20 — End: 2022-10-27

## 2022-01-28 ENCOUNTER — Ambulatory Visit (INDEPENDENT_AMBULATORY_CARE_PROVIDER_SITE_OTHER): Payer: Medicare Other | Admitting: Physician Assistant

## 2022-01-28 ENCOUNTER — Encounter: Payer: Self-pay | Admitting: Physician Assistant

## 2022-01-28 VITALS — BP 100/68 | HR 79 | Temp 97.7°F | Ht 70.0 in | Wt 169.0 lb

## 2022-01-28 DIAGNOSIS — Z9622 Myringotomy tube(s) status: Secondary | ICD-10-CM

## 2022-01-28 DIAGNOSIS — R051 Acute cough: Secondary | ICD-10-CM | POA: Diagnosis not present

## 2022-01-28 MED ORDER — AMOXICILLIN-POT CLAVULANATE 875-125 MG PO TABS: 1.0000 | ORAL_TABLET | Freq: Two times a day (BID) | ORAL | 0 refills | Status: DC

## 2022-01-28 MED ORDER — BENZONATATE 200 MG PO CAPS: 200.0000 mg | ORAL_CAPSULE | Freq: Two times a day (BID) | ORAL | 0 refills | Status: DC | PRN

## 2022-01-28 NOTE — Progress Notes (Signed)
[name] is a 66 y.o. male here for a new problem of nasal congestion.    History of Present Illness:   Chief Complaint  Patient presents with   Sinus Problem    Pt c/o nasal congestion x 4 weeks, last week c/o headache and teeth hurt. Ear surgery right ear end of March, both ears are congested. He has tubes in both ears, left one is draining this morning.    HPI  Sinus Problem  Patient complains of nasal congestion that has been ongoing for 4 weeks. His associated symptoms include post nasal drainage and cough. He has been coughing up some mucus at night. He states he had right ear surgery for chronic ear infection. States he currently have ear tubes and his left ear has been draining recently. He was prescribed Ciprodex ear drops but he has not used this recently. States he has been experiencing some head pressure since last week. He recently took trip [place] and [place]. He is going to vacation for 10 days and would like to be treated for this issue. Denies fever, chills, n/v/d, SOB, wheezing. No known sick contacts. No specific treatment tried. Denies any ear pain.   Past Medical History:  Diagnosis Date   Acute myeloid leukemia in remission ([place]) 1999   Anemia    Arthritis    Depression    Diverticulosis of colon (without mention of hemorrhage)    Diverticulosis of colon (without mention of hemorrhage) 04/21/2011   Esophageal reflux    Family hx colonic polyps    Family hx colonic polyps 04/21/2011   Hiatal hernia    Hiatal hernia 04/21/2011   History of kidney stones    Hyperlipemia    Hypertension    MDS (myelodysplastic syndrome) ([place])    Psychosexual dysfunction with inhibited sexual excitement      Social History   Tobacco Use   Smoking status: Never   Smokeless tobacco: Never  Vaping Use   Vaping Use: Never used  Substance Use Topics   Alcohol use: Yes    Comment: one occ    Drug use: No    Past Surgical History:  Procedure Laterality Date   EAR  EXAMINATION UNDER ANESTHESIA Right 10/29/2021   middle ear cleaned out infection, with an inplanted ear tube   KNEE SURGERY Left 08/14/2021   [facility]   SHOULDER SURGERY     x 3     Family History  Problem Relation Age of Onset   Hypertension Father    Heart disease Father    Prostate cancer Father        dx late 18s   Cancer Father 11       prostate ca   Celiac disease Mother    Colon polyps Mother    Breast cancer Mother    Cancer Mother        Bladder   Colon cancer Neg Hx    Esophageal cancer Neg Hx    Inflammatory bowel disease Neg Hx    Liver disease Neg Hx    Pancreatic cancer Neg Hx    Rectal cancer Neg Hx    Stomach cancer Neg Hx     Allergies  Allergen Reactions   Morphine Other (See Comments)    UNKNOWN - REACTION IS NOT LISTED   Marinol [Dronabinol]     Delusions.    Current Medications:   Current Outpatient Medications:    amoxicillin-clavulanate (AUGMENTIN) 875-125 MG tablet, Take 1 tablet by mouth 2 (  two) times daily., Disp: 14 tablet, Rfl: 0   Ascorbic Acid (VITAMIN C) 500 MG CAPS, Take 500 mg by mouth daily., Disp: , Rfl:    B Complex Vitamins (VITAMIN B COMPLEX) TABS, Take by mouth., Disp: , Rfl:    benzonatate (TESSALON) 200 MG capsule, Take 1 capsule (200 mg total) by mouth 2 (two) times daily as needed for cough., Disp: 30 capsule, Rfl: 0   calcium-vitamin D (OSCAL WITH D) 500-200 MG-UNIT TABS tablet, Take by mouth., Disp: , Rfl:    cetirizine (ZYRTEC) 10 MG tablet, Take 10 mg by mouth daily., Disp: , Rfl:    Cyanocobalamin (VITAMIN B12) 1000 MCG TBCR, 1 tablet, Disp: , Rfl:    empagliflozin (JARDIANCE) 10 MG TABS tablet, Take 1 tablet (10 mg total) by mouth daily., Disp: 90 tablet, Rfl: 2   EPIPEN 2-PAK 0.3 MG/0.3ML DEVI, Inject 0.3 mg into the muscle as needed., Disp: , Rfl:    fluticasone (FLONASE) 50 MCG/ACT nasal spray, Place into both nostrils daily., Disp: , Rfl:    ipratropium (ATROVENT) 0.03 % nasal spray, Place 2 sprays into both  nostrils as needed for rhinitis., Disp: 30 mL, Rfl: 5   metoprolol succinate (TOPROL XL) 25 MG 24 hr tablet, Take 1 tablet (25 mg total) by mouth daily., Disp: 90 tablet, Rfl: 3   montelukast (SINGULAIR) 10 MG tablet, Take 10 mg by mouth daily., Disp: , Rfl:    Nutritional Supplements (JUICE PLUS FIBRE PO), Take by mouth. 2 tabs twice daily, Disp: , Rfl:    omeprazole (PRILOSEC) 20 MG capsule, Take 20 mg by mouth daily., Disp: , Rfl:    pravastatin (PRAVACHOL) 20 MG tablet, Take 1 tablet (20 mg total) by mouth daily., Disp: 90 tablet, Rfl: 3   sacubitril-valsartan (ENTRESTO) 97-103 MG, Take 1 tablet by mouth 2 (two) times daily., Disp: 180 tablet, Rfl: 3   sertraline (ZOLOFT) 50 MG tablet, Take 1 tablet (50 mg total) by mouth daily., Disp: 90 tablet, Rfl: 3   sildenafil (VIAGRA) 100 MG tablet, Take 0.5 tablets (50 mg total) by mouth as needed for erectile dysfunction., Disp: 30 tablet, Rfl: 6   valACYclovir (VALTREX) 500 MG tablet, Take 1 tablet (500 mg total) by mouth 2 (two) times daily. 1 tablet twice daily for 1 day for each  episode of herpes simplex, Disp: 20 tablet, Rfl: 2   azelastine (ASTELIN) 0.1 % nasal spray, Place 2 sprays into both nostrils 2 (two) times daily. Use in each nostril as directed (Patient not taking: Reported on 01/28/2022), Disp: 30 mL, Rfl: 5   Olopatadine HCl (PATADAY OP), Apply to eye. (Patient not taking: Reported on 01/28/2022), Disp: , Rfl:    Review of Systems:   ROS Negative unless otherwise specified per HPI.   Vitals:   Vitals:   01/28/22 0924  BP: 100/68  Pulse: 79  Temp: 97.7 F (36.5 C)  TempSrc: Temporal  SpO2: 96%  Weight: 169 lb (76.7 kg)  Height: '5\' 10"'$  (1.778 m)     Body mass index is 24.25 kg/m.  Physical Exam:   Physical Exam Vitals and nursing note reviewed.  Constitutional:      General: He is not in acute distress.    Appearance: He is well-developed. He is not ill-appearing or toxic-appearing.  HENT:     Head: Normocephalic  and atraumatic.     Right Ear: Tympanic membrane, ear canal and external ear normal. Tympanic membrane is not erythematous, retracted or bulging.     Left  Ear: Tympanic membrane, ear canal and external ear normal. Tympanic membrane is not erythematous, retracted or bulging.     Ears:     Comments: B/l TM tubes present Scant drainage in L ear canal    Nose:     Right Sinus: Frontal sinus tenderness present. No maxillary sinus tenderness.     Left Sinus: Frontal sinus tenderness present. No maxillary sinus tenderness.     Mouth/Throat:     Pharynx: Uvula midline. No posterior oropharyngeal erythema.  Eyes:     General: Lids are normal.     Conjunctiva/sclera: Conjunctivae normal.  Neck:     Trachea: Trachea normal.  Cardiovascular:     Rate and Rhythm: Normal rate and regular rhythm.     Pulses: Normal pulses.     Heart sounds: Normal heart sounds, S1 normal and S2 normal.  Pulmonary:     Effort: Pulmonary effort is normal.     Breath sounds: Normal breath sounds. No decreased breath sounds, wheezing, rhonchi or rales.  Lymphadenopathy:     Cervical: No cervical adenopathy.  Skin:    General: Skin is warm and dry.  Neurological:     Mental Status: He is alert.     GCS: GCS eye subscore is 4. GCS verbal subscore is 5. GCS motor subscore is 6.  Psychiatric:        Speech: Speech normal.        Behavior: Behavior normal. Behavior is cooperative.     Assessment and Plan:   Acute cough No red flags on exam.  Will initiate oral Augmentin per orders. Discussed taking medications as prescribed. Reviewed return precautions including worsening fever, SOB, worsening cough or other concerns. Push fluids and rest. I recommend that patient follow-up if symptoms worsen or persist despite treatment x 7-10 days, sooner if needed.  Presence of tympanostomy tube in tympanic membrane Advised to use Ciprodex in left ear to help with drainage Also advised to wear plugs in his ears during his  upcoming trip as he will be in [place] water Follow-up with ENT prn  I,Savera Zaman,acting as a scribe for [employer], PA.,have documented all relevant documentation on the behalf of Inda Coke, PA,as directed by  Inda Coke, PA while in the presence of Inda Coke, [place].   I, Inda Coke, [place], have reviewed all documentation for this visit. The documentation on 01/28/22 for the exam, diagnosis, procedures, and orders are all accurate and complete.   Inda Coke, PA-C

## 2022-01-28 NOTE — Patient Instructions (Signed)
It was great to see you!  Use medication as prescribed: augmentin  Tessalon has been sent in  Push fluids and get plenty of rest. Please return if you are not improving as expected, or if you have high fevers (>101.5) or difficulty swallowing or worsening productive cough.  Call clinic with questions.  I hope you start feeling better soon!

## 2022-03-06 ENCOUNTER — Telehealth: Payer: Self-pay | Admitting: Cardiovascular Disease

## 2022-03-06 NOTE — Telephone Encounter (Signed)
Pt c/o Syncope: STAT if syncope occurred within 30 minutes and pt complains of lightheadedness High Priority if episode of passing out, completely, today or in last 24 hours   Did you pass out today? No   When is the last time you passed out? No   Has this occurred multiple times? No   Did you have any symptoms prior to passing out?  Patient had allergy shot yesterday and  almost passed out in allergist office and was told that it was due to consistent low bp 110/70. Pt would like a call back to discuss possible med change. Pt is not in town currently.

## 2022-03-06 NOTE — Telephone Encounter (Signed)
Spoke with patient by phone. He reports having a reaction to allergy injections at Dr. Alyson Reedy office on 7/26. He states his blood pressure dropped to 90 diastolic. Per the encounter on 7/26 with Dr. Harold Hedge he had an anaphylactic reaction to the allergy injection.  He is concerned his BP is running too low. I reviewed BPs from last 2 office visits and his baseline is similar to his BP yesterday prior to his injection and reaction.  I instructed him to keep a record of his Blood pressure 2 x day until his follow up appointment on 03/12/22 to review with APP. Patient verbalized understanding and will bring a list of BP readings to next appointment.

## 2022-03-12 ENCOUNTER — Ambulatory Visit (INDEPENDENT_AMBULATORY_CARE_PROVIDER_SITE_OTHER): Payer: Medicare Other | Admitting: Physician Assistant

## 2022-03-12 ENCOUNTER — Encounter: Payer: Self-pay | Admitting: Physician Assistant

## 2022-03-12 VITALS — BP 110/62 | HR 90 | Ht 70.0 in | Wt 173.2 lb

## 2022-03-12 DIAGNOSIS — R55 Syncope and collapse: Secondary | ICD-10-CM | POA: Diagnosis not present

## 2022-03-12 DIAGNOSIS — I10 Essential (primary) hypertension: Secondary | ICD-10-CM

## 2022-03-12 DIAGNOSIS — I428 Other cardiomyopathies: Secondary | ICD-10-CM

## 2022-03-12 NOTE — Progress Notes (Signed)
Cardiology Office Note:    Date:  03/12/2022   ID:  [name], DOB [birth year], MRN [id number]  PCP:  [name], MD  [facility] Cardiologist:  Jenkins Rouge, MD  [place] Electrophysiologist:  None   Chief Complaint: soft blood pressure   History of Present Illness:    [name] is a 66 y.o. male with a hx of non obstructive CAD by coronary CT, AML s/p bone marrow transplant x 2, chronic systolic CHF/NICM, HLD and HTN seen for soft blood pressure.   Hx of AML s/p bone marrow transplant x2, now in remission and myelodysplastic syndrome. After second transplant he had pulmonary edema from moderate MR? >>it was unclear what his EF was at that time. Appears that he had an echo at [facility] 09/14/20 that showed an EF at 45-50% with moderate global hypokinesis, mild MR, mild TR.   He previously underwent a coronary calcium score 01/25/2019 that was 77.2 that was considered 56th percentile for age and sex matched control with normal coronary origin. CTA performed 12/03/20 that showed minimal non-obstructive CAD (0-24%) with recommedartions to con distended to me and sider non-atherosclerotic causes of chest pain. Consider preventive therapy and risk factor modification. CMRI with EF of 35%. Dr. Johnsie Cancel felt his cardiomyopathy is likely due to prior cancer tx.   Patient takes allergy shot twice a week.  Last Wednesday he took higher dose of allergy shot and suddenly become dia for walks phoretic, pale with drop in blood pressure systolically to 15Q.  He got epinephrine with improved symptoms eventually.  No recurrent episodes since then.  He was seen by allergist who felt likely vasovagal response to higher dose.  Came here for further discussion. SBP > 110s since his episode.  Past Medical History:  Diagnosis Date   Acute myeloid leukemia in remission ([place]) 1999   Anemia    Arthritis    Depression    Diverticulosis of colon (without mention of hemorrhage)    Diverticulosis of colon  (without mention of hemorrhage) 04/21/2011   Esophageal reflux    Family hx colonic polyps    Family hx colonic polyps 04/21/2011   Hiatal hernia    Hiatal hernia 04/21/2011   History of kidney stones    Hyperlipemia    Hypertension    MDS (myelodysplastic syndrome) ([place])    Psychosexual dysfunction with inhibited sexual excitement     Past Surgical History:  Procedure Laterality Date   EAR EXAMINATION UNDER ANESTHESIA Right 10/29/2021   middle ear cleaned out infection, with an inplanted ear tube   KNEE SURGERY Left 08/14/2021   [facility]   SHOULDER SURGERY     x 3     Current Medications: Current Meds  Medication Sig   amoxicillin-clavulanate (AUGMENTIN) 875-125 MG tablet Take 1 tablet by mouth 2 (two) times daily.   Ascorbic Acid (VITAMIN C) 500 MG CAPS Take 500 mg by mouth daily.   azelastine (ASTELIN) 0.1 % nasal spray Place 2 sprays into both nostrils 2 (two) times daily. Use in each nostril as directed   B Complex Vitamins (VITAMIN B COMPLEX) TABS Take by mouth.   calcium-vitamin D (OSCAL WITH D) 500-200 MG-UNIT TABS tablet Take by mouth.   cetirizine (ZYRTEC) 10 MG tablet Take 10 mg by mouth daily.   Cyanocobalamin (VITAMIN B12) 1000 MCG TBCR 1 tablet   empagliflozin (JARDIANCE) 10 MG TABS tablet Take 1 tablet (10 mg total) by mouth daily.   EPIPEN 2-PAK 0.3 MG/0.3ML DEVI  Inject 0.3 mg into the muscle as needed.   fluticasone (FLONASE) 50 MCG/ACT nasal spray Place into both nostrils daily.   metoprolol succinate (TOPROL XL) 25 MG 24 hr tablet Take 1 tablet (25 mg total) by mouth daily.   Nutritional Supplements (JUICE PLUS FIBRE PO) Take by mouth. 2 tabs twice daily   Olopatadine HCl (PATADAY OP) Apply to eye.   omeprazole (PRILOSEC) 20 MG capsule Take 20 mg by mouth daily.   pravastatin (PRAVACHOL) 20 MG tablet Take 1 tablet (20 mg total) by mouth daily.   sacubitril-valsartan (ENTRESTO) 97-103 MG Take 1 tablet by mouth 2 (two) times daily.   sertraline (ZOLOFT) 50  MG tablet Take 1 tablet (50 mg total) by mouth daily.   sildenafil (VIAGRA) 100 MG tablet Take 0.5 tablets (50 mg total) by mouth as needed for erectile dysfunction.     Allergies:   Morphine and Marinol [dronabinol]   Social History   Socioeconomic History   Marital status: Married    Spouse name: Not on file   Number of children: 2   Years of education: Not on file   Highest education level: Not on file  Occupational History   Occupation: self employed    Employer: [employer]  Tobacco Use   Smoking status: Never   Smokeless tobacco: Never  Vaping Use   Vaping Use: Never used  Substance and Sexual Activity   Alcohol use: Yes    Comment: one occ    Drug use: No   Sexual activity: Never  Other Topics Concern   Not on file  Social History Narrative   2 caffeine drinks daily    Social Determinants of Health   Financial Resource Strain: Low Risk  (12/23/2021)   Overall Financial Resource Strain (CARDIA)    Difficulty of Paying Living Expenses: Not hard at all  Food Insecurity: No Food Insecurity (12/23/2021)   Hunger Vital Sign    Worried About Running Out of Food in the Last Year: Never true    [place] in the Last Year: Never true  Transportation Needs: No Transportation Needs (12/23/2021)   PRAPARE - Hydrologist (Medical): No    Lack of Transportation (Non-Medical): No  Physical Activity: Sufficiently Active (12/23/2021)   Exercise Vital Sign    Days of Exercise per Week: 5 days    Minutes of Exercise per Session: 40 min  Stress: No Stress Concern Present (12/23/2021)   [place]    Feeling of Stress : Not at all  Social Connections: [place] (12/23/2021)   Social Connection and Isolation Panel [NHANES]    Frequency of Communication with Friends and Family: More than three times a week    Frequency of Social Gatherings with Friends and Family: More than  three times a week    Attends Religious Services: More than 4 times per year    Active Member of [employer] or Organizations: Yes    Attends Archivist Meetings: 1 to 4 times per year    Marital Status: Married     Family History: The patient's family history includes Breast cancer in his mother; Cancer in his mother; Cancer (age of onset: 82) in his father; Celiac disease in his mother; Colon polyps in his mother; Heart disease in his father; Hypertension in his father; Prostate cancer in his father. There is no history of Colon cancer, Esophageal cancer, Inflammatory bowel disease, Liver disease,  Pancreatic cancer, Rectal cancer, or Stomach cancer.    ROS:   Please see the history of present illness.    All other systems reviewed and are negative.   EKGs/Labs/Other Studies Reviewed:    The following studies were reviewed today:   Coronary calcium score 01/25/2019:   FINDINGS: Non-cardiac: See separate report from [facility].   Ascending Aorta:   Pericardium: Normal   Coronary arteries: Normal coronary origins   IMPRESSION: Coronary calcium score of 77.2 . This was 56th percentile for age and sex matched control.   Normal coronary origins.   CTA 12/03/20:   IMPRESSION: 1. Coronary calcium score of 120. This was 58th percentile for age-, sex-, and race-matched controls.   2. Normal coronary origin with right dominance.   3. Minimal CAD (<25%) in the left main, LAD, and RCA.   RECOMMENDATIONS: 1. Minimal non-obstructive CAD (0-24%). Consider non-atherosclerotic causes of chest pain. Consider preventive therapy and risk factor Modification.   Echo 09/14/20:  SUMMARY  The left ventricular size is normal.  There is normal left ventricular wall thickness.  Left ventricular systolic function is mild to moderately reduced.  LV ejection fraction = 45-50%.  LV Global L Strain =-9.6%.  There is mild to moderate global hypokinesis of the left ventricle.   The right ventricle is normal in size and function.  There is mild mitral regurgitation.  There is mild tricuspid regurgitation.  Estimated right ventricular systolic pressure is 25 mmHg.  The aortic sinus is normal size.  IVC size was mildly dilated.  There is no pericardial effusion.  There is no significant change in comparison with the last  study11/08/2019.   cMRI 09/2021 IMPRESSION: 1. Normal LV size and wall thickness, diffuse hypokinesis with EF 36%.   2.  Normal RV size with moderate hypokinesis, EF 35%.   3. Non-coronary somewhat subtle LGE pattern in the basal septum. Mid-wall LGE most suggestive of prior myocarditis. Elevated extracellular volume percentage in the basal septum suggestive of fibrosis. Basal septal T2 not elevated, doubt active inflammation.   EKG:  EKG is not ordered today.    Recent Labs: 01/02/2022: ALT 19; BUN 28; Creatinine, Ser 1.10; Hemoglobin 13.1; Platelets 303.0; Potassium 4.6; Sodium 141; TSH 2.69  Recent Lipid Panel    Component Value Date/Time   CHOL 169 01/02/2022 0831   TRIG 79.0 01/02/2022 0831   TRIG 126 08/17/2006 0917   HDL 52.40 01/02/2022 0831   CHOLHDL 3 01/02/2022 0831   VLDL 15.8 01/02/2022 0831   LDLCALC 101 (H) 01/02/2022 0831   LDLDIRECT 146.2 07/29/2012 0830    Physical Exam:    VS:  BP 110/62   Pulse 90   Ht '5\' 10"'$  (1.778 m)   Wt 173 lb 3.2 oz (78.6 kg)   SpO2 98%   BMI 24.85 kg/m     Wt Readings from Last 3 Encounters:  03/12/22 173 lb 3.2 oz (78.6 kg)  01/28/22 169 lb (76.7 kg)  01/02/22 169 lb (76.7 kg)     GEN:  Well nourished, well developed in no acute distress HEENT: Normal NECK: No JVD; No carotid bruits LYMPHATICS: No lymphadenopathy CARDIAC: RRR, no murmurs, rubs, gallops RESPIRATORY:  Clear to auscultation without rales, wheezing or rhonchi  ABDOMEN: Soft, non-tender, non-distended MUSCULOSKELETAL:  No edema; No deformity  SKIN: Warm and dry NEUROLOGIC:  Alert and oriented x  3 PSYCHIATRIC:  Normal affect   ASSESSMENT AND PLAN:    Chronic systolic congestive heart failure/nonischemic cardiomyopathy EF is 35% based  on most recent cardiac MRI 09/2021.  Patient is euvolemic.  Continue Toprol-XL, Jardiance and Entresto.  2.  Vasovagal response Likely his episode is due to vasovagal response versus allergic reaction to short.  He is working closely with his allergy doctor.  Advised plenty of water and food before allergy shot.  Blood pressure systolically always greater than 110.  3.  Nonobstructive CAD -Continue statin  Medication Adjustments/Labs and Tests Ordered: Current medicines are reviewed at length with the patient today.  Concerns regarding medicines are outlined above.  No orders of the defined types were placed in this encounter.  No orders of the defined types were placed in this encounter.   Patient Instructions  Medication Instructions:  Your physician recommends that you continue on your current medications as directed. Please refer to the Current Medication list given to you today. *If you need a refill on your cardiac medications before your next appointment, please call your pharmacy*   Lab Work: None Ordered   Testing/Procedures: None Ordered   Follow-Up: At [facility], you and your health needs are our priority.  As part of our continuing mission to provide you with exceptional heart care, we have created designated Provider Care Teams.  These Care Teams include your primary Cardiologist (physician) and Advanced Practice Providers (APPs -  Physician Assistants and Nurse Practitioners) who all work together to provide you with the care you need, when you need it.  We recommend signing up for the patient portal called "MyChart".  Sign up information is provided on this After Visit Summary.  MyChart is used to connect with patients for Virtual Visits (Telemedicine).  Patients are able to view lab/test results, encounter notes,  upcoming appointments, etc.  Non-urgent messages can be sent to your provider as well.   To learn more about what you can do with MyChart, go to [email or web address].    Your next appointment:   6 month(s)  The format for your next appointment:   In Person  Provider:   Jenkins Rouge, MD     Other Instructions   Important Information About Sugar         Jarrett Soho, [place]  03/12/2022 4:04 PM    [place]

## 2022-03-12 NOTE — Patient Instructions (Signed)
Medication Instructions:  Your physician recommends that you continue on your current medications as directed. Please refer to the Current Medication list given to you today. *If you need a refill on your cardiac medications before your next appointment, please call your pharmacy*   Lab Work: None Ordered   Testing/Procedures: None Ordered   Follow-Up: At [facility], you and your health needs are our priority.  As part of our continuing mission to provide you with exceptional heart care, we have created designated Provider Care Teams.  These Care Teams include your primary Cardiologist (physician) and Advanced Practice Providers (APPs -  Physician Assistants and Nurse Practitioners) who all work together to provide you with the care you need, when you need it.  We recommend signing up for the patient portal called "MyChart".  Sign up information is provided on this After Visit Summary.  MyChart is used to connect with patients for Virtual Visits (Telemedicine).  Patients are able to view lab/test results, encounter notes, upcoming appointments, etc.  Non-urgent messages can be sent to your provider as well.   To learn more about what you can do with MyChart, go to [email or web address].    Your next appointment:   6 month(s)  The format for your next appointment:   In Person  Provider:   Jenkins Rouge, MD     Other Instructions   Important Information About Sugar

## 2022-04-02 ENCOUNTER — Ambulatory Visit (INDEPENDENT_AMBULATORY_CARE_PROVIDER_SITE_OTHER): Payer: Medicare Other | Admitting: Family

## 2022-04-02 ENCOUNTER — Encounter: Payer: Self-pay | Admitting: Family

## 2022-04-02 VITALS — BP 135/85 | HR 67 | Temp 97.7°F | Ht 70.0 in | Wt 168.0 lb

## 2022-04-02 DIAGNOSIS — J0111 Acute recurrent frontal sinusitis: Secondary | ICD-10-CM | POA: Diagnosis not present

## 2022-04-02 MED ORDER — AMOXICILLIN-POT CLAVULANATE 875-125 MG PO TABS: 1.0000 | ORAL_TABLET | Freq: Two times a day (BID) | ORAL | 0 refills | Status: DC

## 2022-04-02 NOTE — Progress Notes (Signed)
Patient ID: [name], male    DOB: 10-Dec-1955, 66 y.o.   MRN: [id number]  Chief Complaint  Patient presents with   Sinus Problem    Pt c/o sinus pressure, nasal congestion(yellow), headache and Wet cough(yellow). Has tried tessalon pearls twice  a day , Flonase and sudafed which did help a little. Present since 8/13. Covid negative at home on 8/17, 8/18.    HPI: Sinusitis: Patient complains of cough described as productive of yellow sputum, facial pain, headache described as frontal, nasal congestion, post nasal drip, purulent nasal discharge, and sinus pressure, with no fever, chills, night sweats or weight loss. Onset of symptoms was 10 days ago, unchanged since that time. He is drinking moderate amounts of fluids.  Past history is significant for no history of pneumonia or bronchitis. Patient is non-smoker.   Assessment & Plan:  1. Acute recurrent frontal sinusitis treated in June w/Augmentin, but had more coughing at that time, sending another round today, advised on use & SE. Advised on continuing nasal spray, using saline flush or spray prior to medicated sprays and use saline tid. Call office if sx are not resolving.  - amoxicillin-clavulanate (AUGMENTIN) 875-125 MG tablet; Take 1 tablet by mouth 2 (two) times daily after a meal.  Dispense: 14 tablet; Refill: 0   Subjective:    Outpatient Medications Prior to Visit  Medication Sig Dispense Refill   Ascorbic Acid (VITAMIN C) 500 MG CAPS Take 500 mg by mouth daily.     azelastine (ASTELIN) 0.1 % nasal spray Place 2 sprays into both nostrils 2 (two) times daily. Use in each nostril as directed 30 mL 5   B Complex Vitamins (VITAMIN B COMPLEX) TABS Take by mouth.     calcium-vitamin D (OSCAL WITH D) 500-200 MG-UNIT TABS tablet Take by mouth.     cetirizine (ZYRTEC) 10 MG tablet Take 10 mg by mouth daily.     Cyanocobalamin (VITAMIN B12) 1000 MCG TBCR 1 tablet     empagliflozin (JARDIANCE) 10 MG TABS tablet Take 1 tablet (10  mg total) by mouth daily. 90 tablet 2   EPIPEN 2-PAK 0.3 MG/0.3ML DEVI Inject 0.3 mg into the muscle as needed.     fluticasone (FLONASE) 50 MCG/ACT nasal spray Place into both nostrils daily.     metoprolol succinate (TOPROL XL) 25 MG 24 hr tablet Take 1 tablet (25 mg total) by mouth daily. 90 tablet 3   Nutritional Supplements (JUICE PLUS FIBRE PO) Take by mouth. 2 tabs twice daily     omeprazole (PRILOSEC) 20 MG capsule Take 20 mg by mouth daily.     pravastatin (PRAVACHOL) 20 MG tablet Take 1 tablet (20 mg total) by mouth daily. 90 tablet 3   sacubitril-valsartan (ENTRESTO) 97-103 MG Take 1 tablet by mouth 2 (two) times daily. 180 tablet 3   sertraline (ZOLOFT) 50 MG tablet Take 1 tablet (50 mg total) by mouth daily. 90 tablet 3   sildenafil (VIAGRA) 100 MG tablet Take 0.5 tablets (50 mg total) by mouth as needed for erectile dysfunction. 30 tablet 6   amoxicillin-clavulanate (AUGMENTIN) 875-125 MG tablet Take 1 tablet by mouth 2 (two) times daily. 14 tablet 0   benzonatate (TESSALON) 200 MG capsule TAKE 1 CAPSULE BY MOUTH 2 TIMES DAILY AS NEEDED FOR COUGH.     montelukast (SINGULAIR) 10 MG tablet 1 TABLET ORALLY ONCE A DAY, AT BEDTIME 30 DAY(S)     Olopatadine HCl (PATADAY OP) Apply to eye. (Patient not taking: Reported  on 04/02/2022)     No facility-administered medications prior to visit.   Past Medical History:  Diagnosis Date   Acute myeloid leukemia in remission ([place]) 1999   Anemia    Arthritis    Depression    Diverticulosis of colon (without mention of hemorrhage)    Diverticulosis of colon (without mention of hemorrhage) 04/21/2011   Esophageal reflux    Family hx colonic polyps    Family hx colonic polyps 04/21/2011   Hiatal hernia    Hiatal hernia 04/21/2011   History of kidney stones    Hyperlipemia    Hypertension    MDS (myelodysplastic syndrome) ([place])    Psychosexual dysfunction with inhibited sexual excitement    Past Surgical History:  Procedure Laterality Date    EAR EXAMINATION UNDER ANESTHESIA Right 10/29/2021   middle ear cleaned out infection, with an inplanted ear tube   KNEE SURGERY Left 08/14/2021   [facility]   SHOULDER SURGERY     x 3    Allergies  Allergen Reactions   Morphine Other (See Comments)    UNKNOWN - REACTION IS NOT LISTED   Marinol [Dronabinol]     Delusions.      Objective:    Physical Exam Vitals and nursing note reviewed.  Constitutional:      General: He is not in acute distress.    Appearance: Normal appearance.  HENT:     Head: Normocephalic.     Right Ear: Ear canal normal. No decreased hearing noted. Drainage (scant, purulent) present. No swelling or tenderness. A PE tube is present.     Left Ear: Ear canal normal. A PE tube is present.     Nose:     Right Sinus: Maxillary sinus tenderness and frontal sinus tenderness present.     Left Sinus: Maxillary sinus tenderness and frontal sinus tenderness present.     Mouth/Throat:     Mouth: Mucous membranes are moist.     Pharynx: Oropharyngeal exudate present. No pharyngeal swelling, posterior oropharyngeal erythema or uvula swelling.  Cardiovascular:     Rate and Rhythm: Normal rate and regular rhythm.  Pulmonary:     Effort: Pulmonary effort is normal.     Breath sounds: Normal breath sounds.  Musculoskeletal:        General: Normal range of motion.     Cervical back: Normal range of motion.  Skin:    General: Skin is warm and dry.  Neurological:     Mental Status: He is alert and oriented to person, place, and time.  Psychiatric:        Mood and Affect: Mood normal.    BP 135/85 (BP Location: Left Arm, Patient Position: Sitting, Cuff Size: Large)   Pulse 67   Temp 97.7 F (36.5 C) (Temporal)   Ht '5\' 10"'$  (1.778 m)   Wt 168 lb (76.2 kg)   SpO2 96%   BMI 24.11 kg/m  Wt Readings from Last 3 Encounters:  04/02/22 168 lb (76.2 kg)  03/12/22 173 lb 3.2 oz (78.6 kg)  01/28/22 169 lb (76.7 kg)      Jeanie Sewer, NP

## 2022-04-17 NOTE — Progress Notes (Signed)
CARDIOLOGY CONSULT NOTE     Patient ID: [name] MRN: [id number] DOB/AGE: 67-Apr-1957 67 y.o.  Admit date: (Not on file) Referring Physician: Jerline [name] Primary Physician: Melvin Barrack, MD Primary Cardiologist: [name] Reason for Consultation: CAD    HPI: 66 y.o. referred by Dr [name] 10/12/20  for family history of CAD, HTN and chronic systolic CHF   History of AML with BMT in 2000.  Diagnosed with MDS in 2020 and had repeat transplant 06/08/19  Post 2nd tranplant had pulmonary edema ? From moderate MR not clear what EF was at that time although notes indicate patient concerned recently that EF had gone done from 50-55% and his HR has been running higher He has seen [name] cardiology [name] and there note indicates starting ACE 06/15/20 with plan to add beta blocker and stopped norvasc.    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 Has a son and daughter working with him All of them went to [name]. Enjoys golf and has 3 grand kids.  Met his wife of over 44 years at [name] as well  Cardiac CT 12/03/20 calcium score 120 58 th percentile CAD RADS one non obstructive CAD  He is having a lot of issues with recurrent allergies since his 2 nd transplant Has had recurrent ear infections and in general just feels miserable and anxious about it all Told him he should not stop his beta blocker as his allergist recommended as it is first line Rx for his DCM and if he needed an epi pen for allergic reaction it would still work as  intended Started allergy shots and dose increased beginning of August with drop in BP needing epi Allergist felt he might have had a vagal reaction Seen by our PA 08/16/03 and systolic BP 697 mmHg and GDMT not decreased   Some issues with donut hole with [name] and [name]  Sinus infection on [name] now     ROS All other systems reviewed and negative except as noted above  Past Medical History:  Diagnosis Date   Acute myeloid leukemia in remission ([place]) 1999   Anemia    Arthritis    Depression    Diverticulosis of colon (without mention of hemorrhage)    Diverticulosis of colon (without mention of hemorrhage) 04/21/2011   Esophageal reflux    Family hx colonic polyps    Family hx colonic polyps 04/21/2011   Hiatal hernia    Hiatal hernia 04/21/2011   History of kidney stones    Hyperlipemia    Hypertension    MDS (myelodysplastic syndrome) ([place])    Psychosexual dysfunction with inhibited sexual excitement     Family History  Problem Relation Age of Onset   Hypertension Father    Heart disease Father    Prostate cancer Father        dx late 39s   Cancer Father 61       prostate ca   Celiac disease Mother    Colon polyps Mother  Breast cancer Mother    Cancer Mother        Bladder   Colon cancer Neg Hx    Esophageal cancer Neg Hx    Inflammatory bowel disease Neg Hx    Liver disease Neg Hx    Pancreatic cancer Neg Hx    Rectal cancer Neg Hx    Stomach cancer Neg Hx     Social History   Socioeconomic History   Marital status: Married    Spouse name: Not on file   Number of children: 2   Years of education: Not on file   Highest education level: Not on file  Occupational History   Occupation: self employed    Employer: [name]  Tobacco Use   Smoking status: Never   Smokeless tobacco: Never  Vaping Use   Vaping Use: Never used  Substance and Sexual Activity   Alcohol use: Yes    Comment: one occ    Drug use: No   Sexual activity: Never  Other  Topics Concern   Not on file  Social History Narrative   2 caffeine drinks daily    Social Determinants of Health   Financial Resource Strain: Low Risk  (12/23/2021)   Overall Financial Resource Strain (CARDIA)    Difficulty of Paying Living Expenses: Not hard at all  Food Insecurity: No Food Insecurity (12/23/2021)   Hunger Vital Sign    Worried About Running Out of Food in the Last Year: Never true    Ran Out of Food in the Last Year: Never true  Transportation Needs: No Transportation Needs (12/23/2021)   PRAPARE - Hydrologist (Medical): No    Lack of Transportation (Non-Medical): No  Physical Activity: Sufficiently Active (12/23/2021)   Exercise Vital Sign    Days of Exercise per Week: 5 days    Minutes of Exercise per Session: 40 min  Stress: No Stress Concern Present (12/23/2021)   [place]    Feeling of Stress : Not at all  Social Connections: [place] (12/23/2021)   Social Connection and Isolation Panel [NHANES]    Frequency of Communication with Friends and Family: More than three times a week    Frequency of Social Gatherings with Friends and Family: More than three times a week    Attends Religious Services: More than 4 times per year    Active Member of [employer] or Organizations: Yes    Attends Archivist Meetings: 1 to 4 times per year    Marital Status: Married  Human resources officer Violence: Not At Risk (12/23/2021)   Humiliation, Afraid, Rape, and Kick questionnaire    Fear of Current or Ex-Partner: No    Emotionally Abused: No    Physically Abused: No    Sexually Abused: No    Past Surgical History:  Procedure Laterality Date   EAR EXAMINATION UNDER ANESTHESIA Right 10/29/2021   middle ear cleaned out infection, with an inplanted ear tube   KNEE SURGERY Left 08/14/2021   [facility]   SHOULDER SURGERY     x 3       Current Outpatient Medications:     amoxicillin-clavulanate ([name]) 875-125 MG tablet, Take 1 tablet by mouth 2 (two) times daily after a meal., Disp: 14 tablet, Rfl: 0   Ascorbic Acid (VITAMIN C) 500 MG CAPS, Take 500 mg by mouth daily., Disp: , Rfl:    azelastine (ASTELIN) 0.1 % nasal spray, Place  2 sprays into both nostrils 2 (two) times daily. Use in each nostril as directed, Disp: 30 mL, Rfl: 5   B Complex Vitamins (VITAMIN B COMPLEX) TABS, Take by mouth., Disp: , Rfl:    benzonatate (TESSALON PERLES) 100 MG capsule, Take 1 capsule (100 mg total) by mouth 3 (three) times daily as needed., Disp: 20 capsule, Rfl: 0   calcium-vitamin D (OSCAL WITH D) 500-200 MG-UNIT TABS tablet, Take by mouth., Disp: , Rfl:    cetirizine (ZYRTEC) 10 MG tablet, Take 10 mg by mouth daily., Disp: , Rfl:    Cyanocobalamin (VITAMIN B12) 1000 MCG TBCR, 1 tablet, Disp: , Rfl:    empagliflozin ([name]) 10 MG TABS tablet, Take 1 tablet (10 mg total) by mouth daily., Disp: 90 tablet, Rfl: 2   EPIPEN 2-PAK 0.3 MG/0.3ML DEVI, Inject 0.3 mg into the muscle as needed., Disp: , Rfl:    fluticasone (FLONASE) 50 MCG/ACT nasal spray, Place into both nostrils daily., Disp: , Rfl:    ipratropium (ATROVENT) 0.03 % nasal spray, Place 2 sprays into both nostrils every 12 (twelve) hours., Disp: , Rfl:    metoprolol succinate (TOPROL XL) 25 MG 24 hr tablet, Take 1 tablet (25 mg total) by mouth daily., Disp: 90 tablet, Rfl: 3   montelukast (SINGULAIR) 10 MG tablet, 1 TABLET ORALLY ONCE A DAY, AT BEDTIME 30 DAY(S), Disp: , Rfl:    Nutritional Supplements (JUICE PLUS FIBRE PO), Take by mouth. 2 tabs twice daily, Disp: , Rfl:    omeprazole (PRILOSEC) 20 MG capsule, Take 20 mg by mouth daily., Disp: , Rfl:    pravastatin (PRAVACHOL) 20 MG tablet, Take 1 tablet (20 mg total) by mouth daily., Disp: 90 tablet, Rfl: 3   sacubitril-valsartan ([name]) 97-103 MG, Take 1 tablet by mouth 2 (two) times daily., Disp: 180 tablet, Rfl: 3   sertraline (ZOLOFT) 50 MG tablet, Take  1 tablet (50 mg total) by mouth daily., Disp: 90 tablet, Rfl: 3   sildenafil (VIAGRA) 100 MG tablet, Take 0.5 tablets (50 mg total) by mouth as needed for erectile dysfunction., Disp: 30 tablet, Rfl: 6   Olopatadine HCl (PATADAY OP), Apply to eye. (Patient not taking: Reported on 04/02/2022), Disp: , Rfl:     Physical Exam: Blood pressure 102/70, pulse 74, height _0  (1.778 m), weight 170 lb (77.1 kg), SpO2 99 %.   Affect appropriate Healthy:  appears stated age 30: normal Neck supple with no adenopathy JVP normal no bruits no thyromegaly Lungs clear with no wheezing and good diaphragmatic motion Heart:  S1/S2 no murmur, no rub, gallop or click PMI normal Abdomen: benighn, BS positve, no tenderness, no AAA no bruit.  No HSM or HJR Distal pulses intact with no bruits No edema Neuro non-focal Bilateral knee arthritis     Labs:   Lab Results  Component Value Date   WBC 4.7 01/02/2022   HGB 13.1 01/02/2022   HCT 40.2 01/02/2022   MCV 95.9 01/02/2022   PLT 303.0 01/02/2022   No results for input(s): "NA", "K", "CL", "CO2", "BUN", "CREATININE", "CALCIUM", "PROT", "BILITOT", "ALKPHOS", "ALT", "AST", "GLUCOSE" in the last 168 hours.  Invalid input(s): "LABALBU" Lab Results  Component Value Date   CKTOTAL 210 03/18/2012   CKMB 3.9 03/18/2012   TROPONINI <0.30 03/18/2012    Lab Results  Component Value Date   CHOL 169 01/02/2022   CHOL 140 11/27/2020   CHOL 204 (H) 11/22/2019   Lab Results  Component Value Date   HDL 52.40 01/02/2022  HDL 44.40 11/27/2020   HDL 48.40 11/22/2019   Lab Results  Component Value Date   LDLCALC 101 (H) 01/02/2022   LDLCALC 76 11/27/2020   LDLCALC 130 (H) 11/22/2019   Lab Results  Component Value Date   TRIG 79.0 01/02/2022   TRIG 96.0 11/27/2020   TRIG 128.0 11/22/2019   Lab Results  Component Value Date   CHOLHDL 3 01/02/2022   CHOLHDL 3 11/27/2020   CHOLHDL 4 11/22/2019   Lab Results  Component Value Date    LDLDIRECT 146.2 07/29/2012   LDLDIRECT 136.9 05/12/2011   LDLDIRECT 134.5 01/29/2010      Radiology: No results found.   EKG: 2014 SR LAFB LVH  04/28/2022 NSR LAFB LVH no changes 04/28/2022 SR rate 86 RBBB/LAFB    ASSESSMENT AND PLAN:   1. Family History CAD:  average calcium score no obstructive dx on CT 12/03/20 2. HLD:  On statin LDL 101 on pravastatin 20 mg  3. AML/BMT:  Stable post transplant x 2 4. Chronic Systolic CHF: ? Related to Rx for cancer On optimal medical Rx now EF 36% by MRI 10/07/21  He has no volume overload what so ever and do not feel aldactone needed BP stable on current doses  5. Ortho:  bilateral knee issues f/u [facility] post arthroscopic surgery on right and likely will have left done after golf season  6. Allergies:  with immunosuppression from BMT continue shots ? Vagal reaction August when dose increased has Epi pens prefer not to lower [name] dose for now    F/U in  6 months   Signed: Jenkins Rouge 04/28/2022, 9:40 AM

## 2022-04-25 ENCOUNTER — Telehealth: Payer: Self-pay | Admitting: Family Medicine

## 2022-04-25 ENCOUNTER — Encounter: Payer: Self-pay | Admitting: Family Medicine

## 2022-04-25 ENCOUNTER — Other Ambulatory Visit: Payer: Self-pay | Admitting: Family Medicine

## 2022-04-25 DIAGNOSIS — J0111 Acute recurrent frontal sinusitis: Secondary | ICD-10-CM

## 2022-04-25 MED ORDER — AMOXICILLIN-POT CLAVULANATE 875-125 MG PO TABS: 1.0000 | ORAL_TABLET | Freq: Two times a day (BID) | ORAL | 0 refills | Status: DC

## 2022-04-25 MED ORDER — BENZONATATE 100 MG PO CAPS: 100.0000 mg | ORAL_CAPSULE | Freq: Three times a day (TID) | ORAL | 0 refills | Status: DC | PRN

## 2022-04-25 NOTE — Progress Notes (Signed)
Per phone call by pt-covering

## 2022-04-25 NOTE — Telephone Encounter (Signed)
Ok to send in one more round but needs visit if not improving.  [name]. Melvin Pain, MD 04/25/2022 3:18 PM

## 2022-04-25 NOTE — Telephone Encounter (Signed)
Pt states: -Seen in Office 08/23 for this complaint, saw SH,NP. -Still experiencing: Headache  Sinus pressure Forehead pressure Still not "up to par" / not normal Still blowing nose regularly Slight post nasal drip Ears popping. -pt is out of town, [place] , [place] -Limited access to phone today 09/15 -Best available by text, mychart message may also be an option.   Pt requests: -Rx extension of antibiotics.   Pt declined ov/vv Pt acknowledged option for Virtual Urgent Care through [facility].    Ph[facility]: [facility] Store ID: [id number] [address],  [place], [place] [address] [phone]

## 2022-04-28 ENCOUNTER — Ambulatory Visit: Payer: Medicare Other | Attending: Cardiovascular Disease | Admitting: Cardiovascular Disease

## 2022-04-28 ENCOUNTER — Encounter: Payer: Self-pay | Admitting: Cardiovascular Disease

## 2022-04-28 VITALS — BP 102/70 | HR 74 | Ht 70.0 in | Wt 170.0 lb

## 2022-04-28 DIAGNOSIS — E785 Hyperlipidemia, unspecified: Secondary | ICD-10-CM | POA: Insufficient documentation

## 2022-04-28 DIAGNOSIS — I428 Other cardiomyopathies: Secondary | ICD-10-CM | POA: Diagnosis present

## 2022-04-28 DIAGNOSIS — R55 Syncope and collapse: Secondary | ICD-10-CM | POA: Insufficient documentation

## 2022-04-28 NOTE — Patient Instructions (Signed)
Medication Instructions:  Your physician recommends that you continue on your current medications as directed. Please refer to the Current Medication list given to you today.  *If you need a refill on your cardiac medications before your next appointment, please call your pharmacy*  Lab Work: If you have labs (blood work) drawn today and your tests are completely normal, you will receive your results only by: MyChart Message (if you have MyChart) OR A paper copy in the mail If you have any lab test that is abnormal or we need to change your treatment, we will call you to review the results.  Testing/Procedures: None ordered today.  Follow-Up: At [facility], you and your health needs are our priority.  As part of our continuing mission to provide you with exceptional heart care, we have created designated Provider Care Teams.  These Care Teams include your primary Cardiologist (physician) and Advanced Practice Providers (APPs -  Physician Assistants and Nurse Practitioners) who all work together to provide you with the care you need, when you need it.  We recommend signing up for the patient portal called "MyChart".  Sign up information is provided on this After Visit Summary.  MyChart is used to connect with patients for Virtual Visits (Telemedicine).  Patients are able to view lab/test results, encounter notes, upcoming appointments, etc.  Non-urgent messages can be sent to your provider as well.   To learn more about what you can do with MyChart, go to [email or web address].    Your next appointment:    6 month(s)  The format for your next appointment:   In Person  Provider:   Peter Nishan, MD     Important Information About Sugar       

## 2022-04-28 NOTE — Telephone Encounter (Signed)
Medication was ordered.  Patient Name: [name] Gender: Male DOB: 03-Mar-1956 Age: 66 Y 36 M 25 D Return Phone Number: [phone] (Primary) Address: City/ State/ Zip: [place] [employer]  [address] Client [place] [facility] [place] Client Site [place] [facility] [place] Night Provider Dimas Chyle- MD Contact Type Call Who Is Calling Patient / Member / Family / Caregiver Call Type Triage / Clinical Relationship To Patient Self Return Phone Number [phone] (Primary) Chief Complaint Nasal Congestion Reason for Call Medication Question / Request Initial Comment Caller states he is wanting a refill for Antibiotic sent to the pharmacy. Sinus Infection. Translation No Disp. Time Eilene Ghazi Time) Disposition Final User 04/25/2022 5:34:26 PM Clinical Call Yes [name] RN, Ludger Nutting Final Disposition 04/25/2022 5:34:26 PM Clinical Call Yes [name], RN, Ludger Nutting Comments User: Gorden Harms, RN Date/Time Eilene Ghazi Time): 04/25/2022 5:34:17 PM caller states that he received a message that the abx had been called in to the pharm

## 2022-05-05 ENCOUNTER — Encounter: Payer: Self-pay | Admitting: *Deleted

## 2022-05-07 NOTE — Telephone Encounter (Signed)
Patient was able to get scheduled for 09/28 @ 11:40am to discuss continued sinus pressure. Unfortunately, he had to call back and ask for OV to be changed to VV due to wife testing positive for covid.   Patient states: -He just wanted to discuss a plan with PCP to combat continued sinus pressure - He is not having symptoms currently that resemble COVID   Can this visit be virtual? Please advise.

## 2022-05-08 ENCOUNTER — Encounter: Payer: Self-pay | Admitting: Family Medicine

## 2022-05-08 ENCOUNTER — Telehealth (INDEPENDENT_AMBULATORY_CARE_PROVIDER_SITE_OTHER): Payer: Medicare Other | Admitting: Family Medicine

## 2022-05-08 VITALS — Ht 70.0 in

## 2022-05-08 DIAGNOSIS — J302 Other seasonal allergic rhinitis: Secondary | ICD-10-CM | POA: Diagnosis not present

## 2022-05-08 DIAGNOSIS — J329 Chronic sinusitis, unspecified: Secondary | ICD-10-CM | POA: Diagnosis not present

## 2022-05-08 MED ORDER — NIRMATRELVIR/RITONAVIR (PAXLOVID)TABLET: 3.0000 | ORAL_TABLET | Freq: Two times a day (BID) | ORAL | 0 refills | Status: AC

## 2022-05-08 MED ORDER — AMOXICILLIN-POT CLAVULANATE 875-125 MG PO TABS: 1.0000 | ORAL_TABLET | Freq: Two times a day (BID) | ORAL | 0 refills | Status: DC

## 2022-05-08 NOTE — Assessment & Plan Note (Signed)
Could be contributing to his recurrent sinusitis.  Is following with allergist for this and symptoms seem to be slowly improving.  Will defer further management to his allergist.

## 2022-05-08 NOTE — Assessment & Plan Note (Signed)
Had a lengthy session with patient regarding treatment plan.  He has had multiple rounds of antibiotics over the past several months due to recurrent sinusitis.  We will send a proper prescription for Augmentin today.  He is aware to not start this unless he develops symptoms.  He will follow-up with ENT soon to discuss further management options.

## 2022-05-08 NOTE — Progress Notes (Signed)
   [name] is a 66 y.o. male who presents today for a virtual office visit.  Assessment/Plan:  New/Acute Problems: COVID exposure Took a home test today which was negative.  Not currently exhibiting any symptoms.  Due to his atypical immune system status post BMT x2 we will send in pocket scription for paxlovid.  He knows to not start this unless symptoms develop or if his COVID test is positive.  He is also aware to stop his statin while he is on this.  Chronic Problems Addressed Today: Recurrent sinusitis Had a lengthy session with patient regarding treatment plan.  He has had multiple rounds of antibiotics over the past several months due to recurrent sinusitis.  We will send a proper prescription for Augmentin today.  He is aware to not start this unless he develops symptoms.  He will follow-up with ENT soon to discuss further management options.  Seasonal allergies Could be contributing to his recurrent sinusitis.  Is following with allergist for this and symptoms seem to be slowly improving.  Will defer further management to his allergist.     Subjective:  HPI:  See A/p for status of chronic conditions.    He is doing well.  He is having issues with recurrent sinusitis.  He has had have multiple rounds of antibiotics over the last several months due to this.  He has been following with the allergist for his seasonal allergies that he acquired due to bone marrow transplant in a few years ago.  He has been ramping up on his allergy shots which seems to help.  Does not currently have any symptoms.  His wife was recently diagnosed with COVID.  He has not developed any symptoms from this as of yet.        Objective/Observations  Physical Exam: Gen: NAD, resting comfortably Pulm: Normal work of breathing Neuro: Grossly normal, moves all extremities Psych: Normal affect and thought content  Virtual Visit via Video   I connected with [name] on 05/08/22 at 11:40 AM  EDT by a video enabled telemedicine application and verified that I am speaking with the correct person using two identifiers. The limitations of evaluation and management by telemedicine and the availability of in person appointments were discussed. The patient expressed understanding and agreed to proceed.   Patient location: Home Provider location: [place] participating in the virtual visit: Myself and Patient     Algis Greenhouse. [name], MD 05/08/2022 12:28 PM

## 2022-05-08 NOTE — Telephone Encounter (Signed)
Please schedule patient.

## 2022-06-06 ENCOUNTER — Telehealth: Payer: Self-pay | Admitting: Family Medicine

## 2022-06-06 NOTE — Telephone Encounter (Signed)
Pt states: -Just completed follow up appointments with [facility] about bone marrow -He needs a bone density test and was told to reach out to PCP.   Pt requests: -Orders to be written. -to schedule bone density exam - not available on Fridays.   Pt declined OV.

## 2022-06-12 NOTE — Telephone Encounter (Signed)
Please advise,

## 2022-06-12 NOTE — Telephone Encounter (Signed)
Ok with me. Please place any necessary orders. 

## 2022-06-13 ENCOUNTER — Other Ambulatory Visit: Payer: Self-pay | Admitting: *Deleted

## 2022-06-13 DIAGNOSIS — M542 Cervicalgia: Secondary | ICD-10-CM

## 2022-06-13 DIAGNOSIS — M4850XA Collapsed vertebra, not elsewhere classified, site unspecified, initial encounter for fracture: Secondary | ICD-10-CM

## 2022-06-13 DIAGNOSIS — M8000XP Age-related osteoporosis with current pathological fracture, unspecified site, subsequent encounter for fracture with malunion: Secondary | ICD-10-CM

## 2022-06-13 DIAGNOSIS — M8580 Other specified disorders of bone density and structure, unspecified site: Secondary | ICD-10-CM

## 2022-06-13 NOTE — Telephone Encounter (Signed)
Order placed

## 2022-07-01 ENCOUNTER — Ambulatory Visit (INDEPENDENT_AMBULATORY_CARE_PROVIDER_SITE_OTHER)
Admission: RE | Admit: 2022-07-01 | Discharge: 2022-07-01 | Disposition: A | Discharge status: Home / Self Care | Payer: Medicare Other | Source: Ambulatory Visit | Attending: Family Medicine | Admitting: Family Medicine

## 2022-07-01 DIAGNOSIS — M4850XA Collapsed vertebra, not elsewhere classified, site unspecified, initial encounter for fracture: Secondary | ICD-10-CM | POA: Diagnosis not present

## 2022-07-01 DIAGNOSIS — M8580 Other specified disorders of bone density and structure, unspecified site: Secondary | ICD-10-CM

## 2022-07-08 ENCOUNTER — Encounter: Payer: Self-pay | Admitting: Family Medicine

## 2022-07-08 DIAGNOSIS — M8580 Other specified disorders of bone density and structure, unspecified site: Secondary | ICD-10-CM | POA: Insufficient documentation

## 2022-07-08 NOTE — Progress Notes (Signed)
Please inform patient of the following:  DEXA scan shows mild thinning of the bones.  Do not need to start medications.  He should continue with calcium and vitamin D supplementation and we can recheck in 2 years.

## 2022-07-11 ENCOUNTER — Telehealth (INDEPENDENT_AMBULATORY_CARE_PROVIDER_SITE_OTHER): Payer: Medicare Other | Admitting: Family Medicine

## 2022-07-11 VITALS — Ht 70.0 in | Wt 167.0 lb

## 2022-07-11 DIAGNOSIS — N400 Enlarged prostate without lower urinary tract symptoms: Secondary | ICD-10-CM | POA: Diagnosis not present

## 2022-07-11 DIAGNOSIS — J329 Chronic sinusitis, unspecified: Secondary | ICD-10-CM

## 2022-07-11 DIAGNOSIS — N529 Male erectile dysfunction, unspecified: Secondary | ICD-10-CM | POA: Diagnosis not present

## 2022-07-11 DIAGNOSIS — M8580 Other specified disorders of bone density and structure, unspecified site: Secondary | ICD-10-CM | POA: Diagnosis not present

## 2022-07-11 MED ORDER — AZITHROMYCIN 250 MG PO TABS: ORAL_TABLET | ORAL | 0 refills | Status: DC

## 2022-07-11 MED ORDER — AMOXICILLIN-POT CLAVULANATE 875-125 MG PO TABS: 1.0000 | ORAL_TABLET | Freq: Two times a day (BID) | ORAL | 0 refills | Status: DC

## 2022-07-11 MED ORDER — TADALAFIL 10 MG PO TABS: 10.0000 mg | ORAL_TABLET | Freq: Every day | ORAL | 3 refills | Status: DC

## 2022-07-11 NOTE — Assessment & Plan Note (Signed)
Not having much improvement with Viagra.  Given concurrent BPH we will switch over to Cialis 10 mg daily.  We did discuss potential side effects.  He will follow-up me in a couple weeks via MyChart.

## 2022-07-11 NOTE — Assessment & Plan Note (Signed)
Still has quite a bit of symptoms.  We will be switching to Cialis as above which should hopefully help some with his BPH symptoms.

## 2022-07-11 NOTE — Progress Notes (Signed)
   [name] is a 66 y.o. male who presents today for a virtual office visit.  Assessment/Plan:  Chronic Problems Addressed Today: Recurrent sinusitis Patient with recurrent sinus infection over the last few weeks.  He does have upcoming appoint with ENT next month to further discuss and evaluate his issues with recurrent sinusitis.  Does have history of bone marrow transplant which is potentially contributing as well.  Given length of symptoms and rebound after finishing previous course of Augmentin we will start azithromycin.  We will continue his other medications.  He will let me know if not proving over the next several days and will consider trial of Levaquin.  He has been on this before.  We did discuss that this is usually held as a last resort due to significant side effects including tendon rupture, GI illness, and cardiac conduction abnormalities.  Osteopenia last DEXA 2023 Recent DEXA scan showed improved bone density compared to previous.  He will continue calcium vitamin D supplementation and we can recheck in 2 years.  Erectile dysfunction Not having much improvement with Viagra.  Given concurrent BPH we will switch over to Cialis 10 mg daily.  We did discuss potential side effects.  He will follow-up me in a couple weeks via MyChart.  BPH (benign prostatic hyperplasia) Still has quite a bit of symptoms.  We will be switching to Cialis as above which should hopefully help some with his BPH symptoms.     Subjective:  HPI:  PAtient here with sinus congestion. Started a couple of weeks ago. He started some left over augmentin which really helped with his symptoms and symptoms essentially resolved. Unfortunately over the last couple of days he has had a rebound of his symptoms. A lot facial pressure and pain. He has had some thick green mucus as well. He has had some body aches. No fevers or chills. He has tried taking cough medications with modest improvement. Covid test at  home was negative.       Objective/Observations  Physical Exam: Gen: NAD, resting comfortably Pulm: Normal work of breathing Neuro: Grossly normal, moves all extremities Psych: Normal affect and thought content  Virtual Visit via Video   I connected with [name] on 07/11/22 at  8:40 AM EST by a video enabled telemedicine application and verified that I am speaking with the correct person using two identifiers. The limitations of evaluation and management by telemedicine and the availability of in person appointments were discussed. The patient expressed understanding and agreed to proceed.   Patient location: Home Provider location: [place] participating in the virtual visit: Myself and Patient     Algis Greenhouse. [name], MD 07/11/2022 9:25 AM

## 2022-07-11 NOTE — Assessment & Plan Note (Signed)
Patient with recurrent sinus infection over the last few weeks.  He does have upcoming appoint with ENT next month to further discuss and evaluate his issues with recurrent sinusitis.  Does have history of bone marrow transplant which is potentially contributing as well.  Given length of symptoms and rebound after finishing previous course of Augmentin we will start azithromycin.  We will continue his other medications.  He will let me know if not proving over the next several days and will consider trial of Levaquin.  He has been on this before.  We did discuss that this is usually held as a last resort due to significant side effects including tendon rupture, GI illness, and cardiac conduction abnormalities.

## 2022-07-11 NOTE — Assessment & Plan Note (Signed)
Recent DEXA scan showed improved bone density compared to previous.  He will continue calcium vitamin D supplementation and we can recheck in 2 years.

## 2022-07-18 ENCOUNTER — Encounter: Payer: Self-pay | Admitting: Family Medicine

## 2022-07-18 ENCOUNTER — Ambulatory Visit (INDEPENDENT_AMBULATORY_CARE_PROVIDER_SITE_OTHER): Payer: Medicare Other | Admitting: Family Medicine

## 2022-07-18 VITALS — BP 122/70 | HR 75 | Temp 97.1°F | Ht 70.0 in | Wt 169.0 lb

## 2022-07-18 DIAGNOSIS — J302 Other seasonal allergic rhinitis: Secondary | ICD-10-CM

## 2022-07-18 DIAGNOSIS — I10 Essential (primary) hypertension: Secondary | ICD-10-CM

## 2022-07-18 DIAGNOSIS — J329 Chronic sinusitis, unspecified: Secondary | ICD-10-CM | POA: Diagnosis not present

## 2022-07-18 DIAGNOSIS — N400 Enlarged prostate without lower urinary tract symptoms: Secondary | ICD-10-CM

## 2022-07-18 DIAGNOSIS — N529 Male erectile dysfunction, unspecified: Secondary | ICD-10-CM

## 2022-07-18 LAB — CBC WITH DIFFERENTIAL/PLATELET
Basophils Absolute: 0 10*3/uL (ref 0.0–0.1)
Basophils Relative: 0.4 % (ref 0.0–3.0)
Eosinophils Absolute: 0.1 10*3/uL (ref 0.0–0.7)
Eosinophils Relative: 2.5 % (ref 0.0–5.0)
HCT: 40.1 % (ref 39.0–52.0)
Hemoglobin: 13.4 g/dL (ref 13.0–17.0)
Lymphocytes Relative: 39.9 % (ref 12.0–46.0)
Lymphs Abs: 2 10*3/uL (ref 0.7–4.0)
MCHC: 33.3 g/dL (ref 30.0–36.0)
MCV: 95.2 fl (ref 78.0–100.0)
Monocytes Absolute: 0.7 10*3/uL (ref 0.1–1.0)
Monocytes Relative: 14.2 % — ABNORMAL HIGH (ref 3.0–12.0)
Neutro Abs: 2.2 10*3/uL (ref 1.4–7.7)
Neutrophils Relative %: 43 % (ref 43.0–77.0)
Platelets: 363 10*3/uL (ref 150.0–400.0)
RBC: 4.22 Mil/uL (ref 4.22–5.81)
RDW: 12.7 % (ref 11.5–15.5)
WBC: 5 10*3/uL (ref 4.0–10.5)

## 2022-07-18 MED ORDER — AZITHROMYCIN 250 MG PO TABS: ORAL_TABLET | ORAL | 1 refills | Status: DC

## 2022-07-18 MED ORDER — TADALAFIL 10 MG PO TABS: 10.0000 mg | ORAL_TABLET | Freq: Every day | ORAL | 3 refills | Status: DC

## 2022-07-18 NOTE — Assessment & Plan Note (Signed)
Blood pressure is at goal today on current regimen per cardiology with Entresto and metoprolol succinate.

## 2022-07-18 NOTE — Patient Instructions (Signed)
It was very nice to see you today!  Will do another round of azithromycin.  Please let me know if not improving.  We will check blood work today.  It is okay for you to take an extra dose of Cialis as needed for erectile dysfunction.  Please let me know in a few weeks how you are doing.  Take care, Dr [name]  PLEASE NOTE:  If you had any lab tests please let us know if you have not heard back within a few days. You may see your results on mychart before we have a chance to review them but we will give you a call once they are reviewed by [place]. If we ordered any referrals today, please let us know if you have not heard from their office within the next week.   Please try these tips to maintain a healthy lifestyle:  Eat at least 3 REAL meals and 1-2 snacks per day.  Aim for no more than 5 hours between eating.  If you eat breakfast, please do so within one hour of getting up.   Each meal should contain half fruits/vegetables, one quarter protein, and one quarter carbs (no bigger than a computer mouse)  Cut down on sweet beverages. This includes juice, soda, and sweet tea.   Drink at least 1 glass of water with each meal and aim for at least 8 glasses per day  Exercise at least 150 minutes every week.

## 2022-07-18 NOTE — Assessment & Plan Note (Signed)
We started him on Cialis 10 mg daily at our last office visit.  He is doing well with this in terms of his BPH symptoms.  He still having some erectile dysfunction issues as below.  If this continues to be an issue would consider referral to urology.

## 2022-07-18 NOTE — Progress Notes (Signed)
   [name] is a 66 y.o. male who presents today for an office visit.  Assessment/Plan:  New/Acute Problems: Sinusitis  No red flags.  He has had some improvement with azithromycin though still has some evidence of residual sinusitis.  Given his history of bone marrow transplant x 2 concerns for atypical immunity we will continue his azithromycin for another 5 days.  It is reassuring that he has had improvement with the 5-day course we have given him already.  He does have upcoming appoint with ENT next month.  He will follow-up in a few weeks via MyChart if not improving.  We discussed reasons to return to care.  Chronic Problems Addressed Today: Seasonal allergies Follows with allergist.  Likely contributing to his sinusitis.  Essential hypertension, benign Blood pressure is at goal today on current regimen per cardiology with Entresto and metoprolol succinate.  BPH (benign prostatic hyperplasia) We started him on Cialis 10 mg daily at our last office visit.  He is doing well with this in terms of his BPH symptoms.  He still having some erectile dysfunction issues as below.  If this continues to be an issue would consider referral to urology.  Erectile dysfunction At our last visit we started him on Cialis 10 mg daily to help with his concurrent BPH symptoms.  Still having some issues with erectile dysfunction despite this.  We discussed changing to 20 mg daily versus an extra 10 mg daily as needed.  He would like to try 10 mg daily as needed in addition to his baseline 10 mg daily.  Think this is reasonable.  He will try this for a few weeks and then check with me via MyChart.  If this continues to be an issue would consider referral to urology.     Subjective:  HPI:  Patient here for sinus infection follow up.   Patient had virtual visit a week ago for recurrent sinusitis.  At that office visit he was having issues with rebound symptoms after previous courses of Augmentin.  We  started him on azithromycin.  His symptoms have significantly improved since then though still has occasional feeling of pressure and stuffiness in the front part of his forehead.  No reported fevers or chills.  See A/p for status of chronic conditions.       Objective:  Physical Exam: BP 122/70   Pulse 75   Temp (!) 97.1 F (36.2 C) (Temporal)   Ht '5\' 10"'$  (1.778 m)   Wt 169 lb (76.7 kg)   SpO2 98%   BMI 24.25 kg/m   Gen: No acute distress, resting comfortably HEENT: Bilateral tympanostomy tubes in place.  Decreased transillumination in maxillary and frontal sinuses. CV: Regular rate and rhythm with no murmurs appreciated Pulm: Normal work of breathing, clear to auscultation bilaterally with no crackles, wheezes, or rhonchi Neuro: Grossly normal, moves all extremities Psych: Normal affect and thought content      Keshona Kartes M. [name], MD 07/18/2022 9:16 AM

## 2022-07-18 NOTE — Assessment & Plan Note (Signed)
Follows with allergist.  Likely contributing to his sinusitis.

## 2022-07-18 NOTE — Assessment & Plan Note (Signed)
At our last visit we started him on Cialis 10 mg daily to help with his concurrent BPH symptoms.  Still having some issues with erectile dysfunction despite this.  We discussed changing to 20 mg daily versus an extra 10 mg daily as needed.  He would like to try 10 mg daily as needed in addition to his baseline 10 mg daily.  Think this is reasonable.  He will try this for a few weeks and then check with me via MyChart.  If this continues to be an issue would consider referral to urology.

## 2022-07-22 NOTE — Progress Notes (Signed)
Please inform patient of the following:  Blood counts are normal.

## 2022-08-29 ENCOUNTER — Telehealth: Payer: Medicare Other | Admitting: Physician Assistant

## 2022-10-01 ENCOUNTER — Other Ambulatory Visit: Payer: Self-pay | Admitting: Cardiovascular Disease

## 2022-10-06 NOTE — Progress Notes (Signed)
CARDIOLOGY CONSULT NOTE     Patient ID: [name] MRN: [id number] DOB/AGE: 68-Sep-1957 68 y.o.  Admit date: (Not on file) Referring Physician: [name] Primary Physician: [name], MD Primary Cardiologist: Johnsie Cancel Reason for Consultation: CAD    HPI: 67 y.o. referred by Dr [name] 10/12/20  for family history of CAD, HTN and chronic systolic CHF   History of AML with BMT in 2000.  Diagnosed with MDS in 2020 and had repeat transplant 06/08/19  Post 2nd tranplant had pulmonary edema ? From moderate MR not clear what EF was at that time although notes indicate patient concerned recently that EF had gone done from 50-55% and his HR has been running higher He has seen [facility] cardiology Babtist and there note indicates starting ACE 06/15/20 with plan to add beta blocker and stopped norvasc.    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 Has a son and daughter working with him All of them went to [employer]. Enjoys golf and has 3 grand kids.  Met his wife of over 49 years at [employer] as well  Cardiac CT 12/03/20 calcium score 120 58 th percentile CAD RADS one non obstructive CAD  He is having a lot of issues with recurrent allergies since his 2 nd transplant Has had recurrent ear infections and in general just feels miserable and anxious about it all Told him he should not stop his beta blocker as his allergist recommended as it is first line Rx for his DCM and if he needed an epi pen for allergic reaction it would still work as intended Started allergy shots and dose increased beginning of August with drop in BP needing epi  Allergist felt he might have had a vagal reaction Seen by our PA [id number] and systolic BP [id number] mmHg and GDMT not decreased   Some issues with donut hole with Entresto and Jardiance    Allergies much better Going to retire in June    ROS All other systems reviewed and negative except as noted above  Past Medical History:  Diagnosis Date   Acute myeloid leukemia in remission ([place]) 1999   Anemia    Arthritis    Depression    Diverticulosis of colon (without mention of hemorrhage)    Diverticulosis of colon (without mention of hemorrhage) 04/21/2011   Esophageal reflux    Family hx colonic polyps    Family hx colonic polyps 04/21/2011   Hiatal hernia    Hiatal hernia 04/21/2011   History of kidney stones    Hyperlipemia    Hypertension    MDS (myelodysplastic syndrome) ([place])    Psychosexual dysfunction with inhibited sexual excitement     Family History  Problem Relation Age of Onset   Hypertension Father    Heart disease Father    Prostate cancer Father        dx late 36s   Cancer Father 50       prostate ca   Celiac disease Mother    Colon polyps Mother    Breast cancer Mother    Cancer Mother  Bladder   Colon cancer Neg Hx    Esophageal cancer Neg Hx    Inflammatory bowel disease Neg Hx    Liver disease Neg Hx    Pancreatic cancer Neg Hx    Rectal cancer Neg Hx    Stomach cancer Neg Hx     Social History   Socioeconomic History   Marital status: Married    Spouse name: Not on file   Number of children: 2   Years of education: Not on file   Highest education level: Not on file  Occupational History   Occupation: self employed    Employer: [employer]  Tobacco Use   Smoking status: Never   Smokeless tobacco: Never  Vaping Use   Vaping Use: Never used  Substance and Sexual Activity   Alcohol use: Yes    Comment: one occ    Drug use: No   Sexual activity: Never  Other Topics Concern   Not on file  Social History Narrative   2 caffeine drinks  daily    Social Determinants of Health   Financial Resource Strain: Low Risk  (12/23/2021)   Overall Financial Resource Strain (CARDIA)    Difficulty of Paying Living Expenses: Not hard at all  Food Insecurity: No Food Insecurity (12/23/2021)   Hunger Vital Sign    Worried About Running Out of Food in the Last Year: Never true    Ran Out of Food in the Last Year: Never true  Transportation Needs: No Transportation Needs (12/23/2021)   PRAPARE - Hydrologist (Medical): No    Lack of Transportation (Non-Medical): No  Physical Activity: Sufficiently Active (12/23/2021)   Exercise Vital Sign    Days of Exercise per Week: 5 days    Minutes of Exercise per Session: 40 min  Stress: No Stress Concern Present (12/23/2021)   [place]    Feeling of Stress : Not at all  Social Connections: Scissors (12/23/2021)   Social Connection and Isolation Panel [NHANES]    Frequency of Communication with Friends and Family: More than three times a week    Frequency of Social Gatherings with Friends and Family: More than three times a week    Attends Religious Services: More than 4 times per year    Active Member of [employer] or Organizations: Yes    Attends Archivist Meetings: 1 to 4 times per year    Marital Status: Married  Human resources officer Violence: Not At Risk (12/23/2021)   Humiliation, Afraid, Rape, and Kick questionnaire    Fear of Current or Ex-Partner: No    Emotionally Abused: No    Physically Abused: No    Sexually Abused: No    Past Surgical History:  Procedure Laterality Date   EAR EXAMINATION UNDER ANESTHESIA Right 10/29/2021   middle ear cleaned out infection, with an inplanted ear tube   KNEE SURGERY Left 08/14/2021   [facility]   SHOULDER SURGERY     x 3       Current Outpatient Medications:    alfuzosin (UROXATRAL) 10 MG 24 hr tablet, Take 1 tablet by mouth daily.,  Disp: , Rfl:    Ascorbic Acid (VITAMIN C) 500 MG CAPS, Take 500 mg by mouth daily., Disp: , Rfl:    calcium-vitamin D (OSCAL WITH D) 500-200 MG-UNIT TABS tablet, Take by mouth., Disp: , Rfl:    cetirizine (ZYRTEC) 10 MG tablet, Take 10 mg by  mouth daily., Disp: , Rfl:    Cyanocobalamin (VITAMIN B12) 1000 MCG TBCR, 1 tablet, Disp: , Rfl:    empagliflozin (JARDIANCE) 10 MG TABS tablet, Take 1 tablet (10 mg total) by mouth daily., Disp: 90 tablet, Rfl: 2   ENTRESTO 97-103 MG, TAKE ONE TABLET BY MOUTH TWICE A DAY, Disp: 180 tablet, Rfl: 3   fluticasone (FLONASE) 50 MCG/ACT nasal spray, Place into both nostrils daily., Disp: , Rfl:    metoprolol succinate (TOPROL XL) 25 MG 24 hr tablet, Take 1 tablet (25 mg total) by mouth daily., Disp: 90 tablet, Rfl: 3   montelukast (SINGULAIR) 10 MG tablet, 1 TABLET ORALLY ONCE A DAY, AT BEDTIME 30 DAY(S), Disp: , Rfl:    Nutritional Supplements (JUICE PLUS FIBRE PO), Take by mouth. 2 tabs twice daily, Disp: , Rfl:    omeprazole (PRILOSEC) 20 MG capsule, Take 20 mg by mouth 2 (two) times daily before a meal., Disp: , Rfl:    pravastatin (PRAVACHOL) 20 MG tablet, Take 1 tablet (20 mg total) by mouth daily., Disp: 90 tablet, Rfl: 3   sertraline (ZOLOFT) 50 MG tablet, Take 1 tablet (50 mg total) by mouth daily., Disp: 90 tablet, Rfl: 3   sildenafil (VIAGRA) 100 MG tablet, Take 0.5 tablets (50 mg total) by mouth as needed for erectile dysfunction., Disp: 30 tablet, Rfl: 6   B Complex Vitamins (VITAMIN B COMPLEX) TABS, Take by mouth., Disp: , Rfl:    EPIPEN 2-PAK 0.3 MG/0.3ML DEVI, Inject 0.3 mg into the muscle as needed., Disp: , Rfl:     Physical Exam: Blood pressure 118/70, pulse 77, height '5\' 10"'$  (1.778 m), weight 177 lb (80.3 kg).   Affect appropriate Healthy:  appears stated age 89: normal Neck supple with no adenopathy JVP normal no bruits no thyromegaly Lungs clear with no wheezing and good diaphragmatic motion Heart:  S1/S2 no murmur, no rub, gallop  or click PMI normal Abdomen: benighn, BS positve, no tenderness, no AAA no bruit.  No HSM or HJR Distal pulses intact with no bruits No edema Neuro non-focal Bilateral knee arthritis     Labs:   Lab Results  Component Value Date   WBC 5.0 07/18/2022   HGB 13.4 07/18/2022   HCT 40.1 07/18/2022   MCV 95.2 07/18/2022   PLT 363.0 07/18/2022   No results for input(s): "NA", "K", "CL", "CO2", "BUN", "CREATININE", "CALCIUM", "PROT", "BILITOT", "ALKPHOS", "ALT", "AST", "GLUCOSE" in the last 168 hours.  Invalid input(s): "LABALBU" Lab Results  Component Value Date   CKTOTAL 210 03/18/2012   CKMB 3.9 03/18/2012   TROPONINI <0.30 03/18/2012    Lab Results  Component Value Date   CHOL 169 01/02/2022   CHOL 140 11/27/2020   CHOL 204 (H) 11/22/2019   Lab Results  Component Value Date   HDL 52.40 01/02/2022   HDL 44.40 11/27/2020   HDL 48.40 11/22/2019   Lab Results  Component Value Date   LDLCALC 101 (H) 01/02/2022   LDLCALC 76 11/27/2020   LDLCALC 130 (H) 11/22/2019   Lab Results  Component Value Date   TRIG 79.0 01/02/2022   TRIG 96.0 11/27/2020   TRIG 128.0 11/22/2019   Lab Results  Component Value Date   CHOLHDL 3 01/02/2022   CHOLHDL 3 11/27/2020   CHOLHDL 4 11/22/2019   Lab Results  Component Value Date   LDLDIRECT 146.2 07/29/2012   LDLDIRECT 136.9 05/12/2011   LDLDIRECT 134.5 01/29/2010      Radiology: No results found.   EKG:  2014 SR LAFB LVH  10/20/2022 NSR LAFB LVH no changes 10/20/2022 SR rate 77 RBBB/LAFB    ASSESSMENT AND PLAN:   1. Family History CAD:  average calcium score no obstructive dx on CT 12/03/20 2. HLD:  On statin LDL 101 on pravastatin 20 mg  3. AML/BMT:  Stable post transplant x 2 4. Chronic Systolic CHF: ? Related to Rx for cancer On optimal medical Rx now EF 36% by MRI 10/07/21  He has no volume overload what so ever and do not feel aldactone needed BP stable on current doses  5. Ortho:  bilateral knee issues f/u [facility] post  arthroscopic surgery on right and likely will have left done after golf season  6. Allergies:  with immunosuppression from BMT continue shots ? Vagal reaction August when dose increased has Epi pens prefer not to lower entresto dose for now  7. Bifasicular Block:  discussed higher risk of PPM in future ECG stable    F/U in  a year   Signed: Jenkins Rouge 10/20/2022, 8:44 AM

## 2022-10-20 ENCOUNTER — Encounter: Payer: Self-pay | Admitting: Cardiovascular Disease

## 2022-10-20 ENCOUNTER — Ambulatory Visit: Payer: Medicare Other | Attending: Cardiovascular Disease | Admitting: Cardiovascular Disease

## 2022-10-20 VITALS — BP 118/70 | HR 77 | Ht 70.0 in | Wt 177.0 lb

## 2022-10-20 DIAGNOSIS — I452 Bifascicular block: Secondary | ICD-10-CM | POA: Insufficient documentation

## 2022-10-20 DIAGNOSIS — I428 Other cardiomyopathies: Secondary | ICD-10-CM | POA: Diagnosis not present

## 2022-10-20 DIAGNOSIS — E782 Mixed hyperlipidemia: Secondary | ICD-10-CM | POA: Diagnosis not present

## 2022-10-20 NOTE — Patient Instructions (Signed)
Medication Instructions:  Your physician recommends that you continue on your current medications as directed. Please refer to the Current Medication list given to you today.  *If you need a refill on your cardiac medications before your next appointment, please call your pharmacy*  Lab Work: If you have labs (blood work) drawn today and your tests are completely normal, you will receive your results only by: [place] (if you have MyChart) OR A paper copy in the mail If you have any lab test that is abnormal or we need to change your treatment, we will call you to review the results.  Follow-Up: At [facility], you and your health needs are our priority.  As part of our continuing mission to provide you with exceptional heart care, we have created designated Provider Care Teams.  These Care Teams include your primary Cardiologist (physician) and Advanced Practice Providers (APPs -  Physician Assistants and Nurse Practitioners) who all work together to provide you with the care you need, when you need it.  We recommend signing up for the patient portal called "MyChart".  Sign up information is provided on this After Visit Summary.  MyChart is used to connect with patients for Virtual Visits (Telemedicine).  Patients are able to view lab/test results, encounter notes, upcoming appointments, etc.  Non-urgent messages can be sent to your provider as well.   To learn more about what you can do with MyChart, go to [email or web address].    Your next appointment:   1 year(s)  Provider:   Jenkins Rouge, MD

## 2022-10-23 ENCOUNTER — Encounter: Payer: Self-pay | Admitting: Family Medicine

## 2022-10-23 ENCOUNTER — Ambulatory Visit (INDEPENDENT_AMBULATORY_CARE_PROVIDER_SITE_OTHER): Payer: Medicare Other | Admitting: Family Medicine

## 2022-10-23 VITALS — BP 114/73 | HR 64 | Temp 97.5°F | Ht 70.0 in | Wt 173.4 lb

## 2022-10-23 DIAGNOSIS — J029 Acute pharyngitis, unspecified: Secondary | ICD-10-CM

## 2022-10-23 DIAGNOSIS — J329 Chronic sinusitis, unspecified: Secondary | ICD-10-CM

## 2022-10-23 DIAGNOSIS — K219 Gastro-esophageal reflux disease without esophagitis: Secondary | ICD-10-CM | POA: Diagnosis not present

## 2022-10-23 LAB — POCT RAPID STREP A (OFFICE): Rapid Strep A Screen: NEGATIVE

## 2022-10-23 MED ORDER — AMOXICILLIN-POT CLAVULANATE 875-125 MG PO TABS: 1.0000 | ORAL_TABLET | Freq: Two times a day (BID) | ORAL | 0 refills | Status: DC

## 2022-10-23 MED ORDER — PANTOPRAZOLE SODIUM 40 MG PO TBEC: 40.0000 mg | DELAYED_RELEASE_TABLET | Freq: Two times a day (BID) | ORAL | 3 refills | Status: DC

## 2022-10-23 NOTE — Assessment & Plan Note (Signed)
Has recently seen ENT for this.  Diagnosed with deviated septum.  He is not yet ready to pursue surgery for this.  Will give pocket prescription for Augmentin to have on hand for flareups.  He has previously done well with this.

## 2022-10-23 NOTE — Assessment & Plan Note (Signed)
He has had a flareup of reflux over the last few weeks.  Will start Protonix 40 mg twice daily for a few weeks.  He will let me know in a few weeks how he is doing.  If still not improving will need referral to GI.  If he does well with this we can continue for a full 4 to 6-week course before going back to his previous therapy.

## 2022-10-23 NOTE — Progress Notes (Signed)
   [name] is a 66 y.o. male who presents today for an office visit.  Assessment/Plan:  New/Acute Problems: Sore Throat No red flags.  Rapid strep negative.  No other signs and symptoms of viral URI or seasonal allergies.  Likely secondary to reflux they will be treating as below.  He will let us know if not improving.  Chronic Problems Addressed Today: GERD with hiatal hernia He has had a flareup of reflux over the last few weeks.  Will start Protonix 40 mg twice daily for a few weeks.  He will let me know in a few weeks how he is doing.  If still not improving will need referral to GI.  If he does well with this we can continue for a full 4 to 6-week course before going back to his previous therapy.  Recurrent sinusitis Has recently seen ENT for this.  Diagnosed with deviated septum.  He is not yet ready to pursue surgery for this.  Will give pocket prescription for Augmentin to have on hand for flareups.  He has previously done well with this.     Subjective:  HPI:  See A/p for status of chronic conditions.  His main concern today is sore throat. This started a few days ago. No rhinorrhea. No congestion. No fevers or chills.  He has also had worsening reflux for the last few weeks. Tried taking OTC prilosec and pepcid without much improvement.  He does have a history of a hiatal hernia and thinks it has gotten flared up.  No obvious precipitating events.       Objective:  Physical Exam: BP 114/73   Pulse 64   Temp (!) 97.5 F (36.4 C) (Temporal)   Ht 5\' 10"  (1.778 m)   Wt 173 lb 6.4 oz (78.7 kg)   SpO2 99%   BMI 24.88 kg/m   Gen: No acute distress, resting comfortably HEENT: OP clear. CV: Regular rate and rhythm with no murmurs appreciated Pulm: Normal work of breathing, clear to auscultation bilaterally with no crackles, wheezes, or rhonchi Neuro: Grossly normal, moves all extremities Psych: Normal affect and thought content      [name] M. [name],  MD 10/23/2022 11:32 AM

## 2022-10-23 NOTE — Patient Instructions (Signed)
It was very nice to see you today!  Your sore throat is probably coming from the reflux.  Your strep test is negative.  Please take the Protonix twice daily for a couple of weeks.  Sending message in a few weeks to let me know how you are doing.  Take care, Dr [name]  PLEASE NOTE:  If you had any lab tests, please let us know if you have not heard back within a few days. You may see your results on mychart before we have a chance to review them but we will give you a call once they are reviewed by [place].   If we ordered any referrals today, please let us know if you have not heard from their office within the next week.   If you had any urgent prescriptions sent in today, please check with the pharmacy within an hour of our visit to make sure the prescription was transmitted appropriately.   Please try these tips to maintain a healthy lifestyle:  Eat at least 3 REAL meals and 1-2 snacks per day.  Aim for no more than 5 hours between eating.  If you eat breakfast, please do so within one hour of getting up.   Each meal should contain half fruits/vegetables, one quarter protein, and one quarter carbs (no bigger than a computer mouse)  Cut down on sweet beverages. This includes juice, soda, and sweet tea.   Drink at least 1 glass of water with each meal and aim for at least 8 glasses per day  Exercise at least 150 minutes every week.

## 2022-10-25 ENCOUNTER — Other Ambulatory Visit: Payer: Self-pay | Admitting: Cardiovascular Disease

## 2022-10-27 ENCOUNTER — Other Ambulatory Visit: Payer: Self-pay | Admitting: Cardiovascular Disease

## 2022-10-27 DIAGNOSIS — I10 Essential (primary) hypertension: Secondary | ICD-10-CM

## 2022-10-27 MED ORDER — EMPAGLIFLOZIN 10 MG PO TABS: 10.0000 mg | ORAL_TABLET | Freq: Every day | ORAL | 3 refills | Status: DC

## 2022-11-20 ENCOUNTER — Encounter: Payer: Self-pay | Admitting: Family Medicine

## 2022-11-21 NOTE — Telephone Encounter (Signed)
If he is doing better, it is ok for him to stay off antibiotics. He should let us know if symptoms do not continue to improve.  Katina Degree. [name], MD 11/21/2022 7:59 AM

## 2022-11-25 ENCOUNTER — Encounter: Payer: Self-pay | Admitting: Family Medicine

## 2022-11-25 ENCOUNTER — Ambulatory Visit (INDEPENDENT_AMBULATORY_CARE_PROVIDER_SITE_OTHER): Payer: Medicare Other | Admitting: Family Medicine

## 2022-11-25 VITALS — BP 128/82 | HR 66 | Temp 97.7°F | Ht 70.0 in | Wt 174.4 lb

## 2022-11-25 DIAGNOSIS — J302 Other seasonal allergic rhinitis: Secondary | ICD-10-CM | POA: Diagnosis not present

## 2022-11-25 DIAGNOSIS — N529 Male erectile dysfunction, unspecified: Secondary | ICD-10-CM

## 2022-11-25 MED ORDER — AZITHROMYCIN 250 MG PO TABS: ORAL_TABLET | ORAL | 0 refills | Status: DC

## 2022-11-25 MED ORDER — VARDENAFIL HCL 20 MG PO TABS: 10.0000 mg | ORAL_TABLET | Freq: Every day | ORAL | 0 refills | Status: DC | PRN

## 2022-11-25 NOTE — Patient Instructions (Addendum)
It was very nice to see you today!  Please start the azithromycin. Let us know if not improving in 5-7 days.  Please try the levitra. Let me know how this is working in a few weeks.   Take care, Dr [name]  PLEASE NOTE:  If you had any lab tests, please let us know if you have not heard back within a few days. You may see your results on mychart before we have a chance to review them but we will give you a call once they are reviewed by [place].   If we ordered any referrals today, please let us know if you have not heard from their office within the next week.   If you had any urgent prescriptions sent in today, please check with the pharmacy within an hour of our visit to make sure the prescription was transmitted appropriately.   Please try these tips to maintain a healthy lifestyle:  Eat at least 3 REAL meals and 1-2 snacks per day.  Aim for no more than 5 hours between eating.  If you eat breakfast, please do so within one hour of getting up.   Each meal should contain half fruits/vegetables, one quarter protein, and one quarter carbs (no bigger than a computer mouse)  Cut down on sweet beverages. This includes juice, soda, and sweet tea.   Drink at least 1 glass of water with each meal and aim for at least 8 glasses per day  Exercise at least 150 minutes every week.

## 2022-11-25 NOTE — Progress Notes (Signed)
   [name] is a 67 y.o. male who presents today for an office visit.  Assessment/Plan:  New/Acute Problems: Sinusitis  No red flags.  Did not tolerate Augmentin.  Given length of symptoms, it would be reasonable to continue with antibiotic therapies at this point will start azithromycin.  He can continue over-the-counter meds as well.  Encouraged hydration.  He will let me know if not improving in the next week or so.  May consider trial of levaquin if not improving.  Chronic Problems Addressed Today: Seasonal allergies Following with allergist.  Symptoms are improving.  Likely contributed to his recent sinus infection.  Erectile dysfunction Did not feel like the area was as effective as he would like.  He did try taking a full dose which did modestly help however was unable to maintain erection throughout intercourse.  We discussed alternative treatment options.  Cialis was not effective for him either.  Will try Levitra 10 mg daily as needed.  Discussed with patient he can increase to 20 mg daily if he feels like he needs a stronger dose.  He is aware of potential side effects.  He will let me know if not improving and would consider referral to urology at that point.     Subjective:  HPI:  Patient here with concern for sinus infection. Symptoms started about 2 weeks ago. Symptoms initially included runny nose, cough, and congestion. Over the last couple of weeks has started developing worsening facial pressure and congestion.  Tried OTc medications without much improvement. No fevers or chills. HE did start augmentin but had to discontinue due to GI side effects. Feels similar to previous sinus infections.  See A/p for status of chronic conditions.        Objective:  Physical Exam: BP 128/82   Pulse 66   Temp 97.7 F (36.5 C) (Temporal)   Ht 5\' 10"  (1.778 m)   Wt 174 lb 6.4 oz (79.1 kg)   SpO2 97%   BMI 25.02 kg/m   Gen: No acute distress, resting comfortably HEENT:  tympanostomy tubes in place bilaterally.  TMs clear otherwise.   CV: Regular rate and rhythm with no murmurs appreciated Pulm: Normal work of breathing, clear to auscultation bilaterally with no crackles, wheezes, or rhonchi Neuro: Grossly normal, moves all extremities.  Patellar reflexes 2+ and symmetric bilaterally. Psych: Normal affect and thought content      [name] M. [name], MD 11/25/2022 7:51 AM

## 2022-11-25 NOTE — Assessment & Plan Note (Signed)
Following with allergist.  Symptoms are improving.  Likely contributed to his recent sinus infection.

## 2022-11-25 NOTE — Assessment & Plan Note (Signed)
Did not feel like the area was as effective as he would like.  He did try taking a full dose which did modestly help however was unable to maintain erection throughout intercourse.  We discussed alternative treatment options.  Cialis was not effective for him either.  Will try Levitra 10 mg daily as needed.  Discussed with patient he can increase to 20 mg daily if he feels like he needs a stronger dose.  He is aware of potential side effects.  He will let me know if not improving and would consider referral to urology at that point.

## 2022-12-07 ENCOUNTER — Other Ambulatory Visit: Payer: Self-pay | Admitting: Cardiovascular Disease

## 2022-12-15 ENCOUNTER — Telehealth: Payer: Self-pay | Admitting: Family Medicine

## 2022-12-15 NOTE — Telephone Encounter (Signed)
Contacted [name] to schedule their annual wellness visit. Appointment made for 01/06/2023.  [name] [facility] AWV TEAM Direct Dial [phone]

## 2022-12-15 NOTE — Telephone Encounter (Signed)
Copied from CRM [id number]. Topic: Medicare AWV >> Dec 15, 2022 11:16 AM Gwenith Spitz wrote: Reason for CRM: Called patient to reschedule Medicare Annual Wellness Visit (AWV). Left message for patient to call back and reschedule Medicare Annual Wellness Visit (AWV).  Last date of AWV: 12/23/2021  Please reschedule an appointment at any time with Inetta Fermo, [facility]. Please reschedule AWVS with Inetta Fermo, [facility].  If any questions, please contact me at [phone].  Thank you ,  Gabriel Cirri [facility] AWV TEAM Direct Dial [phone]

## 2023-01-20 ENCOUNTER — Ambulatory Visit (INDEPENDENT_AMBULATORY_CARE_PROVIDER_SITE_OTHER): Payer: Medicare Other

## 2023-01-20 VITALS — Wt 174.0 lb

## 2023-01-20 DIAGNOSIS — Z0000 Encounter for general adult medical examination without abnormal findings: Secondary | ICD-10-CM

## 2023-01-20 NOTE — Patient Instructions (Signed)
[name] , Thank you for taking time to come for your Medicare Wellness Visit. I appreciate your ongoing commitment to your health goals. Please review the following plan we discussed and let me know if I can assist you in the future.   These are the goals we discussed:  Goals      Patient Stated     Maintain good sugar levels and be active as can      Patient Stated     Continue to be active         This is a list of the screening recommended for you and due dates:  Health Maintenance  Topic Date Due   Flu Shot  03/12/2023   COVID-19 Vaccine (9 - 2023-24 season) 03/16/2023   Medicare Annual Wellness Visit  01/20/2024   DEXA scan (bone density measurement)  07/01/2024   DTaP/Tdap/Td vaccine (7 - Td or Tdap) 04/30/2030   Colon Cancer Screening  10/02/2031   Pneumonia Vaccine  Completed   Hepatitis C Screening  Completed   Zoster (Shingles) Vaccine  Completed   HPV Vaccine  Aged Out    Advanced directives: Advance directive discussed with you today. Even though you declined this today please call our office should you change your mind and we can give you the proper paperwork for you to fill out.  Conditions/risks identified: stay active and healthy   Next appointment: Follow up in one year for your annual wellness visit.   Preventive Care 29 Years and Older, Male  Preventive care refers to lifestyle choices and visits with your health care provider that can promote health and wellness. What does preventive care include? A yearly physical exam. This is also called an annual well check. Dental exams once or twice a year. Routine eye exams. Ask your health care provider how often you should have your eyes checked. Personal lifestyle choices, including: Daily care of your teeth and gums. Regular physical activity. Eating a healthy diet. Avoiding tobacco and drug use. Limiting alcohol use. Practicing safe sex. Taking low doses of aspirin every day. Taking vitamin and  mineral supplements as recommended by your health care provider. What happens during an annual well check? The services and screenings done by your health care provider during your annual well check will depend on your age, overall health, lifestyle risk factors, and family history of disease. Counseling  Your health care provider may ask you questions about your: Alcohol use. Tobacco use. Drug use. Emotional well-being. Home and relationship well-being. Sexual activity. Eating habits. History of falls. Memory and ability to understand (cognition). Work and work Astronomer. Screening  You may have the following tests or measurements: Height, weight, and BMI. Blood pressure. Lipid and cholesterol levels. These may be checked every 5 years, or more frequently if you are over 89 years old. Skin check. Lung cancer screening. You may have this screening every year starting at age 60 if you have a 30-pack-year history of smoking and currently smoke or have quit within the past 15 years. Fecal occult blood test (FOBT) of the stool. You may have this test every year starting at age 13. Flexible sigmoidoscopy or colonoscopy. You may have a sigmoidoscopy every 5 years or a colonoscopy every 10 years starting at age 18. Prostate cancer screening. Recommendations will vary depending on your family history and other risks. Hepatitis C blood test. Hepatitis B blood test. Sexually transmitted disease (STD) testing. Diabetes screening. This is done by checking your blood sugar (glucose) after  you have not eaten for a while (fasting). You may have this done every 1-3 years. Abdominal aortic aneurysm (AAA) screening. You may need this if you are a current or former smoker. Osteoporosis. You may be screened starting at age 27 if you are at high risk. Talk with your health care provider about your test results, treatment options, and if necessary, the need for more tests. Vaccines  Your health care  provider may recommend certain vaccines, such as: Influenza vaccine. This is recommended every year. Tetanus, diphtheria, and acellular pertussis (Tdap, Td) vaccine. You may need a Td booster every 10 years. Zoster vaccine. You may need this after age 76. Pneumococcal 13-valent conjugate (PCV13) vaccine. One dose is recommended after age 16. Pneumococcal polysaccharide (PPSV23) vaccine. One dose is recommended after age 33. Talk to your health care provider about which screenings and vaccines you need and how often you need them. This information is not intended to replace advice given to you by your health care provider. Make sure you discuss any questions you have with your health care provider. Document Released: 08/24/2015 Document Revised: 04/16/2016 Document Reviewed: 05/29/2015 Elsevier Interactive Patient Education  2017 [employer].  Fall Prevention in the Home Falls can cause injuries. They can happen to people of all ages. There are many things you can do to make your home safe and to help prevent falls. What can I do on the outside of my home? Regularly fix the edges of walkways and driveways and fix any cracks. Remove anything that might make you trip as you walk through a door, such as a raised step or threshold. Trim any bushes or trees on the path to your home. Use bright outdoor lighting. Clear any walking paths of anything that might make someone trip, such as rocks or tools. Regularly check to see if handrails are loose or broken. Make sure that both sides of any steps have handrails. Any raised decks and porches should have guardrails on the edges. Have any leaves, snow, or ice cleared regularly. Use sand or salt on walking paths during winter. Clean up any spills in your garage right away. This includes oil or grease spills. What can I do in the bathroom? Use night lights. Install grab bars by the toilet and in the tub and shower. Do not use towel bars as grab  bars. Use non-skid mats or decals in the tub or shower. If you need to sit down in the shower, use a plastic, non-slip stool. Keep the floor dry. Clean up any water that spills on the floor as soon as it happens. Remove soap buildup in the tub or shower regularly. Attach bath mats securely with double-sided non-slip rug tape. Do not have throw rugs and other things on the floor that can make you trip. What can I do in the bedroom? Use night lights. Make sure that you have a light by your bed that is easy to reach. Do not use any sheets or blankets that are too big for your bed. They should not hang down onto the floor. Have a firm chair that has side arms. You can use this for support while you get dressed. Do not have throw rugs and other things on the floor that can make you trip. What can I do in the kitchen? Clean up any spills right away. Avoid walking on wet floors. Keep items that you use a lot in easy-to-reach places. If you need to reach something above you, use a strong  step stool that has a grab bar. Keep electrical cords out of the way. Do not use floor polish or wax that makes floors slippery. If you must use wax, use non-skid floor wax. Do not have throw rugs and other things on the floor that can make you trip. What can I do with my stairs? Do not leave any items on the stairs. Make sure that there are handrails on both sides of the stairs and use them. Fix handrails that are broken or loose. Make sure that handrails are as long as the stairways. Check any carpeting to make sure that it is firmly attached to the stairs. Fix any carpet that is loose or worn. Avoid having throw rugs at the top or bottom of the stairs. If you do have throw rugs, attach them to the floor with carpet tape. Make sure that you have a light switch at the top of the stairs and the bottom of the stairs. If you do not have them, ask someone to add them for you. What else can I do to help prevent  falls? Wear shoes that: Do not have high heels. Have rubber bottoms. Are comfortable and fit you well. Are closed at the toe. Do not wear sandals. If you use a stepladder: Make sure that it is fully opened. Do not climb a closed stepladder. Make sure that both sides of the stepladder are locked into place. Ask someone to hold it for you, if possible. Clearly mark and make sure that you can see: Any grab bars or handrails. First and last steps. Where the edge of each step is. Use tools that help you move around (mobility aids) if they are needed. These include: Canes. Walkers. Scooters. Crutches. Turn on the lights when you go into a dark area. Replace any light bulbs as soon as they burn out. Set up your furniture so you have a clear path. Avoid moving your furniture around. If any of your floors are uneven, fix them. If there are any pets around you, be aware of where they are. Review your medicines with your doctor. Some medicines can make you feel dizzy. This can increase your chance of falling. Ask your doctor what other things that you can do to help prevent falls. This information is not intended to replace advice given to you by your health care provider. Make sure you discuss any questions you have with your health care provider. Document Released: 05/24/2009 Document Revised: 01/03/2016 Document Reviewed: 09/01/2014 Elsevier Interactive Patient Education  2017 [employer].

## 2023-01-20 NOTE — Progress Notes (Signed)
I connected with  REMI ENLOE on 01/20/23 by a audio enabled telemedicine application and verified that I am speaking with the correct person using two identifiers.  Patient Location: Home  Provider Location: Office/Clinic  I discussed the limitations of evaluation and management by telemedicine. The patient expressed understanding and agreed to proceed.   Subjective:   [name] is a 66 y.o. male who presents for Medicare Annual/Subsequent preventive examination.  Review of Systems     Cardiac Risk Factors include: advanced age (>68men, >34 women);hypertension;dyslipidemia;male gender     Objective:    Today's Vitals   01/20/23 1402  Weight: 174 lb (78.9 kg)   Body mass index is 24.97 kg/m.     12/23/2021    2:30 PM 03/17/2012    3:34 PM  Advanced Directives  Does Patient Have a Medical Advance Directive? Yes Patient has advance directive, copy not in chart  Type of Advance Directive Healthcare Power of Attorney Living will;Healthcare Power of Attorney  Copy of Healthcare Power of Attorney in Chart? No - copy requested Copy requested from family  Pre-existing out of facility DNR order (yellow form or pink MOST form)  No    Current Medications (verified) Outpatient Encounter Medications as of 01/20/2023  Medication Sig   Ascorbic Acid (VITAMIN C) 500 MG CAPS Take 500 mg by mouth daily.   B Complex Vitamins (VITAMIN B COMPLEX) TABS Take by mouth.   calcium-vitamin D (OSCAL WITH D) 500-200 MG-UNIT TABS tablet Take by mouth.   cetirizine (ZYRTEC) 10 MG tablet Take 10 mg by mouth daily.   Cyanocobalamin (VITAMIN B12) 1000 MCG TBCR 1 tablet   empagliflozin (JARDIANCE) 10 MG TABS tablet Take 1 tablet (10 mg total) by mouth daily.   ENTRESTO 97-103 MG TAKE ONE TABLET BY MOUTH TWICE A DAY   EPIPEN 2-PAK 0.3 MG/0.3ML DEVI Inject 0.3 mg into the muscle as needed.   fluticasone (FLONASE) 50 MCG/ACT nasal spray Place into both nostrils daily.   meloxicam (MOBIC) 15 MG  tablet Take 15 mg by mouth as needed.   metoprolol succinate (TOPROL-XL) 25 MG 24 hr tablet TAKE ONE TABLET BY MOUTH DAILY   montelukast (SINGULAIR) 10 MG tablet as needed.   Nutritional Supplements (JUICE PLUS FIBRE PO) Take by mouth. 2 tabs twice daily   omeprazole (PRILOSEC) 20 MG capsule Take 20 mg by mouth 2 (two) times daily before a meal.   pantoprazole (PROTONIX) 40 MG tablet Take 1 tablet (40 mg total) by mouth 2 (two) times daily.   pravastatin (PRAVACHOL) 20 MG tablet TAKE 1 TABLET BY MOUTH DAILY   sertraline (ZOLOFT) 50 MG tablet Take 1 tablet (50 mg total) by mouth daily.   valACYclovir HCl (VALTREX PO) Take by mouth as needed.   [DISCONTINUED] alfuzosin (UROXATRAL) 10 MG 24 hr tablet Take 1 tablet by mouth daily.   [DISCONTINUED] azithromycin (ZITHROMAX) 250 MG tablet Take 2 tabs day 1, then 1 tab daily   [DISCONTINUED] vardenafil (LEVITRA) 20 MG tablet Take 0.5-1 tablets (10-20 mg total) by mouth daily as needed for erectile dysfunction.   No facility-administered encounter medications on file as of 01/20/2023.    Allergies (verified) Morphine and Marinol [dronabinol]   History: Past Medical History:  Diagnosis Date   Acute myeloid leukemia in remission (HCC) 1999   Anemia    Arthritis    Depression    Diverticulosis of colon (without mention of hemorrhage)    Diverticulosis of colon (without mention of hemorrhage) 04/21/2011  Esophageal reflux    Family hx colonic polyps    Family hx colonic polyps 04/21/2011   Hiatal hernia    Hiatal hernia 04/21/2011   History of kidney stones    Hyperlipemia    Hypertension    MDS (myelodysplastic syndrome) (HCC)    Psychosexual dysfunction with inhibited sexual excitement    Past Surgical History:  Procedure Laterality Date   EAR EXAMINATION UNDER ANESTHESIA Right 10/29/2021   middle ear cleaned out infection, with an inplanted ear tube   KNEE SURGERY Left 08/14/2021   [facility]   SHOULDER SURGERY     x 3    Family  History  Problem Relation Age of Onset   Hypertension Father    Heart disease Father    Prostate cancer Father        dx late 84s   Cancer Father 4       prostate ca   Celiac disease Mother    Colon polyps Mother    Breast cancer Mother    Cancer Mother        Bladder   Colon cancer Neg Hx    Esophageal cancer Neg Hx    Inflammatory bowel disease Neg Hx    Liver disease Neg Hx    Pancreatic cancer Neg Hx    Rectal cancer Neg Hx    Stomach cancer Neg Hx    Social History   Socioeconomic History   Marital status: Married    Spouse name: Not on file   Number of children: 2   Years of education: Not on file   Highest education level: Not on file  Occupational History   Occupation: self employed    Employer: [employer]  Tobacco Use   Smoking status: Never   Smokeless tobacco: Never  Vaping Use   Vaping Use: Never used  Substance and Sexual Activity   Alcohol use: Yes    Comment: one occ    Drug use: No   Sexual activity: Never  Other Topics Concern   Not on file  Social History Narrative   2 caffeine drinks daily    Social Determinants of Health   Financial Resource Strain: Low Risk  (01/20/2023)   Overall Financial Resource Strain (CARDIA)    Difficulty of Paying Living Expenses: Not hard at all  Food Insecurity: No Food Insecurity (01/20/2023)   Hunger Vital Sign    Worried About Running Out of Food in the Last Year: Never true    Ran Out of Food in the Last Year: Never true  Transportation Needs: No Transportation Needs (01/20/2023)   PRAPARE - Administrator, Civil Service (Medical): No    Lack of Transportation (Non-Medical): No  Physical Activity: Sufficiently Active (01/20/2023)   Exercise Vital Sign    Days of Exercise per Week: 7 days    Minutes of Exercise per Session: 40 min  Stress: No Stress Concern Present (01/20/2023)   [name] - Occupational Stress Questionnaire    Feeling of Stress : Not at all   Social Connections: Socially Integrated (01/20/2023)   Social Connection and Isolation Panel [NHANES]    Frequency of Communication with Friends and Family: More than three times a week    Frequency of Social Gatherings with Friends and Family: More than three times a week    Attends Religious Services: More than 4 times per year    Active Member of [employer] or Organizations: Yes    Attends [employer]  or Organization Meetings: 1 to 4 times per year    Marital Status: Married    Tobacco Counseling Counseling given: Not Answered   Clinical Intake:  Pre-visit preparation completed: Yes  Pain : No/denies pain     BMI - recorded: 24.97 Nutritional Status: BMI of 19-24  Normal Nutritional Risks: None Diabetes: No  How often do you need to have someone help you when you read instructions, pamphlets, or other written materials from your doctor or pharmacy?: 1 - Never  Diabetic?no  Interpreter Needed?: No  Information entered by :: Lanier Ensign, LPN   Activities of Daily Living    01/20/2023    2:11 PM  In your present state of health, do you have any difficulty performing the following activities:  Hearing? 0  Vision? 0  Difficulty concentrating or making decisions? 0  Walking or climbing stairs? 0  Dressing or bathing? 0  Doing errands, shopping? 0  Preparing Food and eating ? N  Using the Toilet? N  In the past six months, have you accidently leaked urine? N  Do you have problems with loss of bowel control? N  Managing your Medications? N  Managing your Finances? N  Housekeeping or managing your Housekeeping? N    Patient Care Team: Ardith Dark, MD as PCP - General (Family Medicine) Wendall Stade, MD as PCP - Cardiology (Cardiology) Dorothy Spark, MD as Consulting Physician (Internal Medicine)  Indicate any recent Medical Services you may have received from other than [facility] providers in the past year (date may be approximate).     Assessment:   This is a  routine wellness examination for Pierz.  Hearing/Vision screen Hearing Screening - Comments:: Pt denies any hearing issues  Vision Screening - Comments:: Pt follows up with Dr Hyacinth Meeker for annual eye exams   Dietary issues and exercise activities discussed: Current Exercise Habits: Home exercise routine, Type of exercise: walking;Other - see comments;stretching, Time (Minutes): 40, Frequency (Times/Week): 7, Weekly Exercise (Minutes/Week): 280   Goals Addressed             This Visit's Progress    Patient Stated       Continue to be active        Depression Screen    01/20/2023    2:08 PM 11/25/2022    7:28 AM 10/23/2022   11:06 AM 07/18/2022    8:28 AM 07/11/2022    8:42 AM 05/08/2022   11:41 AM 01/02/2022    7:57 AM  PHQ 2/9 Scores  PHQ - 2 Score 0 0 0 0 0 0 0    Fall Risk    01/20/2023    2:10 PM 11/25/2022    7:28 AM 10/23/2022   11:06 AM 07/18/2022    8:28 AM 07/11/2022    8:42 AM  Fall Risk   Falls in the past year? 0 0 0 0 0  Number falls in past yr: 0 0 0 0 0  Injury with Fall? 0 0 0 0 0  Risk for fall due to : Impaired vision No Fall Risks No Fall Risks No Fall Risks No Fall Risks  Follow up Falls prevention discussed        FALL RISK PREVENTION PERTAINING TO THE HOME:  Any stairs in or around the home? Yes  If so, are there any without handrails? No  Home free of loose throw rugs in walkways, pet beds, electrical cords, etc? Yes  Adequate lighting in your home to reduce risk  of falls? Yes   ASSISTIVE DEVICES UTILIZED TO PREVENT FALLS:  Life alert? No  Use of a cane, walker or w/c? No  Grab bars in the bathroom? Yes  Shower chair or bench in shower? Yes  Elevated toilet seat or a handicapped toilet? Yes   TIMED UP AND GO:  Was the test performed? No .   Cognitive Function:        01/20/2023    2:12 PM 12/23/2021    2:33 PM  6CIT Screen  What Year? 0 points 0 points  What month? 0 points 0 points  What time? 0 points 0 points  Count back from  20 0 points 0 points  Months in reverse 0 points 0 points  Repeat phrase 0 points 0 points  Total Score 0 points 0 points    Immunizations Immunization History  Administered Date(s) Administered   COVID-19, mRNA, vaccine(Comirnaty)12 years and older 05/29/2022   DTaP / IPV 12/05/2019, 02/06/2020, 04/30/2020   Fluad Quad(high Dose 65+) 05/13/2022   HIB (PRP-OMP) 12/05/2019, 02/06/2020, 04/30/2020   HIB (PRP-T) 12/05/2019, 02/06/2020, 04/30/2020   Hepatitis B, PED/ADOLESCENT 12/05/2019, 02/06/2020, 08/13/2020   Influenza Split 06/11/2006, 06/01/2007, 05/18/2008, 07/02/2009, 05/19/2011, 05/24/2012, 05/03/2013, 04/25/2014, 05/26/2015, 06/06/2017, 05/23/2018   Influenza Whole 06/11/2006, 06/01/2007, 05/18/2008, 07/02/2009   Influenza, High Dose Seasonal PF 07/07/2017, 07/13/2018, 10/31/2019, 04/30/2020, 06/26/2020, 11/30/2020, 06/18/2021, 09/13/2021   Influenza,inj,Quad PF,6+ Mos 05/03/2013, 04/25/2014, 05/23/2018   Influenza-Unspecified 05/19/2011, 05/24/2012, 05/03/2013, 04/25/2014, 05/26/2015, 06/06/2017, 05/23/2018   MMR 01/02/2022, 08/14/2022   PFIZER(Purple Top)SARS-COV-2 Vaccination 10/20/2019, 11/10/2019, 01/23/2020, 03/27/2020, 08/29/2020, 06/18/2021   Pfizer Covid-19 Vaccine Bivalent Booster 5y-11y 01/19/2023   Pneumococcal Conjugate-13 12/05/2019, 02/06/2020, 04/30/2020   Pneumococcal Polysaccharide-23 05/18/2008, 06/26/2020, 08/13/2020, 11/30/2020, 06/18/2021, 09/13/2021   Td 12/04/2000   Td (Adult),5 Lf Tetanus Toxid, Preservative Free 12/04/2000   Tdap 05/19/2011   Zoster Recombinat (Shingrix) 10/27/2018, 08/17/2020   Zoster, Live 10/27/2018   Zoster, Unspecified 10/27/2018, 08/17/2020    TDAP status: Up to date  Flu Vaccine status: Up to date  Pneumococcal vaccine status: Up to date  Covid-19 vaccine status: Completed vaccines  Qualifies for Shingles Vaccine? Yes   Zostavax completed Yes   Shingrix Completed?: Yes  Screening Tests Health Maintenance  Topic  Date Due   INFLUENZA VACCINE  03/12/2023   COVID-19 Vaccine (9 - 2023-24 season) 03/16/2023   Medicare Annual Wellness (AWV)  01/20/2024   DEXA SCAN  07/01/2024   DTaP/Tdap/Td (7 - Td or Tdap) 04/30/2030   Colonoscopy  10/02/2031   Pneumonia Vaccine 23+ Years old  Completed   Hepatitis C Screening  Completed   Zoster Vaccines- Shingrix  Completed   HPV VACCINES  Aged Out    Health Maintenance  There are no preventive care reminders to display for this patient.   Colorectal cancer screening: Type of screening: Colonoscopy. Completed 10/01/21. Repeat every 10 years  Additional Screening:  Hepatitis C Screening:  Completed 10/21/17  Vision Screening: Recommended annual ophthalmology exams for early detection of glaucoma and other disorders of the eye. Is the patient up to date with their annual eye exam?  Yes  Who is the provider or what is the name of the office in which the patient attends annual eye exams? Dr Hyacinth Meeker  If pt is not established with a provider, would they like to be referred to a provider to establish care? No .   Dental Screening: Recommended annual dental exams for proper oral hygiene  Community Resource Referral / Chronic Care Management: CRR required this visit?  No   CCM required this visit?  No      Plan:     I have personally reviewed and noted the following in the patient's chart:   Medical and social history Use of alcohol, tobacco or illicit drugs  Current medications and supplements including opioid prescriptions. Patient is not currently taking opioid prescriptions. Functional ability and status Nutritional status Physical activity Advanced directives List of other physicians Hospitalizations, surgeries, and ER visits in previous 12 months Vitals Screenings to include cognitive, depression, and falls Referrals and appointments  In addition, I have reviewed and discussed with patient certain preventive protocols, quality metrics, and best  practice recommendations. A written personalized care plan for preventive services as well as general preventive health recommendations were provided to patient.     Marzella Schlein, LPN   1/61/0960   Nurse Notes: Pt declined further AWV and want to be removed from call list,stating excessive calls to remind him of appt and his doctors go over same information.

## 2023-03-09 ENCOUNTER — Other Ambulatory Visit: Payer: Self-pay | Admitting: Family Medicine

## 2023-03-31 ENCOUNTER — Other Ambulatory Visit: Payer: Self-pay | Admitting: Family Medicine

## 2023-05-04 ENCOUNTER — Encounter: Payer: Self-pay | Admitting: Family Medicine

## 2023-05-06 ENCOUNTER — Ambulatory Visit (INDEPENDENT_AMBULATORY_CARE_PROVIDER_SITE_OTHER): Payer: Medicare Other | Admitting: Family Medicine

## 2023-05-06 ENCOUNTER — Encounter: Payer: Self-pay | Admitting: Family Medicine

## 2023-05-06 VITALS — BP 118/77 | HR 71 | Temp 98.2°F | Ht 70.0 in | Wt 174.2 lb

## 2023-05-06 DIAGNOSIS — J302 Other seasonal allergic rhinitis: Secondary | ICD-10-CM

## 2023-05-06 DIAGNOSIS — R059 Cough, unspecified: Secondary | ICD-10-CM | POA: Diagnosis not present

## 2023-05-06 DIAGNOSIS — I10 Essential (primary) hypertension: Secondary | ICD-10-CM

## 2023-05-06 DIAGNOSIS — J329 Chronic sinusitis, unspecified: Secondary | ICD-10-CM

## 2023-05-06 LAB — POC COVID19 BINAXNOW: SARS Coronavirus 2 Ag: NEGATIVE

## 2023-05-06 MED ORDER — AZITHROMYCIN 250 MG PO TABS: ORAL_TABLET | ORAL | 0 refills | Status: DC

## 2023-05-06 MED ORDER — CETIRIZINE HCL 10 MG PO TABS: 10.0000 mg | ORAL_TABLET | Freq: Every day | ORAL | 3 refills | Status: DC

## 2023-05-06 MED ORDER — BENZONATATE 200 MG PO CAPS: 200.0000 mg | ORAL_CAPSULE | Freq: Two times a day (BID) | ORAL | 0 refills | Status: DC | PRN

## 2023-05-06 NOTE — Progress Notes (Signed)
   [name] is a 67 y.o. male who presents today for an office visit.  Assessment/Plan:  New/Acute Problems: Sinusitis  No red flags.  COVID test negative.  Due to prior history and personal history of AML status post bone marrow transplant would be reasonable to empirically treat for bacterial sinusitis given his recent viral infection.  Did not tolerate Augmentin well in the past.  Will start azithromycin.  Also refilled his Tessalon today.  Encouraged hydration.  He can use other over-the-counter meds as needed as well.  We discussed reasons to return to care.  Follow-up as needed.  Chronic Problems Addressed Today: Essential hypertension, benign Blood pressure at goal today on current regimen per cardiology with Entresto 97-103 twice daily and metoprolol succinate 25 mg daily.  Seasonal allergies Following with allergist.  Likely contributing to above.  Will refill his cetirizine today.     Subjective:  HPI:  See Assessment / plan for status of chronic conditions. Patient here today with cough and congestion. This has been going on for a several days. Initially started as a "tickle" in his throat and mild cough but over the last several days is having some chest congestion and nasal congestion.  Tried taking tessalon and guaifenesin with some improvement. No fevers or chills.        Objective:  Physical Exam: BP 118/77   Pulse 71   Temp 98.2 F (36.8 C) (Temporal)   Ht 5\' 10"  (1.778 m)   Wt 174 lb 3.2 oz (79 kg)   SpO2 98%   BMI 25.00 kg/m   Gen: No acute distress, resting comfortably HEENT: TM and ostomy tubes in place bilaterally. CV: Regular rate and rhythm with no murmurs appreciated Pulm: Normal work of breathing, clear to auscultation bilaterally with no crackles, wheezes, or rhonchi Neuro: Grossly normal, moves all extremities Psych: Normal affect and thought content      [name] M. [name], MD 05/06/2023 8:15 AM

## 2023-05-06 NOTE — Assessment & Plan Note (Signed)
Following with allergist.  Likely contributing to above.  Will refill his cetirizine today.

## 2023-05-06 NOTE — Assessment & Plan Note (Signed)
Blood pressure at goal today on current regimen per cardiology with Entresto 97-103 twice daily and metoprolol succinate 25 mg daily.

## 2023-05-10 ENCOUNTER — Encounter: Payer: Self-pay | Admitting: Family Medicine

## 2023-05-11 MED ORDER — AZITHROMYCIN 250 MG PO TABS: ORAL_TABLET | ORAL | 0 refills | Status: DC

## 2023-05-11 MED ORDER — BENZONATATE 200 MG PO CAPS: 200.0000 mg | ORAL_CAPSULE | Freq: Two times a day (BID) | ORAL | 0 refills | Status: DC | PRN

## 2023-06-02 ENCOUNTER — Ambulatory Visit: Payer: Medicare Other | Admitting: Family Medicine

## 2023-06-02 ENCOUNTER — Encounter: Payer: Self-pay | Admitting: Family Medicine

## 2023-06-02 VITALS — BP 135/86 | HR 60 | Temp 97.7°F | Ht 70.0 in | Wt 174.4 lb

## 2023-06-02 DIAGNOSIS — R059 Cough, unspecified: Secondary | ICD-10-CM | POA: Diagnosis not present

## 2023-06-02 DIAGNOSIS — K219 Gastro-esophageal reflux disease without esophagitis: Secondary | ICD-10-CM | POA: Diagnosis not present

## 2023-06-02 DIAGNOSIS — I10 Essential (primary) hypertension: Secondary | ICD-10-CM

## 2023-06-02 NOTE — Patient Instructions (Signed)
It was very nice to see you today!  Please try taking Pepcid at night.  You can continue with the omeprazole.  Let me know in a week or 2 how you are doing.  If you are still having a cough we will need to get a CT scan.  I will refer you to see the gastroenterologist.  Return if symptoms worsen or fail to improve.   Take care, Dr [name]  PLEASE NOTE:  If you had any lab tests, please let us know if you have not heard back within a few days. You may see your results on mychart before we have a chance to review them but we will give you a call once they are reviewed by [place].   If we ordered any referrals today, please let us know if you have not heard from their office within the next week.   If you had any urgent prescriptions sent in today, please check with the pharmacy within an hour of our visit to make sure the prescription was transmitted appropriately.   Please try these tips to maintain a healthy lifestyle:  Eat at least 3 REAL meals and 1-2 snacks per day.  Aim for no more than 5 hours between eating.  If you eat breakfast, please do so within one hour of getting up.   Each meal should contain half fruits/vegetables, one quarter protein, and one quarter carbs (no bigger than a computer mouse)  Cut down on sweet beverages. This includes juice, soda, and sweet tea.   Drink at least 1 glass of water with each meal and aim for at least 8 glasses per day  Exercise at least 150 minutes every week.

## 2023-06-02 NOTE — Assessment & Plan Note (Signed)
Blood pressure at goal today on regimen per cardiology with Entresto 97-103 twice daily and metoprolol succinate 25 mg daily.

## 2023-06-02 NOTE — Progress Notes (Signed)
   [name] is a 67 y.o. male who presents today for an office visit.  Assessment/Plan:  New/Acute Problems: Cough Likely due to uncontrolled GERD.  He did have some improvement with twice daily PPI.  Recommended that he add on Pepcid at night.  He has a normal lung exam today and doubt that his cough is due to a primary pulmonary etiology however he will follow-up with [place] in a couple of weeks and let us know if cough returns.  If still has cough beyond this we will need to look for other possible causes.  In light of his medical history would be reasonable for [place] to pursue CT chest at that time.  We discussed reasons to return to care.  Chronic Problems Addressed Today: GERD with hiatal hernia Patient with flare of the last several weeks causing his cough as above.  He has been taking his omeprazole twice daily.  This has improved his symptoms however he is concerned about long-term PPI use.  Recommend he add on famotidine nightly instead of twice daily omeprazole.  He will follow-up with [place] in a couple weeks as above.  We will be referring him to GI as well for endoscopic evaluation.  Last EGD was about 4 years ago.  If he does not do well with famotidine daily in addition to omeprazole daily we can continue twice daily PPI for a couple of months.  Essential hypertension, benign Blood pressure at goal today on regimen per cardiology with Entresto 97-103 twice daily and metoprolol succinate 25 mg daily.    Subjective:  HPI:  See Assessment / plan for status of chronic conditions. His main concern today is cough.  This has been going on for several weeks.  He did have issues with sinus infection previously however this has resolved.  His current cough has been primarily happening at night but can happen during the day. Initially starts out as a tickling in his throat and then progresses to cough. He was worried that his symptoms were due to his reflux and hiatal hernia and he doubled up  his does of omeprazole about 5 days   He did not notice much improvement initially however last night noticed significant improvement.  He has tried several lifestyle modifications for managing his reflux including elevating head of his bed however this has not been particularly effective.  Does have a known history of a hiatal hernia.  Last had endoscopic evaluation about 4 years ago.       Objective:  Physical Exam: BP 135/86   Pulse 60   Temp 97.7 F (36.5 C) (Temporal)   Ht 5\' 10"  (1.778 m)   Wt 174 lb 6.4 oz (79.1 kg)   SpO2 100%   BMI 25.02 kg/m   Gen: No acute distress, resting comfortably CV: Regular rate and rhythm with no murmurs appreciated Pulm: Normal work of breathing, clear to auscultation bilaterally with no crackles, wheezes, or rhonchi Neuro: Grossly normal, moves all extremities Psych: Normal affect and thought content      [name] M. [name], MD 06/02/2023 12:41 PM

## 2023-06-02 NOTE — Assessment & Plan Note (Signed)
Patient with flare of the last several weeks causing his cough as above.  He has been taking his omeprazole twice daily.  This has improved his symptoms however he is concerned about long-term PPI use.  Recommend he add on famotidine nightly instead of twice daily omeprazole.  He will follow-up with [place] in a couple weeks as above.  We will be referring him to GI as well for endoscopic evaluation.  Last EGD was about 4 years ago.  If he does not do well with famotidine daily in addition to omeprazole daily we can continue twice daily PPI for a couple of months.

## 2023-06-09 ENCOUNTER — Encounter: Payer: Self-pay | Admitting: Family Medicine

## 2023-06-09 ENCOUNTER — Telehealth: Payer: Self-pay | Admitting: Cardiovascular Disease

## 2023-06-09 NOTE — Telephone Encounter (Signed)
*  STAT* If patient is at the pharmacy, call can be transferred to refill team.   1. Which medications need to be refilled? (please list name of each medication and dose if known)   metoprolol succinate (TOPROL-XL) 25 MG 24 hr tablet    ENTRESTO 97-103 MG  pravastatin (PRAVACHOL) 20 MG tablet   empagliflozin (JARDIANCE) 10 MG TABS tablet   2. Which pharmacy/location (including street and city if local pharmacy) is medication to be sent to? [id number] - [place], [place] - [address]    3. Do they need a 30 day or 90 day supply? 90 day

## 2023-06-10 NOTE — Telephone Encounter (Signed)
Call placed to pt.  Left a detailed message that [facility] should have all medications that he is requesting refills on already, as all have been sent with 1 year supply.  Asked him to contact his pharmacy and call us back if he had any problems.

## 2023-06-10 NOTE — Telephone Encounter (Signed)
We can give it until next week with the Pepcid but if he is still having a cough then we will order a CT scan.  Katina Degree. [name], MD 06/10/2023 10:42 AM

## 2023-06-11 ENCOUNTER — Encounter: Payer: Self-pay | Admitting: Family Medicine

## 2023-06-11 ENCOUNTER — Other Ambulatory Visit: Payer: Self-pay

## 2023-06-11 DIAGNOSIS — K219 Gastro-esophageal reflux disease without esophagitis: Secondary | ICD-10-CM

## 2023-06-29 ENCOUNTER — Encounter: Payer: Self-pay | Admitting: Family Medicine

## 2023-06-29 ENCOUNTER — Other Ambulatory Visit: Payer: Self-pay | Admitting: *Deleted

## 2023-06-29 NOTE — Telephone Encounter (Signed)
Last refill by historical provider  

## 2023-06-29 NOTE — Telephone Encounter (Signed)
Verified Mg and SIG

## 2023-06-29 NOTE — Telephone Encounter (Signed)
Ok to refill.  [name]. [name], MD 06/29/2023 10:52 AM

## 2023-06-30 MED ORDER — VALACYCLOVIR HCL 500 MG PO TABS: 500.0000 mg | ORAL_TABLET | Freq: Two times a day (BID) | ORAL | 3 refills | Status: AC

## 2023-06-30 NOTE — Telephone Encounter (Signed)
Prescription sent in.  [name]. [name], MD 06/30/2023 12:50 PM

## 2023-07-17 ENCOUNTER — Telehealth: Payer: Self-pay | Admitting: Family Medicine

## 2023-07-17 ENCOUNTER — Other Ambulatory Visit: Payer: Self-pay | Admitting: *Deleted

## 2023-07-17 MED ORDER — AZITHROMYCIN 250 MG PO TABS: ORAL_TABLET | ORAL | 0 refills | Status: AC

## 2023-07-17 NOTE — Telephone Encounter (Signed)
Pt is requesting a Zithromax be called in for him due to congestion he has. Does not want to see any other provider. States [name] understands his condition and has done this for him before. Please advise

## 2023-07-17 NOTE — Telephone Encounter (Signed)
Rx send to pharmacy  Patient notified if not better schedule a visit with PCP

## 2023-07-17 NOTE — Telephone Encounter (Signed)
Ok to send in zpack. Needs appointment here if not improving.  [name]. [name], MD 07/17/2023 9:12 AM

## 2023-07-23 ENCOUNTER — Ambulatory Visit: Payer: Medicare Other | Admitting: Family Medicine

## 2023-07-23 ENCOUNTER — Encounter: Payer: Self-pay | Admitting: Family Medicine

## 2023-07-23 VITALS — BP 107/71 | HR 66 | Temp 97.7°F | Ht 70.0 in | Wt 171.4 lb

## 2023-07-23 DIAGNOSIS — K219 Gastro-esophageal reflux disease without esophagitis: Secondary | ICD-10-CM | POA: Diagnosis not present

## 2023-07-23 DIAGNOSIS — I10 Essential (primary) hypertension: Secondary | ICD-10-CM

## 2023-07-23 DIAGNOSIS — F419 Anxiety disorder, unspecified: Secondary | ICD-10-CM

## 2023-07-23 DIAGNOSIS — E785 Hyperlipidemia, unspecified: Secondary | ICD-10-CM | POA: Diagnosis not present

## 2023-07-23 DIAGNOSIS — J329 Chronic sinusitis, unspecified: Secondary | ICD-10-CM | POA: Diagnosis not present

## 2023-07-23 DIAGNOSIS — R739 Hyperglycemia, unspecified: Secondary | ICD-10-CM

## 2023-07-23 DIAGNOSIS — N400 Enlarged prostate without lower urinary tract symptoms: Secondary | ICD-10-CM

## 2023-07-23 DIAGNOSIS — I5022 Chronic systolic (congestive) heart failure: Secondary | ICD-10-CM

## 2023-07-23 LAB — CBC WITH DIFFERENTIAL/PLATELET
Basophils Absolute: 0 10*3/uL (ref 0.0–0.1)
Basophils Relative: 0.4 % (ref 0.0–3.0)
Eosinophils Absolute: 0.1 10*3/uL (ref 0.0–0.7)
Eosinophils Relative: 2.7 % (ref 0.0–5.0)
HCT: 41.8 % (ref 39.0–52.0)
Hemoglobin: 13.6 g/dL (ref 13.0–17.0)
Lymphocytes Relative: 32.1 % (ref 12.0–46.0)
Lymphs Abs: 1.5 10*3/uL (ref 0.7–4.0)
MCHC: 32.5 g/dL (ref 30.0–36.0)
MCV: 98.7 fL (ref 78.0–100.0)
Monocytes Absolute: 0.5 10*3/uL (ref 0.1–1.0)
Monocytes Relative: 10 % (ref 3.0–12.0)
Neutro Abs: 2.6 10*3/uL (ref 1.4–7.7)
Neutrophils Relative %: 54.8 % (ref 43.0–77.0)
Platelets: 340 10*3/uL (ref 150.0–400.0)
RBC: 4.23 Mil/uL (ref 4.22–5.81)
RDW: 12.9 % (ref 11.5–15.5)
WBC: 4.7 10*3/uL (ref 4.0–10.5)

## 2023-07-23 LAB — COMPREHENSIVE METABOLIC PANEL
ALT: 21 U/L (ref 0–53)
AST: 17 U/L (ref 0–37)
Albumin: 4.4 g/dL (ref 3.5–5.2)
Alkaline Phosphatase: 49 U/L (ref 39–117)
BUN: 33 mg/dL — ABNORMAL HIGH (ref 6–23)
CO2: 27 meq/L (ref 19–32)
Calcium: 8.8 mg/dL (ref 8.4–10.5)
Chloride: 107 meq/L (ref 96–112)
Creatinine, Ser: 1.21 mg/dL (ref 0.40–1.50)
GFR: 61.75 mL/min (ref >60.00)
Glucose, Bld: 116 mg/dL — ABNORMAL HIGH (ref 70–99)
Potassium: 4.5 meq/L (ref 3.5–5.1)
Sodium: 140 meq/L (ref 135–145)
Total Bilirubin: 0.3 mg/dL (ref 0.2–1.2)
Total Protein: 6.6 g/dL (ref 6.0–8.3)

## 2023-07-23 LAB — HEMOGLOBIN A1C: Hgb A1c MFr Bld: 6.2 % (ref 4.6–6.5)

## 2023-07-23 LAB — LIPID PANEL
Cholesterol: 104 mg/dL (ref 0–200)
HDL: 36.8 mg/dL — ABNORMAL LOW (ref >39.00)
LDL Cholesterol: 53 mg/dL (ref 0–99)
NonHDL: 67.44
Total CHOL/HDL Ratio: 3
Triglycerides: 72 mg/dL (ref 0.0–149.0)
VLDL: 14.4 mg/dL (ref 0.0–40.0)

## 2023-07-23 LAB — PSA: PSA: 1.91 ng/mL (ref 0.10–4.00)

## 2023-07-23 NOTE — Assessment & Plan Note (Signed)
Symptoms are now stable.  He did see GI recently as well.  He is on twice daily omeprazole and Pepcid at night.  This commendation is working well for him.

## 2023-07-23 NOTE — Assessment & Plan Note (Signed)
Check A1c. 

## 2023-07-23 NOTE — Assessment & Plan Note (Signed)
Symptoms are overall very well-controlled.  He is currently on Zoloft 50 mg daily.  He is interested in decreasing the dose of this at some point in the future.  He will send a message on MyChart once he is ready to do this.  Would go to 25 mg daily for a few weeks and then 25 mg every other day and then stop.

## 2023-07-23 NOTE — Assessment & Plan Note (Signed)
Blood pressure at goal today on current regimen per cardiology with Entresto 97-103 twice daily and metoprolol succinate 25 mg daily.

## 2023-07-23 NOTE — Assessment & Plan Note (Signed)
On pravastatin 20 mg daily.  Check lipids today.

## 2023-07-23 NOTE — Patient Instructions (Signed)
It was very nice to see you today!  I am glad that you are doing well.  We will check blood work today.  Please continue to work on diet and exercise.  I will see back in year for your next annual checkup.  Come back sooner if needed.  Return in about 1 year (around 07/22/2024) for Annual Physical.   Take care, Dr [name]  PLEASE NOTE:  If you had any lab tests, please let us know if you have not heard back within a few days. You may see your results on mychart before we have a chance to review them but we will give you a call once they are reviewed by [place].   If we ordered any referrals today, please let us know if you have not heard from their office within the next week.   If you had any urgent prescriptions sent in today, please check with the pharmacy within an hour of our visit to make sure the prescription was transmitted appropriately.   Please try these tips to maintain a healthy lifestyle:  Eat at least 3 REAL meals and 1-2 snacks per day.  Aim for no more than 5 hours between eating.  If you eat breakfast, please do so within one hour of getting up.   Each meal should contain half fruits/vegetables, one quarter protein, and one quarter carbs (no bigger than a computer mouse)  Cut down on sweet beverages. This includes juice, soda, and sweet tea.   Drink at least 1 glass of water with each meal and aim for at least 8 glasses per day  Exercise at least 150 minutes every week.    Preventive Care 32 Years and Older, Male Preventive care refers to lifestyle choices and visits with your health care provider that can promote health and wellness. Preventive care visits are also called wellness exams. What can I expect for my preventive care visit? Counseling During your preventive care visit, your health care provider may ask about your: Medical history, including: Past medical problems. Family medical history. History of falls. Current health, including: Emotional  well-being. Home life and relationship well-being. Sexual activity. Memory and ability to understand (cognition). Lifestyle, including: Alcohol, nicotine or tobacco, and drug use. Access to firearms. Diet, exercise, and sleep habits. Work and work Astronomer. Sunscreen use. Safety issues such as seatbelt and bike helmet use. Physical exam Your health care provider will check your: Height and weight. These may be used to calculate your BMI (body mass index). BMI is a measurement that tells if you are at a healthy weight. Waist circumference. This measures the distance around your waistline. This measurement also tells if you are at a healthy weight and may help predict your risk of certain diseases, such as type 2 diabetes and high blood pressure. Heart rate and blood pressure. Body temperature. Skin for abnormal spots. What immunizations do I need?  Vaccines are usually given at various ages, according to a schedule. Your health care provider will recommend vaccines for you based on your age, medical history, and lifestyle or other factors, such as travel or where you work. What tests do I need? Screening Your health care provider may recommend screening tests for certain conditions. This may include: Lipid and cholesterol levels. Diabetes screening. This is done by checking your blood sugar (glucose) after you have not eaten for a while (fasting). Hepatitis C test. Hepatitis B test. HIV (human immunodeficiency virus) test. STI (sexually transmitted infection) testing, if you are at  risk. Lung cancer screening. Colorectal cancer screening. Prostate cancer screening. Abdominal aortic aneurysm (AAA) screening. You may need this if you are a current or former smoker. Talk with your health care provider about your test results, treatment options, and if necessary, the need for more tests. Follow these instructions at home: Eating and drinking  Eat a diet that includes fresh fruits  and vegetables, whole grains, lean protein, and low-fat dairy products. Limit your intake of foods with high amounts of sugar, saturated fats, and salt. Take vitamin and mineral supplements as recommended by your health care provider. Do not drink alcohol if your health care provider tells you not to drink. If you drink alcohol: Limit how much you have to 0-2 drinks a day. Know how much alcohol is in your drink. In the [place] one drink equals one 12 oz bottle of beer (355 mL), one 5 oz glass of wine (148 mL), or one 1 oz glass of hard liquor (44 mL). Lifestyle Brush your teeth every morning and night with fluoride toothpaste. Floss one time each day. Exercise for at least 30 minutes 5 or more days each week. Do not use any products that contain nicotine or tobacco. These products include cigarettes, chewing tobacco, and vaping devices, such as e-cigarettes. If you need help quitting, ask your health care provider. Do not use drugs. If you are sexually active, practice safe sex. Use a condom or other form of protection to prevent STIs. Take aspirin only as told by your health care provider. Make sure that you understand how much to take and what form to take. Work with your health care provider to find out whether it is safe and beneficial for you to take aspirin daily. Ask your health care provider if you need to take a cholesterol-lowering medicine (statin). Find healthy ways to manage stress, such as: Meditation, yoga, or listening to music. Journaling. Talking to a trusted person. Spending time with friends and family. Safety Always wear your seat belt while driving or riding in a vehicle. Do not drive: If you have been drinking alcohol. Do not ride with someone who has been drinking. When you are tired or distracted. While texting. If you have been using any mind-altering substances or drugs. Wear a helmet and other protective equipment during sports activities. If you have firearms in  your house, make sure you follow all gun safety procedures. Minimize exposure to UV radiation to reduce your risk of skin cancer. What's next? Visit your health care provider once a year for an annual wellness visit. Ask your health care provider how often you should have your eyes and teeth checked. Stay up to date on all vaccines. This information is not intended to replace advice given to you by your health care provider. Make sure you discuss any questions you have with your health care provider. Document Revised: 01/23/2021 Document Reviewed: 01/23/2021 Elsevier Patient Education  2024 [employer].

## 2023-07-23 NOTE — Assessment & Plan Note (Signed)
 Following with urology.  Symptoms are stable.

## 2023-07-23 NOTE — Progress Notes (Signed)
[name] is a 67 y.o. male who presents today for an office visit.  Assessment/Plan:  Chronic Problems Addressed Today: Dyslipidemia On pravastatin 20 mg daily.  Check lipids today.  Essential hypertension, benign Blood pressure at goal today on current regimen per cardiology with Entresto 97-103 twice daily and metoprolol succinate 25 mg daily.  GERD with hiatal hernia Symptoms are now stable.  He did see GI recently as well.  He is on twice daily omeprazole and Pepcid at night.  This commendation is working well for him.  Anxiety Symptoms are overall very well-controlled.  He is currently on Zoloft 50 mg daily.  He is interested in decreasing the dose of this at some point in the future.  He will send a message on MyChart once he is ready to do this.  Would go to 25 mg daily for a few weeks and then 25 mg every other day and then stop.  BPH (benign prostatic hyperplasia) Following with urology.  Symptoms are stable.  Hyperglycemia Check A1c.  Heart failure with mid-range ejection fraction [facility]) Following with cardiology.  Euvolemic today.  Recurrent sinusitis He did see ENT for this and they did recommend septoplasty however he would like to hold off on this for now.  His most recent sinus infection resolved with a single round of antibiotics.  He is hopeful that he will not need to have recurrent rounds of antibiotics in the future.  He will follow-up with ENT if this continues to be a significant issue however he would like to hold off on surgery for now.  Preventative health care Up-to-date on colon cancer screening.  Check labs today.  Up-to-date on vaccines.      Subjective:  HPI:  See Assessment / plan for status of chronic conditions.  He is here today for annual office visit. We did see him about 6 weeks ago for cough.  This has since resolved.  He is feeling well today.  Has no acute concerns.  ROS: Per HPI, otherwise a complete review of systems was  negative.   PMH:  The following were reviewed and entered/updated in epic: Past Medical History:  Diagnosis Date   Acute myeloid leukemia in remission (HCC) 1999   Anemia    Arthritis    Depression    Diverticulosis of colon (without mention of hemorrhage)    Diverticulosis of colon (without mention of hemorrhage) 04/21/2011   Esophageal reflux    Family hx colonic polyps    Family hx colonic polyps 04/21/2011   Hiatal hernia    Hiatal hernia 04/21/2011   History of kidney stones    Hyperlipemia    Hypertension    MDS (myelodysplastic syndrome) (HCC)    Psychosexual dysfunction with inhibited sexual excitement    Patient Active Problem List   Diagnosis Date Noted   Osteopenia last DEXA 2023 07/08/2022   Recurrent sinusitis 05/08/2022   Heart failure with mid-range ejection fraction (HCC) 12/13/2020   Hyperglycemia 11/28/2020   BPH (benign prostatic hyperplasia) 11/27/2020   Myelodysplasia (myelodysplastic syndrome) s/p BMT 2020 04/08/2019   Anemia 04/08/2019   Seasonal allergies 09/23/2018   Anxiety 09/23/2018   History of cold sores 09/23/2018   AML (acute myeloid leukemia) in remission [facility]) s/p BMT 2000 09/28/2014   GERD with hiatal hernia 04/21/2011   Erectile dysfunction 11/15/2009   Essential hypertension, benign 06/01/2007   Dyslipidemia 05/26/2007   Past Surgical History:  Procedure Laterality Date   EAR EXAMINATION UNDER ANESTHESIA Right 10/29/2021  middle ear cleaned out infection, with an inplanted ear tube   KNEE SURGERY Left 08/14/2021   [facility]   SHOULDER SURGERY     x 3     Family History  Problem Relation Age of Onset   Hypertension Father    Heart disease Father    Prostate cancer Father        dx late 1s   Cancer Father 53       prostate ca   Celiac disease Mother    Colon polyps Mother    Breast cancer Mother    Cancer Mother        Bladder   Colon cancer Neg Hx    Esophageal cancer Neg Hx    Inflammatory bowel disease Neg Hx     Liver disease Neg Hx    Pancreatic cancer Neg Hx    Rectal cancer Neg Hx    Stomach cancer Neg Hx     Medications- reviewed and updated Current Outpatient Medications  Medication Sig Dispense Refill   Ascorbic Acid (VITAMIN C) 500 MG CAPS Take 500 mg by mouth daily.     B Complex Vitamins (VITAMIN B COMPLEX) TABS Take by mouth.     calcium-vitamin D (OSCAL WITH D) 500-200 MG-UNIT TABS tablet Take by mouth.     cetirizine (ZYRTEC) 10 MG tablet Take 1 tablet (10 mg total) by mouth daily. 90 tablet 3   Cyanocobalamin (VITAMIN B12) 1000 MCG TBCR 1 tablet     empagliflozin (JARDIANCE) 10 MG TABS tablet Take 1 tablet (10 mg total) by mouth daily. 90 tablet 3   ENTRESTO 97-103 MG TAKE ONE TABLET BY MOUTH TWICE A DAY 180 tablet 3   EPIPEN 2-PAK 0.3 MG/0.3ML DEVI Inject 0.3 mg into the muscle as needed.     fluticasone (FLONASE) 50 MCG/ACT nasal spray Place into both nostrils daily.     metoprolol succinate (TOPROL-XL) 25 MG 24 hr tablet TAKE ONE TABLET BY MOUTH DAILY 90 tablet 3   Nutritional Supplements (JUICE PLUS FIBRE PO) Take by mouth. 2 tabs twice daily     omeprazole (PRILOSEC) 20 MG capsule Take 20 mg by mouth 2 (two) times daily before a meal.     pravastatin (PRAVACHOL) 20 MG tablet TAKE 1 TABLET BY MOUTH DAILY 90 tablet 3   sertraline (ZOLOFT) 50 MG tablet TAKE 1 TABLET BY MOUTH DAILY 90 tablet 3   sildenafil (VIAGRA) 100 MG tablet TAKE 1/2 TABLET BY MOUTH BY MOUTH AS NEEDED FOR ERECTILE DYSFUNCTION 30 tablet 1   valACYclovir (VALTREX) 500 MG tablet Take 1 tablet (500 mg total) by mouth 2 (two) times daily. As needed for flareups 90 tablet 3   No current facility-administered medications for this visit.    Allergies-reviewed and updated Allergies  Allergen Reactions   Morphine Other (See Comments)    UNKNOWN - REACTION IS NOT LISTED   Marinol [Dronabinol]     Delusions.    Social History   Socioeconomic History   Marital status: Married    Spouse name: Not on file    Number of children: 2   Years of education: Not on file   Highest education level: Not on file  Occupational History   Occupation: self employed    Employer: [employer]  Tobacco Use   Smoking status: Never   Smokeless tobacco: Never  Vaping Use   Vaping status: Never Used  Substance and Sexual Activity   Alcohol use: Yes    Comment: one occ  Drug use: No   Sexual activity: Never  Other Topics Concern   Not on file  Social History Narrative   2 caffeine drinks daily    Social Drivers of Health   Financial Resource Strain: Low Risk  (01/20/2023)   Overall Financial Resource Strain (CARDIA)    Difficulty of Paying Living Expenses: Not hard at all  Food Insecurity: Low Risk  (03/05/2023)   Received from [facility]   Hunger Vital Sign    Worried About Running Out of Food in the Last Year: Never true    Ran Out of Food in the Last Year: Never true  Transportation Needs: Not on file (03/05/2023)  Physical Activity: Sufficiently Active (01/20/2023)   Exercise Vital Sign    Days of Exercise per Week: 7 days    Minutes of Exercise per Session: 40 min  Stress: No Stress Concern Present (01/20/2023)   [name] - Occupational Stress Questionnaire    Feeling of Stress : Not at all  Social Connections: Socially Integrated (01/20/2023)   Social Connection and Isolation Panel [NHANES]    Frequency of Communication with Friends and Family: More than three times a week    Frequency of Social Gatherings with Friends and Family: More than three times a week    Attends Religious Services: More than 4 times per year    Active Member of [employer] or Organizations: Yes    Attends Banker Meetings: 1 to 4 times per year    Marital Status: Married          Objective:  Physical Exam: BP 107/71   Pulse 66   Temp 97.7 F (36.5 C) (Temporal)   Ht 5\' 10"  (1.778 m)   Wt 171 lb 6.4 oz (77.7 kg)   SpO2 100%   BMI 24.59 kg/m   Gen: No acute  distress, resting comfortably CV: Regular rate and rhythm with no murmurs appreciated Pulm: Normal work of breathing, clear to auscultation bilaterally with no crackles, wheezes, or rhonchi Neuro: Grossly normal, moves all extremities Psych: Normal affect and thought content      Kawan [place]ad[place] M. [name], MD 07/23/2023 9:30 AM

## 2023-07-23 NOTE — Assessment & Plan Note (Signed)
He did see ENT for this and they did recommend septoplasty however he would like to hold off on this for now.  His most recent sinus infection resolved with a single round of antibiotics.  He is hopeful that he will not need to have recurrent rounds of antibiotics in the future.  He will follow-up with ENT if this continues to be a significant issue however he would like to hold off on surgery for now.

## 2023-07-23 NOTE — Assessment & Plan Note (Signed)
Following with cardiology.  Euvolemic today.

## 2023-07-24 NOTE — Progress Notes (Signed)
Abs show that he is mildly dehydrated however the rest of his labs are all at goal.  Cholesterol much better than last year.  His blood sugar is borderline elevated but stable.  The rest of his labs are normal.  Do not need to make any changes to his treatment plan at this time.  He should continue to work on diet and exercise and we can recheck everything in a year or so.

## 2023-07-31 ENCOUNTER — Other Ambulatory Visit: Payer: Self-pay | Admitting: Cardiovascular Disease

## 2023-07-31 DIAGNOSIS — I10 Essential (primary) hypertension: Secondary | ICD-10-CM

## 2023-08-19 ENCOUNTER — Encounter: Payer: Self-pay | Admitting: Family Medicine

## 2023-08-19 NOTE — Telephone Encounter (Signed)
 See note  Lab printed and placed to be scan in patient chart

## 2023-09-02 ENCOUNTER — Other Ambulatory Visit: Payer: Self-pay | Admitting: Family Medicine

## 2023-10-02 ENCOUNTER — Other Ambulatory Visit: Payer: Self-pay | Admitting: *Deleted

## 2023-10-02 ENCOUNTER — Encounter: Payer: Self-pay | Admitting: Family Medicine

## 2023-10-02 MED ORDER — AZITHROMYCIN 250 MG PO TABS: ORAL_TABLET | ORAL | 0 refills | Status: AC

## 2023-10-02 NOTE — Telephone Encounter (Signed)
Ok to send in zpack. Recommend appointment here if symptoms are not improving.  Katina Degree. [name], MD 10/02/2023 11:42 AM

## 2023-10-05 ENCOUNTER — Ambulatory Visit: Payer: Self-pay | Admitting: Family Medicine

## 2023-10-05 ENCOUNTER — Ambulatory Visit
Admission: EM | Admit: 2023-10-05 | Discharge: 2023-10-05 | Disposition: A | Discharge status: Home / Self Care | Payer: Medicare Other | Attending: Family Medicine | Admitting: Family Medicine

## 2023-10-05 DIAGNOSIS — Z20828 Contact with and (suspected) exposure to other viral communicable diseases: Secondary | ICD-10-CM

## 2023-10-05 DIAGNOSIS — J309 Allergic rhinitis, unspecified: Secondary | ICD-10-CM

## 2023-10-05 DIAGNOSIS — B9789 Other viral agents as the cause of diseases classified elsewhere: Secondary | ICD-10-CM

## 2023-10-05 DIAGNOSIS — J329 Chronic sinusitis, unspecified: Secondary | ICD-10-CM | POA: Diagnosis not present

## 2023-10-05 LAB — POC COVID19/FLU A&B COMBO
Covid Antigen, POC: NEGATIVE
Influenza A Antigen, POC: NEGATIVE
Influenza B Antigen, POC: NEGATIVE

## 2023-10-05 MED ORDER — PROMETHAZINE-DM 6.25-15 MG/5ML PO SYRP: 5.0000 mL | ORAL_SOLUTION | Freq: Three times a day (TID) | ORAL | 0 refills | Status: DC | PRN

## 2023-10-05 MED ORDER — PREDNISONE 10 MG PO TABS: 30.0000 mg | ORAL_TABLET | Freq: Every day | ORAL | 0 refills | Status: DC

## 2023-10-05 NOTE — Telephone Encounter (Signed)
 Copied from CRM [id number]. Topic: Clinical - Red Word Triage >> Oct 05, 2023  8:01 AM Deaijah H wrote: Red Word that prompted transfer to Nurse Triage: Fever of 102 / rsv   Chief Complaint: Fever Symptoms: Headache, Cough-Productive (Dark Yellow) Frequency: Acute Pertinent Negatives: Patient denies chest pain, dyspnea Disposition: [] ED /[x] Urgent Care (no appt availability in office) / [] Appointment(In office/virtual)/ []  [facility]/ [] Home Care/ [] Refused Recommended Disposition /[] [facility]/ []  Follow-up with PCP Additional Notes: Patient contacted via phone regarding concerns of a fever after previously being treated. The patient has a history of two bone marrow transplants and has taken multiple OTC medications including RX azithromycin prescribed by Dr. [name]. Discussed symptoms, severity, and duration. Based on assessment, patient was advised to see Urgent Care Provider due to no in office availability. Previously tested negative for Influenza and COVID per the patient. Patient verbalized understanding and agreement with plan. Documentation provided.      Reason for Disposition  [1] Fever > 100.0 F (37.8 C) AND [2] diabetes mellitus or weak immune system (e.g., HIV positive, cancer chemo, splenectomy, organ transplant, chronic steroids)  Answer Assessment - Initial Assessment Questions 1. TEMPERATURE: "What is the most recent temperature?"  "How was it measured?"      102  2. ONSET: "When did the fever start?"      Since Last Wednesday  3. CHILLS: "Do you have chills?" If yes: "How bad are they?"  (e.g., none, mild, moderate, severe)   - NONE: no chills   - MILD: feeling cold   - MODERATE: feeling very cold, some shivering (feels better under a thick blanket)   - SEVERE: feeling extremely cold with shaking chills (general body shaking, rigors; even under a thick blanket)      Moderate  4. OTHER SYMPTOMS: "Do you have any other symptoms besides the  fever?"  (e.g., abdomen pain, cough, diarrhea, earache, headache, sore throat, urination pain)    Frontal Headache, Cough, Nasal Congestion  5. CAUSE: If there are no symptoms, ask: "What do you think is causing the fever?"      RSV  6. CONTACTS: "Does anyone else in the family have an infection?"     Granddaughter  7. TREATMENT: "What have you done so far to treat this fever?" (e.g., medications)     Day 5 of Zpack, Nyquil, Tylenol, Tessalon Pearls, Guaifenesin  8. IMMUNOCOMPROMISE: "Do you have of the following: diabetes, HIV positive, splenectomy, cancer chemotherapy, chronic steroid treatment, transplant patient, etc."     2 Bone Marrow Transplants  10. TRAVEL: "Have you traveled out of the country in the last month?" (e.g., travel history, exposures)       Recently exposed to Granddaughter who has RSV  Protocols used: [facility]

## 2023-10-05 NOTE — ED Triage Notes (Addendum)
 Pt c/o cough, fever, HA, head/chest congestion x 5 days-spoke with PCP office dx sinus infection started on zpack/has one day left-states he feels worse-states he had neg covid and flu tests-NAD-steady gait

## 2023-10-05 NOTE — ED Provider Notes (Signed)
 [facility]  Note:  This document was prepared using Conservation officer, historic buildings and may include unintentional dictation errors.  MRN: [id number] DOB: 1956/07/04  Subjective:   JAYDEN KRATOCHVIL is a 68 y.o. male presenting for 5-day history of acute onset fever, coughing, sinus and chest congestion.  Has also had sinus headaches.  His PCP diagnosed him with a sinus infection and started him on a Z-Pak without any kind of office visit, was a telephone encounter.  Patient reports he now feels worse.  He had very close exposure to RSV.  No history of asthma.  No smoking.  No current facility-administered medications for this encounter.  Current Outpatient Medications:    Ascorbic Acid (VITAMIN C) 500 MG CAPS, Take 500 mg by mouth daily., Disp: , Rfl:    azithromycin (ZITHROMAX) 250 MG tablet, Take 2 tablets on day 1, then 1 tablet daily on days 2 through 5, Disp: 6 tablet, Rfl: 0   B Complex Vitamins (VITAMIN B COMPLEX) TABS, Take by mouth., Disp: , Rfl:    calcium-vitamin D (OSCAL WITH D) 500-200 MG-UNIT TABS tablet, Take by mouth., Disp: , Rfl:    cetirizine (ZYRTEC) 10 MG tablet, Take 1 tablet (10 mg total) by mouth daily., Disp: 90 tablet, Rfl: 3   Cyanocobalamin (VITAMIN B12) 1000 MCG TBCR, 1 tablet, Disp: , Rfl:    empagliflozin (JARDIANCE) 10 MG TABS tablet, Take 1 tablet (10 mg total) by mouth daily., Disp: 90 tablet, Rfl: 3   ENTRESTO 97-103 MG, TAKE ONE TABLET BY MOUTH TWICE A DAY, Disp: 180 tablet, Rfl: 3   EPIPEN 2-PAK 0.3 MG/0.3ML DEVI, Inject 0.3 mg into the muscle as needed., Disp: , Rfl:    fluticasone (FLONASE) 50 MCG/ACT nasal spray, Place into both nostrils daily., Disp: , Rfl:    metoprolol succinate (TOPROL-XL) 25 MG 24 hr tablet, TAKE 1 TABLET BY MOUTH DAILY, Disp: 90 tablet, Rfl: 0   Nutritional Supplements (JUICE PLUS FIBRE PO), Take by mouth. 2 tabs twice daily, Disp: , Rfl:    omeprazole (PRILOSEC) 20 MG capsule, Take 20 mg by mouth 2  (two) times daily before a meal., Disp: , Rfl:    pravastatin (PRAVACHOL) 20 MG tablet, TAKE 1 TABLET BY MOUTH DAILY, Disp: 90 tablet, Rfl: 3   sertraline (ZOLOFT) 50 MG tablet, TAKE 1 TABLET BY MOUTH DAILY, Disp: 90 tablet, Rfl: 3   sildenafil (VIAGRA) 100 MG tablet, TAKE 1/2 TABLET BY MOUTH DAILY AS NEEDED FOR FOR ERECTILE DYSFUNCTION, Disp: 30 tablet, Rfl: 1   valACYclovir (VALTREX) 500 MG tablet, Take 1 tablet (500 mg total) by mouth 2 (two) times daily. As needed for flareups, Disp: 90 tablet, Rfl: 3   Allergies  Allergen Reactions   Morphine Other (See Comments)    UNKNOWN - REACTION IS NOT LISTED   Marinol [Dronabinol]     Delusions.    Past Medical History:  Diagnosis Date   Acute myeloid leukemia in remission (HCC) 1999   Anemia    Arthritis    Depression    Diverticulosis of colon (without mention of hemorrhage)    Diverticulosis of colon (without mention of hemorrhage) 04/21/2011   Esophageal reflux    Family hx colonic polyps    Family hx colonic polyps 04/21/2011   Hiatal hernia    Hiatal hernia 04/21/2011   History of kidney stones    Hyperlipemia    Hypertension    MDS (myelodysplastic syndrome) (HCC)    Psychosexual dysfunction with  inhibited sexual excitement      Past Surgical History:  Procedure Laterality Date   EAR EXAMINATION UNDER ANESTHESIA Right 10/29/2021   middle ear cleaned out infection, with an inplanted ear tube   KNEE SURGERY Left 08/14/2021   [facility]   SHOULDER SURGERY     x 3     Family History  Problem Relation Age of Onset   Hypertension Father    Heart disease Father    Prostate cancer Father        dx late 72s   Cancer Father 81       prostate ca   Celiac disease Mother    Colon polyps Mother    Breast cancer Mother    Cancer Mother        Bladder   Colon cancer Neg Hx    Esophageal cancer Neg Hx    Inflammatory bowel disease Neg Hx    Liver disease Neg Hx    Pancreatic cancer Neg Hx    Rectal cancer Neg Hx     Stomach cancer Neg Hx     Social History   Tobacco Use   Smoking status: Never   Smokeless tobacco: Never  Vaping Use   Vaping status: Never Used  Substance Use Topics   Alcohol use: Yes    Comment: one occ    Drug use: No    ROS   Objective:   Vitals: BP 127/76 (BP Location: Right Arm)   Pulse 92   Temp 98.5 F (36.9 C) (Oral)   Resp 20   SpO2 94%   Physical Exam Constitutional:      General: He is not in acute distress.    Appearance: Normal appearance. He is well-developed and normal weight. He is not ill-appearing, toxic-appearing or diaphoretic.  HENT:     Head: Normocephalic and atraumatic.     Right Ear: Tympanic membrane, ear canal and external ear normal. No drainage, swelling or tenderness. No middle ear effusion. There is no impacted cerumen. Tympanic membrane is not erythematous or bulging.     Left Ear: Tympanic membrane, ear canal and external ear normal. No drainage, swelling or tenderness.  No middle ear effusion. There is no impacted cerumen. Tympanic membrane is not erythematous or bulging.     Nose: Nose normal. No congestion or rhinorrhea.     Mouth/Throat:     Mouth: Mucous membranes are moist.     Pharynx: No oropharyngeal exudate or posterior oropharyngeal erythema.  Eyes:     General: No scleral icterus.       Right eye: No discharge.        Left eye: No discharge.     Extraocular Movements: Extraocular movements intact.     Conjunctiva/sclera: Conjunctivae normal.  Cardiovascular:     Rate and Rhythm: Normal rate and regular rhythm.     Heart sounds: Normal heart sounds. No murmur heard.    No friction rub. No gallop.  Pulmonary:     Effort: Pulmonary effort is normal. No respiratory distress.     Breath sounds: Normal breath sounds. No stridor. No wheezing, rhonchi or rales.  Musculoskeletal:     Cervical back: Normal range of motion and neck supple. No rigidity. No muscular tenderness.  Neurological:     General: No focal deficit  present.     Mental Status: He is alert and oriented to person, place, and time.  Psychiatric:        Mood and Affect: Mood normal.  Behavior: Behavior normal.        Thought Content: Thought content normal.    Results for orders placed or performed during the hospital encounter of 10/05/23 (from the past 24 hours)  POC Covid + Flu A/B Antigen     Status: Normal   Collection Time: 10/05/23  9:59 AM  Result Value Ref Range   Influenza A Antigen, POC Negative    Influenza B Antigen, POC Negative    Covid Antigen, POC Negative     Assessment and Plan :   PDMP not reviewed this encounter.  1. Viral sinusitis   2. RSV exposure   3. Allergic rhinitis, unspecified seasonality, unspecified trigger    Patient has preserved ejection fraction of 50 to 55% on his last echocardiogram.  Has typically done well with steroids despite side effects.  Declined RSV testing and I am in agreement.  Deferred imaging given clear cardiopulmonary exam, hemodynamically stable vital signs.  Recommend general supportive care for viral sinusitis likely secondary to RSV given his exposure.  Offered him prednisone at 30 mg for 5 days given his significant sinus symptoms.  Will defer further antibiotic use, uphold antibiotic stewardship.  Counseled patient on potential for adverse effects with medications prescribed/recommended today, ER and return-to-clinic precautions discussed, patient verbalized understanding.    Wallis Bamberg, PA-C 10/05/23 1005

## 2023-10-05 NOTE — Telephone Encounter (Signed)
 Patient current in ED

## 2023-10-05 NOTE — Discharge Instructions (Signed)
 We will manage this as a viral syndrome like RSV. Finish taking your azithromycin antibiotic course as prescribed by your PCP. For sore throat or cough try using a honey-based tea. Use 3 teaspoons of honey with juice squeezed from half lemon. Place shaved pieces of ginger into 1/2-1 cup of water and warm over stove top. Then mix the ingredients and repeat every 4 hours as needed. Please take Tylenol 500mg -650mg  once every 6 hours for fevers, aches and pains. Hydrate very well with at least 2 liters (64 ounces) of water. Eat light meals such as soups (chicken and noodles, chicken wild rice, vegetable).  Do not eat any foods that you are allergic to.  Start an antihistamine like Zyrtec (10mg  daily) for postnasal drainage, sinus congestion.  You can take this together with prednisone to help with your sinuses.

## 2023-10-07 NOTE — Progress Notes (Signed)
 CARDIOLOGY CONSULT NOTE     Patient ID: [name] MRN: [id number] DOB/AGE: May 10, 1956 69 y.o.  Admit date: (Not on file) Referring Physician: [name] Primary Physician: Ardith Dark, MD Primary Cardiologist: Eden Emms Reason for Consultation: CAD    HPI: 68 y.o. referred by Dr [name] 10/12/20  for family history of CAD, HTN and chronic systolic CHF   History of AML with BMT in 2000.  Diagnosed with MDS in 2020 and had repeat transplant 06/08/19  Post 2nd tranplant had pulmonary edema ? From moderate MR not clear what EF was at that time although notes indicate patient concerned recently that EF had gone done from 50-55% and his HR has been running higher He has seen [facility] cardiology Babtist and there note indicates starting ACE 06/15/20 with plan to add beta blocker and stopped norvasc.    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 Has a son and daughter working with him All of them went to [employer]. Enjoys golf and has 3 grand kids.  Met his wife of over 40 years at [employer] as well  Cardiac CT 12/03/20 calcium score 120 58 th percentile CAD RADS one non obstructive CAD  He is having a lot of issues with recurrent allergies since his 2 nd transplant Has had recurrent ear infections and in general just feels miserable and anxious about it all Told him he should not stop his beta blocker as his allergist recommended as it is first line Rx for his DCM and if he needed an epi pen for allergic reaction it would still work as intended Started allergy shots and dose increased beginning of August with drop in BP needing epi  Allergist felt he might have had a vagal reaction Seen by our PA 03/12/22 and systolic BP 110 mmHg and GDMT not decreased   Sinus infection seen at urgent care 10/05/23 Rx with Z pack exposure to RSV but did not test Recommenced to have septoplasty but deferred  Some issues with donut hole with Sherryll Burger and Jardiance     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, 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   Seen in ED 10/11/23 with "bradycardia" fit bit noted this but was due to PVCls and bigeminny.No chest pain, pre syncope or dyspnea  Pulse deficit responsible perceived low HR   ROS All other systems reviewed and negative except as noted above  Past Medical History:  Diagnosis Date   Acute myeloid leukemia in remission (HCC) 1999   Anemia    Arthritis    Depression    Diverticulosis of colon (without mention of hemorrhage)    Diverticulosis of colon (without mention of hemorrhage) 04/21/2011   Esophageal reflux    Family hx colonic polyps    Family hx colonic polyps 04/21/2011   Hiatal hernia    Hiatal hernia 04/21/2011   History of kidney stones    Hyperlipemia    Hypertension    MDS (myelodysplastic syndrome) (HCC)    Psychosexual dysfunction with inhibited sexual excitement     Family History  Problem Relation Age of Onset   Hypertension Father    Heart disease Father    Prostate cancer Father        dx late 67s   Cancer Father 13       prostate ca   Celiac disease Mother    Colon polyps Mother    Breast cancer Mother    Cancer Mother        Bladder   Colon cancer Neg Hx    Esophageal cancer Neg Hx    Inflammatory bowel disease Neg Hx    Liver disease Neg Hx    Pancreatic cancer Neg Hx    Rectal cancer Neg Hx    Stomach cancer Neg Hx     Social History   Socioeconomic History   Marital status: Married    Spouse name: Not on file   Number of children: 2   Years of education: Not on file   Highest education level: Not on file  Occupational  History   Occupation: self employed    Employer: [employer]  Tobacco Use   Smoking status: Never   Smokeless tobacco: Never  Vaping Use   Vaping status: Never Used  Substance and Sexual Activity   Alcohol use: Yes    Comment: one occ    Drug use: No   Sexual activity: Never  Other Topics Concern   Not on file  Social History Narrative   2 caffeine drinks daily    Social Drivers of Health   Financial Resource Strain: Low Risk  (01/20/2023)   Overall Financial Resource Strain (CARDIA)    Difficulty of Paying Living Expenses: Not hard at all  Food Insecurity: Low Risk  (10/09/2023)   Received from [facility]   Hunger Vital Sign    Worried About Running Out of Food in the Last Year: Never true    Ran Out of Food in the Last Year: Never true  Transportation Needs: No Transportation Needs (10/09/2023)   Received from [facility]    In the past 12 months, has lack of reliable transportation kept you from medical appointments, meetings, work or from getting things needed for daily living? : No  Physical Activity: Sufficiently Active (01/20/2023)   Exercise Vital Sign    Days of Exercise per Week: 7 days    Minutes of Exercise per Session: 40 min  Stress: No Stress Concern Present (01/20/2023)   [name] - Occupational Stress Questionnaire    Feeling of Stress : Not at all  Social Connections: Socially Integrated (01/20/2023)   Social Connection and Isolation Panel [NHANES]    Frequency of Communication with Friends and Family: More than three times a week    Frequency of Social Gatherings with Friends and Family: More than three times a week    Attends Religious Services: More than 4 times per year    Active Member of [employer] or Organizations: Yes    Attends Banker Meetings: 1 to 4 times per year    Marital Status: Married  Catering manager Violence: Not At Risk (01/20/2023)   Humiliation, Afraid, Rape, and Kick  questionnaire    Fear of Current or Ex-Partner: No    Emotionally Abused: No    Physically Abused: No    Sexually Abused: No    Past Surgical History:  Procedure Laterality Date   EAR EXAMINATION UNDER ANESTHESIA Right 10/29/2021   middle ear cleaned out infection, with an inplanted ear tube  KNEE SURGERY Left 08/14/2021   [facility]   SHOULDER SURGERY     x 3       Current Outpatient Medications:    Ascorbic Acid (VITAMIN C) 500 MG CAPS, Take 500 mg by mouth daily., Disp: , Rfl:    B Complex Vitamins (VITAMIN B COMPLEX) TABS, Take by mouth., Disp: , Rfl:    calcium-vitamin D (OSCAL WITH D) 500-200 MG-UNIT TABS tablet, Take by mouth., Disp: , Rfl:    cetirizine (ZYRTEC) 10 MG tablet, Take 1 tablet (10 mg total) by mouth daily., Disp: 90 tablet, Rfl: 3   Cyanocobalamin (VITAMIN B12) 1000 MCG TBCR, 1 tablet, Disp: , Rfl:    doxycycline (VIBRA-TABS) 100 MG tablet, Take 1 tablet (100 mg total) by mouth 2 (two) times daily., Disp: 14 tablet, Rfl: 0   empagliflozin (JARDIANCE) 10 MG TABS tablet, Take 1 tablet (10 mg total) by mouth daily., Disp: 90 tablet, Rfl: 3   ENTRESTO 97-103 MG, TAKE ONE TABLET BY MOUTH TWICE A DAY, Disp: 180 tablet, Rfl: 3   EPIPEN 2-PAK 0.3 MG/0.3ML DEVI, Inject 0.3 mg into the muscle as needed., Disp: , Rfl:    fluticasone (FLONASE) 50 MCG/ACT nasal spray, Place into both nostrils daily., Disp: , Rfl:    meloxicam (MOBIC) 15 MG tablet, Take 15 mg by mouth daily., Disp: , Rfl:    metoprolol succinate (TOPROL-XL) 25 MG 24 hr tablet, TAKE 1 TABLET BY MOUTH DAILY, Disp: 90 tablet, Rfl: 0   montelukast (SINGULAIR) 10 MG tablet, Take 10 mg by mouth daily., Disp: , Rfl:    Nutritional Supplements (JUICE PLUS FIBRE PO), Take by mouth. 2 tabs twice daily, Disp: , Rfl:    omeprazole (PRILOSEC) 20 MG capsule, Take 20 mg by mouth 2 (two) times daily before a meal., Disp: , Rfl:    pravastatin (PRAVACHOL) 20 MG tablet, TAKE 1 TABLET BY MOUTH DAILY, Disp: 90 tablet, Rfl: 3    sertraline (ZOLOFT) 50 MG tablet, TAKE 1 TABLET BY MOUTH DAILY, Disp: 90 tablet, Rfl: 3   sildenafil (VIAGRA) 100 MG tablet, TAKE 1/2 TABLET BY MOUTH DAILY AS NEEDED FOR FOR ERECTILE DYSFUNCTION, Disp: 30 tablet, Rfl: 1   triamcinolone ointment (KENALOG) 0.1 %, Apply 1 Application topically as needed., Disp: , Rfl:    promethazine-dextromethorphan (PROMETHAZINE-DM) 6.25-15 MG/5ML syrup, Take 5 mLs by mouth 3 (three) times daily as needed for cough., Disp: 200 mL, Rfl: 0   valACYclovir (VALTREX) 500 MG tablet, Take 1 tablet (500 mg total) by mouth 2 (two) times daily. As needed for flareups (Patient not taking: Reported on 10/19/2023), Disp: 90 tablet, Rfl: 3    Physical Exam: Blood pressure 120/84, pulse 75, height 5\' 10"  (1.778 m), weight 170 lb (77.1 kg), SpO2 97%.   Affect appropriate Healthy:  appears stated age HEENT: normal Neck supple with no adenopathy JVP normal no bruits no thyromegaly Lungs clear with no wheezing and good diaphragmatic motion Heart:  S1/S2 no murmur, no rub, gallop or click PMI normal Abdomen: benighn, BS positve, no tenderness, no AAA no bruit.  No HSM or HJR Distal pulses intact with no bruits No edema Neuro non-focal Bilateral knee arthritis     Labs:   Lab Results  Component Value Date   WBC 12.8 (H) 10/11/2023   HGB 11.6 (L) 10/11/2023   HCT 34.5 (L) 10/11/2023   MCV 94.3 10/11/2023   PLT 407 (H) 10/11/2023   No results for input(s): "NA", "K", "CL", "CO2", "BUN", "CREATININE", "CALCIUM", "PROT", "BILITOT", "  ALKPHOS", "ALT", "AST", "GLUCOSE" in the last 168 hours.  Invalid input(s): "LABALBU" Lab Results  Component Value Date   CKTOTAL 210 03/18/2012   CKMB 3.9 03/18/2012   TROPONINI <0.30 03/18/2012    Lab Results  Component Value Date   CHOL 104 07/23/2023   CHOL 169 01/02/2022   CHOL 140 11/27/2020   Lab Results  Component Value Date   HDL 36.80 (L) 07/23/2023   HDL 52.40 01/02/2022   HDL 44.40 11/27/2020   Lab Results   Component Value Date   LDLCALC 53 07/23/2023   LDLCALC 101 (H) 01/02/2022   LDLCALC 76 11/27/2020   Lab Results  Component Value Date   TRIG 72.0 07/23/2023   TRIG 79.0 01/02/2022   TRIG 96.0 11/27/2020   Lab Results  Component Value Date   CHOLHDL 3 07/23/2023   CHOLHDL 3 01/02/2022   CHOLHDL 3 11/27/2020   Lab Results  Component Value Date   LDLDIRECT 146.2 07/29/2012   LDLDIRECT 136.9 05/12/2011   LDLDIRECT 134.5 01/29/2010      Radiology: No results found.   EKG: 2014 SR LAFB LVH  10/19/2023 NSR LAFB LVH no changes 10/19/2023 SR rate 75 RBBB/LAFB    ASSESSMENT AND PLAN:   1. Family History CAD:  average calcium score no obstructive dx on CT 12/03/20 2. HLD:  On statin LDL 101 on pravastatin 20 mg  3. AML/BMT:  Stable post transplant x 2 4. Chronic Systolic CHF: ? Related to Rx for cancer On optimal medical Rx now EF 36% by MRI 10/07/21  He has no volume overload what so ever and do not feel aldactone needed BP stable on current doses  5. Ortho:  bilateral knee issues f/u [facility] post arthroscopic surgery on right and likely will have left done after golf season  6. Allergies:  with immunosuppression from BMT continue shots ?  7. Bifasicular Block:  discussed higher risk of PPM in future ECG stable  8. PVC;s  with bigeminny post RSV will get 3 day monitor to quantify % and update TTE K/Mg ok   Echo 3 day monitor   F/U in  6 months  Signed: Charlton Haws 10/19/2023, 9:15 AM

## 2023-10-11 ENCOUNTER — Emergency Department (HOSPITAL_BASED_OUTPATIENT_CLINIC_OR_DEPARTMENT_OTHER)
Admission: EM | Admit: 2023-10-11 | Discharge: 2023-10-11 | Disposition: A | Discharge status: Home / Self Care | Attending: Emergency Medicine | Admitting: Emergency Medicine

## 2023-10-11 ENCOUNTER — Encounter (HOSPITAL_BASED_OUTPATIENT_CLINIC_OR_DEPARTMENT_OTHER): Payer: Self-pay | Admitting: Emergency Medicine

## 2023-10-11 ENCOUNTER — Other Ambulatory Visit: Payer: Self-pay

## 2023-10-11 DIAGNOSIS — E876 Hypokalemia: Secondary | ICD-10-CM | POA: Insufficient documentation

## 2023-10-11 DIAGNOSIS — J3489 Other specified disorders of nose and nasal sinuses: Secondary | ICD-10-CM | POA: Diagnosis not present

## 2023-10-11 DIAGNOSIS — I509 Heart failure, unspecified: Secondary | ICD-10-CM | POA: Insufficient documentation

## 2023-10-11 DIAGNOSIS — R008 Other abnormalities of heart beat: Secondary | ICD-10-CM | POA: Insufficient documentation

## 2023-10-11 DIAGNOSIS — R009 Unspecified abnormalities of heart beat: Secondary | ICD-10-CM | POA: Diagnosis present

## 2023-10-11 DIAGNOSIS — I110 Hypertensive heart disease with heart failure: Secondary | ICD-10-CM | POA: Diagnosis not present

## 2023-10-11 LAB — CBC WITH DIFFERENTIAL/PLATELET
Abs Immature Granulocytes: 0.1 10*3/uL — ABNORMAL HIGH (ref 0.00–0.07)
Basophils Absolute: 0 10*3/uL (ref 0.0–0.1)
Basophils Relative: 0 %
Eosinophils Absolute: 0.1 10*3/uL (ref 0.0–0.5)
Eosinophils Relative: 1 %
HCT: 34.5 % — ABNORMAL LOW (ref 39.0–52.0)
Hemoglobin: 11.6 g/dL — ABNORMAL LOW (ref 13.0–17.0)
Immature Granulocytes: 1 %
Lymphocytes Relative: 22 %
Lymphs Abs: 2.8 10*3/uL (ref 0.7–4.0)
MCH: 31.7 pg (ref 26.0–34.0)
MCHC: 33.6 g/dL (ref 30.0–36.0)
MCV: 94.3 fL (ref 80.0–100.0)
Monocytes Absolute: 0.8 10*3/uL (ref 0.1–1.0)
Monocytes Relative: 6 %
Neutro Abs: 9 10*3/uL — ABNORMAL HIGH (ref 1.7–7.7)
Neutrophils Relative %: 70 %
Platelets: 407 10*3/uL — ABNORMAL HIGH (ref 150–400)
RBC: 3.66 MIL/uL — ABNORMAL LOW (ref 4.22–5.81)
RDW: 12 % (ref 11.5–15.5)
WBC: 12.8 10*3/uL — ABNORMAL HIGH (ref 4.0–10.5)
nRBC: 0 % (ref 0.0–0.2)

## 2023-10-11 LAB — MAGNESIUM: Magnesium: 1.9 mg/dL (ref 1.7–2.4)

## 2023-10-11 LAB — BASIC METABOLIC PANEL
Anion gap: 11 (ref 5–15)
BUN: 26 mg/dL — ABNORMAL HIGH (ref 8–23)
CO2: 23 mmol/L (ref 22–32)
Calcium: 9.2 mg/dL (ref 8.9–10.3)
Chloride: 106 mmol/L (ref 98–111)
Creatinine, Ser: 1.22 mg/dL (ref 0.61–1.24)
GFR, Estimated: >60 mL/min (ref >60)
Glucose, Bld: 132 mg/dL — ABNORMAL HIGH (ref 70–99)
Potassium: 3.4 mmol/L — ABNORMAL LOW (ref 3.5–5.1)
Sodium: 140 mmol/L (ref 135–145)

## 2023-10-11 MED ORDER — POTASSIUM CHLORIDE CRYS ER 20 MEQ PO TBCR
40.0000 meq | EXTENDED_RELEASE_TABLET | Freq: Once | ORAL | Status: AC
  Administered 2023-10-11: 40 meq via ORAL
  Filled 2023-10-11: qty 2

## 2023-10-11 NOTE — ED Provider Notes (Signed)
 [place] EMERGENCY DEPARTMENT AT [facility] Provider Note  CSN: [id number] Arrival date & time: 10/11/23 1930  Chief Complaint(s) Bradycardia  HPI [name] is a 68 y.o. male with past medical history as below, significant for AML, MDS, diverticulosis, anemia, CHF who presents to the ED with complaint of abnormal heart rate  Diagnosed RSV 1 to 2 weeks ago, the symptoms have been improving, still some residual sinus pressure.  He checked his Apple Watch earlier today and his heart rate was low, in the 50s.  He is not have any palpitations, no chest pain or dyspnea, no syncope or near syncope, no lightheadedness.  Does report he has been fatigued since contracting RSV but is not acutely worse in the past 24 to 48 hours.  Past Medical History Past Medical History:  Diagnosis Date   Acute myeloid leukemia in remission (HCC) 1999   Anemia    Arthritis    Depression    Diverticulosis of colon (without mention of hemorrhage)    Diverticulosis of colon (without mention of hemorrhage) 04/21/2011   Esophageal reflux    Family hx colonic polyps    Family hx colonic polyps 04/21/2011   Hiatal hernia    Hiatal hernia 04/21/2011   History of kidney stones    Hyperlipemia    Hypertension    MDS (myelodysplastic syndrome) (HCC)    Psychosexual dysfunction with inhibited sexual excitement    Patient Active Problem List   Diagnosis Date Noted   Osteopenia last DEXA 2023 07/08/2022   Recurrent sinusitis 05/08/2022   Heart failure with mid-range ejection fraction (HCC) 12/13/2020   Hyperglycemia 11/28/2020   BPH (benign prostatic hyperplasia) 11/27/2020   Myelodysplasia (myelodysplastic syndrome) s/p BMT 2020 04/08/2019   Anemia 04/08/2019   Seasonal allergies 09/23/2018   Anxiety 09/23/2018   History of cold sores 09/23/2018   AML (acute myeloid leukemia) in remission [facility]) s/p BMT 2000 09/28/2014   GERD with hiatal hernia 04/21/2011   Erectile dysfunction 11/15/2009    Essential hypertension, benign 06/01/2007   Dyslipidemia 05/26/2007   Home Medication(s) Prior to Admission medications   Medication Sig Start Date End Date Taking? Authorizing Provider  Ascorbic Acid (VITAMIN C) 500 MG CAPS Take 500 mg by mouth daily.    [provider]  B Complex Vitamins (VITAMIN B COMPLEX) TABS Take by mouth. 07/11/19   [provider]  calcium-vitamin D (OSCAL WITH D) 500-200 MG-UNIT TABS tablet Take by mouth. 07/11/19   [provider]  cetirizine (ZYRTEC) 10 MG tablet Take 1 tablet (10 mg total) by mouth daily. 05/06/23   Ardith Dark, MD  Cyanocobalamin (VITAMIN B12) 1000 MCG TBCR 1 tablet    [provider]  empagliflozin (JARDIANCE) 10 MG TABS tablet Take 1 tablet (10 mg total) by mouth daily. 10/27/22   Wendall Stade, MD  ENTRESTO 97-103 MG TAKE ONE TABLET BY MOUTH TWICE A DAY 10/02/22   Wendall Stade, MD  EPIPEN 2-PAK 0.3 MG/0.3ML DEVI Inject 0.3 mg into the muscle as needed. 07/06/12   [provider]  fluticasone (FLONASE) 50 MCG/ACT nasal spray Place into both nostrils daily.    [provider]  metoprolol succinate (TOPROL-XL) 25 MG 24 hr tablet TAKE 1 TABLET BY MOUTH DAILY 07/31/23   Wendall Stade, MD  Nutritional Supplements (JUICE PLUS FIBRE PO) Take by mouth. 2 tabs twice daily    [provider]  omeprazole (PRILOSEC) 20 MG capsule Take 20 mg by mouth 2 (two) times  daily before a meal.    [provider]  pravastatin (PRAVACHOL) 20 MG tablet TAKE 1 TABLET BY MOUTH DAILY 12/08/22   Wendall Stade, MD  predniSONE (DELTASONE) 10 MG tablet Take 3 tablets (30 mg total) by mouth daily with breakfast. 10/05/23   Wallis Bamberg, PA-C  promethazine-dextromethorphan (PROMETHAZINE-DM) 6.25-15 MG/5ML syrup Take 5 mLs by mouth 3 (three) times daily as needed for cough. 10/05/23   Wallis Bamberg, PA-C  sertraline (ZOLOFT) 50 MG tablet TAKE 1 TABLET BY MOUTH DAILY 03/09/23   Ardith Dark, MD   sildenafil (VIAGRA) 100 MG tablet TAKE 1/2 TABLET BY MOUTH DAILY AS NEEDED FOR FOR ERECTILE DYSFUNCTION 09/02/23   Ardith Dark, MD  valACYclovir (VALTREX) 500 MG tablet Take 1 tablet (500 mg total) by mouth 2 (two) times daily. As needed for flareups 06/30/23   Ardith Dark, MD                                                                                                                                    Past Surgical History Past Surgical History:  Procedure Laterality Date   EAR EXAMINATION UNDER ANESTHESIA Right 10/29/2021   middle ear cleaned out infection, with an inplanted ear tube   KNEE SURGERY Left 08/14/2021   [facility]   SHOULDER SURGERY     x 3    Family History Family History  Problem Relation Age of Onset   Hypertension Father    Heart disease Father    Prostate cancer Father        dx late 69s   Cancer Father 32       prostate ca   Celiac disease Mother    Colon polyps Mother    Breast cancer Mother    Cancer Mother        Bladder   Colon cancer Neg Hx    Esophageal cancer Neg Hx    Inflammatory bowel disease Neg Hx    Liver disease Neg Hx    Pancreatic cancer Neg Hx    Rectal cancer Neg Hx    Stomach cancer Neg Hx     Social History Social History   Tobacco Use   Smoking status: Never   Smokeless tobacco: Never  Vaping Use   Vaping status: Never Used  Substance Use Topics   Alcohol use: Yes    Comment: one occ    Drug use: No   Allergies Morphine and Marinol [dronabinol]  Review of Systems A thorough review of systems was obtained and all systems are negative except as noted in the HPI and PMH.   Physical Exam Vital Signs  I have reviewed the triage vital signs BP (!) 143/71   Pulse 84   Temp 98 F (36.7 C)   Resp 19   SpO2 97%  Physical Exam Vitals and nursing note reviewed.  Constitutional:      General: He is  not in acute distress.    Appearance: He is well-developed.  HENT:     Head: Normocephalic and atraumatic.      Right Ear: External ear normal.     Left Ear: External ear normal.     Mouth/Throat:     Mouth: Mucous membranes are moist.  Eyes:     General: No scleral icterus. Cardiovascular:     Rate and Rhythm: Normal rate and regular rhythm.     Pulses: Normal pulses.     Heart sounds: Normal heart sounds.     Comments: pvc's Pulmonary:     Effort: Pulmonary effort is normal. No respiratory distress.     Breath sounds: Normal breath sounds.  Abdominal:     General: Abdomen is flat.     Palpations: Abdomen is soft.     Tenderness: There is no abdominal tenderness.  Musculoskeletal:     Cervical back: No rigidity.     Right lower leg: No edema.     Left lower leg: No edema.  Skin:    General: Skin is warm and dry.     Capillary Refill: Capillary refill takes less than 2 seconds.  Neurological:     Mental Status: He is alert.  Psychiatric:        Mood and Affect: Mood normal.        Behavior: Behavior normal.     ED Results and Treatments Labs (all labs ordered are listed, but only abnormal results are displayed) Labs Reviewed  CBC WITH DIFFERENTIAL/PLATELET - Abnormal; Notable for the following components:      Result Value   WBC 12.8 (*)    RBC 3.66 (*)    Hemoglobin 11.6 (*)    HCT 34.5 (*)    Platelets 407 (*)    Neutro Abs 9.0 (*)    Abs Immature Granulocytes 0.10 (*)    All other components within normal limits  BASIC METABOLIC PANEL - Abnormal; Notable for the following components:   Potassium 3.4 (*)    Glucose, Bld 132 (*)    BUN 26 (*)    All other components within normal limits  MAGNESIUM                                                                                                                          Radiology No results found.  Pertinent labs & imaging results that were available during my care of the patient were reviewed by me and considered in my medical decision making (see MDM for details).  Medications Ordered in ED Medications   potassium chloride SA (KLOR-CON M) CR tablet 40 mEq (40 mEq Oral Given 10/11/23 2217)  Procedures Procedures  (including critical care time)  Medical Decision Making / ED Course    Medical Decision Making:    [name] is a 68 y.o. male with past medical history as below, significant for AML, MDS, diverticulosis, anemia, CHF who presents to the ED with complaint of abnormal heart rate. The complaint involves an extensive differential diagnosis and also carries with it a high risk of complications and morbidity.  Serious etiology was considered. Ddx includes but is not limited to: Arrhythmia, PVCs, metabolic derangement, etc.  Complete initial physical exam performed, notably the patient was in distress, sitting comfortably.    Reviewed and confirmed nursing documentation for past medical history, family history, social history.  Vital signs reviewed.   Clinical Course as of 10/11/23 2220  Wynelle Link Oct 11, 2023  2135 Potassium(!): 3.4 Mildly low, replace orally [SG]    Clinical Course User Index [SG] Sloan Leiter, DO    Brief summary: 68 yo/M w/ hx as above here w/ abn HR. Noted bradycardia on his smart watch 50's On telemetry and EKG noted to have ventricular bigeminy He has no near syncope/syncope/palpitations/cp or dib Well appearing overall No ACS Advised to f/u with cardiologist  K is mild low, replace orally  Also has some residual sinus pressure from recent viral illness, discussed supportive care at home, suspicion for acute bacterial sinusitis is low   The patient improved significantly and was discharged in stable condition. Detailed discussions were had with the patient/guardian regarding current findings, and need for close f/u with PCP or on call doctor. The patient/guardian has been instructed to return immediately if the symptoms  worsen in any way for re-evaluation. Patient/guardian verbalized understanding and is in agreement with current care plan. All questions answered prior to discharge.                Additional history obtained: -Additional history obtained from family -External records from outside source obtained and reviewed including: Chart review including previous notes, labs, imaging, consultation notes including  Medications, prior labs and imaging, prior urgent care documentation   Lab Tests: -I ordered, reviewed, and interpreted labs.   The pertinent results include:   Labs Reviewed  CBC WITH DIFFERENTIAL/PLATELET - Abnormal; Notable for the following components:      Result Value   WBC 12.8 (*)    RBC 3.66 (*)    Hemoglobin 11.6 (*)    HCT 34.5 (*)    Platelets 407 (*)    Neutro Abs 9.0 (*)    Abs Immature Granulocytes 0.10 (*)    All other components within normal limits  BASIC METABOLIC PANEL - Abnormal; Notable for the following components:   Potassium 3.4 (*)    Glucose, Bld 132 (*)    BUN 26 (*)    All other components within normal limits  MAGNESIUM    Notable for k mild low, mild leukocytosis  EKG   EKG Interpretation Date/Time:  Sunday October 11 2023 19:42:35 EST Ventricular Rate:  105 PR Interval:  138 QRS Duration:  145 QT Interval:  358 QTC Calculation: 395 R Axis:   -66  Text Interpretation: Sinus tachycardia Supraventricular bigeminy RBBB and LAFB Left ventricular hypertrophy bigeminy is new from prior Confirmed by Tanda Rockers ([id number]) on 10/11/2023 8:01:50 PM         Imaging Studies ordered: na   Medicines ordered and prescription drug management: Meds ordered this encounter  Medications   potassium chloride SA (KLOR-CON M) CR tablet 40 mEq    -  I have reviewed the patients home medicines and have made adjustments as needed   Consultations Obtained: na   Cardiac Monitoring: The patient was maintained on a cardiac monitor.  I personally  viewed and interpreted the cardiac monitored which showed an underlying rhythm of: bigeminy Continuous pulse oximetry interpreted by myself, 98% on RA.    Social Determinants of Health:  Diagnosis or treatment significantly limited by social determinants of health: na   Reevaluation: After the interventions noted above, I reevaluated the patient and found that they have improved  Co morbidities that complicate the patient evaluation  Past Medical History:  Diagnosis Date   Acute myeloid leukemia in remission (HCC) 1999   Anemia    Arthritis    Depression    Diverticulosis of colon (without mention of hemorrhage)    Diverticulosis of colon (without mention of hemorrhage) 04/21/2011   Esophageal reflux    Family hx colonic polyps    Family hx colonic polyps 04/21/2011   Hiatal hernia    Hiatal hernia 04/21/2011   History of kidney stones    Hyperlipemia    Hypertension    MDS (myelodysplastic syndrome) (HCC)    Psychosexual dysfunction with inhibited sexual excitement       Dispostion: Disposition decision including need for hospitalization was considered, and patient discharged from emergency department.    Final Clinical Impression(s) / ED Diagnoses Final diagnoses:  Ventricular bigeminy seen on cardiac monitor  Hypokalemia  Sinus pressure        Sloan Leiter, DO 10/11/23 2220

## 2023-10-11 NOTE — Discharge Instructions (Addendum)
 It was a pleasure caring for you today in the emergency department.  Recommend you follow-up with cardiology to see if medications need to be adjusted and for recheck of your symptoms.  Please return to the emergency department for any worsening or worrisome symptoms.

## 2023-10-11 NOTE — ED Triage Notes (Signed)
 Reported low HR (40s) today Feeling more tired/fatigue  Recently had rsv

## 2023-10-13 ENCOUNTER — Ambulatory Visit: Payer: Self-pay | Admitting: Family Medicine

## 2023-10-13 ENCOUNTER — Ambulatory Visit (INDEPENDENT_AMBULATORY_CARE_PROVIDER_SITE_OTHER): Admitting: Physician Assistant

## 2023-10-13 ENCOUNTER — Encounter: Payer: Self-pay | Admitting: Physician Assistant

## 2023-10-13 VITALS — BP 130/86 | HR 75 | Temp 97.0°F | Ht 70.0 in | Wt 173.2 lb

## 2023-10-13 DIAGNOSIS — H608X1 Other otitis externa, right ear: Secondary | ICD-10-CM | POA: Diagnosis not present

## 2023-10-13 DIAGNOSIS — J0111 Acute recurrent frontal sinusitis: Secondary | ICD-10-CM | POA: Diagnosis not present

## 2023-10-13 DIAGNOSIS — I498 Other specified cardiac arrhythmias: Secondary | ICD-10-CM

## 2023-10-13 MED ORDER — CIPROFLOXACIN-DEXAMETHASONE 0.3-0.1 % OT SUSP: 4.0000 [drp] | Freq: Two times a day (BID) | OTIC | 0 refills | Status: AC

## 2023-10-13 MED ORDER — DOXYCYCLINE HYCLATE 100 MG PO TABS: 100.0000 mg | ORAL_TABLET | Freq: Two times a day (BID) | ORAL | 0 refills | Status: DC

## 2023-10-13 NOTE — Telephone Encounter (Signed)
 Copied from CRM [id number]. Topic: Clinical - Red Word Triage >> Oct 13, 2023  8:03 AM [name] wrote: Red Word that prompted transfer to Nurse Triage: Discolored mucus with cough for the last 4 weeks.

## 2023-10-13 NOTE — Telephone Encounter (Signed)
 Noted, appt today.

## 2023-10-13 NOTE — Telephone Encounter (Signed)
 Called pt back no answer; left voice mail.

## 2023-10-13 NOTE — Telephone Encounter (Signed)
  Chief Complaint: productive cough Symptoms: productive cough with green mucus, hoarse voice, sinus pressure and congestion, body aches, bilateral ear drainage,  Frequency: since 09/30/23 Pertinent Negatives: Patient denies chest pain, difficulty breathing, chills Disposition: [] ED /[] Urgent Care (no appt availability in office) / [x] Appointment(In office/virtual)/ []  [facility]/ [] Home Care/ [] Refused Recommended Disposition /[] [facility]/ []  Follow-up with PCP Additional Notes: Patient states he is currently taking Sudafed and Tylenol during the day, which has been helping. Patient states he was seen at urgent care and placed on steroid for 5 days which improved symptoms then they worsened. Patient state he was scheduled with PCP for later this week but would prefer to be seen today. Patient scheduled with PA Jarold Motto for today. Advised patient to wear a mask.  Copied from CRM [id number]. Topic: Clinical - Red Word Triage >> Oct 13, 2023  8:03 AM Theodis Sato wrote: Red Word that prompted transfer to Nurse Triage: Discolored mucus with cough for the last 4 weeks.   Reason for Disposition  Ear[place]  Answer Assessment - Initial Assessment Questions 1. ONSET: "When did the cough begin?"      09/30/23.  2. SEVERITY: "How bad is the cough today?"      Patient states it has gotten a little bit better but it has started to affect his voice. States it is not worse.  3. SPUTUM: "Describe the color of your sputum" (none, dry cough; clear, white, yellow, green)     Dark green.  4. HEMOPTYSIS: "Are you coughing up any blood?" If so ask: "How much?" (flecks, streaks, tablespoons, etc.)     Denies.  5. DIFFICULTY BREATHING: "Are you having difficulty breathing?" If Yes, ask: "How bad is it?" (e.g., mild, moderate, severe)    - MILD: No SOB at rest, mild SOB with walking, speaks normally in sentences, can lie down, no retractions, pulse < 100.    - MODERATE: SOB at rest, SOB  with minimal exertion and prefers to sit, cannot lie down flat, speaks in phrases, mild retractions, audible wheezing, pulse 100-120.    - SEVERE: Very SOB at rest, speaks in single words, struggling to breathe, sitting hunched forward, retractions, pulse > 120      Denies.  6. FEVER: "Do you have a fever?" If Yes, ask: "What is your temperature, how was it measured, and when did it start?"     States the highest his temperature has gotten is 99 to 100 at night.  7. CARDIAC HISTORY: "Do you have any history of heart disease?" (e.g., heart attack, congestive heart failure)      Low EF.  8. LUNG HISTORY: "Do you have any history of lung disease?"  (e.g., pulmonary embolus, asthma, emphysema)     Denies.  9. PE RISK FACTORS: "Do you have a history of blood clots?" (or: recent major surgery, recent prolonged travel, bedridden)     Denies.  10. OTHER SYMPTOMS: "Do you have any other symptoms?" (e.g., runny nose, wheezing, chest pain)       Sinus congestion and headache/pressure, bilateral ear drainage "really bad at night"  11. TRAVEL: "Have you traveled out of the country in the last month?" (e.g., travel history, exposures)       Denies any travel out of the country, exposure to RSV on 09/30/23 from granddaughter.  Protocols used: Cough - Acute Productive-A-AH

## 2023-10-13 NOTE — Telephone Encounter (Signed)
 Call was disconnected: will attempt to call pt back.

## 2023-10-13 NOTE — Progress Notes (Signed)
 Patient ID: [name], male    DOB: [birth year], 68 y.o.   MRN: [id number]   Assessment & Plan:  Acute recurrent frontal sinusitis  Actinic otitis externa of right ear, unspecified chronicity  Ventricular bigeminy  Other orders -     Doxycycline Hyclate; Take 1 tablet (100 mg total) by mouth 2 (two) times daily.  Dispense: 14 tablet; Refill: 0 -     Ciprofloxacin-dexAMETHasone; Place 4 drops into both ears 2 (two) times daily for 5 days.  Dispense: 7.5 mL; Refill: 0    Assessment and Plan    Sinusitis Symptoms suggest sinus congestion with possible bacterial involvement. Doxycycline chosen due to history of multiple antibiotic courses and intolerance to Augmentin. - Prescribe doxycycline. - Continue Sudafed for up to five days, monitor blood pressure. - Use Flonase and saline spray as directed.  Otitis Externa Soreness and redness in the right ear canal indicate otitis externa. Ear drops are safe with tubes. - Prescribe ear drops (Ciprodex) for both ears, four drops twice a day for at least five days.  Cardiac Arrhythmia (Bigeminy) Diagnosed with bigeminy, currently asymptomatic. Normal rhythm on exam today. Cardiology follow-up scheduled. - Follow up with cardiologist on Monday.        Subjective:    Chief Complaint  Patient presents with   Cough    Pt in office c/o sinus pressure, cough, congestion and Bil ear drainage; pt symptoms started Feb 19th, and improved after taking a Z pack and round of prednisone but now worse again;     HPI Discussed the use of AI scribe software for clinical note transcription with the patient, who gave verbal consent to proceed.  History of Present Illness   DEMARRIUS [name] "[name]" is a 68 year old male who presents with worsening sinus symptoms and ear pain.  He has been experiencing worsening sinus symptoms and ear pain since mid-February. Initially, he was treated with azithromycin and prednisone, but his symptoms have  worsened over the past week. He has significant nocturnal drainage affecting his hearing and increased head pain over the last two days. He is concerned about a potential sinus infection.  He has a history of ear surgery two years ago, which involved cleaning out his ear and placing tubes. He is worried about the possibility of needing another surgery. He also has a history of septoplasty but notes that his ENT has not suggested repeating the procedure. He has undergone two bone marrow transplants, making him more susceptible to infections, often requiring multiple rounds of antibiotics.  Recently, his watch indicated a low pulse of 49, leading to an ER visit where he was diagnosed with bigeminy. He is scheduled for a cardiology follow-up soon. No significant chest symptoms, but he occasionally coughs up phlegm.  He uses Flonase daily and saline spray in the morning for allergies, and he receives allergy shots. His allergies are generally well-managed. He has been using Sudafed for the past two days, which helps with drainage, but he avoids it at night to prevent sleep disturbances. He used to tolerate Augmentin well, but now it causes diarrhea. He has been on various antibiotics due to his medical history.       Past Medical History:  Diagnosis Date   Acute myeloid leukemia in remission (HCC) 1999   Anemia    Arthritis    Depression    Diverticulosis of colon (without mention of hemorrhage)    Diverticulosis of colon (without mention of hemorrhage) 04/21/2011  Esophageal reflux    Family hx colonic polyps    Family hx colonic polyps 04/21/2011   Hiatal hernia    Hiatal hernia 04/21/2011   History of kidney stones    Hyperlipemia    Hypertension    MDS (myelodysplastic syndrome) (HCC)    Psychosexual dysfunction with inhibited sexual excitement     Past Surgical History:  Procedure Laterality Date   EAR EXAMINATION UNDER ANESTHESIA Right 10/29/2021   middle ear cleaned out infection,  with an inplanted ear tube   KNEE SURGERY Left 08/14/2021   [facility]   SHOULDER SURGERY     x 3     Family History  Problem Relation Age of Onset   Hypertension Father    Heart disease Father    Prostate cancer Father        dx late 54s   Cancer Father 69       prostate ca   Celiac disease Mother    Colon polyps Mother    Breast cancer Mother    Cancer Mother        Bladder   Colon cancer Neg Hx    Esophageal cancer Neg Hx    Inflammatory bowel disease Neg Hx    Liver disease Neg Hx    Pancreatic cancer Neg Hx    Rectal cancer Neg Hx    Stomach cancer Neg Hx     Social History   Tobacco Use   Smoking status: Never   Smokeless tobacco: Never  Vaping Use   Vaping status: Never Used  Substance Use Topics   Alcohol use: Yes    Comment: one occ    Drug use: No     Allergies  Allergen Reactions   Morphine Other (See Comments)    UNKNOWN - REACTION IS NOT LISTED "Makes me crazy"   Marinol [Dronabinol]     Delusions.    Review of Systems NEGATIVE UNLESS OTHERWISE INDICATED IN HPI      Objective:     BP 130/86 (BP Location: Left Arm, Patient Position: Sitting)   Pulse 75   Temp (!) 97 F (36.1 C) (Temporal)   Ht 5\' 10"  (1.778 m)   Wt 173 lb 3.2 oz (78.6 kg)   SpO2 96%   BMI 24.85 kg/m   Wt Readings from Last 3 Encounters:  10/13/23 173 lb 3.2 oz (78.6 kg)  07/23/23 171 lb 6.4 oz (77.7 kg)  06/02/23 174 lb 6.4 oz (79.1 kg)    BP Readings from Last 3 Encounters:  10/13/23 130/86  10/11/23 (!) 143/71  10/05/23 127/76     Physical Exam Vitals and nursing note reviewed.  Constitutional:      General: He is not in acute distress.    Appearance: Normal appearance. He is not ill-appearing.  HENT:     Head: Normocephalic.     Right Ear: Tympanic membrane normal. A PE tube is present.     Left Ear: Tympanic membrane, ear canal and external ear normal. A PE tube is present.     Ears:     Comments: R ear canal noted to be erythematous;  discharge in canal    Nose: Congestion present.     Mouth/Throat:     Mouth: Mucous membranes are moist.     Pharynx: No oropharyngeal exudate or posterior oropharyngeal erythema.  Eyes:     Extraocular Movements: Extraocular movements intact.     Conjunctiva/sclera: Conjunctivae normal.     Pupils: Pupils are equal, round,  and reactive to light.  Cardiovascular:     Rate and Rhythm: Normal rate and regular rhythm.     Pulses: Normal pulses.     Heart sounds: Normal heart sounds. No murmur heard. Pulmonary:     Effort: Pulmonary effort is normal. No respiratory distress.     Breath sounds: Normal breath sounds. No wheezing.  Musculoskeletal:     Cervical back: Normal range of motion.  Skin:    General: Skin is warm.  Neurological:     Mental Status: He is alert and oriented to person, place, and time.  Psychiatric:        Mood and Affect: Mood normal.        Behavior: Behavior normal.       Maryclare Nydam M Kaleesi Guyton, PA-C

## 2023-10-15 ENCOUNTER — Ambulatory Visit: Admitting: Family Medicine

## 2023-10-19 ENCOUNTER — Ambulatory Visit (INDEPENDENT_AMBULATORY_CARE_PROVIDER_SITE_OTHER)

## 2023-10-19 ENCOUNTER — Encounter: Payer: Self-pay | Admitting: Cardiovascular Disease

## 2023-10-19 ENCOUNTER — Other Ambulatory Visit: Payer: Self-pay | Admitting: Cardiovascular Disease

## 2023-10-19 ENCOUNTER — Ambulatory Visit: Payer: Medicare Other | Attending: Cardiovascular Disease | Admitting: Cardiovascular Disease

## 2023-10-19 VITALS — BP 120/84 | HR 75 | Ht 70.0 in | Wt 170.0 lb

## 2023-10-19 DIAGNOSIS — I493 Ventricular premature depolarization: Secondary | ICD-10-CM

## 2023-10-19 DIAGNOSIS — I5022 Chronic systolic (congestive) heart failure: Secondary | ICD-10-CM

## 2023-10-19 DIAGNOSIS — I10 Essential (primary) hypertension: Secondary | ICD-10-CM

## 2023-10-19 NOTE — Progress Notes (Unsigned)
 Enrolled patient for a 3 day Zio XT monitor to be mailed to patients home

## 2023-10-19 NOTE — Patient Instructions (Addendum)
 Medication Instructions:  Your physician recommends that you continue on your current medications as directed. Please refer to the Current Medication list given to you today.  *If you need a refill on your cardiac medications before your next appointment, please call your pharmacy*  Lab Work: If you have labs (blood work) drawn today and your tests are completely normal, you will receive your results only by: MyChart Message (if you have MyChart) OR A paper copy in the mail If you have any lab test that is abnormal or we need to change your treatment, we will call you to review the results.  Testing/Procedures: Your physician has recommended that you wear an 3 day monitor. Monitors are medical devices that record the heart's electrical activity. Doctors most often [place] these monitors to diagnose arrhythmias. Arrhythmias are problems with the speed or rhythm of the heartbeat. The monitor is a small, portable device. You can wear one while you do your normal daily activities. This is usually used to diagnose what is causing palpitations/syncope (passing out).  Your physician has requested that you have an echocardiogram. Echocardiography is a painless test that uses sound waves to create images of your heart. It provides your doctor with information about the size and shape of your heart and how well your heart's chambers and valves are working. This procedure takes approximately one hour. There are no restrictions for this procedure. Please do NOT wear cologne, perfume, aftershave, or lotions (deodorant is allowed). Please arrive 15 minutes prior to your appointment time.  Please note: We ask at that you not bring children with you during ultrasound (echo/ vascular) testing. Due to room size and safety concerns, children are not allowed in the ultrasound rooms during exams. Our front office staff cannot provide observation of children in our lobby area while testing is being conducted. An adult  accompanying a patient to their appointment will only be allowed in the ultrasound room at the discretion of the ultrasound technician under special circumstances. We apologize for any inconvenience. Follow-Up: At [facility], you and your health needs are our priority.  As part of our continuing mission to provide you with exceptional heart care, we have created designated Provider Care Teams.  These Care Teams include your primary Cardiologist (physician) and Advanced Practice Providers (APPs -  Physician Assistants and Nurse Practitioners) who all work together to provide you with the care you need, when you need it.  We recommend signing up for the patient portal called "MyChart".  Sign up information is provided on this After Visit Summary.  MyChart is used to connect with patients for Virtual Visits (Telemedicine).  Patients are able to view lab/test results, encounter notes, upcoming appointments, etc.  Non-urgent messages can be sent to your provider as well.   To learn more about what you can do with MyChart, go to [email or web address].    Your next appointment:   6 months   Provider:   Charlton Haws, MD     Other Instructions       1st Floor: - Lobby - Registration  - Pharmacy  - Lab - Cafe  2nd Floor: - PV Lab - Diagnostic Testing (echo, CT, nuclear med)  3rd Floor: - Vacant  4th Floor: - TCTS (cardiothoracic surgery) - AFib Clinic - Structural Heart Clinic - Vascular Surgery  - Vascular Ultrasound  5th Floor: - [facility] (general and EP) - Clinical Pharmacy for coumadin, hypertension, lipid, weight-loss medications, and med management appointments    Valet parking  services will be available as well.

## 2023-10-22 DIAGNOSIS — I493 Ventricular premature depolarization: Secondary | ICD-10-CM

## 2023-10-22 DIAGNOSIS — I10 Essential (primary) hypertension: Secondary | ICD-10-CM | POA: Diagnosis not present

## 2023-10-22 DIAGNOSIS — I5022 Chronic systolic (congestive) heart failure: Secondary | ICD-10-CM | POA: Diagnosis not present

## 2023-11-08 ENCOUNTER — Other Ambulatory Visit: Payer: Self-pay

## 2023-11-08 ENCOUNTER — Inpatient Hospital Stay (HOSPITAL_COMMUNITY)

## 2023-11-08 ENCOUNTER — Emergency Department (HOSPITAL_BASED_OUTPATIENT_CLINIC_OR_DEPARTMENT_OTHER)

## 2023-11-08 ENCOUNTER — Encounter (HOSPITAL_BASED_OUTPATIENT_CLINIC_OR_DEPARTMENT_OTHER): Payer: Self-pay

## 2023-11-08 ENCOUNTER — Inpatient Hospital Stay (HOSPITAL_BASED_OUTPATIENT_CLINIC_OR_DEPARTMENT_OTHER)
Admission: EM | Admit: 2023-11-08 | Discharge: 2023-11-11 | DRG: 286 | Disposition: A | Discharge status: Home / Self Care | Attending: Internal Medicine | Admitting: Internal Medicine

## 2023-11-08 DIAGNOSIS — I428 Other cardiomyopathies: Secondary | ICD-10-CM | POA: Diagnosis present

## 2023-11-08 DIAGNOSIS — Z87442 Personal history of urinary calculi: Secondary | ICD-10-CM

## 2023-11-08 DIAGNOSIS — K219 Gastro-esophageal reflux disease without esophagitis: Secondary | ICD-10-CM | POA: Diagnosis present

## 2023-11-08 DIAGNOSIS — I471 Supraventricular tachycardia, unspecified: Secondary | ICD-10-CM | POA: Diagnosis not present

## 2023-11-08 DIAGNOSIS — R1013 Epigastric pain: Principal | ICD-10-CM

## 2023-11-08 DIAGNOSIS — R7989 Other specified abnormal findings of blood chemistry: Secondary | ICD-10-CM | POA: Insufficient documentation

## 2023-11-08 DIAGNOSIS — I4892 Unspecified atrial flutter: Secondary | ICD-10-CM | POA: Diagnosis present

## 2023-11-08 DIAGNOSIS — R0609 Other forms of dyspnea: Secondary | ICD-10-CM

## 2023-11-08 DIAGNOSIS — I4891 Unspecified atrial fibrillation: Secondary | ICD-10-CM | POA: Diagnosis present

## 2023-11-08 DIAGNOSIS — Z7984 Long term (current) use of oral hypoglycemic drugs: Secondary | ICD-10-CM

## 2023-11-08 DIAGNOSIS — I130 Hypertensive heart and chronic kidney disease with heart failure and stage 1 through stage 4 chronic kidney disease, or unspecified chronic kidney disease: Principal | ICD-10-CM | POA: Diagnosis present

## 2023-11-08 DIAGNOSIS — I5023 Acute on chronic systolic (congestive) heart failure: Secondary | ICD-10-CM | POA: Diagnosis present

## 2023-11-08 DIAGNOSIS — I2510 Atherosclerotic heart disease of native coronary artery without angina pectoris: Secondary | ICD-10-CM | POA: Diagnosis present

## 2023-11-08 DIAGNOSIS — F418 Other specified anxiety disorders: Secondary | ICD-10-CM | POA: Diagnosis not present

## 2023-11-08 DIAGNOSIS — I509 Heart failure, unspecified: Secondary | ICD-10-CM | POA: Diagnosis not present

## 2023-11-08 DIAGNOSIS — Z885 Allergy status to narcotic agent status: Secondary | ICD-10-CM

## 2023-11-08 DIAGNOSIS — I493 Ventricular premature depolarization: Secondary | ICD-10-CM | POA: Diagnosis present

## 2023-11-08 DIAGNOSIS — K449 Diaphragmatic hernia without obstruction or gangrene: Secondary | ICD-10-CM | POA: Diagnosis present

## 2023-11-08 DIAGNOSIS — Z8249 Family history of ischemic heart disease and other diseases of the circulatory system: Secondary | ICD-10-CM | POA: Diagnosis not present

## 2023-11-08 DIAGNOSIS — Z9481 Bone marrow transplant status: Secondary | ICD-10-CM

## 2023-11-08 DIAGNOSIS — I080 Rheumatic disorders of both mitral and aortic valves: Secondary | ICD-10-CM | POA: Diagnosis present

## 2023-11-08 DIAGNOSIS — I2489 Other forms of acute ischemic heart disease: Secondary | ICD-10-CM | POA: Diagnosis present

## 2023-11-08 DIAGNOSIS — Z803 Family history of malignant neoplasm of breast: Secondary | ICD-10-CM

## 2023-11-08 DIAGNOSIS — I4719 Other supraventricular tachycardia: Secondary | ICD-10-CM | POA: Diagnosis present

## 2023-11-08 DIAGNOSIS — Z791 Long term (current) use of non-steroidal anti-inflammatories (NSAID): Secondary | ICD-10-CM

## 2023-11-08 DIAGNOSIS — E785 Hyperlipidemia, unspecified: Secondary | ICD-10-CM | POA: Diagnosis present

## 2023-11-08 DIAGNOSIS — C9201 Acute myeloblastic leukemia, in remission: Secondary | ICD-10-CM | POA: Diagnosis present

## 2023-11-08 DIAGNOSIS — Z79899 Other long term (current) drug therapy: Secondary | ICD-10-CM | POA: Diagnosis not present

## 2023-11-08 DIAGNOSIS — I110 Hypertensive heart disease with heart failure: Secondary | ICD-10-CM | POA: Diagnosis not present

## 2023-11-08 DIAGNOSIS — I452 Bifascicular block: Secondary | ICD-10-CM | POA: Diagnosis present

## 2023-11-08 DIAGNOSIS — N182 Chronic kidney disease, stage 2 (mild): Secondary | ICD-10-CM | POA: Diagnosis present

## 2023-11-08 DIAGNOSIS — Z8379 Family history of other diseases of the digestive system: Secondary | ICD-10-CM

## 2023-11-08 DIAGNOSIS — Z888 Allergy status to other drugs, medicaments and biological substances status: Secondary | ICD-10-CM

## 2023-11-08 DIAGNOSIS — I10 Essential (primary) hypertension: Secondary | ICD-10-CM | POA: Diagnosis not present

## 2023-11-08 DIAGNOSIS — I340 Nonrheumatic mitral (valve) insufficiency: Secondary | ICD-10-CM | POA: Diagnosis not present

## 2023-11-08 DIAGNOSIS — Z83719 Family history of colon polyps, unspecified: Secondary | ICD-10-CM

## 2023-11-08 DIAGNOSIS — E876 Hypokalemia: Secondary | ICD-10-CM | POA: Diagnosis present

## 2023-11-08 DIAGNOSIS — Z8042 Family history of malignant neoplasm of prostate: Secondary | ICD-10-CM | POA: Diagnosis not present

## 2023-11-08 LAB — COMPREHENSIVE METABOLIC PANEL WITH GFR
ALT: 60 U/L — ABNORMAL HIGH (ref 0–44)
AST: 40 U/L (ref 15–41)
Albumin: 3.9 g/dL (ref 3.5–5.0)
Alkaline Phosphatase: 67 U/L (ref 38–126)
Anion gap: 11 (ref 5–15)
BUN: 36 mg/dL — ABNORMAL HIGH (ref 8–23)
CO2: 22 mmol/L (ref 22–32)
Calcium: 8.7 mg/dL — ABNORMAL LOW (ref 8.9–10.3)
Chloride: 106 mmol/L (ref 98–111)
Creatinine, Ser: 1.31 mg/dL — ABNORMAL HIGH (ref 0.61–1.24)
GFR, Estimated: 59 mL/min — ABNORMAL LOW (ref >60)
Glucose, Bld: 111 mg/dL — ABNORMAL HIGH (ref 70–99)
Potassium: 3.5 mmol/L (ref 3.5–5.1)
Sodium: 139 mmol/L (ref 135–145)
Total Bilirubin: 0.4 mg/dL (ref 0.0–1.2)
Total Protein: 6.6 g/dL (ref 6.5–8.1)

## 2023-11-08 LAB — ECHOCARDIOGRAM COMPLETE
AR max vel: 2.75 cm2
AV Peak grad: 3.8 mmHg
Ao pk vel: 0.98 m/s
Area-P 1/2: 4.49 cm2
Calc EF: 33.2 %
Height: 70 in
MV M vel: 4.52 m/s
MV Peak grad: 81.8 mmHg
Radius: 0.78 cm
S' Lateral: 4.47 cm
Single Plane A2C EF: 33.8 %
Single Plane A4C EF: 32.8 %
Weight: 2819.2 [oz_av]

## 2023-11-08 LAB — CBC
HCT: 34.2 % — ABNORMAL LOW (ref 39.0–52.0)
HCT: 32.1 % — ABNORMAL LOW (ref 39.0–52.0)
Hemoglobin: 11.2 g/dL — ABNORMAL LOW (ref 13.0–17.0)
Hemoglobin: 10.6 g/dL — ABNORMAL LOW (ref 13.0–17.0)
MCH: 30.9 pg (ref 26.0–34.0)
MCH: 31.4 pg (ref 26.0–34.0)
MCHC: 32.7 g/dL (ref 30.0–36.0)
MCHC: 33 g/dL (ref 30.0–36.0)
MCV: 94.5 fL (ref 80.0–100.0)
MCV: 95 fL (ref 80.0–100.0)
Platelets: 267 10*3/uL (ref 150–400)
Platelets: 268 10*3/uL (ref 150–400)
RBC: 3.62 MIL/uL — ABNORMAL LOW (ref 4.22–5.81)
RBC: 3.38 MIL/uL — ABNORMAL LOW (ref 4.22–5.81)
RDW: 13.2 % (ref 11.5–15.5)
RDW: 13.2 % (ref 11.5–15.5)
WBC: 6 10*3/uL (ref 4.0–10.5)
WBC: 5 10*3/uL (ref 4.0–10.5)
nRBC: 0 % (ref 0.0–0.2)
nRBC: 0 % (ref 0.0–0.2)

## 2023-11-08 LAB — CREATININE, SERUM
Creatinine, Ser: 1.31 mg/dL — ABNORMAL HIGH (ref 0.61–1.24)
GFR, Estimated: 59 mL/min — ABNORMAL LOW (ref >60)

## 2023-11-08 LAB — LIPASE, BLOOD: Lipase: 27 U/L (ref 11–51)

## 2023-11-08 LAB — TROPONIN I (HIGH SENSITIVITY)
Troponin I (High Sensitivity): 65 ng/L — ABNORMAL HIGH (ref <18)
Troponin I (High Sensitivity): 49 ng/L — ABNORMAL HIGH (ref <18)

## 2023-11-08 LAB — HIV ANTIBODY (ROUTINE TESTING W REFLEX): HIV Screen 4th Generation wRfx: NONREACTIVE

## 2023-11-08 LAB — HEMOGLOBIN AND HEMATOCRIT, BLOOD
HCT: 39 % (ref 39.0–52.0)
Hemoglobin: 12.9 g/dL — ABNORMAL LOW (ref 13.0–17.0)

## 2023-11-08 LAB — BRAIN NATRIURETIC PEPTIDE: B Natriuretic Peptide: 2996.9 pg/mL — ABNORMAL HIGH (ref 0.0–100.0)

## 2023-11-08 MED ORDER — ENOXAPARIN SODIUM 40 MG/0.4ML IJ SOSY: 40.0000 mg | PREFILLED_SYRINGE | INTRAMUSCULAR | Status: DC

## 2023-11-08 MED ORDER — MELATONIN 5 MG PO TABS
5.0000 mg | ORAL_TABLET | Freq: Every evening | ORAL | Status: DC | PRN
Start: 2023-11-08 — End: 2023-11-11
  Administered 2023-11-08 – 2023-11-10 (×3): 5 mg via ORAL
  Filled 2023-11-08 (×3): qty 1

## 2023-11-08 MED ORDER — KETOROLAC TROMETHAMINE 15 MG/ML IJ SOLN
15.0000 mg | Freq: Once | INTRAMUSCULAR | Status: AC
  Administered 2023-11-08: 15 mg via INTRAVENOUS
  Filled 2023-11-08: qty 1

## 2023-11-08 MED ORDER — SERTRALINE HCL 50 MG PO TABS
50.0000 mg | ORAL_TABLET | Freq: Every day | ORAL | Status: DC
  Administered 2023-11-08 – 2023-11-11 (×4): 50 mg via ORAL
  Filled 2023-11-08 (×4): qty 1

## 2023-11-08 MED ORDER — IOHEXOL 300 MG/ML  SOLN
100.0000 mL | Freq: Once | INTRAMUSCULAR | Status: AC | PRN
  Administered 2023-11-08: 100 mL via INTRAVENOUS

## 2023-11-08 MED ORDER — MONTELUKAST SODIUM 10 MG PO TABS
10.0000 mg | ORAL_TABLET | Freq: Every day | ORAL | Status: DC
  Filled 2023-11-08 (×4): qty 1

## 2023-11-08 MED ORDER — VALACYCLOVIR HCL 500 MG PO TABS
500.0000 mg | ORAL_TABLET | Freq: Two times a day (BID) | ORAL | Status: DC
  Administered 2023-11-08 – 2023-11-11 (×7): 500 mg via ORAL
  Filled 2023-11-08 (×8): qty 1

## 2023-11-08 MED ORDER — ACETAMINOPHEN 650 MG RE SUPP: 650.0000 mg | Freq: Four times a day (QID) | RECTAL | Status: DC | PRN

## 2023-11-08 MED ORDER — SODIUM CHLORIDE 0.9 % IV BOLUS
1000.0000 mL | Freq: Once | INTRAVENOUS | Status: AC
  Administered 2023-11-08: 1000 mL via INTRAVENOUS

## 2023-11-08 MED ORDER — LORATADINE 10 MG PO TABS
10.0000 mg | ORAL_TABLET | Freq: Every day | ORAL | Status: DC
  Administered 2023-11-08 – 2023-11-11 (×4): 10 mg via ORAL
  Filled 2023-11-08 (×4): qty 1

## 2023-11-08 MED ORDER — EMPAGLIFLOZIN 10 MG PO TABS
10.0000 mg | ORAL_TABLET | Freq: Every day | ORAL | Status: DC
Start: 2023-11-08 — End: 2023-11-10
  Administered 2023-11-08 – 2023-11-09 (×2): 10 mg via ORAL
  Filled 2023-11-08 (×3): qty 1

## 2023-11-08 MED ORDER — SACUBITRIL-VALSARTAN 97-103 MG PO TABS
1.0000 | ORAL_TABLET | Freq: Two times a day (BID) | ORAL | Status: DC
  Administered 2023-11-08 – 2023-11-09 (×4): 1 via ORAL
  Filled 2023-11-08 (×5): qty 1

## 2023-11-08 MED ORDER — PANTOPRAZOLE SODIUM 40 MG PO TBEC
40.0000 mg | DELAYED_RELEASE_TABLET | Freq: Every day | ORAL | Status: DC
  Administered 2023-11-08: 40 mg via ORAL
  Filled 2023-11-08: qty 1

## 2023-11-08 MED ORDER — PRAVASTATIN SODIUM 10 MG PO TABS
20.0000 mg | ORAL_TABLET | Freq: Every day | ORAL | Status: DC
  Administered 2023-11-08 – 2023-11-10 (×3): 20 mg via ORAL
  Filled 2023-11-08 (×3): qty 2

## 2023-11-08 MED ORDER — ACETAMINOPHEN 325 MG PO TABS: 650.0000 mg | ORAL_TABLET | Freq: Four times a day (QID) | ORAL | Status: DC | PRN

## 2023-11-08 MED ORDER — METOPROLOL SUCCINATE ER 25 MG PO TB24
25.0000 mg | ORAL_TABLET | Freq: Every day | ORAL | Status: DC
  Administered 2023-11-08 – 2023-11-09 (×2): 25 mg via ORAL
  Filled 2023-11-08 (×3): qty 1

## 2023-11-08 MED ORDER — ONDANSETRON HCL 4 MG/2ML IJ SOLN: 4.0000 mg | Freq: Once | INTRAMUSCULAR | Status: DC | PRN

## 2023-11-08 MED ORDER — POTASSIUM CHLORIDE CRYS ER 20 MEQ PO TBCR
40.0000 meq | EXTENDED_RELEASE_TABLET | Freq: Once | ORAL | Status: AC
  Administered 2023-11-08: 40 meq via ORAL
  Filled 2023-11-08: qty 2

## 2023-11-08 MED ORDER — FLUTICASONE PROPIONATE 50 MCG/ACT NA SUSP
1.0000 | Freq: Every day | NASAL | Status: DC
  Administered 2023-11-09 – 2023-11-11 (×3): 1 via NASAL
  Filled 2023-11-08 (×2): qty 16

## 2023-11-08 MED ORDER — PANTOPRAZOLE SODIUM 40 MG PO TBEC
40.0000 mg | DELAYED_RELEASE_TABLET | Freq: Two times a day (BID) | ORAL | Status: DC
  Administered 2023-11-08 – 2023-11-11 (×6): 40 mg via ORAL
  Filled 2023-11-08 (×6): qty 1

## 2023-11-08 MED ORDER — GUAIFENESIN-DM 100-10 MG/5ML PO SYRP
5.0000 mL | ORAL_SOLUTION | ORAL | Status: DC | PRN
  Administered 2023-11-08: 5 mL via ORAL
  Filled 2023-11-08: qty 5

## 2023-11-08 MED ORDER — PANTOPRAZOLE SODIUM 40 MG IV SOLR
40.0000 mg | Freq: Once | INTRAVENOUS | Status: AC
  Administered 2023-11-08: 40 mg via INTRAVENOUS
  Filled 2023-11-08: qty 10

## 2023-11-08 MED ORDER — FAMOTIDINE IN NACL 20-0.9 MG/50ML-% IV SOLN
20.0000 mg | Freq: Once | INTRAVENOUS | Status: DC
  Filled 2023-11-08: qty 50

## 2023-11-08 MED ORDER — FUROSEMIDE 10 MG/ML IJ SOLN
40.0000 mg | Freq: Two times a day (BID) | INTRAMUSCULAR | Status: DC
  Administered 2023-11-08 – 2023-11-09 (×3): 40 mg via INTRAVENOUS
  Filled 2023-11-08 (×3): qty 4

## 2023-11-08 NOTE — Progress Notes (Signed)
 Received a page from patient's RN.  Patient is requesting for melatonin for sleep.  Ordered it.

## 2023-11-08 NOTE — Assessment & Plan Note (Addendum)
 Continue blood pressure control with metoprolol.

## 2023-11-08 NOTE — Plan of Care (Signed)
  Problem: Education: Goal: Knowledge of General Education information will improve Description: Including pain rating scale, medication(s)/side effects and non-pharmacologic comfort measures Outcome: Progressing   Problem: Clinical Measurements: Goal: Ability to maintain clinical measurements within normal limits will improve Outcome: Progressing Goal: Will remain free from infection Outcome: Progressing Goal: Respiratory complications will improve Outcome: Progressing Goal: Cardiovascular complication will be avoided Outcome: Progressing   Problem: Elimination: Goal: Will not experience complications related to urinary retention Outcome: Progressing   Problem: Pain Managment: Goal: General experience of comfort will improve and/or be controlled Outcome: Progressing   Problem: Safety: Goal: Ability to remain free from injury will improve Outcome: Progressing   Problem: Skin Integrity: Goal: Risk for impaired skin integrity will decrease Outcome: Progressing

## 2023-11-08 NOTE — Assessment & Plan Note (Signed)
 Continue with pravastatin

## 2023-11-08 NOTE — Progress Notes (Signed)
 Echocardiogram 2D Echocardiogram has been performed.  [name] 11/08/2023, 3:12 PM

## 2023-11-08 NOTE — Assessment & Plan Note (Addendum)
 Echocardiogram with reduced LV systolic function EF 30 to 35%, global hypokinesis, mild dilatated internal cavity, RV systolic function low normal, with RVSP 36,5 mmHg, LA with mild dilatation, moderate to severe mitral regurgitation, posterior leaflet tethering. Left inferior wall and posterior wall are disproportionately severely hypokinetic.   Patient was placed on IV furosemide for diuresis, negative fluid balance was achieved, - 5,862 ml, with significant improvement in his symptoms.   Further work up with left and right heart catheterization.  04/01 mild non obstructive coronary artery disease.  RA 5 RV 32/-2 PACWP 15 mmHg Cardiac output 5,13 and index 2,68 (Fick)   Plan to continue medical therapy with metoprolol and SGLT 2 inh  Hold on Entresot and further RAAS inhibition until follow up as outpatient, renal function and blood pressure.  Diuresis with furosemide 20 mg daily, increase to 40 mg in case of volume overload.   Troponin elevation due to heart failure exacerbation, ruled out for acute coronary syndrome.

## 2023-11-08 NOTE — ED Provider Notes (Signed)
 [facility] Emergency Department Provider Note MRN:  [id number]  Arrival date & time: 11/08/23     Chief Complaint   Abdominal Pain   History of Present Illness   [name] is a 68 y.o. year-old male with a history of hypertension, MDS, AML presenting to the ED with chief complaint of abdominal pain.  Epigastric abdominal pain with malaise for the past couple days.  Was on a golf trip with his friends, played 4 rounds of golf, started feeling unwell during the trip.  Dry cough, describes discomfort in the upper abdomen similar to acid reflux and how with her hernia symptoms he is experienced in the past but much worse than normal.  Tender to palpation.  No lower abdominal pain, no fever, associated with shortness of breath.  Review of Systems  A thorough review of systems was obtained and all systems are negative except as noted in the HPI and PMH.   Patient's Health History    Past Medical History:  Diagnosis Date   Acute myeloid leukemia in remission (HCC) 1999   Anemia    Arthritis    Depression    Diverticulosis of colon (without mention of hemorrhage)    Diverticulosis of colon (without mention of hemorrhage) 04/21/2011   Esophageal reflux    Family hx colonic polyps    Family hx colonic polyps 04/21/2011   Hiatal hernia    Hiatal hernia 04/21/2011   History of kidney stones    Hyperlipemia    Hypertension    MDS (myelodysplastic syndrome) (HCC)    Psychosexual dysfunction with inhibited sexual excitement     Past Surgical History:  Procedure Laterality Date   EAR EXAMINATION UNDER ANESTHESIA Right 10/29/2021   middle ear cleaned out infection, with an inplanted ear tube   KNEE SURGERY Left 08/14/2021   [facility]   SHOULDER SURGERY     x 3     Family History  Problem Relation Age of Onset   Hypertension Father    Heart disease Father    Prostate cancer Father        dx late 15s   Cancer Father 53       prostate ca   Celiac  disease Mother    Colon polyps Mother    Breast cancer Mother    Cancer Mother        Bladder   Colon cancer Neg Hx    Esophageal cancer Neg Hx    Inflammatory bowel disease Neg Hx    Liver disease Neg Hx    Pancreatic cancer Neg Hx    Rectal cancer Neg Hx    Stomach cancer Neg Hx     Social History   Socioeconomic History   Marital status: Married    Spouse name: Not on file   Number of children: 2   Years of education: Not on file   Highest education level: Not on file  Occupational History   Occupation: self employed    Employer: [employer]  Tobacco Use   Smoking status: Never   Smokeless tobacco: Never  Vaping Use   Vaping status: Never Used  Substance and Sexual Activity   Alcohol use: Yes    Comment: one occ    Drug use: No   Sexual activity: Never  Other Topics Concern   Not on file  Social History Narrative   2 caffeine drinks daily    Social Drivers of Corporate investment banker Strain: Low Risk  (  01/20/2023)   Overall Financial Resource Strain (CARDIA)    Difficulty of Paying Living Expenses: Not hard at all  Food Insecurity: Low Risk  (10/22/2023)   Received from [facility]   Hunger Vital Sign    Worried About Running Out of Food in the Last Year: Never true    Ran Out of Food in the Last Year: Never true  Transportation Needs: No Transportation Needs (10/22/2023)   Received from [facility]    In the past 12 months, has lack of reliable transportation kept you from medical appointments, meetings, work or from getting things needed for daily living? : No  Physical Activity: Sufficiently Active (01/20/2023)   Exercise Vital Sign    Days of Exercise per Week: 7 days    Minutes of Exercise per Session: 40 min  Stress: No Stress Concern Present (01/20/2023)   [name] - Occupational Stress Questionnaire    Feeling of Stress : Not at all  Social Connections: Socially Integrated (01/20/2023)   Social  Connection and Isolation Panel [NHANES]    Frequency of Communication with Friends and Family: More than three times a week    Frequency of Social Gatherings with Friends and Family: More than three times a week    Attends Religious Services: More than 4 times per year    Active Member of [employer] or Organizations: Yes    Attends Banker Meetings: 1 to 4 times per year    Marital Status: Married  Catering manager Violence: Not At Risk (01/20/2023)   Humiliation, Afraid, Rape, and Kick questionnaire    Fear of Current or Ex-Partner: No    Emotionally Abused: No    Physically Abused: No    Sexually Abused: No     Physical Exam   Vitals:   11/08/23 0321  BP: (!) 148/108  Pulse: 82  Resp: 20  Temp: 98.5 F (36.9 C)  SpO2: 97%    CONSTITUTIONAL: Well-appearing, NAD NEURO/PSYCH:  Alert and oriented x 3, no focal deficits EYES:  eyes equal and reactive ENT/NECK:  no LAD, no JVD CARDIO: Regular rate, well-perfused, normal S1 and S2 PULM:  CTAB no wheezing or rhonchi GI/GU:  non-distended, moderate epigastric tenderness to palpation MSK/SPINE:  No gross deformities, no edema SKIN:  no rash, atraumatic   *Additional and/or pertinent findings included in MDM below  Diagnostic and Interventional Summary    EKG Interpretation Date/Time:  November 08, 2023 at 03:19:25 Ventricular Rate:   82 PR Interval:   146 QRS Duration:   150 QT Interval:   426 QTC Calculation:  498 R Axis:      Text Interpretation: Right bundle branch block, left anterior fascicular block, sinus rhythm, no significant changes       Labs Reviewed  COMPREHENSIVE METABOLIC PANEL WITH GFR - Abnormal; Notable for the following components:      Result Value   Glucose, Bld 111 (*)    BUN 36 (*)    Creatinine, Ser 1.31 (*)    Calcium 8.7 (*)    ALT 60 (*)    GFR, Estimated 59 (*)    All other components within normal limits  CBC - Abnormal; Notable for the following components:   RBC 3.62 (*)     Hemoglobin 11.2 (*)    HCT 34.2 (*)    All other components within normal limits  TROPONIN I (HIGH SENSITIVITY) - Abnormal; Notable for the following components:   Troponin I (High  Sensitivity) 65 (*)    All other components within normal limits  LIPASE, BLOOD  URINALYSIS, ROUTINE W REFLEX MICROSCOPIC  TROPONIN I (HIGH SENSITIVITY)    CT ABDOMEN PELVIS W CONTRAST  Final Result    DG Chest Port 1 View  Final Result    US Abdomen Limited RUQ (LIVER/GB)    (Results Pending)    Medications  ondansetron (ZOFRAN) injection 4 mg (has no administration in time range)  sodium chloride 0.9 % bolus 1,000 mL (0 mLs Intravenous Stopped 11/08/23 0507)  iohexol (OMNIPAQUE) 300 MG/ML solution 100 mL (100 mLs Intravenous Contrast Given 11/08/23 0347)  ketorolac (TORADOL) 15 MG/ML injection 15 mg (15 mg Intravenous Given 11/08/23 0407)  pantoprazole (PROTONIX) injection 40 mg (40 mg Intravenous Given 11/08/23 0411)     Procedures  /  Critical Care .Critical Care  Performed by: Sabas Sous, MD Authorized by: Sabas Sous, MD   Critical care provider statement:    Critical care time (minutes):  35   Critical care was necessary to treat or prevent imminent or life-threatening deterioration of the following conditions: Concern for possible ACS, epigastric pain with elevated troponin.   Critical care was time spent personally by me on the following activities:  Development of treatment plan with patient or surrogate, discussions with consultants, evaluation of patient's response to treatment, examination of patient, ordering and review of laboratory studies, ordering and review of radiographic studies, ordering and performing treatments and interventions, pulse oximetry, re-evaluation of patient's condition and review of old charts   ED Course and Medical Decision Making  Initial Impression and Ddx Differential diagnosis includes GERD, pancreatitis, biliary colic, cholecystitis, atypical  presentation of ACS  Past medical/surgical history that increases complexity of ED encounter: AML, MDS, CHF  Interpretation of Diagnostics I personally reviewed the EKG and my interpretation is as follows: Sinus rhythm without significant changes from prior  Labs reveal mild elevation in BUN and creatinine, troponin elevation at 65  Patient Reassessment and Ultimate Disposition/Management     Given patient's symptoms and elevated troponin there is need for admission for further evaluation.  CT scan showing some fluid near the gallbladder of unclear significance, will order follow-up right upper quadrant ultrasound.  Patient management required discussion with the following services or consulting groups:  Hospitalist Service  Complexity of Problems Addressed Acute illness or injury that poses threat of life of bodily function  Additional Data Reviewed and Analyzed Further history obtained from: Further history from spouse/family member  Additional Factors Impacting ED Encounter Risk Consideration of hospitalization  Elmer Sow. Pilar Plate, MD [facility] Emergency Medicine [facility] [email or web address]  Final Clinical Impressions(s) / ED Diagnoses     ICD-10-CM   1. Epigastric pain  R10.13     2. Elevated troponin  R79.89       ED Discharge Orders     None        Discharge Instructions Discussed with and Provided to Patient:   Discharge Instructions   None      Sabas Sous, MD 11/08/23 [phone]

## 2023-11-08 NOTE — Assessment & Plan Note (Signed)
 Will continue antiacid therapy Liver US with negative for acute findings.

## 2023-11-08 NOTE — Progress Notes (Signed)
 RN and Press photographer notified by the daughter that pt is spitting up bright red blood. On investigation, pt stated that he spit up blood three times in last 2 hours. He never had blood in his sputum earlier. Sputum looked tenacious reddish brown in appearance. He stated to have lunch earlier which comprised of pitta bread, humus, apple juice, strawberries. Pt stated that he coughs up whenever he has heartburn and this episode is similar to that one but with blood. Dr. Ella Jubilee got notified and received orders to check H & H and administer Robitussin.

## 2023-11-08 NOTE — ED Triage Notes (Signed)
 Pt presents via POV c/o upper abd pain since Thursday. Reports pain is dull in nature. Also associates pain with SOB.   Speaking in full sentences at this time. 02 saturation 96% on RA.

## 2023-11-08 NOTE — Assessment & Plan Note (Signed)
 Stage 2 CKD.  Corrected hypokalemia.  At the time of his discharge his serum cr is 1.30 with K at 4.0 and serum bicarbonate at 24  Na 141    Plan to continue diuresis with furosemide and follow up renal function and electrolytes as outpatient.

## 2023-11-08 NOTE — Assessment & Plan Note (Signed)
 On remission, follow up as outpatient.

## 2023-11-08 NOTE — ED Notes (Signed)
 Patient transported to [facility] via [facility] at this time

## 2023-11-08 NOTE — H&P (Addendum)
 History and Physical    Patient: [name] WUJ:[id number] DOB: Oct 04, 1955 DOA: 11/08/2023 DOS: the patient was seen and examined on 11/08/2023 PCP: Ardith Dark, MD  Patient coming from: Home  Chief Complaint:  Chief Complaint  Patient presents with   Abdominal Pain   HPI: [name] is a 68 y.o. male with medical history significant of AML, anemia, depression, hiatal hernia, hypertension, dyslipidemia, MDS, coronary artery disease and heart failure who presented with chest pain.  Patient has been at his usual stat of health until last night when he woke up from his sleep with upper abdominal pain, 7/10 in intensity dull in nature, with no radiation, no improving or worsening symptoms. No associated diaphoresis or dyspnea.  Because persistent symptoms he came to the hospital for further evaluation.   Patient is physically active, plays golf. Denies any angina symptoms, no PND, orthopnea or lower extremity edema.  He has a smart watch and has detected tachycardia while playing golf up to 160 bpm, not triggering any symptoms.   He follows with Dr. Eden Emms from Cardiology. Last follow up was 10/19/23, noted he had PVC and bigeminy detected in the ED 10/11/23 (visit for bradycardia detected by his smart watch).  His calcium score was no obstructive on CT 11/2020.  Ordered 3 day monitor for possible SVT, resulted with predominant sinus rhythm with one run of SVT lasting 6 beats with max rate of 169 bpm (avg 151 bpm)   At the time of my examination his abdominal pain has improved, but he is having dyspnea on exertion and feeling weak and heavy in his legs.    Review of Systems: As mentioned in the history of present illness. All other systems reviewed and are negative. Past Medical History:  Diagnosis Date   Acute myeloid leukemia in remission (HCC) 1999   Anemia    Arthritis    Depression    Diverticulosis of colon (without mention of hemorrhage)    Diverticulosis of colon  (without mention of hemorrhage) 04/21/2011   Esophageal reflux    Family hx colonic polyps    Family hx colonic polyps 04/21/2011   Hiatal hernia    Hiatal hernia 04/21/2011   History of kidney stones    Hyperlipemia    Hypertension    MDS (myelodysplastic syndrome) (HCC)    Psychosexual dysfunction with inhibited sexual excitement    Past Surgical History:  Procedure Laterality Date   EAR EXAMINATION UNDER ANESTHESIA Right 10/29/2021   middle ear cleaned out infection, with an inplanted ear tube   KNEE SURGERY Left 08/14/2021   [facility]   SHOULDER SURGERY     x 3    Social History:  reports that he has never smoked. He has never used smokeless tobacco. He reports current alcohol use. He reports that he does not use drugs.  Allergies  Allergen Reactions   Morphine Other (See Comments)    UNKNOWN - REACTION IS NOT LISTED "Makes me crazy"   Marinol [Dronabinol]     Delusions.    Family History  Problem Relation Age of Onset   Hypertension Father    Heart disease Father    Prostate cancer Father        dx late 27s   Cancer Father 51       prostate ca   Celiac disease Mother    Colon polyps Mother    Breast cancer Mother    Cancer Mother        Bladder  Colon cancer Neg Hx    Esophageal cancer Neg Hx    Inflammatory bowel disease Neg Hx    Liver disease Neg Hx    Pancreatic cancer Neg Hx    Rectal cancer Neg Hx    Stomach cancer Neg Hx     Prior to Admission medications   Medication Sig Start Date End Date Taking? Authorizing Provider  Ascorbic Acid (VITAMIN C) 500 MG CAPS Take 500 mg by mouth daily.    [provider]  B Complex Vitamins (VITAMIN B COMPLEX) TABS Take by mouth. 07/11/19   [provider]  calcium-vitamin D (OSCAL WITH D) 500-200 MG-UNIT TABS tablet Take by mouth. 07/11/19   [provider]  cetirizine (ZYRTEC) 10 MG tablet Take 1 tablet (10 mg total) by mouth daily. 05/06/23   Ardith Dark, MD  Cyanocobalamin  (VITAMIN B12) 1000 MCG TBCR 1 tablet    [provider]  doxycycline (VIBRA-TABS) 100 MG tablet Take 1 tablet (100 mg total) by mouth 2 (two) times daily. 10/13/23   Allwardt, Crist Infante, PA-C  empagliflozin (JARDIANCE) 10 MG TABS tablet Take 1 tablet (10 mg total) by mouth daily. 10/27/22   Wendall Stade, MD  ENTRESTO 97-103 MG TAKE ONE TABLET BY MOUTH TWICE A DAY 10/02/22   Wendall Stade, MD  EPIPEN 2-PAK 0.3 MG/0.3ML DEVI Inject 0.3 mg into the muscle as needed. 07/06/12   [provider]  fluticasone (FLONASE) 50 MCG/ACT nasal spray Place into both nostrils daily.    [provider]  meloxicam (MOBIC) 15 MG tablet Take 15 mg by mouth daily. 09/14/22   [provider]  metoprolol succinate (TOPROL-XL) 25 MG 24 hr tablet TAKE 1 TABLET BY MOUTH DAILY 07/31/23   Wendall Stade, MD  montelukast (SINGULAIR) 10 MG tablet Take 10 mg by mouth daily. 09/16/23   [provider]  Nutritional Supplements (JUICE PLUS FIBRE PO) Take by mouth. 2 tabs twice daily    [provider]  omeprazole (PRILOSEC) 20 MG capsule Take 20 mg by mouth 2 (two) times daily before a meal.    [provider]  pravastatin (PRAVACHOL) 20 MG tablet TAKE 1 TABLET BY MOUTH DAILY 12/08/22   Wendall Stade, MD  promethazine-dextromethorphan (PROMETHAZINE-DM) 6.25-15 MG/5ML syrup Take 5 mLs by mouth 3 (three) times daily as needed for cough. 10/05/23   Wallis Bamberg, PA-C  sertraline (ZOLOFT) 50 MG tablet TAKE 1 TABLET BY MOUTH DAILY 03/09/23   Ardith Dark, MD  sildenafil (VIAGRA) 100 MG tablet TAKE 1/2 TABLET BY MOUTH DAILY AS NEEDED FOR FOR ERECTILE DYSFUNCTION 09/02/23   Ardith Dark, MD  triamcinolone ointment (KENALOG) 0.1 % Apply 1 Application topically as needed. 06/23/23   [provider]  valACYclovir (VALTREX) 500 MG tablet Take 1 tablet (500 mg total) by mouth 2 (two) times daily. As needed for flareups Patient not taking: Reported on 10/19/2023 06/30/23    Ardith Dark, MD    Physical Exam: Vitals:   11/08/23 0540 11/08/23 0600 11/08/23 0609 11/08/23 0654  BP: (!) 141/103 (!) 140/97  (!) 143/106  Pulse: 75 76  89  Resp: 19 19  18   Temp:    98.6 F (37 C)  TempSrc:   Oral Oral  SpO2: 95% 95%  97%  Weight:    79.9 kg  Height:    5\' 10"  (1.778 m)   Neurology awake and alert ENT with no pallor  Cardiovascular with S1 and S2 present  and regular with no gallops, rubs or murmurs Respiratory with no wheezing, no rhonchi mild rales at bases.   Abdomen with no distention  No lower extremity edema   Data Reviewed:   Na 139, K 3,5 Cl 106 bicarbonate 22, glucose 111, bun 36 cr 1,31  AST 40 ALT 60  Lipase 27 BNP 2,996  High sensitive troponin 65 and 49  Wbc 6,0 hgb 11,2 plt 267   Chest radiograph with cardiomegaly with bilateral hilar vascular congestion and bilateral interstitial infiltrates.   EKG 82 bom, left axis deviation, right bundle branch block, qtc 498, sinus rhythm with no significant ST segment or T wave changes.   Assessment and Plan: * Acute on chronic systolic CHF (congestive heart failure) (HCC) 2022 echocardiogram with EF 45 to 50% consistent with mild reduction in systolic function.  2023 cardiac MRI with EF 36%  He is having dyspnea on exertion, his BNP is elevated, serum cr elevated and chest radiograph with vascular congestion.   Will place patient on IV furosemide 40 mg IV bid Continue metoprolol and SGLT 2 inh  Continue with entresto, may need spironolactone  Follow up on inpatient echocardiogram, for further adjustments in medical therapy.   Troponin elevation due to heart failure exacerbation.   Essential hypertension, benign Uncontrolled hypertension.  Continue metoprolol and entresto.  Diuresis with furosemide 40 mg IV bid.   GERD with hiatal hernia Will continue antiacid therapy Liver US with negative for acute findings.   AML (acute myeloid leukemia) in remission [facility]) s/p BMT 2000 On  remission, follow up as outpatient.   Dyslipidemia Continue with pravastatin   Hypokalemia Add Kcl 40 meq today x1  Follow up renal function and electrolytes    Advance Care Planning:   Code Status: Full Code   Consults: none   Family Communication: I spoke with patient's wife at the bedside.   Severity of Illness: The appropriate patient status for this patient is INPATIENT. Inpatient status is judged to be reasonable and necessary in order to provide the required intensity of service to ensure the patient's safety. The patient's presenting symptoms, physical exam findings, and initial radiographic and laboratory data in the context of their chronic comorbidities is felt to place them at high risk for further clinical deterioration. Furthermore, it is not anticipated that the patient will be medically stable for discharge from the hospital within 2 midnights of admission.   * I certify that at the point of admission it is my clinical judgment that the patient will require inpatient hospital care spanning beyond 2 midnights from the point of admission due to high intensity of service, high risk for further deterioration and high frequency of surveillance required.*  Author: Coralie Keens, MD 11/08/2023 7:34 AM  For on call review [email or web address].

## 2023-11-08 NOTE — Care Plan (Addendum)
 Plan of Care Note for accepted transfer  Patient: [name]              ZOX:[id number]  DOA: 11/08/2023     Facility requesting transfer: [facility] emergency department Requesting Provider: Dr. Pilar Plate  Reason for transfer: Elevated troponin, AKI and epigastric pain.   Facility course: 68 year old man essential hypertension, chronic systolic CHF with reduced EF less than 36%, bifascicular block presented to emergency department with complaining of abdominal pain and shortness of breath associated with abdominal pain.  Epigastric abdominal pain with malaise for the past couple days. Was on a golf trip with his friends, played 4 rounds of golf, started feeling unwell during the trip. Dry cough, describes discomfort in the upper abdomen similar to acid reflux and how with her hernia symptoms he is experienced in the past but much worse than normal. Tender to palpation. No lower abdominal pain, no fever, associated with shortness of breath.   At presentation to ED patient blood pressure is elevated 148/108.  Initial troponin 65 pending second troponin. CBC unremarkable stable H&H 11.2 and 34. CMP showing evidence of AKI creatinine 1.3, elevated AST 60.  Normal ALT, bilirubin and ALP level. Normal lipase 27. Pending UA.  EKG showing normal sinus rhythm heart rate 82, right bundle branch and left anterior fascicular block.  CT abdomen pelvis: 1. Pericholecystic edema but no calcified stone or gallbladder distension, correlate for biliary colic symptoms. 2. Partially covered airspace disease at the right lung base suggesting pneumonia or aspiration. 3. Retroperitoneal edema is generalized and nonspecific, there is also small pleural effusions, question volume overload-which could also explain the pericholecystic findings.  Chest x-ray showing pulmonary vascular congestion and mild interstitial coarsening which might represent atypical pneumonia versus edema.  In the ED patient has been  given Zofran, 1 L of NS bolus.   Due to elevated troponin concern for atypical presentation of acute coronary syndrome.  Need to trend troponin, echocardiogram to look for any wall motion abnormality and inform cardiology upon arrival to the hospital.  Patient also has AKI likely secondary from recent golf trip and dehydration with associated abdominal/epigastric pain.  Also need an artery upper quadrant ultrasound to rule out acute cholecystitis.  Hospitalist has been consulted for further evaluation management of elevated troponin, acute kidney injury and epigastric pain/biliary colic.  Update, I have discussed case with on-call cardiology Dr. Sherron Flemings, patient possibly has some demand ischemia in the setting of biliary colic.  Recommended to continue the trend troponin and get an echocardiogram.  If there is significant elevation of second troponin need to inform cardiology for formal consult.  Plan of care: The patient is accepted for admission for inpatient status to Telemetry unit, at [facility].  [facility] will assume care on arrival to accepting facility. Until arrival, care as per EDP. However, [facility] available 24/7 for questions and assistance.   Check [email or web address] for on-call coverage.   Nursing staff, please call [facility] Admits & Consults System-Wide number under Amion on patient's arrival so appropriate admitting provider can evaluate the pt.    Author: Tereasa Coop, MD  11/08/2023  [facility]

## 2023-11-09 ENCOUNTER — Ambulatory Visit (HOSPITAL_COMMUNITY)

## 2023-11-09 ENCOUNTER — Encounter: Payer: Self-pay | Admitting: Cardiovascular Disease

## 2023-11-09 DIAGNOSIS — I5023 Acute on chronic systolic (congestive) heart failure: Secondary | ICD-10-CM | POA: Diagnosis not present

## 2023-11-09 DIAGNOSIS — I340 Nonrheumatic mitral (valve) insufficiency: Secondary | ICD-10-CM | POA: Diagnosis not present

## 2023-11-09 DIAGNOSIS — I471 Supraventricular tachycardia, unspecified: Secondary | ICD-10-CM

## 2023-11-09 DIAGNOSIS — E785 Hyperlipidemia, unspecified: Secondary | ICD-10-CM | POA: Diagnosis not present

## 2023-11-09 LAB — CBC
HCT: 34 % — ABNORMAL LOW (ref 39.0–52.0)
Hemoglobin: 11.4 g/dL — ABNORMAL LOW (ref 13.0–17.0)
MCH: 31.1 pg (ref 26.0–34.0)
MCHC: 33.5 g/dL (ref 30.0–36.0)
MCV: 92.9 fL (ref 80.0–100.0)
Platelets: 303 10*3/uL (ref 150–400)
RBC: 3.66 MIL/uL — ABNORMAL LOW (ref 4.22–5.81)
RDW: 13.2 % (ref 11.5–15.5)
WBC: 5.9 10*3/uL (ref 4.0–10.5)
nRBC: 0 % (ref 0.0–0.2)

## 2023-11-09 LAB — BASIC METABOLIC PANEL WITH GFR
Anion gap: 12 (ref 5–15)
BUN: 27 mg/dL — ABNORMAL HIGH (ref 8–23)
CO2: 22 mmol/L (ref 22–32)
Calcium: 8.6 mg/dL — ABNORMAL LOW (ref 8.9–10.3)
Chloride: 106 mmol/L (ref 98–111)
Creatinine, Ser: 1.47 mg/dL — ABNORMAL HIGH (ref 0.61–1.24)
GFR, Estimated: 52 mL/min — ABNORMAL LOW (ref >60)
Glucose, Bld: 107 mg/dL — ABNORMAL HIGH (ref 70–99)
Potassium: 3.6 mmol/L (ref 3.5–5.1)
Sodium: 140 mmol/L (ref 135–145)

## 2023-11-09 LAB — MAGNESIUM: Magnesium: 1.9 mg/dL (ref 1.7–2.4)

## 2023-11-09 MED ORDER — SODIUM CHLORIDE 0.9 % IV SOLN: INTRAVENOUS | Status: DC

## 2023-11-09 MED ORDER — HEPARIN SODIUM (PORCINE) 5000 UNIT/ML IJ SOLN
5000.0000 [IU] | Freq: Two times a day (BID) | INTRAMUSCULAR | Status: DC
  Administered 2023-11-09: 5000 [IU] via SUBCUTANEOUS
  Filled 2023-11-09 (×2): qty 1

## 2023-11-09 MED ORDER — ASPIRIN 81 MG PO CHEW
81.0000 mg | CHEWABLE_TABLET | ORAL | Status: AC
  Administered 2023-11-10: 81 mg via ORAL
  Filled 2023-11-09: qty 1

## 2023-11-09 MED ORDER — FUROSEMIDE 10 MG/ML IJ SOLN
40.0000 mg | Freq: Two times a day (BID) | INTRAMUSCULAR | Status: DC
  Administered 2023-11-09: 40 mg via INTRAVENOUS
  Filled 2023-11-09 (×2): qty 4

## 2023-11-09 NOTE — Progress Notes (Signed)
 PROGRESS NOTE    [name]  ZOX:[id number] DOB: 28-Mar-1956 DOA: 11/08/2023 PCP: Ardith Dark, MD  Outpatient Specialists:     Brief Narrative:  Patient is a 69 year old male with past medical history significant for cardiomyopathy, with EF in the 30s; AML, anemia, hypertension, dyslipidemia, and MDS.  Patient presented with abdominal pain that has resolved.  Right upper quadrant ultrasound was nonrevealing.  However, CT abdomen reported swelling of the gallbladder.  Abdominal pain has resolved.  Mild elevation of troponin noted.  Echocardiogram done today revealed EF of 30 to 35%, with possible ischemic component.  New moderate to severe MR reported.  Cardiology team has been consulted.  Mild worsening of renal function with IV Lasix.  Will hold IV Lasix for now.  Assess daily and, perhaps, try oral diuretics.  11/09/2023: Patient seen alongside patient's wife.  No abdominal pain.  Leg edema is improving.  Shortness of breath is improving.  Cardiology team to direct further care.  Patient may need ischemic workup.  As documented above, mild worsening of renal function noted.  Assessment & Plan:   Principal Problem:   Acute on chronic systolic CHF (congestive heart failure) (HCC) Active Problems:   Dyslipidemia   Essential hypertension, benign   GERD with hiatal hernia   AML (acute myeloid leukemia) in remission (HCC) s/p BMT 2000   Hypokalemia   Acute on chronic systolic congestive heart failure: -Echo revealed EF of 30 to 35%. -Last cardiac MRI revealed EF of 36%. -Echo report was concerning for possible ischemia.  Moderate to severe mitral regurgitation also noted. -Patient was on IV Lasix, but will hold due to rising serum creatinine. -Continue to monitor renal function and electrolytes. -Assess volume daily and consider oral diuretics. -Strict I's and O's. -Continue guideline directed medical therapy.  Stable ischemia: -Will defer to the cardiology team.  Abdominal  pain: -Resolved for now. -Follow-up with GI team on discharge. -Hopefully, there is not chest pain equivalent.  Mild worsening of serum creatinine: -Serum creatinine of 1.47 (up from 1.22) -Renal panel in the morning. -Hold IV Lasix for now.  Essential hypertension, benign -Blood pressure is controlled. -Continue to optimize.     GERD with hiatal hernia Continue current regimen (PPI and famotidine). -Follow-up with GI team on discharge.   -Keep head elevated. -Avoid excess meals.   AML (acute myeloid leukemia) in remission [facility]) s/p BMT 2000 On remission, follow up as outpatient.    Dyslipidemia Continue with pravastatin    Hypokalemia -Improved. -Potassium of 3.6 today.     DVT prophylaxis: Subcutaneous heparin Code Status: Full code Family Communication: Wife Disposition Plan: When cleared for discharge by cardiology team.   Consultants:  Cardiology  Procedures:  None  Antimicrobials:  None   Subjective: -Abdominal pain has resolved. -Shortness of breath is improving. -Leg edema is improving.  Objective: Vitals:   11/09/23 0356 11/09/23 1052 11/09/23 1252 11/09/23 1705  BP: 131/83 (!) 129/94 (!) 139/96 (!) 134/98  Pulse: 77 84 84   Resp: 18  18   Temp: 98.3 F (36.8 C)  98.1 F (36.7 C)   TempSrc: Oral  Oral   SpO2: 95%  99%   Weight:      Height:        Intake/Output Summary (Last 24 hours) at 11/09/2023 1831 Last data filed at 11/09/2023 1807 Gross per 24 hour  Intake --  Output 3250 ml  Net -3250 ml   Filed Weights   11/08/23 0321 11/08/23 0654  Weight:  76.7 kg 79.9 kg    Examination:  General exam: Appears calm and comfortable  Respiratory system: Clear to auscultation.  Cardiovascular system: S1 & S2 heard Gastrointestinal system: Abdomen is soft and nontender. Central nervous system: Alert and oriented.  Moves all extremities.   Extremities: Mild lower extremity edema.  Data Reviewed: I have personally reviewed following  labs and imaging studies  CBC: Recent Labs  Lab 11/08/23 0328 11/08/23 1035 11/08/23 1757 11/09/23 0617  WBC 6.0 5.0  --  5.9  HGB 11.2* 10.6* 12.9* 11.4*  HCT 34.2* 32.1* 39.0 34.0*  MCV 94.5 95.0  --  92.9  PLT 267 268  --  303   Basic Metabolic Panel: Recent Labs  Lab 11/08/23 0328 11/08/23 1035 11/09/23 0617  NA 139  --  140  K 3.5  --  3.6  CL 106  --  106  CO2 22  --  22  GLUCOSE 111*  --  107*  BUN 36*  --  27*  CREATININE 1.31* 1.31* 1.47*  CALCIUM 8.7*  --  8.6*  MG  --   --  1.9   GFR: Estimated Creatinine Clearance: 49.7 mL/min (A) (by C-G formula based on SCr of 1.47 mg/dL (H)). Liver Function Tests: Recent Labs  Lab 11/08/23 0328  AST 40  ALT 60*  ALKPHOS 67  BILITOT 0.4  PROT 6.6  ALBUMIN 3.9   Recent Labs  Lab 11/08/23 0328  LIPASE 27   No results for input(s): "AMMONIA" in the last 168 hours. Coagulation Profile: No results for input(s): "INR", "PROTIME" in the last 168 hours. Cardiac Enzymes: No results for input(s): "CKTOTAL", "CKMB", "CKMBINDEX", "TROPONINI" in the last 168 hours. BNP (last 3 results) No results for input(s): "PROBNP" in the last 8760 hours. HbA1C: No results for input(s): "HGBA1C" in the last 72 hours. CBG: No results for input(s): "GLUCAP" in the last 168 hours. Lipid Profile: No results for input(s): "CHOL", "HDL", "LDLCALC", "TRIG", "CHOLHDL", "LDLDIRECT" in the last 72 hours. Thyroid Function Tests: No results for input(s): "TSH", "T4TOTAL", "FREET4", "T3FREE", "THYROIDAB" in the last 72 hours. Anemia Panel: No results for input(s): "VITAMINB12", "FOLATE", "FERRITIN", "TIBC", "IRON", "RETICCTPCT" in the last 72 hours. Urine analysis:    Component Value Date/Time   COLORURINE YELLOW 12/14/2012 1748   APPEARANCEUR CLEAR 12/14/2012 1748   LABSPEC 1.019 12/14/2012 1748   PHURINE 6.5 12/14/2012 1748   GLUCOSEU NEGATIVE 12/14/2012 1748   GLUCOSEU NEGATIVE 01/24/2009 0748   HGBUR NEGATIVE 12/14/2012 1748    HGBUR negative 01/29/2010 0843   BILIRUBINUR n 10/03/2016 0911   KETONESUR negative 09/21/2014 0900   KETONESUR NEGATIVE 12/14/2012 1748   PROTEINUR n 10/03/2016 0911   PROTEINUR NEGATIVE 12/14/2012 1748   UROBILINOGEN 0.2 10/03/2016 0911   UROBILINOGEN 0.2 12/14/2012 1748   NITRITE n 10/03/2016 0911   NITRITE NEGATIVE 12/14/2012 1748   LEUKOCYTESUR Negative 10/03/2016 0911   Sepsis Labs: @LABRCNTIP (procalcitonin:4,lacticidven:4)  )No results found for this or any previous visit (from the past 240 hours).       Radiology Studies: ECHOCARDIOGRAM COMPLETE Result Date: 11/08/2023    ECHOCARDIOGRAM REPORT   Patient Name:   [name] Date of Exam: 11/08/2023 Medical Rec #:  [id number]       Height:       70.0 in Accession #:    [id number]      Weight:       176.2 lb Date of Birth:  28-Nov-1955      BSA:  1.978 m Patient Age:    67 years        BP:           147/102 mmHg Patient Gender: M               HR:           88 bpm. Exam Location:  Inpatient Procedure: 2D Echo, Cardiac Doppler and Color Doppler (Both Spectral and Color            Flow Doppler were utilized during procedure). Indications:    Dyspnea R06.00  History:        Patient has prior history of Echocardiogram examinations, most                 recent 03/18/2012. CHF; Risk Factors:Hypertension and                 Dyslipidemia.  Sonographer:    Lucendia Herrlich RCS Referring Phys: [id number] MAURICIO DANIEL ARRIEN IMPRESSIONS  1. While there is global left ventricular hypokinesis, the inferior and inferolateral walls have disproportionately severe hypokinesis, suggesting right coronary or left circumflex artery ischemia. There is posterior mitral leaflet tethering. Left ventricular ejection fraction, by estimation, is 30 to 35%. The left ventricle has moderately decreased function. The left ventricle demonstrates global hypokinesis. The left ventricular internal cavity size was mildly dilated. Left ventricular diastolic   parameters are consistent with Grade II diastolic dysfunction (pseudonormalization). Elevated left atrial pressure.  2. Right ventricular systolic function is low normal. The right ventricular size is normal. There is mildly elevated pulmonary artery systolic pressure. The estimated right ventricular systolic pressure is 36.5 mmHg.  3. Left atrial size was mildly dilated.  4. There is posterior mitral leaflet tethering. The regurgitant jet originates primarily at the A2-P2 commisureBy the PISA method, the effective regurgitant orifice area is 0.27 cm sq, regurgitant volume 35 ml, regurgitant fraction 45%. MR vena contracta is 5 mm. The mitral valve area by planimetry is 6.25 cm sq. The mitral valve is abnormal. Moderate to severe mitral valve regurgitation. The mean mitral valve gradient is 2.0 mmHg with average heart rate of 82 bpm.  5. The aortic valve is tricuspid. Aortic valve regurgitation is trivial.  6. The inferior vena cava is dilated in size with >50% respiratory variability, suggesting right atrial pressure of 8 mmHg. Comparison(s): A prior study was performed on 2013. Prior images unable to be directly viewed, comparison made by report only. Mitral insufficiency appears to be substantially worse and LVEF is lower. Overall impression suggests coronary insufficiency superimposed on preexisting nonischemiccardiomyopathy. Conclusion(s)/Recommendation(s): Consider TEE for MR evaluation. Consider repeat ischemic evaluation. FINDINGS  Left Ventricle: While there is global left ventricular hypokinesis, the inferior and inferolateral walls have disproportionately severe hypokinesis, suggesting right coronary or left circumflex artery ischemia. There is posterior mitral leaflet tethering. Left ventricular ejection fraction, by estimation, is 30 to 35%. The left ventricle has moderately decreased function. The left ventricle demonstrates global hypokinesis. The left ventricular internal cavity size was mildly  dilated. There is no left ventricular hypertrophy. Left ventricular diastolic parameters are consistent with Grade II diastolic dysfunction (pseudonormalization). Elevated left atrial pressure.  LV Wall Scoring: The inferior wall and posterior wall are disproportionately severely hypokinetic, but all wall segments are hypokinetic. Right Ventricle: The right ventricular size is normal. No increase in right ventricular wall thickness. Right ventricular systolic function is low normal. There is mildly elevated pulmonary artery systolic pressure. The tricuspid regurgitant velocity is 2.67 m/s, and with  an assumed right atrial pressure of 8 mmHg, the estimated right ventricular systolic pressure is 36.5 mmHg. Left Atrium: Left atrial size was mildly dilated. Right Atrium: Right atrial size was normal in size. Pericardium: There is no evidence of pericardial effusion. Mitral Valve: There is posterior mitral leaflet tethering. The regurgitant jet originates primarily at the A2-P2 commisureBy the PISA method, the effective regurgitant orifice area is 0.27 cm sq, regurgitant volume 35 ml, regurgitant fraction 45%. MR vena contracta is 5 mm. The mitral valve area by planimetry is 6.25 cm sq. The mitral valve is abnormal. Moderate to severe mitral valve regurgitation, with eccentric posteriorly directed jet. The mean mitral valve gradient is 2.0 mmHg with average heart  rate of 82 bpm. Tricuspid Valve: The tricuspid valve is normal in structure. Tricuspid valve regurgitation is mild. Aortic Valve: The aortic valve is tricuspid. Aortic valve regurgitation is trivial. Aortic valve peak gradient measures 3.8 mmHg. Pulmonic Valve: The pulmonic valve was grossly normal. Pulmonic valve regurgitation is not visualized. No evidence of pulmonic stenosis. Aorta: The aortic root and ascending aorta are structurally normal, with no evidence of dilitation. Venous: A pattern of systolic flow reversal, suggestive of severe mitral  regurgitation is recorded from the right upper pulmonary vein. The inferior vena cava is dilated in size with greater than 50% respiratory variability, suggesting right atrial pressure of 8 mmHg. IAS/Shunts: No atrial level shunt detected by color flow Doppler.  LEFT VENTRICLE PLAX 2D LVIDd:         5.60 cm      Diastology LVIDs:         4.47 cm      LV e' medial:    6.09 cm/s LV PW:         1.00 cm      LV E/e' medial:  13.4 LV IVS:        1.10 cm      LV e' lateral:   11.50 cm/s LVOT diam:     2.30 cm      LV E/e' lateral: 7.1 LV SV:         41 LV SV Index:   21 LVOT Area:     4.15 cm  LV Volumes (MOD) LV vol d, MOD A2C: 154.0 ml LV vol d, MOD A4C: 127.0 ml LV vol s, MOD A2C: 102.0 ml LV vol s, MOD A4C: 85.4 ml LV SV MOD A2C:     52.0 ml LV SV MOD A4C:     127.0 ml LV SV MOD BP:      49.5 ml RIGHT VENTRICLE             IVC RV S prime:     11.20 cm/s  IVC diam: 2.60 cm TAPSE (M-mode): 1.4 cm LEFT ATRIUM             Index        RIGHT ATRIUM           Index LA diam:        4.10 cm 2.07 cm/m   RA Area:     11.50 cm LA Vol (A2C):   43.5 ml 21.99 ml/m  RA Volume:   24.30 ml  12.29 ml/m LA Vol (A4C):   47.1 ml 23.81 ml/m LA Biplane Vol: 45.5 ml 23.00 ml/m  AORTIC VALVE AV Area (Vmax): 2.75 cm AV Vmax:        97.50 cm/s AV Peak Grad:   3.8 mmHg LVOT Vmax:  64.60 cm/s LVOT Vmean:     40.900 cm/s LVOT VTI:       0.099 m  AORTA Ao Root diam: 3.30 cm Ao Asc diam:  3.20 cm MITRAL VALVE                  TRICUSPID VALVE MV Area (PHT): 4.49 cm       TR Peak grad:   28.5 mmHg MV Mean grad:  2.0 mmHg       TR Vmax:        267.00 cm/s MV Decel Time: 169 msec MR Peak grad:    81.8 mmHg    SHUNTS MR Mean grad:    55.0 mmHg    Systemic VTI:  0.10 m MR Vmax:         452.33 cm/s  Systemic Diam: 2.30 cm MR Vmean:        353.0 cm/s MR PISA:         3.82 cm MR PISA Eff ROA: 27 mm MR PISA Radius:  0.78 cm MV E velocity: 81.80 cm/s MV A velocity: 39.50 cm/s MV E/A ratio:  2.07 Mihai Croitoru MD Electronically signed by Thurmon Fair MD Signature Date/Time: 11/08/2023/3:42:13 PM    Final    US Abdomen Limited RUQ (LIVER/GB) Result Date: 11/08/2023 CLINICAL DATA:  [id number] Epigastric abdominal pain [id number] EXAM: ULTRASOUND ABDOMEN LIMITED RIGHT UPPER QUADRANT COMPARISON:  Same day CT FINDINGS: Gallbladder: No gallstones or wall thickening visualized. No pericholecystic fluid is evident. No sonographic Murphy sign noted by sonographer. Common bile duct: Diameter: 6.5 mm. Liver: No focal lesion identified. Within normal limits in parenchymal echogenicity. Portal vein is patent on color Doppler imaging with normal direction of blood flow towards the liver. Other: None. IMPRESSION: Unremarkable right upper quadrant ultrasound. Electronically Signed   By: Duanne Guess D.O.   On: 11/08/2023 09:35   CT ABDOMEN PELVIS W CONTRAST Result Date: 11/08/2023 CLINICAL DATA:  Acute, nonlocalized abdominal pain EXAM: CT ABDOMEN AND PELVIS WITH CONTRAST TECHNIQUE: Multidetector CT imaging of the abdomen and pelvis was performed using the standard protocol following bolus administration of intravenous contrast. RADIATION DOSE REDUCTION: This exam was performed according to the departmental dose-optimization program which includes automated exposure control, adjustment of the mA and/or kV according to patient size and/or use of iterative reconstruction technique. CONTRAST:  OMNIPAQUE IOHEXOL 300 MG/ML  SOLN COMPARISON:  12/16/2006 FINDINGS: Lower chest: Small layering pleural effusions with adjacent atelectasis or scarring. Cluster of nodular airspace densities at the right lung base, minimally covered. Hepatobiliary: No focal liver abnormality.Fat stranding surrounds the gallbladder with only moderate distension and no calcified stone. No common bile duct dilatation Pancreas: Unremarkable. Spleen: Unremarkable. Adrenals/Urinary Tract: Negative adrenals. No hydronephrosis or stone. Unremarkable bladder. Stomach/Bowel: Some fat inflammation is  seen in the vicinity of the duodenum but no submucosal edema/thickening. Colonic diverticulosis is mild and without focal inflammation. No appendicitis. Vascular/Lymphatic: Atheromatous calcification of the aorta and iliacs. Distended appearance of the common iliac and IVC with generalized retroperitoneal edema greater on the right. No mass or adenopathy. Reproductive:No pathologic findings. Other: No ascites or pneumoperitoneum. Small fatty right inguinal hernia. Musculoskeletal: Lumbar spine degeneration especially affecting facets and L5-S1 disc space. IMPRESSION: 1. Pericholecystic edema but no calcified stone or gallbladder distension, correlate for biliary colic symptoms. 2. Partially covered airspace disease at the right lung base suggesting pneumonia or aspiration. 3. Retroperitoneal edema is generalized and nonspecific, there is also small pleural effusions, question volume overload-which could also explain the  pericholecystic findings. Electronically Signed   By: Tiburcio Pea M.D.   On: 11/08/2023 04:19   DG Chest Port 1 View Result Date: 11/08/2023 CLINICAL DATA:  Shortness of breath and upper abdominal pain. EXAM: PORTABLE CHEST 1 VIEW COMPARISON:  12/14/2012 FINDINGS: Stable cardiomediastinal silhouette at the upper limits of normal in size. Pulmonary vascular congestion and mild interstitial coarsening. No focal consolidation, pleural effusion, or pneumothorax. IMPRESSION: Pulmonary vascular congestion and mild interstitial coarsening which may be due to edema or atypical infection. Electronically Signed   By: Minerva Fester M.D.   On: 11/08/2023 04:01        Scheduled Meds:  empagliflozin  10 mg Oral Daily   fluticasone  1 spray Each Nare Daily   furosemide  40 mg Intravenous BID   loratadine  10 mg Oral Daily   metoprolol succinate  25 mg Oral Daily   montelukast  10 mg Oral Daily   pantoprazole  40 mg Oral BID   pravastatin  20 mg Oral q1800   sacubitril-valsartan  1 tablet  Oral BID   sertraline  50 mg Oral Daily   valACYclovir  500 mg Oral BID   Continuous Infusions:   LOS: 1 day    Time spent: 35 minutes.    Berton Mount, MD  [facility] Pager #: [phone] 7PM-7AM contact night coverage as above

## 2023-11-09 NOTE — Consult Note (Addendum)
 Cardiology Consultation   Patient ID: LEDGER HEINDL MRN: [id number]; DOB: 24-Jun-1956  Admit date: 11/08/2023 Date of Consult: 11/09/2023  PCP:  Ardith Dark, MD   [facility] Providers Cardiologist:  Charlton Haws, MD   {  Patient Profile:   [name] is a 68 y.o. 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 Dartha Lodge.  History of Present Illness:   Mr. Lamping used to be followed by a cardiologist at [facility] but has been followed by Dr. Eden Emms for several years now.  Has had some systolic dysfunction mildly reduced EF since at least 2022 and reports of MR being of mild to moderate severity since 2020/2021 based off of prior notes.  Coronary CT 2022 with CAC score of 129 with minimal CAD.  Eventually he had a cardiac MRI in February 2023 that demonstrated an EF of 36% had normal RV with LGE pattern and basal septum felt to be related to prior myocarditis.  Seen briefly in the ED earlier in March of this year for bradycardia but related to PVCs.  3-day heart monitor without any significant findings, mostly sinus with 6 beat of SVT no significant ventricular ectopy.  He was just seen outpatient 10/19/2023 and doing well on GDMT.  Currently cardiology asked to due to CHF however his primary chief complaint was stomach pain that has subsided.  Imaging and workup so far unremarkable.  He was noted to be mildly volume up on exam and started on IV Lasix 40 mg twice daily with good urinary output and feels symptoms have greatly improved.  However echocardiogram this admission showing some regional wall motion abnormalities in the inferolateral walls and with new moderate-severe MR.  In discussing with patient he reports that he is very stable from a CHF standpoint and does not even use a  diuretic.  Very functional and often plays golf 2-3 times per week however he did notice that last Wednesday and Thursday he felt a lot more fatigued and short of breath than usual but as the week progressed he started to feeling improved.  He also has a fitness monitor that noted his heart rates were more elevated and sustained on Wednesday and Thursday but recovered the following days.  Denies any complaints of chest pain whatsoever.  Currently does not have any orthopnea, peripheral edema, palpitations, fatigue.  He has been diuresed now and feels very comfortable and would like to be discharged as soon as possible.  In addition, patient is very well-educated and engaged with his medical health.  He has done a lot of research already about his valvular disease and echo results.  CT of the abdomen showing pericholecystic edema but no calcified stones or gallbladder distention.  Airspace disease suggestive of possible pneumonia/aspiration.  Small pleural effusions.  Negative right upper quadrant ultrasound.  Chest x-ray with pulmonary edema versus infection.  Potassium 3.6.  Creatinine 1.47.  BNP almost 3000.  Hemoglobin 11.4.  Troponin 65-49.   Past Medical History:  Diagnosis Date   Acute myeloid leukemia in remission (HCC) 1999   Anemia    Arthritis    Depression    Diverticulosis of colon (without mention of hemorrhage)    Diverticulosis of colon (without mention of hemorrhage) 04/21/2011   Esophageal reflux    Family hx colonic  polyps    Family hx colonic polyps 04/21/2011   Hiatal hernia    Hiatal hernia 04/21/2011   History of kidney stones    Hyperlipemia    Hypertension    MDS (myelodysplastic syndrome) (HCC)    Psychosexual dysfunction with inhibited sexual excitement     Past Surgical History:  Procedure Laterality Date   EAR EXAMINATION UNDER ANESTHESIA Right 10/29/2021   middle ear cleaned out infection, with an inplanted ear tube   KNEE SURGERY Left 08/14/2021   [facility]    SHOULDER SURGERY     x 3      Inpatient Medications: Scheduled Meds:  empagliflozin  10 mg Oral Daily   fluticasone  1 spray Each Nare Daily   loratadine  10 mg Oral Daily   metoprolol succinate  25 mg Oral Daily   montelukast  10 mg Oral Daily   pantoprazole  40 mg Oral BID   pravastatin  20 mg Oral q1800   sacubitril-valsartan  1 tablet Oral BID   sertraline  50 mg Oral Daily   valACYclovir  500 mg Oral BID   Continuous Infusions:  PRN Meds: acetaminophen **OR** acetaminophen, guaiFENesin-dextromethorphan, melatonin, ondansetron (ZOFRAN) IV  Allergies:    Allergies  Allergen Reactions   Morphine Other (See Comments)    UNKNOWN - REACTION IS NOT LISTED "Makes me crazy"   Marinol [Dronabinol]     Delusions.    Social History:   Social History   Socioeconomic History   Marital status: Married    Spouse name: Not on file   Number of children: 2   Years of education: Not on file   Highest education level: Not on file  Occupational History   Occupation: self employed    Employer: [employer]  Tobacco Use   Smoking status: Never   Smokeless tobacco: Never  Vaping Use   Vaping status: Never Used  Substance and Sexual Activity   Alcohol use: Yes    Comment: one occ    Drug use: No   Sexual activity: Never  Other Topics Concern   Not on file  Social History Narrative   2 caffeine drinks daily    Social Drivers of Health   Financial Resource Strain: Low Risk  (01/20/2023)   Overall Financial Resource Strain (CARDIA)    Difficulty of Paying Living Expenses: Not hard at all  Food Insecurity: No Food Insecurity (11/08/2023)   Hunger Vital Sign    Worried About Running Out of Food in the Last Year: Never true    Ran Out of Food in the Last Year: Never true  Transportation Needs: No Transportation Needs (11/08/2023)   PRAPARE - Administrator, Civil Service (Medical): No    Lack of Transportation (Non-Medical): No  Physical Activity: Sufficiently  Active (01/20/2023)   Exercise Vital Sign    Days of Exercise per Week: 7 days    Minutes of Exercise per Session: 40 min  Stress: No Stress Concern Present (01/20/2023)   [name] - Occupational Stress Questionnaire    Feeling of Stress : Not at all  Social Connections: Socially Integrated (11/08/2023)   Social Connection and Isolation Panel [NHANES]    Frequency of Communication with Friends and Family: More than three times a week    Frequency of Social Gatherings with Friends and Family: More than three times a week    Attends Religious Services: More than 4 times per year    Active Member of  Clubs or Organizations: Yes    Attends Engineer, structural: More than 4 times per year    Marital Status: Married  Catering manager Violence: Not At Risk (11/08/2023)   Humiliation, Afraid, Rape, and Kick questionnaire    Fear of Current or Ex-Partner: No    Emotionally Abused: No    Physically Abused: No    Sexually Abused: No    Family History:   Family History  Problem Relation Age of Onset   Hypertension Father    Heart disease Father    Prostate cancer Father        dx late 52s   Cancer Father 55       prostate ca   Celiac disease Mother    Colon polyps Mother    Breast cancer Mother    Cancer Mother        Bladder   Colon cancer Neg Hx    Esophageal cancer Neg Hx    Inflammatory bowel disease Neg Hx    Liver disease Neg Hx    Pancreatic cancer Neg Hx    Rectal cancer Neg Hx    Stomach cancer Neg Hx      ROS:  Please see the history of present illness.  All other ROS reviewed and negative.     Physical Exam/Data:   Vitals:   11/08/23 2011 11/09/23 0356 11/09/23 1052 11/09/23 1252  BP: (!) 137/97 131/83 (!) 129/94 (!) 139/96  Pulse: 85 77 84 84  Resp: 18 18  18   Temp: 98.7 F (37.1 C) 98.3 F (36.8 C)  98.1 F (36.7 C)  TempSrc: Oral Oral  Oral  SpO2: 96% 95%  99%  Weight:      Height:        Intake/Output Summary  (Last 24 hours) at 11/09/2023 1428 Last data filed at 11/09/2023 1255 Gross per 24 hour  Intake 240 ml  Output 4550 ml  Net -4310 ml      11/08/2023    6:54 AM 11/08/2023    3:21 AM 10/19/2023    8:57 AM  Last 3 Weights  Weight (lbs) 176 lb 3.2 oz 169 lb 170 lb  Weight (kg) 79.924 kg 76.658 kg 77.111 kg     Body mass index is 25.28 kg/m.  General:  Well nourished, well developed, in no acute distress HEENT: normal Neck: no JVD Vascular: No carotid bruits; Distal pulses 2+ bilaterally Cardiac:  normal S1, S2; RRR; 2/6 murmur at apex Lungs:  clear to auscultation bilaterally, no wheezing, rhonchi or rales  Abd: soft, nontender, no hepatomegaly  Ext: no edema Musculoskeletal:  No deformities, BUE and BLE strength normal and equal Skin: warm and dry  Neuro:  CNs 2-12 intact, no focal abnormalities noted Psych:  Normal affect   EKG:  The EKG was personally reviewed and demonstrates: Sinus rhythm heart rate 82.  No acute ST-T wave changes.  Left anterior fascicular block, right bundle branch block. Telemetry:  Telemetry was personally reviewed and demonstrates: Sinus rhythm heart rates in the 80s  Relevant CV Studies: Echocardiogram 11/08/2023 1. While there is global left ventricular hypokinesis, the inferior and  inferolateral walls have disproportionately severe hypokinesis, suggesting  right coronary or left circumflex artery ischemia. There is posterior  mitral leaflet tethering. Left  ventricular ejection fraction, by estimation, is 30 to 35%. The left  ventricle has moderately decreased function. The left ventricle  demonstrates global hypokinesis. The left ventricular internal cavity size  was mildly dilated. Left ventricular diastolic  parameters are consistent with Grade II diastolic dysfunction  (pseudonormalization). Elevated left atrial pressure.   2. Right ventricular systolic function is low normal. The right  ventricular size is normal. There is mildly elevated  pulmonary artery  systolic pressure. The estimated right ventricular systolic pressure is  36.5 mmHg.   3. Left atrial size was mildly dilated.   4. There is posterior mitral leaflet tethering. The regurgitant jet  originates primarily at the A2-P2 commisureBy the PISA method, the  effective regurgitant orifice area is 0.27 cm sq, regurgitant volume 35  ml, regurgitant fraction 45%. MR vena  contracta is 5 mm. The mitral valve area by planimetry is 6.25 cm sq. The  mitral valve is abnormal. Moderate to severe mitral valve regurgitation.  The mean mitral valve gradient is 2.0 mmHg with average heart rate of 82  bpm.   5. The aortic valve is tricuspid. Aortic valve regurgitation is trivial.   6. The inferior vena cava is dilated in size with >50% respiratory  variability, suggesting right atrial pressure of 8 mmHg.   Comparison(s): A prior study was performed on 2013. Prior images unable to  be directly viewed, comparison made by report only. Mitral insufficiency  appears to be substantially worse and LVEF is lower. Overall impression  suggests coronary insufficiency  superimposed on preexisting nonischemiccardiomyopathy.   cMRI 10/07/2021 1. Normal LV size and wall thickness, diffuse hypokinesis with EF 36%.   2.  Normal RV size with moderate hypokinesis, EF 35%.   3. Non-coronary somewhat subtle LGE pattern in the basal septum. Mid-wall LGE most suggestive of prior myocarditis. Elevated extracellular volume percentage in the basal septum suggestive of fibrosis. Basal septal T2 not elevated, doubt active inflammatio Laboratory Data:  High Sensitivity Troponin:   Recent Labs  Lab 11/08/23 0328 11/08/23 1035  TROPONINIHS 65* 49*     Chemistry Recent Labs  Lab 11/08/23 0328 11/08/23 1035 11/09/23 0617  NA 139  --  140  K 3.5  --  3.6  CL 106  --  106  CO2 22  --  22  GLUCOSE 111*  --  107*  BUN 36*  --  27*  CREATININE 1.31* 1.31* 1.47*  CALCIUM 8.7*  --  8.6*  MG   --   --  1.9  GFRNONAA 59* 59* 52*  ANIONGAP 11  --  12    Recent Labs  Lab 11/08/23 0328  PROT 6.6  ALBUMIN 3.9  AST 40  ALT 60*  ALKPHOS 67  BILITOT 0.4   Lipids No results for input(s): "CHOL", "TRIG", "HDL", "LABVLDL", "LDLCALC", "CHOLHDL" in the last 168 hours.  Hematology Recent Labs  Lab 11/08/23 0328 11/08/23 1035 11/08/23 1757 11/09/23 0617  WBC 6.0 5.0  --  5.9  RBC 3.62* 3.38*  --  3.66*  HGB 11.2* 10.6* 12.9* 11.4*  HCT 34.2* 32.1* 39.0 34.0*  MCV 94.5 95.0  --  92.9  MCH 30.9 31.4  --  31.1  MCHC 32.7 33.0  --  33.5  RDW 13.2 13.2  --  13.2  PLT 267 268  --  303   Thyroid No results for input(s): "TSH", "FREET4" in the last 168 hours.  BNP Recent Labs  Lab 11/08/23 0328  BNP 2,996.9*    DDimer No results for input(s): "DDIMER" in the last 168 hours.   Radiology/Studies:  ECHOCARDIOGRAM COMPLETE Result Date: 11/08/2023    ECHOCARDIOGRAM REPORT   Patient Name:   DESMEN SCHOFFSTALL Date of Exam: 11/08/2023 Medical Rec #:  [id number]       Height:       70.0 in Accession #:    [id number]      Weight:       176.2 lb Date of Birth:  Dec 28, 1955      BSA:          1.978 m Patient Age:    67 years        BP:           147/102 mmHg Patient Gender: M               HR:           88 bpm. Exam Location:  Inpatient Procedure: 2D Echo, Cardiac Doppler and Color Doppler (Both Spectral and Color            Flow Doppler were utilized during procedure). Indications:    Dyspnea R06.00  History:        Patient has prior history of Echocardiogram examinations, most                 recent 03/18/2012. CHF; Risk Factors:Hypertension and                 Dyslipidemia.  Sonographer:    Lucendia Herrlich RCS Referring Phys: [id number] MAURICIO DANIEL ARRIEN IMPRESSIONS  1. While there is global left ventricular hypokinesis, the inferior and inferolateral walls have disproportionately severe hypokinesis, suggesting right coronary or left circumflex artery ischemia. There is posterior mitral leaflet  tethering. Left ventricular ejection fraction, by estimation, is 30 to 35%. The left ventricle has moderately decreased function. The left ventricle demonstrates global hypokinesis. The left ventricular internal cavity size was mildly dilated. Left ventricular diastolic  parameters are consistent with Grade II diastolic dysfunction (pseudonormalization). Elevated left atrial pressure.  2. Right ventricular systolic function is low normal. The right ventricular size is normal. There is mildly elevated pulmonary artery systolic pressure. The estimated right ventricular systolic pressure is 36.5 mmHg.  3. Left atrial size was mildly dilated.  4. There is posterior mitral leaflet tethering. The regurgitant jet originates primarily at the A2-P2 commisureBy the PISA method, the effective regurgitant orifice area is 0.27 cm sq, regurgitant volume 35 ml, regurgitant fraction 45%. MR vena contracta is 5 mm. The mitral valve area by planimetry is 6.25 cm sq. The mitral valve is abnormal. Moderate to severe mitral valve regurgitation. The mean mitral valve gradient is 2.0 mmHg with average heart rate of 82 bpm.  5. The aortic valve is tricuspid. Aortic valve regurgitation is trivial.  6. The inferior vena cava is dilated in size with >50% respiratory variability, suggesting right atrial pressure of 8 mmHg. Comparison(s): A prior study was performed on 2013. Prior images unable to be directly viewed, comparison made by report only. Mitral insufficiency appears to be substantially worse and LVEF is lower. Overall impression suggests coronary insufficiency superimposed on preexisting nonischemiccardiomyopathy. Conclusion(s)/Recommendation(s): Consider TEE for MR evaluation. Consider repeat ischemic evaluation. FINDINGS  Left Ventricle: While there is global left ventricular hypokinesis, the inferior and inferolateral walls have disproportionately severe hypokinesis, suggesting right coronary or left circumflex artery ischemia.  There is posterior mitral leaflet tethering. Left ventricular ejection fraction, by estimation, is 30 to 35%. The left ventricle has moderately decreased function. The left ventricle demonstrates global hypokinesis. The left ventricular internal cavity size was mildly dilated. There is no left ventricular hypertrophy. Left ventricular diastolic parameters are consistent with Grade II diastolic dysfunction (pseudonormalization). Elevated left atrial pressure.  LV Wall  Scoring: The inferior wall and posterior wall are disproportionately severely hypokinetic, but all wall segments are hypokinetic. Right Ventricle: The right ventricular size is normal. No increase in right ventricular wall thickness. Right ventricular systolic function is low normal. There is mildly elevated pulmonary artery systolic pressure. The tricuspid regurgitant velocity is 2.67 m/s, and with an assumed right atrial pressure of 8 mmHg, the estimated right ventricular systolic pressure is 36.5 mmHg. Left Atrium: Left atrial size was mildly dilated. Right Atrium: Right atrial size was normal in size. Pericardium: There is no evidence of pericardial effusion. Mitral Valve: There is posterior mitral leaflet tethering. The regurgitant jet originates primarily at the A2-P2 commisureBy the PISA method, the effective regurgitant orifice area is 0.27 cm sq, regurgitant volume 35 ml, regurgitant fraction 45%. MR vena contracta is 5 mm. The mitral valve area by planimetry is 6.25 cm sq. The mitral valve is abnormal. Moderate to severe mitral valve regurgitation, with eccentric posteriorly directed jet. The mean mitral valve gradient is 2.0 mmHg with average heart  rate of 82 bpm. Tricuspid Valve: The tricuspid valve is normal in structure. Tricuspid valve regurgitation is mild. Aortic Valve: The aortic valve is tricuspid. Aortic valve regurgitation is trivial. Aortic valve peak gradient measures 3.8 mmHg. Pulmonic Valve: The pulmonic valve was grossly  normal. Pulmonic valve regurgitation is not visualized. No evidence of pulmonic stenosis. Aorta: The aortic root and ascending aorta are structurally normal, with no evidence of dilitation. Venous: A pattern of systolic flow reversal, suggestive of severe mitral regurgitation is recorded from the right upper pulmonary vein. The inferior vena cava is dilated in size with greater than 50% respiratory variability, suggesting right atrial pressure of 8 mmHg. IAS/Shunts: No atrial level shunt detected by color flow Doppler.  LEFT VENTRICLE PLAX 2D LVIDd:         5.60 cm      Diastology LVIDs:         4.47 cm      LV e' medial:    6.09 cm/s LV PW:         1.00 cm      LV E/e' medial:  13.4 LV IVS:        1.10 cm      LV e' lateral:   11.50 cm/s LVOT diam:     2.30 cm      LV E/e' lateral: 7.1 LV SV:         41 LV SV Index:   21 LVOT Area:     4.15 cm  LV Volumes (MOD) LV vol d, MOD A2C: 154.0 ml LV vol d, MOD A4C: 127.0 ml LV vol s, MOD A2C: 102.0 ml LV vol s, MOD A4C: 85.4 ml LV SV MOD A2C:     52.0 ml LV SV MOD A4C:     127.0 ml LV SV MOD BP:      49.5 ml RIGHT VENTRICLE             IVC RV S prime:     11.20 cm/s  IVC diam: 2.60 cm TAPSE (M-mode): 1.4 cm LEFT ATRIUM             Index        RIGHT ATRIUM           Index LA diam:        4.10 cm 2.07 cm/m   RA Area:     11.50 cm LA Vol (A2C):   43.5 ml 21.99 ml/m  RA Volume:  24.30 ml  12.29 ml/m LA Vol (A4C):   47.1 ml 23.81 ml/m LA Biplane Vol: 45.5 ml 23.00 ml/m  AORTIC VALVE AV Area (Vmax): 2.75 cm AV Vmax:        97.50 cm/s AV Peak Grad:   3.8 mmHg LVOT Vmax:      64.60 cm/s LVOT Vmean:     40.900 cm/s LVOT VTI:       0.099 m  AORTA Ao Root diam: 3.30 cm Ao Asc diam:  3.20 cm MITRAL VALVE                  TRICUSPID VALVE MV Area (PHT): 4.49 cm       TR Peak grad:   28.5 mmHg MV Mean grad:  2.0 mmHg       TR Vmax:        267.00 cm/s MV Decel Time: 169 msec MR Peak grad:    81.8 mmHg    SHUNTS MR Mean grad:    55.0 mmHg    Systemic VTI:  0.10 m MR Vmax:          452.33 cm/s  Systemic Diam: 2.30 cm MR Vmean:        353.0 cm/s MR PISA:         3.82 cm MR PISA Eff ROA: 27 mm MR PISA Radius:  0.78 cm MV E velocity: 81.80 cm/s MV A velocity: 39.50 cm/s MV E/A ratio:  2.07 Mihai Croitoru MD Electronically signed by Thurmon Fair MD Signature Date/Time: 11/08/2023/3:42:13 PM    Final    US Abdomen Limited RUQ (LIVER/GB) Result Date: 11/08/2023 CLINICAL DATA:  [id number] Epigastric abdominal pain [id number] EXAM: ULTRASOUND ABDOMEN LIMITED RIGHT UPPER QUADRANT COMPARISON:  Same day CT FINDINGS: Gallbladder: No gallstones or wall thickening visualized. No pericholecystic fluid is evident. No sonographic Murphy sign noted by sonographer. Common bile duct: Diameter: 6.5 mm. Liver: No focal lesion identified. Within normal limits in parenchymal echogenicity. Portal vein is patent on color Doppler imaging with normal direction of blood flow towards the liver. Other: None. IMPRESSION: Unremarkable right upper quadrant ultrasound. Electronically Signed   By: Duanne Guess D.O.   On: 11/08/2023 09:35   CT ABDOMEN PELVIS W CONTRAST Result Date: 11/08/2023 CLINICAL DATA:  Acute, nonlocalized abdominal pain EXAM: CT ABDOMEN AND PELVIS WITH CONTRAST TECHNIQUE: Multidetector CT imaging of the abdomen and pelvis was performed using the standard protocol following bolus administration of intravenous contrast. RADIATION DOSE REDUCTION: This exam was performed according to the departmental dose-optimization program which includes automated exposure control, adjustment of the mA and/or kV according to patient size and/or use of iterative reconstruction technique. CONTRAST:  OMNIPAQUE IOHEXOL 300 MG/ML  SOLN COMPARISON:  12/16/2006 FINDINGS: Lower chest: Small layering pleural effusions with adjacent atelectasis or scarring. Cluster of nodular airspace densities at the right lung base, minimally covered. Hepatobiliary: No focal liver abnormality.Fat stranding surrounds the gallbladder with  only moderate distension and no calcified stone. No common bile duct dilatation Pancreas: Unremarkable. Spleen: Unremarkable. Adrenals/Urinary Tract: Negative adrenals. No hydronephrosis or stone. Unremarkable bladder. Stomach/Bowel: Some fat inflammation is seen in the vicinity of the duodenum but no submucosal edema/thickening. Colonic diverticulosis is mild and without focal inflammation. No appendicitis. Vascular/Lymphatic: Atheromatous calcification of the aorta and iliacs. Distended appearance of the common iliac and IVC with generalized retroperitoneal edema greater on the right. No mass or adenopathy. Reproductive:No pathologic findings. Other: No ascites or pneumoperitoneum. Small fatty right inguinal hernia. Musculoskeletal: Lumbar spine degeneration especially affecting facets  and L5-S1 disc space. IMPRESSION: 1. Pericholecystic edema but no calcified stone or gallbladder distension, correlate for biliary colic symptoms. 2. Partially covered airspace disease at the right lung base suggesting pneumonia or aspiration. 3. Retroperitoneal edema is generalized and nonspecific, there is also small pleural effusions, question volume overload-which could also explain the pericholecystic findings. Electronically Signed   By: Tiburcio Pea M.D.   On: 11/08/2023 04:19   DG Chest Port 1 View Result Date: 11/08/2023 CLINICAL DATA:  Shortness of breath and upper abdominal pain. EXAM: PORTABLE CHEST 1 VIEW COMPARISON:  12/14/2012 FINDINGS: Stable cardiomediastinal silhouette at the upper limits of normal in size. Pulmonary vascular congestion and mild interstitial coarsening. No focal consolidation, pleural effusion, or pneumothorax. IMPRESSION: Pulmonary vascular congestion and mild interstitial coarsening which may be due to edema or atypical infection. Electronically Signed   By: Minerva Fester M.D.   On: 11/08/2023 04:01   LONG TERM MONITOR (3-14 DAYS) Result Date: 11/05/2023 Patch Wear Time:  6 days and 5  hours (2025-03-13T14:53:54-0400 to 2025-03-19T20:03:08-398) Patient had a min HR of 62 bpm, max HR of 169 bpm, and avg HR of 82 bpm. Predominant underlying rhythm was Sinus Rhythm. Bundle Branch Block/IVCD was present. 1 run of Supraventricular Tachycardia occurred lasting 6 beats with a max rate of 169 bpm (avg 151 bpm). Isolated SVEs were rare (<1.0%), and no SVE Couplets or SVE Triplets were present. Isolated VEs were rare (<1.0%), and no VE Couplets or VE Triplets were present. Charlton Haws MD [facility]     Assessment and Plan:   Chronic HFrEF Nonischemic cardiomyopathy -09/2021 cMRI LVEF 36%, LGE in basal septum consistent with previous myocarditis.  Echo here shows EF 30 to 35% with inferior/inferolateral wall severe hypokinesis concerning for RCA/LCx ischemia. G2DD.  EKG no acute STTW changes and trops minimally elevated likely demand ischemia. Normally well compensated however suspect current presentation likely attributed to concerns of ischemic MR. Started on IV Lasix 40 mg twice daily with significant improvement in symptoms now looks to be euvolemic at baseline.  -3.3 L yesterday and another 1.8L today. GDMT: On Farxiga 10 mg, Toprol-XL 25 mg, Entresto 97-103 mg twice daily. May consider addition of spiro once renal function normalizes.  Stop IV lasix, can transition to PO 40mg  PRN or daily based off symptoms and swelling.  May be candidate for CRT. EF continues to be persistently low despite optimization with GDMT.   New moderate-severe MR Findings seems suggested of ischemic MR. Has posterior mitral leaflet tethering. Regurgitant orifice area is 0.27 cm sq, regurgitant volume 35 ml, regurgitant fraction 45%. MR vena contracta is 5 mm. The mitral valve area by planimetry is 6.25 cm sq. Generally very functional however since last week he has noticed a new decrease in exercise intolerance.   Needs R/LHC 1st to try and fix potential reversible ischemia. Then could consider repeat echo vs TEE  depending on those results.  He is anxious to discharge and would be in favor of outpatient workup.   Nonobstructive CAD Hyperlipidemia Noted on previous CCTA in 2022.  Cath as above. LDL 53 3 months ago.  Continue pravastatin 20 mg  Bifascicular block Need to continue to monitor this closely given he is also on beta-blocker for GDMT.  Asymptomatic.   Risk Assessment/Risk Scores:   New York Heart Association (NYHA) Functional Class NYHA Class II        For questions or updates, please contact [facility] Please consult [email or web address] for contact info under  Signed, Abagail Kitchens, PA-C  11/09/2023 2:28 PM   I have personally seen and examined the patient.  My HPI, Exam, and assessment and plan are below, independent of the NPP above.  Mr. Riolo is a 68 year old male with heart failure with reduced ejection fraction who presents with worsening symptoms of fatigue and dyspnea.  Hx of AML in remission X2.  Minimal non obstructive CAD.  2023 CMR not suggestive of AL-CA- LVEF 36%.  He has a history of heart failure with reduced ejection fraction, with an EF of 36% as per CMR report. Over the past week, he has experienced worsening symptoms, including increased fatigue and shortness of breath, particularly during activities such as playing golf. His heart rate improved on Friday but then worsened again. His abdominal pain has resolved since being diuresed.  He has severe functional mitral regurgitation related to wall motion abnormality. There is evidence of dyssynchrony, and he has a right bundle branch block and left anterior vesicular block. He uses a Fitbit to monitor his heart rate, which is currently 73 at rest. He is very active at baseline but has felt more tired and out of breath recently.  He has a history of supraventricular tachycardia, which may be contributing to his symptoms of tachycardia and chest discomfort.  He has hyperlipidemia, for which he is on  pravastatin.  He has a history of hypertension, which is being managed as part of his overall cardiovascular care.  He has a history of acute myeloid leukemia in remission and is followed up as an outpatient with hematology.  Exam notable for  Gen: no distress  Neck: +HJR Cardiac: No Rubs or Gallops, holosystolic Murmur, RRR, +2 radial pulses Respiratory: Clear to auscultation bilaterally, normal effort, normal  respiratory rate GI: Soft, nontender, non-distended  MS: Trace ankle edema;  moves all extremities Integument: Skin feels warm Neuro:  At time of evaluation, alert and oriented to person/place/time/situation  Psych: Normal affect, patient feels ok  Tele: SR rates 90s  DIAGNOSTIC Echocardiogram: No significant hypertrophy, EF moderately decreased, evidence of dyssynchrony (EKG shows right bundle branch block, left anterior fascicular block (10/29/2023)), inferloateral WMA with ischemic mod-severe MR; personally reviewed these images with patient and wife  In assessment and plan:   Severe functional mitral regurgitation - Severe functional mitral regurgitation related to wall motion abnormality. Echocardiogram does not show significant hypertrophy, and the condition is not consistent with AL cardiac amyloidosis. Evidence of dyssynchrony with right bundle branch block and left anterior vesicular block. A holosystolic heart murmur and hepatojugular reflux are present. Heart rate of 98 is likely compensatory. Prefers conservative outpatient management if possible. After prolonged discussion amenable to left and right heart catheterization, if  does not show significant V wave or coronary disease and confirm euvolemia, outpatient management will be pursued. If a pronounced V wave is present, inpatient management will be necessary. Discussed potential for minimally invasive mitral valve surgery if ischemic MR is not present. - Repeat ischemic evaluation with left and right heart  catheterization - Perform TEE if feasible (likely Wednesday) - Continue diuresis today - Discuss potential minimally invasive mitral valve surgery if ischemic MR is not present  Risks and benefits of cardiac catheterization have been discussed with the patient.  These include bleeding, infection, kidney damage, stroke, heart attack, death.  The patient understands these risks and is willing to proceed.  Access recommendations: R radial, R AC Procedural considerations RHC V wave will guide mgmt, low threshold for RCA/LCX intervention  We discussed his cath and that he wants the most experienced operator.  I discussed MC and his MR experience.  I noted that both CM and MC are very talented IC's and that he will be in the best of hands   Acute on chronic Heart failure with reduced ejection fraction Heart failure with reduced ejection fraction, EF of 36% by CMR. Experiencing worsening symptoms, including fatigue and dyspnea, despite being active at baseline. Resting heart rate is 73, monitored via Fitbit. Diuresis resolved abdominal pain. Preference for outpatient management if euvolemic status is confirmed. - continue GDMT; start IV Lasix  Supraventricular tachycardia Supraventricular tachycardia with recent episodes of elevated tachycardia and chest discomfort. Resting heart rate is 73, but has had episodes of worsening heart rate. - BB right now is compensatory, no increase in BB  Acute myeloid leukemia in remission Acute myeloid leukemia in remission with outpatient follow-up with hematology. - Echo does not suggest AL-CA  Riley Lam, MD FASE [facility] Cardiologist [facility]  [address] [place], [address] [place], [place] [address] [phone]  4:45 PM

## 2023-11-09 NOTE — Progress Notes (Signed)
 Mobility Specialist Progress Note;   11/09/23 0940  Mobility  Activity Ambulated independently in hallway  Level of Assistance Standby assist, set-up cues, supervision of patient - no hands on  Assistive Device None  Distance Ambulated (ft) 1000 ft  Activity Response Tolerated well  Mobility Referral Yes  Mobility visit 1 Mobility  Mobility Specialist Start Time (ACUTE ONLY) 0940  Mobility Specialist Stop Time (ACUTE ONLY) 0950  Mobility Specialist Time Calculation (min) (ACUTE ONLY) 10 min   Pt eager for mobility. Required no physical assistance during ambulation, SV. HR up to 115bpm w/ activity. Only c/o legs feeling fatigued w/ further ambulation. Pt returned back to chair with all needs met.   Caesar Bookman Mobility Specialist Please contact via SecureChat or [employer] [phone]

## 2023-11-10 ENCOUNTER — Other Ambulatory Visit: Payer: Self-pay

## 2023-11-10 ENCOUNTER — Encounter (HOSPITAL_COMMUNITY)
Admission: EM | Disposition: A | Discharge status: Home / Self Care | Payer: Self-pay | Source: Home / Self Care | Attending: Internal Medicine

## 2023-11-10 ENCOUNTER — Ambulatory Visit: Admitting: Family Medicine

## 2023-11-10 DIAGNOSIS — I2510 Atherosclerotic heart disease of native coronary artery without angina pectoris: Secondary | ICD-10-CM

## 2023-11-10 DIAGNOSIS — I471 Supraventricular tachycardia, unspecified: Secondary | ICD-10-CM | POA: Diagnosis not present

## 2023-11-10 DIAGNOSIS — I340 Nonrheumatic mitral (valve) insufficiency: Secondary | ICD-10-CM | POA: Diagnosis not present

## 2023-11-10 DIAGNOSIS — I5023 Acute on chronic systolic (congestive) heart failure: Secondary | ICD-10-CM | POA: Diagnosis not present

## 2023-11-10 HISTORY — PX: RIGHT/LEFT HEART CATH AND CORONARY ANGIOGRAPHY: CATH118266

## 2023-11-10 LAB — RENAL FUNCTION PANEL
Albumin: 3.2 g/dL — ABNORMAL LOW (ref 3.5–5.0)
Anion gap: 15 (ref 5–15)
BUN: 32 mg/dL — ABNORMAL HIGH (ref 8–23)
CO2: 23 mmol/L (ref 22–32)
Calcium: 8.9 mg/dL (ref 8.9–10.3)
Chloride: 103 mmol/L (ref 98–111)
Creatinine, Ser: 1.57 mg/dL — ABNORMAL HIGH (ref 0.61–1.24)
GFR, Estimated: 48 mL/min — ABNORMAL LOW (ref >60)
Glucose, Bld: 119 mg/dL — ABNORMAL HIGH (ref 70–99)
Phosphorus: 4.4 mg/dL (ref 2.5–4.6)
Potassium: 3.5 mmol/L (ref 3.5–5.1)
Sodium: 141 mmol/L (ref 135–145)

## 2023-11-10 LAB — POCT I-STAT 7, (LYTES, BLD GAS, ICA,H+H)
Acid-base deficit: 1 mmol/L (ref 0.0–2.0)
Bicarbonate: 23.7 mmol/L (ref 20.0–28.0)
Calcium, Ion: 1.15 mmol/L (ref 1.15–1.40)
HCT: 38 % — ABNORMAL LOW (ref 39.0–52.0)
Hemoglobin: 12.9 g/dL — ABNORMAL LOW (ref 13.0–17.0)
O2 Saturation: 96 %
Potassium: 3.9 mmol/L (ref 3.5–5.1)
Sodium: 141 mmol/L (ref 135–145)
TCO2: 25 mmol/L (ref 22–32)
pCO2 arterial: 36.8 mmHg (ref 32–48)
pH, Arterial: 7.417 (ref 7.35–7.45)
pO2, Arterial: 81 mmHg — ABNORMAL LOW (ref 83–108)

## 2023-11-10 LAB — POCT I-STAT EG7
Acid-Base Excess: 0 mmol/L (ref 0.0–2.0)
Bicarbonate: 25.1 mmol/L (ref 20.0–28.0)
Calcium, Ion: 1.07 mmol/L — ABNORMAL LOW (ref 1.15–1.40)
HCT: 37 % — ABNORMAL LOW (ref 39.0–52.0)
Hemoglobin: 12.6 g/dL — ABNORMAL LOW (ref 13.0–17.0)
O2 Saturation: 64 %
Potassium: 3.7 mmol/L (ref 3.5–5.1)
Sodium: 144 mmol/L (ref 135–145)
TCO2: 26 mmol/L (ref 22–32)
pCO2, Ven: 42.1 mmHg — ABNORMAL LOW (ref 44–60)
pH, Ven: 7.383 (ref 7.25–7.43)
pO2, Ven: 34 mmHg (ref 32–45)

## 2023-11-10 LAB — TSH: TSH: 1.316 u[IU]/mL (ref 0.350–4.500)

## 2023-11-10 LAB — MAGNESIUM: Magnesium: 1.9 mg/dL (ref 1.7–2.4)

## 2023-11-10 SURGERY — RIGHT/LEFT HEART CATH AND CORONARY ANGIOGRAPHY
Anesthesia: LOCAL

## 2023-11-10 MED ORDER — HEPARIN (PORCINE) IN NACL 1000-0.9 UT/500ML-% IV SOLN
INTRAVENOUS | Status: DC | PRN
  Administered 2023-11-10 (×2): 500 mL

## 2023-11-10 MED ORDER — FENTANYL CITRATE (PF) 100 MCG/2ML IJ SOLN
INTRAMUSCULAR | Status: DC | PRN
  Administered 2023-11-10: 50 ug via INTRAVENOUS

## 2023-11-10 MED ORDER — METOPROLOL TARTRATE 5 MG/5ML IV SOLN
INTRAVENOUS | Status: AC
Start: 2023-11-10 — End: 2023-11-10
  Administered 2023-11-10: 5 mg
  Filled 2023-11-10: qty 10

## 2023-11-10 MED ORDER — HYDRALAZINE HCL 20 MG/ML IJ SOLN: 10.0000 mg | INTRAMUSCULAR | Status: AC | PRN

## 2023-11-10 MED ORDER — MIDAZOLAM HCL 2 MG/2ML IJ SOLN
INTRAMUSCULAR | Status: DC | PRN
  Administered 2023-11-10: 2 mg via INTRAVENOUS

## 2023-11-10 MED ORDER — LIDOCAINE HCL (PF) 1 % IJ SOLN
INTRAMUSCULAR | Status: DC | PRN
  Administered 2023-11-10: 2 mL
  Administered 2023-11-10: 1 mL

## 2023-11-10 MED ORDER — METOPROLOL TARTRATE 5 MG/5ML IV SOLN
5.0000 mg | Freq: Once | INTRAVENOUS | Status: DC | PRN
  Filled 2023-11-10: qty 5

## 2023-11-10 MED ORDER — HEPARIN SODIUM (PORCINE) 5000 UNIT/ML IJ SOLN
5000.0000 [IU] | Freq: Two times a day (BID) | INTRAMUSCULAR | Status: DC
  Administered 2023-11-11: 5000 [IU] via SUBCUTANEOUS
  Filled 2023-11-10: qty 1

## 2023-11-10 MED ORDER — VERAPAMIL HCL 2.5 MG/ML IV SOLN
INTRAVENOUS | Status: AC
Start: 2023-11-10 — End: ?
  Filled 2023-11-10: qty 2

## 2023-11-10 MED ORDER — HEPARIN SODIUM (PORCINE) 1000 UNIT/ML IJ SOLN
INTRAMUSCULAR | Status: DC | PRN
  Administered 2023-11-10: 4000 [IU] via INTRAVENOUS

## 2023-11-10 MED ORDER — POTASSIUM CHLORIDE CRYS ER 20 MEQ PO TBCR
40.0000 meq | EXTENDED_RELEASE_TABLET | Freq: Once | ORAL | Status: AC
  Administered 2023-11-10: 40 meq via ORAL
  Filled 2023-11-10: qty 2

## 2023-11-10 MED ORDER — METOPROLOL TARTRATE 5 MG/5ML IV SOLN: 5.0000 mg | Freq: Once | INTRAVENOUS | Status: DC | PRN

## 2023-11-10 MED ORDER — SODIUM CHLORIDE 0.9 % IV SOLN: INTRAVENOUS | Status: DC

## 2023-11-10 MED ORDER — HEPARIN SODIUM (PORCINE) 1000 UNIT/ML IJ SOLN
INTRAMUSCULAR | Status: AC
  Filled 2023-11-10: qty 10

## 2023-11-10 MED ORDER — FENTANYL CITRATE (PF) 100 MCG/2ML IJ SOLN
INTRAMUSCULAR | Status: AC
  Filled 2023-11-10: qty 2

## 2023-11-10 MED ORDER — SODIUM CHLORIDE 0.9% FLUSH
3.0000 mL | Freq: Two times a day (BID) | INTRAVENOUS | Status: DC
  Administered 2023-11-10 – 2023-11-11 (×2): 3 mL via INTRAVENOUS

## 2023-11-10 MED ORDER — LABETALOL HCL 5 MG/ML IV SOLN: 10.0000 mg | INTRAVENOUS | Status: AC | PRN

## 2023-11-10 MED ORDER — SODIUM CHLORIDE 0.9 % IV SOLN: 250.0000 mL | INTRAVENOUS | Status: DC | PRN

## 2023-11-10 MED ORDER — METOPROLOL SUCCINATE ER 25 MG PO TB24
37.5000 mg | ORAL_TABLET | Freq: Every day | ORAL | Status: DC
  Administered 2023-11-10 – 2023-11-11 (×2): 37.5 mg via ORAL
  Filled 2023-11-10 (×2): qty 2

## 2023-11-10 MED ORDER — MIDAZOLAM HCL 2 MG/2ML IJ SOLN
INTRAMUSCULAR | Status: AC
  Filled 2023-11-10: qty 2

## 2023-11-10 MED ORDER — SODIUM CHLORIDE 0.9% FLUSH: 3.0000 mL | INTRAVENOUS | Status: DC | PRN

## 2023-11-10 MED ORDER — VERAPAMIL HCL 2.5 MG/ML IV SOLN
INTRAVENOUS | Status: DC | PRN
  Administered 2023-11-10: 10 mL via INTRA_ARTERIAL

## 2023-11-10 MED ORDER — IOHEXOL 350 MG/ML SOLN
INTRAVENOUS | Status: DC | PRN
  Administered 2023-11-10: 35 mL

## 2023-11-10 MED ORDER — SODIUM CHLORIDE 0.9 % IV SOLN: INTRAVENOUS | Status: AC

## 2023-11-10 SURGICAL SUPPLY — 11 items
CATH 5FR JL3.5 JR4 ANG PIG MP (CATHETERS) IMPLANT
CATH BALLN WEDGE 5F 110CM (CATHETERS) IMPLANT
DEVICE RAD COMP TR BAND LRG (VASCULAR PRODUCTS) IMPLANT
GLIDESHEATH SLEND SS 6F .021 (SHEATH) IMPLANT
GUIDEWIRE .025 260CM (WIRE) IMPLANT
GUIDEWIRE INQWIRE 1.5J.035X260 (WIRE) IMPLANT
INQWIRE 1.5J .035X260CM (WIRE) ×1 IMPLANT
PACK CARDIAC CATHETERIZATION (CUSTOM PROCEDURE TRAY) ×2 IMPLANT
SET ATX-X65L (MISCELLANEOUS) IMPLANT
SHEATH GLIDE SLENDER 4/5FR (SHEATH) IMPLANT
WIRE MICROINTRODUCER 60CM (WIRE) IMPLANT

## 2023-11-10 NOTE — Significant Event (Signed)
 Nursing staff alerted me to the patient being tachycardic to the 160s. His tele shows him going in and out of an SVT. The patient reports palpitations but otherwise feels ok. I gave him 5 mg IV metoprolol x2 with some improvement in his heart rates. Will reassess to see if he will need amiodarone.

## 2023-11-10 NOTE — Plan of Care (Signed)
   Problem: Education: Goal: Knowledge of General Education information will improve Description Including pain rating scale, medication(s)/side effects and non-pharmacologic comfort measures Outcome: Progressing   Problem: Health Behavior/Discharge Planning: Goal: Ability to manage health-related needs will improve Outcome: Progressing

## 2023-11-10 NOTE — Progress Notes (Signed)
 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  2023 cardiac MRI with EF 36% -Diuresed with IV Lasix, 5.6 L negative, appears euvolemic now  -Cards following, holding Lasix Farxiga and Entresto today  -Plan for LHC  Moderate to severe mitral regurgitation -Noted on echo, plan for right and left heart cath today, possible TEE depending on those results  SVT -Toprol dose increased  GERD with hiatal hernia Will continue antiacid therapy  AML (acute myeloid leukemia) in remission (HCC) s/p BMT 2000 On remission, follow up as outpatient.   Dyslipidemia Continue with pravastatin   Hypokalemia -replaced  DVT prophylaxis: Hep SQ Code Status: Full Code Family Communication: none present Disposition Plan:   Consultants:    Procedures:   Antimicrobials:    Objective: Vitals:   11/09/23 2040 11/09/23 2345 11/10/23 0340 11/10/23 0430  BP:  120/87 124/84 126/84  Pulse:  82 (!) 120   Resp:  16 16   Temp:  98.4 F (36.9 C) 98.2 F (36.8 C)   TempSrc:  Oral Oral   SpO2:  91%    Weight: 73.5 kg     Height:        Intake/Output Summary (Last 24 hours) at 11/10/2023 1022 Last data filed at 11/09/2023 2000 Gross per 24 hour  Intake --  Output 4000 ml  Net -4000 ml   Filed Weights   11/08/23 0321 11/08/23 0654 11/09/23 2040  Weight: 76.7 kg 79.9 kg 73.5 kg    Examination:  General  exam: Appears calm and comfortable  HEENT: No JVD Respiratory system: Clear to auscultation Cardiovascular system: S1 & S2 heard, RRR.  Systolic murmur Abd: nondistended, soft and nontender.Normal bowel sounds heard. Central nervous system: Alert and oriented. No focal neurological deficits. Extremities: no edema Skin: No rashes Psychiatry:  Mood & affect appropriate.     Data Reviewed:   CBC: Recent Labs  Lab 11/08/23 0328 11/08/23 1035 11/08/23 1757 11/09/23 0617  WBC 6.0 5.0  --  5.9  HGB 11.2* 10.6* 12.9* 11.4*  HCT 34.2* 32.1* 39.0 34.0*  MCV 94.5 95.0  --  92.9  PLT 267 268  --  303   Basic Metabolic Panel: Recent Labs  Lab 11/08/23 0328 11/08/23 1035 11/09/23 0617 11/10/23 0550  NA 139  --  140 141  K 3.5  --  3.6 3.5  CL 106  --  106 103  CO2 22  --  22 23  GLUCOSE 111*  --  107* 119*  BUN 36*  --  27* 32*  CREATININE 1.31* 1.31* 1.47* 1.57*  CALCIUM 8.7*  --  8.6* 8.9  MG  --   --  1.9 1.9  PHOS  --   --   --  4.4   GFR: Estimated Creatinine Clearance: 46.5 mL/min (A) (by C-G formula based on SCr of 1.57 mg/dL (H)). Liver Function Tests: Recent Labs  Lab 11/08/23 0328 11/10/23 0550  AST 40  --  ALT 60*  --   ALKPHOS 67  --   BILITOT 0.4  --   PROT 6.6  --   ALBUMIN 3.9 3.2*   Recent Labs  Lab 11/08/23 0328  LIPASE 27   No results for input(s): "AMMONIA" in the last 168 hours. Coagulation Profile: No results for input(s): "INR", "PROTIME" in the last 168 hours. Cardiac Enzymes: No results for input(s): "CKTOTAL", "CKMB", "CKMBINDEX", "TROPONINI" in the last 168 hours. BNP (last 3 results) No results for input(s): "PROBNP" in the last 8760 hours. HbA1C: No results for input(s): "HGBA1C" in the last 72 hours. CBG: No results for input(s): "GLUCAP" in the last 168 hours. Lipid Profile: No results for input(s): "CHOL", "HDL", "LDLCALC", "TRIG", "CHOLHDL", "LDLDIRECT" in the last 72 hours. Thyroid Function Tests: No results for  input(s): "TSH", "T4TOTAL", "FREET4", "T3FREE", "THYROIDAB" in the last 72 hours. Anemia Panel: No results for input(s): "VITAMINB12", "FOLATE", "FERRITIN", "TIBC", "IRON", "RETICCTPCT" in the last 72 hours. Urine analysis:    Component Value Date/Time   COLORURINE YELLOW 12/14/2012 1748   APPEARANCEUR CLEAR 12/14/2012 1748   LABSPEC 1.019 12/14/2012 1748   PHURINE 6.5 12/14/2012 1748   GLUCOSEU NEGATIVE 12/14/2012 1748   GLUCOSEU NEGATIVE 01/24/2009 0748   HGBUR NEGATIVE 12/14/2012 1748   HGBUR negative 01/29/2010 0843   BILIRUBINUR n 10/03/2016 0911   KETONESUR negative 09/21/2014 0900   KETONESUR NEGATIVE 12/14/2012 1748   PROTEINUR n 10/03/2016 0911   PROTEINUR NEGATIVE 12/14/2012 1748   UROBILINOGEN 0.2 10/03/2016 0911   UROBILINOGEN 0.2 12/14/2012 1748   NITRITE n 10/03/2016 0911   NITRITE NEGATIVE 12/14/2012 1748   LEUKOCYTESUR Negative 10/03/2016 0911   Sepsis Labs: @LABRCNTIP (procalcitonin:4,lacticidven:4)  )No results found for this or any previous visit (from the past 240 hours).   Radiology Studies: ECHOCARDIOGRAM COMPLETE Result Date: 11/08/2023    ECHOCARDIOGRAM REPORT   Patient Name:   [name] Date of Exam: 11/08/2023 Medical Rec #:  [id number]       Height:       70.0 in Accession #:    [id number]      Weight:       176.2 lb Date of Birth:  1956-06-27      BSA:          1.978 m Patient Age:    67 years        BP:           147/102 mmHg Patient Gender: M               HR:           88 bpm. Exam Location:  Inpatient Procedure: 2D Echo, Cardiac Doppler and Color Doppler (Both Spectral and Color            Flow Doppler were utilized during procedure). Indications:    Dyspnea R06.00  History:        Patient has prior history of Echocardiogram examinations, most                 recent 03/18/2012. CHF; Risk Factors:Hypertension and                 Dyslipidemia.  Sonographer:    Lucendia Herrlich RCS Referring Phys: [id number] MAURICIO DANIEL ARRIEN IMPRESSIONS  1. While  there is global left ventricular hypokinesis, the inferior and inferolateral walls have disproportionately severe hypokinesis, suggesting right coronary or left circumflex artery ischemia. There is posterior mitral leaflet tethering. Left ventricular ejection fraction, by estimation, is 30 to  35%. The left ventricle has moderately decreased function. The left ventricle demonstrates global hypokinesis. The left ventricular internal cavity size was mildly dilated. Left ventricular diastolic  parameters are consistent with Grade II diastolic dysfunction (pseudonormalization). Elevated left atrial pressure.  2. Right ventricular systolic function is low normal. The right ventricular size is normal. There is mildly elevated pulmonary artery systolic pressure. The estimated right ventricular systolic pressure is 36.5 mmHg.  3. Left atrial size was mildly dilated.  4. There is posterior mitral leaflet tethering. The regurgitant jet originates primarily at the A2-P2 commisureBy the PISA method, the effective regurgitant orifice area is 0.27 cm sq, regurgitant volume 35 ml, regurgitant fraction 45%. MR vena contracta is 5 mm. The mitral valve area by planimetry is 6.25 cm sq. The mitral valve is abnormal. Moderate to severe mitral valve regurgitation. The mean mitral valve gradient is 2.0 mmHg with average heart rate of 82 bpm.  5. The aortic valve is tricuspid. Aortic valve regurgitation is trivial.  6. The inferior vena cava is dilated in size with >50% respiratory variability, suggesting right atrial pressure of 8 mmHg. Comparison(s): A prior study was performed on 2013. Prior images unable to be directly viewed, comparison made by report only. Mitral insufficiency appears to be substantially worse and LVEF is lower. Overall impression suggests coronary insufficiency superimposed on preexisting nonischemiccardiomyopathy. Conclusion(s)/Recommendation(s): Consider TEE for MR evaluation. Consider repeat ischemic evaluation.  FINDINGS  Left Ventricle: While there is global left ventricular hypokinesis, the inferior and inferolateral walls have disproportionately severe hypokinesis, suggesting right coronary or left circumflex artery ischemia. There is posterior mitral leaflet tethering. Left ventricular ejection fraction, by estimation, is 30 to 35%. The left ventricle has moderately decreased function. The left ventricle demonstrates global hypokinesis. The left ventricular internal cavity size was mildly dilated. There is no left ventricular hypertrophy. Left ventricular diastolic parameters are consistent with Grade II diastolic dysfunction (pseudonormalization). Elevated left atrial pressure.  LV Wall Scoring: The inferior wall and posterior wall are disproportionately severely hypokinetic, but all wall segments are hypokinetic. Right Ventricle: The right ventricular size is normal. No increase in right ventricular wall thickness. Right ventricular systolic function is low normal. There is mildly elevated pulmonary artery systolic pressure. The tricuspid regurgitant velocity is 2.67 m/s, and with an assumed right atrial pressure of 8 mmHg, the estimated right ventricular systolic pressure is 36.5 mmHg. Left Atrium: Left atrial size was mildly dilated. Right Atrium: Right atrial size was normal in size. Pericardium: There is no evidence of pericardial effusion. Mitral Valve: There is posterior mitral leaflet tethering. The regurgitant jet originates primarily at the A2-P2 commisureBy the PISA method, the effective regurgitant orifice area is 0.27 cm sq, regurgitant volume 35 ml, regurgitant fraction 45%. MR vena contracta is 5 mm. The mitral valve area by planimetry is 6.25 cm sq. The mitral valve is abnormal. Moderate to severe mitral valve regurgitation, with eccentric posteriorly directed jet. The mean mitral valve gradient is 2.0 mmHg with average heart  rate of 82 bpm. Tricuspid Valve: The tricuspid valve is normal in structure.  Tricuspid valve regurgitation is mild. Aortic Valve: The aortic valve is tricuspid. Aortic valve regurgitation is trivial. Aortic valve peak gradient measures 3.8 mmHg. Pulmonic Valve: The pulmonic valve was grossly normal. Pulmonic valve regurgitation is not visualized. No evidence of pulmonic stenosis. Aorta: The aortic root and ascending aorta are structurally normal, with no evidence of dilitation. Venous: A pattern of systolic flow reversal, suggestive of severe mitral regurgitation is recorded from the right  upper pulmonary vein. The inferior vena cava is dilated in size with greater than 50% respiratory variability, suggesting right atrial pressure of 8 mmHg. IAS/Shunts: No atrial level shunt detected by color flow Doppler.  LEFT VENTRICLE PLAX 2D LVIDd:         5.60 cm      Diastology LVIDs:         4.47 cm      LV e' medial:    6.09 cm/s LV PW:         1.00 cm      LV E/e' medial:  13.4 LV IVS:        1.10 cm      LV e' lateral:   11.50 cm/s LVOT diam:     2.30 cm      LV E/e' lateral: 7.1 LV SV:         41 LV SV Index:   21 LVOT Area:     4.15 cm  LV Volumes (MOD) LV vol d, MOD A2C: 154.0 ml LV vol d, MOD A4C: 127.0 ml LV vol s, MOD A2C: 102.0 ml LV vol s, MOD A4C: 85.4 ml LV SV MOD A2C:     52.0 ml LV SV MOD A4C:     127.0 ml LV SV MOD BP:      49.5 ml RIGHT VENTRICLE             IVC RV S prime:     11.20 cm/s  IVC diam: 2.60 cm TAPSE (M-mode): 1.4 cm LEFT ATRIUM             Index        RIGHT ATRIUM           Index LA diam:        4.10 cm 2.07 cm/m   RA Area:     11.50 cm LA Vol (A2C):   43.5 ml 21.99 ml/m  RA Volume:   24.30 ml  12.29 ml/m LA Vol (A4C):   47.1 ml 23.81 ml/m LA Biplane Vol: 45.5 ml 23.00 ml/m  AORTIC VALVE AV Area (Vmax): 2.75 cm AV Vmax:        97.50 cm/s AV Peak Grad:   3.8 mmHg LVOT Vmax:      64.60 cm/s LVOT Vmean:     40.900 cm/s LVOT VTI:       0.099 m  AORTA Ao Root diam: 3.30 cm Ao Asc diam:  3.20 cm MITRAL VALVE                  TRICUSPID VALVE MV Area (PHT): 4.49 cm        TR Peak grad:   28.5 mmHg MV Mean grad:  2.0 mmHg       TR Vmax:        267.00 cm/s MV Decel Time: 169 msec MR Peak grad:    81.8 mmHg    SHUNTS MR Mean grad:    55.0 mmHg    Systemic VTI:  0.10 m MR Vmax:         452.33 cm/s  Systemic Diam: 2.30 cm MR Vmean:        353.0 cm/s MR PISA:         3.82 cm MR PISA Eff ROA: 27 mm MR PISA Radius:  0.78 cm MV E velocity: 81.80 cm/s MV A velocity: 39.50 cm/s MV E/A ratio:  2.07 Mihai Croitoru MD Electronically signed by Thurmon Fair MD Signature Date/Time: 11/08/2023/3:42:13 PM  Final      Scheduled Meds:  fluticasone  1 spray Each Nare Daily   heparin injection (subcutaneous)  5,000 Units Subcutaneous Q12H   loratadine  10 mg Oral Daily   metoprolol succinate  37.5 mg Oral Daily   montelukast  10 mg Oral Daily   pantoprazole  40 mg Oral BID   potassium chloride  40 mEq Oral Once   pravastatin  20 mg Oral q1800   sertraline  50 mg Oral Daily   valACYclovir  500 mg Oral BID   Continuous Infusions:  sodium chloride 50 mL/hr at 11/10/23 0939     LOS: 2 days    Time spent:    Zannie Cove, MD [facility]   11/10/2023, 10:22 AM

## 2023-11-10 NOTE — TOC Initial Note (Signed)
 Transition of Care [facility]) - Initial/Assessment Note    Patient Details  Name: [name] MRN: [id number] Date of Birth: June 06, 1956  Transition of Care [facility]) CM/SW Contact:    Gala Lewandowsky, RN Phone Number: 11/10/2023, 2:58 PM  Clinical Narrative:  Patient presented for abdominal pain. PTA patient was independent from home with spouse. No home needs identified at this time.                Expected Discharge Plan: Home/Self Care Barriers to Discharge: No Barriers Identified   Patient Goals and CMS Choice Patient states their goals for this hospitalization and ongoing recovery are:: plan to return home once stable.  Expected Discharge Plan and Services   Discharge Planning Services: CM Consult Post Acute Care Choice: NA Living arrangements for the past 2 months: Single Family Home                   DME Agency: NA  Prior Living Arrangements/Services Living arrangements for the past 2 months: Single Family Home Lives with:: Spouse Patient language and need for interpreter reviewed:: Yes Do you feel safe going back to the place where you live?: Yes        Care giver support system in place?: No (comment)   Criminal Activity/Legal Involvement Pertinent to Current Situation/Hospitalization: No - Comment as needed  Activities of Daily Living   ADL Screening (condition at time of admission) Independently performs ADLs?: Yes (appropriate for developmental age) Is the patient deaf or have difficulty hearing?: No Does the patient have difficulty seeing, even when wearing glasses/contacts?: No Does the patient have difficulty concentrating, remembering, or making decisions?: No  Permission Sought/Granted Permission sought to share information with : Family Supports, Case Manager   Emotional Assessment Appearance:: Appears stated age Attitude/Demeanor/Rapport: Engaged Affect (typically observed): Appropriate Orientation: : Oriented to Self, Oriented to Place,  Oriented to  Time, Oriented to Situation Alcohol / Substance Use: Not Applicable Psych Involvement: No (comment)  Admission diagnosis:  Epigastric pain [R10.13] Elevated troponin [R79.89] Patient Active Problem List   Diagnosis Date Noted   Elevated troponin 11/08/2023   Acute on chronic systolic CHF (congestive heart failure) (HCC) 11/08/2023   Hypokalemia 11/08/2023   Osteopenia last DEXA 2023 07/08/2022   Recurrent sinusitis 05/08/2022   Heart failure with mid-range ejection fraction (HCC) 12/13/2020   Hyperglycemia 11/28/2020   BPH (benign prostatic hyperplasia) 11/27/2020   Myelodysplasia (myelodysplastic syndrome) s/p BMT 2020 04/08/2019   Anemia 04/08/2019   Seasonal allergies 09/23/2018   Anxiety 09/23/2018   History of cold sores 09/23/2018   AML (acute myeloid leukemia) in remission [facility]) s/p BMT 2000 09/28/2014   GERD with hiatal hernia 04/21/2011   Erectile dysfunction 11/15/2009   Essential hypertension, benign 06/01/2007   Dyslipidemia 05/26/2007   PCP:  Ardith Dark, MD Pharmacy:   [facility] [id number] - [place], [place] - [address] [place] [address] Phone: [phone] Fax: [phone]  Social Drivers of Health (SDOH) Social History: SDOH Screenings   Food Insecurity: No Food Insecurity (11/08/2023)  Housing: Low Risk  (11/08/2023)  Transportation Needs: No Transportation Needs (11/08/2023)  Utilities: Not At Risk (11/08/2023)  Depression (PHQ2-9): Low Risk  (07/23/2023)  Financial Resource Strain: Low Risk  (01/20/2023)  Physical Activity: Sufficiently Active (01/20/2023)  Social Connections: Socially Integrated (11/08/2023)  Stress: No Stress Concern Present (01/20/2023)  Tobacco Use: Low Risk  (11/08/2023)    Readmission Risk Interventions    11/10/2023    2:56  PM  Readmission Risk Prevention Plan  Transportation Screening Complete  HRI or Home Care Consult Complete  Social Work Consult for Recovery Care  Planning/Counseling Complete  Palliative Care Screening Not Applicable  Medication Review [employer]) Referral to Pharmacy

## 2023-11-10 NOTE — H&P (View-Only) (Signed)
 Patient Name: [name] Date of Encounter: 11/10/2023 [facility] Cardiologist: Charlton Haws, MD   Interval Summary  .    Overnight has had a significant amount of SVT with concerns of atrial fibrillation and flutter.  Mildly symptomatic just overall does not feel well when these occur.  No significant chest pain or shortness of breath.  Creatinine slightly elevated and however we will continue to plan for cardiac catheterization.  Vital Signs .    Vitals:   11/09/23 2040 11/09/23 2345 11/10/23 0340 11/10/23 0430  BP:  120/87 124/84 126/84  Pulse:  82 (!) 120   Resp:  16 16   Temp:  98.4 F (36.9 C) 98.2 F (36.8 C)   TempSrc:  Oral Oral   SpO2:  91%    Weight: 73.5 kg     Height:        Intake/Output Summary (Last 24 hours) at 11/10/2023 0859 Last data filed at 11/09/2023 2000 Gross per 24 hour  Intake --  Output 4000 ml  Net -4000 ml      11/09/2023    8:40 PM 11/08/2023    6:54 AM 11/08/2023    3:21 AM  Last 3 Weights  Weight (lbs) 162 lb 0.6 oz 176 lb 3.2 oz 169 lb  Weight (kg) 73.5 kg 79.924 kg 76.658 kg      Telemetry/ECG    SVT, atrial flutter/fib?- Personally Reviewed  CV Studies    Echocardiogram 11/08/2023 1. While there is global left ventricular hypokinesis, the inferior and  inferolateral walls have disproportionately severe hypokinesis, suggesting  right coronary or left circumflex artery ischemia. There is posterior  mitral leaflet tethering. Left  ventricular ejection fraction, by estimation, is 30 to 35%. The left  ventricle has moderately decreased function. The left ventricle  demonstrates global hypokinesis. The left ventricular internal cavity size  was mildly dilated. Left ventricular diastolic   parameters are consistent with Grade II diastolic dysfunction  (pseudonormalization). Elevated left atrial pressure.   2. Right ventricular systolic function is low normal. The right  ventricular size is normal. There is mildly  elevated pulmonary artery  systolic pressure. The estimated right ventricular systolic pressure is  36.5 mmHg.   3. Left atrial size was mildly dilated.   4. There is posterior mitral leaflet tethering. The regurgitant jet  originates primarily at the A2-P2 commisureBy the PISA method, the  effective regurgitant orifice area is 0.27 cm sq, regurgitant volume 35  ml, regurgitant fraction 45%. MR vena  contracta is 5 mm. The mitral valve area by planimetry is 6.25 cm sq. The  mitral valve is abnormal. Moderate to severe mitral valve regurgitation.  The mean mitral valve gradient is 2.0 mmHg with average heart rate of 82  bpm.   5. The aortic valve is tricuspid. Aortic valve regurgitation is trivial.   6. The inferior vena cava is dilated in size with >50% respiratory  variability, suggesting right atrial pressure of 8 mmHg.   Comparison(s): A prior study was performed on 2013. Prior images unable to  be directly viewed, comparison made by report only. Mitral insufficiency  appears to be substantially worse and LVEF is lower. Overall impression  suggests coronary insufficiency  superimposed on preexisting nonischemiccardiomyopathy.    cMRI 10/07/2021 1. Normal LV size and wall thickness, diffuse hypokinesis with EF 36%.   2.  Normal RV size with moderate hypokinesis, EF 35%.   3. Non-coronary somewhat subtle LGE pattern in the basal septum. Mid-wall LGE most  suggestive of prior myocarditis. Elevated extracellular volume percentage in the basal septum suggestive of fibrosis. Basal septal T2 not elevated, doubt active inflammatio  Physical Exam .   GEN: No acute distress.   Neck: No JVD Cardiac: RRR, 2/6 murmur Respiratory: Clear to auscultation bilaterally. GI: Soft, nontender, non-distended  MS: No edema  Patient Profile    [name] is a 68 y.o. male has hx of  chronic HFrEF, NICM, nonobstructive CAD based off CT 2022, bifascicular block, hypertension, AML in remission  status post BMT 2000, MDS, hyperlipidemia.  Admitted for CHF found to have new moderate to severe MR.  Assessment & Plan .     Chronic HFrEF Nonischemic cardiomyopathy -09/2021 cMRI LVEF 36%, LGE in basal septum consistent with previous myocarditis.   Echo here shows EF 30 to 35% with inferior/inferolateral wall severe hypokinesis concerning for RCA/LCx ischemia. G2DD.  EKG no acute STTW changes and trops minimally elevated likely demand ischemia. Normally well compensated however suspect current presentation likely attributed to concerns of ischemic MR.   Doing well, overall looks to be euvolemic.  Having rising creatinine and BUN likely suggesting sufficient diuresis.  Will stop IV diuretics for today, hydrate with IV fluids given catheterization/contrast today.  PTA GDMT: On Farxiga 10 mg, Toprol-XL 25 mg, Entresto 97-103 mg twice daily. May consider addition of spiro once renal function normalizes.  For today hold [place] given renal function. May be candidate for CRT. EF continues to be persistently low despite optimization with GDMT.    New moderate-severe MR Findings seems suggested of ischemic MR. Has posterior mitral leaflet tethering. Regurgitant orifice area is 0.27 cm sq, regurgitant volume 35 ml, regurgitant fraction 45%. MR vena contracta is 5 mm. The mitral valve area by planimetry is 6.25 cm sq. Generally very functional however since last week he has noticed a new decrease in exercise intolerance.    Plan for right left heart catheterization today. He's okay with Dr. Clifton James performing procedure.   Informed Consent   Shared Decision Making/Informed Consent The risks [stroke (1 in 1000), death (1 in 1000), kidney failure [usually temporary] (1 in 500), bleeding (1 in 200), allergic reaction [possibly serious] (1 in 200)], benefits (diagnostic support and management of coronary artery disease) and alternatives of a cardiac catheterization were discussed in detail  with Mr. Denz and he is willing to proceed.     Needs R/LHC 1st to try and fix potential reversible ischemia. Then could consider repeat echo vs TEE depending on those results.    Supraventricular tachycardia Since yesterday has had recurrent episodes with heart rates as high as 160.  Also other arrhythmia suspicious for atrial fibrillation/flutter.   Will have MD review.  For now we will increase his Toprol-XL to 37.5.  Will await further recommendations by MD.  May benefit from amiodarone in the short-term to prevent tachycardic spells during cardiac catheterization. Check TSH Giving of K   Nonobstructive CAD Hyperlipidemia Noted on previous CCTA in 2022.  Cath as above. LDL 53 3 months ago.  Continue pravastatin 20 mg   Bifascicular block Need to continue to monitor this closely given he is also on beta-blocker for GDMT.  Asymptomatic.  AML in remission Echo not suggestive of AL  For questions or updates, please contact [facility] Please consult [email or web address] for contact info under        Signed, Abagail Kitchens, PA-C    I have personally seen and examined the patient.  My HPI,  Exam, and assessment and plan are below, independent of the NPP above.  Patient overall feels better. He was symptomatic of arrhythmia.  He can tell he lost several pounds- feel back to baseline from that perspective.    Exam notable for  Gen: no distress   Neck: No HJR at 90 degrees, no carotid bruit Cardiac: No Rubs or Gallops, systolic Murmur, RRR Rates 70s Respiratory: Clear to auscultation bilaterally, normal effort, normal  respiratory rate GI: Soft, nontender, non-distended  MS: No  edema;  moves all extremities Integument: Skin feels warm Neuro:  At time of evaluation, alert and oriented to person/place/time/situation  Psych: Normal affect, patient feels ok  Tele: Paroxsymal of AVNRT, a few irregular beats, present in SR  In assessment and plan:   Acute on Chronic  HFrEF - near euvolemic  CKD III a at baseline, holding ARNI  and SGTL2i for cath - LHC and RHC today; consented, he is amenable for any high quality IC operator, discussed with CMAC and MC this AM Consented for TEE, likely tomorrow, unless V wave is very not suggestive of MR RBBB and LAFB- monitor Mod/severe MR; TEE as above  AVNRT - increasing BB  Non-obstructive CAD HLD - pravastatin 20 ; cath as above   AML in remission Echo not suggestive of AL-CA  Discussed at length with patient and wife; will discuss post cath procedural findings   Riley Lam, MD FASE [facility] Cardiologist [facility]  [facility]  [address] [place], [address] [place], [place] [address] [phone]  12:13 PM

## 2023-11-10 NOTE — Interval H&P Note (Signed)
 History and Physical Interval Note:  11/10/2023 3:28 PM  [name]  has presented today for surgery, with the diagnosis of mr.  The various methods of treatment have been discussed with the patient and family. After consideration of risks, benefits and other options for treatment, the patient has consented to  Procedure(s): RIGHT/LEFT HEART CATH AND CORONARY ANGIOGRAPHY (N/A) as a surgical intervention.  The patient's history has been reviewed, patient examined, no change in status, stable for surgery.  I have reviewed the patient's chart and labs.  Questions were answered to the patient's satisfaction.    Cath Lab Visit (complete for each Cath Lab visit)  Clinical Evaluation Leading to the Procedure:   ACS: No.  Non-ACS:    Anginal Classification: CCS II  Anti-ischemic medical therapy: Minimal Therapy (1 class of medications)  Non-Invasive Test Results: No non-invasive testing performed  Prior CABG: No previous CABG    Verne Carrow

## 2023-11-10 NOTE — Progress Notes (Addendum)
 Patient Name: [name] Date of Encounter: 11/10/2023 [facility] Cardiologist: Charlton Haws, MD   Interval Summary  .    Overnight has had a significant amount of SVT with concerns of atrial fibrillation and flutter.  Mildly symptomatic just overall does not feel well when these occur.  No significant chest pain or shortness of breath.  Creatinine slightly elevated and however we will continue to plan for cardiac catheterization.  Vital Signs .    Vitals:   11/09/23 2040 11/09/23 2345 11/10/23 0340 11/10/23 0430  BP:  120/87 124/84 126/84  Pulse:  82 (!) 120   Resp:  16 16   Temp:  98.4 F (36.9 C) 98.2 F (36.8 C)   TempSrc:  Oral Oral   SpO2:  91%    Weight: 73.5 kg     Height:        Intake/Output Summary (Last 24 hours) at 11/10/2023 0859 Last data filed at 11/09/2023 2000 Gross per 24 hour  Intake --  Output 4000 ml  Net -4000 ml      11/09/2023    8:40 PM 11/08/2023    6:54 AM 11/08/2023    3:21 AM  Last 3 Weights  Weight (lbs) 162 lb 0.6 oz 176 lb 3.2 oz 169 lb  Weight (kg) 73.5 kg 79.924 kg 76.658 kg      Telemetry/ECG    SVT, atrial flutter/fib?- Personally Reviewed  CV Studies    Echocardiogram 11/08/2023 1. While there is global left ventricular hypokinesis, the inferior and  inferolateral walls have disproportionately severe hypokinesis, suggesting  right coronary or left circumflex artery ischemia. There is posterior  mitral leaflet tethering. Left  ventricular ejection fraction, by estimation, is 30 to 35%. The left  ventricle has moderately decreased function. The left ventricle  demonstrates global hypokinesis. The left ventricular internal cavity size  was mildly dilated. Left ventricular diastolic   parameters are consistent with Grade II diastolic dysfunction  (pseudonormalization). Elevated left atrial pressure.   2. Right ventricular systolic function is low normal. The right  ventricular size is normal. There is mildly  elevated pulmonary artery  systolic pressure. The estimated right ventricular systolic pressure is  36.5 mmHg.   3. Left atrial size was mildly dilated.   4. There is posterior mitral leaflet tethering. The regurgitant jet  originates primarily at the A2-P2 commisureBy the PISA method, the  effective regurgitant orifice area is 0.27 cm sq, regurgitant volume 35  ml, regurgitant fraction 45%. MR vena  contracta is 5 mm. The mitral valve area by planimetry is 6.25 cm sq. The  mitral valve is abnormal. Moderate to severe mitral valve regurgitation.  The mean mitral valve gradient is 2.0 mmHg with average heart rate of 82  bpm.   5. The aortic valve is tricuspid. Aortic valve regurgitation is trivial.   6. The inferior vena cava is dilated in size with >50% respiratory  variability, suggesting right atrial pressure of 8 mmHg.   Comparison(s): A prior study was performed on 2013. Prior images unable to  be directly viewed, comparison made by report only. Mitral insufficiency  appears to be substantially worse and LVEF is lower. Overall impression  suggests coronary insufficiency  superimposed on preexisting nonischemiccardiomyopathy.    cMRI 10/07/2021 1. Normal LV size and wall thickness, diffuse hypokinesis with EF 36%.   2.  Normal RV size with moderate hypokinesis, EF 35%.   3. Non-coronary somewhat subtle LGE pattern in the basal septum. Mid-wall LGE most  suggestive of prior myocarditis. Elevated extracellular volume percentage in the basal septum suggestive of fibrosis. Basal septal T2 not elevated, doubt active inflammatio  Physical Exam .   GEN: No acute distress.   Neck: No JVD Cardiac: RRR, 2/6 murmur Respiratory: Clear to auscultation bilaterally. GI: Soft, nontender, non-distended  MS: No edema  Patient Profile    [name] is a 68 y.o. male has hx of  chronic HFrEF, NICM, nonobstructive CAD based off CT 2022, bifascicular block, hypertension, AML in remission  status post BMT 2000, MDS, hyperlipidemia.  Admitted for CHF found to have new moderate to severe MR.  Assessment & Plan .     Chronic HFrEF Nonischemic cardiomyopathy -09/2021 cMRI LVEF 36%, LGE in basal septum consistent with previous myocarditis.   Echo here shows EF 30 to 35% with inferior/inferolateral wall severe hypokinesis concerning for RCA/LCx ischemia. G2DD.  EKG no acute STTW changes and trops minimally elevated likely demand ischemia. Normally well compensated however suspect current presentation likely attributed to concerns of ischemic MR.   Doing well, overall looks to be euvolemic.  Having rising creatinine and BUN likely suggesting sufficient diuresis.  Will stop IV diuretics for today, hydrate with IV fluids given catheterization/contrast today.  PTA GDMT: On Farxiga 10 mg, Toprol-XL 25 mg, Entresto 97-103 mg twice daily. May consider addition of spiro once renal function normalizes.  For today hold [place] given renal function. May be candidate for CRT. EF continues to be persistently low despite optimization with GDMT.    New moderate-severe MR Findings seems suggested of ischemic MR. Has posterior mitral leaflet tethering. Regurgitant orifice area is 0.27 cm sq, regurgitant volume 35 ml, regurgitant fraction 45%. MR vena contracta is 5 mm. The mitral valve area by planimetry is 6.25 cm sq. Generally very functional however since last week he has noticed a new decrease in exercise intolerance.    Plan for right left heart catheterization today. He's okay with Dr. Clifton James performing procedure.   Informed Consent   Shared Decision Making/Informed Consent The risks [stroke (1 in 1000), death (1 in 1000), kidney failure [usually temporary] (1 in 500), bleeding (1 in 200), allergic reaction [possibly serious] (1 in 200)], benefits (diagnostic support and management of coronary artery disease) and alternatives of a cardiac catheterization were discussed in detail  with [name] and he is willing to proceed.     Needs R/LHC 1st to try and fix potential reversible ischemia. Then could consider repeat echo vs TEE depending on those results.    Supraventricular tachycardia Since yesterday has had recurrent episodes with heart rates as high as 160.  Also other arrhythmia suspicious for atrial fibrillation/flutter.   Will have MD review.  For now we will increase his Toprol-XL to 37.5.  Will await further recommendations by MD.  May benefit from amiodarone in the short-term to prevent tachycardic spells during cardiac catheterization. Check TSH Giving of K   Nonobstructive CAD Hyperlipidemia Noted on previous CCTA in 2022.  Cath as above. LDL 53 3 months ago.  Continue pravastatin 20 mg   Bifascicular block Need to continue to monitor this closely given he is also on beta-blocker for GDMT.  Asymptomatic.  AML in remission Echo not suggestive of AL  For questions or updates, please contact [facility] Please consult [email or web address] for contact info under        Signed, Abagail Kitchens, PA-C    I have personally seen and examined the patient.  My HPI,  Exam, and assessment and plan are below, independent of the NPP above.  Patient overall feels better. He was symptomatic of arrhythmia.  He can tell he lost several pounds- feel back to baseline from that perspective.    Exam notable for  Gen: no distress   Neck: No HJR at 90 degrees, no carotid bruit Cardiac: No Rubs or Gallops, systolic Murmur, RRR Rates 70s Respiratory: Clear to auscultation bilaterally, normal effort, normal  respiratory rate GI: Soft, nontender, non-distended  MS: No  edema;  moves all extremities Integument: Skin feels warm Neuro:  At time of evaluation, alert and oriented to person/place/time/situation  Psych: Normal affect, patient feels ok  Tele: Paroxsymal of AVNRT, a few irregular beats, present in SR  In assessment and plan:   Acute on Chronic  HFrEF - near euvolemic  CKD III a at baseline, holding ARNI  and SGTL2i for cath - LHC and RHC today; consented, he is amenable for any high quality IC operator, discussed with CMAC and MC this AM Consented for TEE, likely tomorrow, unless V wave is very not suggestive of MR RBBB and LAFB- monitor Mod/severe MR; TEE as above  AVNRT - increasing BB  Non-obstructive CAD HLD - pravastatin 20 ; cath as above   AML in remission Echo not suggestive of AL-CA  Discussed at length with patient and wife; will discuss post cath procedural findings   Riley Lam, MD FASE [facility] Cardiologist [facility]  [facility]  [address] [place], [address] [place], [place] [address] [phone]  12:13 PM   Patient feels better. No significant CAD - RA pressure normal. Has evidence of elevated PWCP- I suspect Notable V wave. I suspect he has posterior MR that is significant. We will get TEE tomorrow. Would eventually need minimally invasive surgery for mitral.  Riley Lam, MD FASE [facility] Cardiologist [facility]  [address] [place], [address] [place], [place] [address] [phone]  6:02 PM

## 2023-11-10 NOTE — Progress Notes (Signed)
   [facility] has been requested to perform a transesophageal echocardiogram on [name] for mitral regurgitation.  After careful review of history and examination, the risks and benefits of transesophageal echocardiogram have been explained including risks of esophageal damage, perforation (1:10,000 risk), bleeding, pharyngeal hematoma as well as other potential complications associated with conscious sedation including aspiration, arrhythmia, respiratory failure and death. Alternatives to treatment were discussed, questions were answered. Patient is willing to proceed.   No openings currently, making NPO in case a spot opens up for tomorrow. Also decision pending based off of cath.   Abagail Kitchens, PA-C  11/10/2023 2:33 PM

## 2023-11-11 ENCOUNTER — Inpatient Hospital Stay (HOSPITAL_COMMUNITY)
Admit: 2023-11-11 | Discharge: 2023-11-11 | Disposition: A | Discharge status: Home / Self Care | Attending: Internal Medicine | Admitting: Internal Medicine

## 2023-11-11 ENCOUNTER — Inpatient Hospital Stay (HOSPITAL_COMMUNITY): Admitting: Registered Nurse

## 2023-11-11 ENCOUNTER — Encounter (HOSPITAL_COMMUNITY): Payer: Self-pay | Admitting: Cardiovascular Disease

## 2023-11-11 ENCOUNTER — Other Ambulatory Visit (HOSPITAL_COMMUNITY): Payer: Self-pay

## 2023-11-11 ENCOUNTER — Encounter (HOSPITAL_COMMUNITY)
Admission: EM | Disposition: A | Discharge status: Home / Self Care | Payer: Self-pay | Source: Home / Self Care | Attending: Internal Medicine

## 2023-11-11 DIAGNOSIS — K219 Gastro-esophageal reflux disease without esophagitis: Secondary | ICD-10-CM | POA: Diagnosis not present

## 2023-11-11 DIAGNOSIS — I471 Supraventricular tachycardia, unspecified: Secondary | ICD-10-CM | POA: Diagnosis not present

## 2023-11-11 DIAGNOSIS — I340 Nonrheumatic mitral (valve) insufficiency: Secondary | ICD-10-CM

## 2023-11-11 DIAGNOSIS — C9201 Acute myeloblastic leukemia, in remission: Secondary | ICD-10-CM | POA: Diagnosis not present

## 2023-11-11 DIAGNOSIS — F418 Other specified anxiety disorders: Secondary | ICD-10-CM | POA: Diagnosis not present

## 2023-11-11 DIAGNOSIS — I509 Heart failure, unspecified: Secondary | ICD-10-CM

## 2023-11-11 DIAGNOSIS — I110 Hypertensive heart disease with heart failure: Secondary | ICD-10-CM

## 2023-11-11 DIAGNOSIS — E785 Hyperlipidemia, unspecified: Secondary | ICD-10-CM | POA: Diagnosis not present

## 2023-11-11 DIAGNOSIS — I5023 Acute on chronic systolic (congestive) heart failure: Secondary | ICD-10-CM | POA: Diagnosis not present

## 2023-11-11 DIAGNOSIS — E876 Hypokalemia: Secondary | ICD-10-CM

## 2023-11-11 DIAGNOSIS — I10 Essential (primary) hypertension: Secondary | ICD-10-CM | POA: Diagnosis not present

## 2023-11-11 HISTORY — PX: TRANSESOPHAGEAL ECHOCARDIOGRAM (CATH LAB): EP1270

## 2023-11-11 LAB — CBC
HCT: 38.3 % — ABNORMAL LOW (ref 39.0–52.0)
Hemoglobin: 12.6 g/dL — ABNORMAL LOW (ref 13.0–17.0)
MCH: 30.8 pg (ref 26.0–34.0)
MCHC: 32.9 g/dL (ref 30.0–36.0)
MCV: 93.6 fL (ref 80.0–100.0)
Platelets: 368 10*3/uL (ref 150–400)
RBC: 4.09 MIL/uL — ABNORMAL LOW (ref 4.22–5.81)
RDW: 13.1 % (ref 11.5–15.5)
WBC: 4.7 10*3/uL (ref 4.0–10.5)
nRBC: 0 % (ref 0.0–0.2)

## 2023-11-11 LAB — RENAL FUNCTION PANEL
Albumin: 3 g/dL — ABNORMAL LOW (ref 3.5–5.0)
Anion gap: 8 (ref 5–15)
BUN: 32 mg/dL — ABNORMAL HIGH (ref 8–23)
CO2: 24 mmol/L (ref 22–32)
Calcium: 8.8 mg/dL — ABNORMAL LOW (ref 8.9–10.3)
Chloride: 109 mmol/L (ref 98–111)
Creatinine, Ser: 1.3 mg/dL — ABNORMAL HIGH (ref 0.61–1.24)
GFR, Estimated: 60 mL/min — ABNORMAL LOW (ref >60)
Glucose, Bld: 98 mg/dL (ref 70–99)
Phosphorus: 4.5 mg/dL (ref 2.5–4.6)
Potassium: 4 mmol/L (ref 3.5–5.1)
Sodium: 141 mmol/L (ref 135–145)

## 2023-11-11 LAB — ECHO TEE

## 2023-11-11 SURGERY — TRANSESOPHAGEAL ECHOCARDIOGRAM (TEE) (CATHLAB)
Anesthesia: Monitor Anesthesia Care

## 2023-11-11 MED ORDER — LIDOCAINE 2% (20 MG/ML) 5 ML SYRINGE
INTRAMUSCULAR | Status: DC | PRN
  Administered 2023-11-11: 60 mg via INTRAVENOUS

## 2023-11-11 MED ORDER — PROPOFOL 500 MG/50ML IV EMUL
INTRAVENOUS | Status: DC | PRN
  Administered 2023-11-11: 180 ug/kg/min via INTRAVENOUS

## 2023-11-11 MED ORDER — PROPOFOL 10 MG/ML IV BOLUS
INTRAVENOUS | Status: DC | PRN
  Administered 2023-11-11: 50 mg via INTRAVENOUS
  Administered 2023-11-11: 30 mg via INTRAVENOUS
  Administered 2023-11-11: 50 mg via INTRAVENOUS

## 2023-11-11 MED ORDER — SODIUM CHLORIDE 0.9 % IV SOLN: INTRAVENOUS | Status: DC

## 2023-11-11 MED ORDER — METOPROLOL SUCCINATE ER 25 MG PO TB24
37.5000 mg | ORAL_TABLET | Freq: Every day | ORAL | 0 refills | Status: DC
  Filled 2023-11-11: qty 45, 30d supply, fill #0

## 2023-11-11 MED ORDER — GLYCOPYRROLATE PF 0.2 MG/ML IJ SOSY
PREFILLED_SYRINGE | INTRAMUSCULAR | Status: DC | PRN
  Administered 2023-11-11: .1 mg via INTRAVENOUS

## 2023-11-11 MED ORDER — FUROSEMIDE 20 MG PO TABS
20.0000 mg | ORAL_TABLET | Freq: Every day | ORAL | 0 refills | Status: DC
  Filled 2023-11-11: qty 30, 30d supply, fill #0

## 2023-11-11 MED ORDER — FUROSEMIDE 20 MG PO TABS: 20.0000 mg | ORAL_TABLET | Freq: Every day | ORAL | Status: DC

## 2023-11-11 NOTE — Anesthesia Postprocedure Evaluation (Signed)
 Anesthesia Post Note  Patient: [name]  Procedure(s) Performed: TRANSESOPHAGEAL ECHOCARDIOGRAM     Patient location during evaluation: Cath Lab Anesthesia Type: MAC Level of consciousness: oriented, patient cooperative and awake and alert Pain management: pain level controlled Vital Signs Assessment: post-procedure vital signs reviewed and stable Respiratory status: spontaneous breathing, nonlabored ventilation and respiratory function stable Cardiovascular status: blood pressure returned to baseline and stable Postop Assessment: no apparent nausea or vomiting Anesthetic complications: no   No notable events documented.  Last Vitals:  Vitals:   11/11/23 1100 11/11/23 1110  BP: 110/65 112/72  Pulse: 81 70  Resp: (!) 27 15  Temp: (!) 36.2 C   SpO2: 93% 97%    Last Pain:  Vitals:   11/11/23 1100  TempSrc: Temporal  PainSc:                  [name],[name]

## 2023-11-11 NOTE — Discharge Summary (Addendum)
 Physician Discharge Summary   Patient: [name] MRN: [id number] DOB: October 19, 1955  Admit date:     11/08/2023  Discharge date: 11/11/23  Discharge Physician: [name]   PCP: Melvin Dark, MD   Recommendations at discharge:   Metoprolol has been increased to 37.5 mg due to SVT. Added furosemide for diuresis, 20 mg daily, with instruction to take 40 mg daily in case of volume overload, weight gain 2 to 3 lbs in 24 hrs or 5 lbs in 7 days. Holding entresto due to acutely reduced GFR, plan to resume as outpatient if renal function and blood pressure stable. Follow up renal function and electrolytes in 7 days as outpatient Follow up with [name] in 7 to 10 days.  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 [facility] Course: [name] was admitted to the hospital with the working diagnosis of heart failure decompensation.    68 y.o. male with medical history significant of AML, anemia, depression, hiatal hernia, hypertension, dyslipidemia, MDS, coronary artery disease and heart failure who presented with chest pain.  Patient has been at his usual stat of health until last night when he woke up from his sleep with upper abdominal pain, 7/10 in intensity dull in nature, with no radiation, no improving or worsening symptoms. No associated diaphoresis or dyspnea with chest pain.  He noted dyspnea on exertion and heaviness in his legs for a few days prior.  Over the last week he noted rapid heart rates, per his smart watch report, (asymptomatic).  Because persistent symptoms he came to the hospital for further evaluation On his initial  physical examination his blood pressure was 141/103, HR 75, RR 19 and 02 saturation 95%. Cardiovascular with S1 and S2 present and regular with no gallops, rubs or murmurs. Respiratory with no wheezing, no rhonchi mild rales at bases.  Abdomen with no distention. No lower extremity edema.  Na 139, K 3,5 Cl 106 bicarbonate 22, glucose 111, bun 36 cr 1,31  AST 40 ALT 60  Lipase 27 BNP 2,996  High sensitive troponin 65 and 49  Wbc 6,0 hgb 11,2 plt 267    Chest radiograph with cardiomegaly with bilateral hilar vascular congestion and bilateral interstitial infiltrates.    EKG 82 bom, left axis deviation, right bundle branch block, qtc 498, sinus rhythm with no significant ST segment or T wave changes.   Patient was placed on furosemide for diuresis.  Echocardiogram with reduced LV systolic function and positive wall motion abnormalities. Positive mitral valve regurgitation.  Had episode of SVT during his hospitalization and metoprolol dose was increased.   04/01 cardiac catheterization with mild non obstructive coronary artery disease and pulmonary capillary wedge pressure of 15 mmHg. 04/02 TEE with moderate mitral regurgitation.  Plan to continue diuresis with furosemide and follow up with Cardiology as outpatient.     Assessment and Plan: * Acute on chronic systolic CHF (congestive heart failure) (HCC) Echocardiogram with reduced LV systolic function EF 30 to 35%, global hypokinesis, mild dilatated internal cavity, RV systolic function low normal, with RVSP 36,5 mmHg, LA with mild dilatation, moderate to severe mitral regurgitation, posterior leaflet tethering. Left  inferior wall and posterior wall are disproportionately severely hypokinetic.   Patient was placed on IV furosemide for diuresis, negative fluid balance was achieved, - 5,862 ml, with significant improvement in his symptoms.   Further work up with left and right heart catheterization.  04/01 mild non obstructive coronary  artery disease.  RA 5 RV 32/-2 PACWP 15 mmHg Cardiac output 5,13 and index 2,68 (Fick)   Plan to continue medical therapy with metoprolol and SGLT 2 inh  Hold on Entresot and further RAAS inhibition until follow up as outpatient, renal function and blood pressure.  Diuresis with furosemide 20 mg daily, increase to 40 mg in case of volume overload.   Troponin elevation due to heart failure exacerbation, ruled out for acute coronary syndrome.   Essential hypertension, benign Continue blood pressure control with metoprolol.   GERD with hiatal hernia Will continue antiacid therapy Liver US with negative for acute findings.   AML (acute myeloid leukemia) in remission [facility]) s/p BMT 2000 On remission, follow up as outpatient.   Dyslipidemia Continue with pravastatin   Hypokalemia Stage 2 CKD.  Corrected hypokalemia.  At the time of his discharge his serum cr is 1.30 with K at 4.0 and serum bicarbonate at 24  Na 141    Plan to continue diuresis with furosemide and follow up renal function and electrolytes as outpatient.    Consultants: Cardiology  Procedures performed: TEE   Disposition: Home Diet recommendation:  Cardiac diet DISCHARGE MEDICATION: Allergies as of 11/11/2023       Reactions   Morphine Other (See Comments)   UNKNOWN - REACTION IS NOT LISTED "Makes me crazy"   Marinol [dronabinol]    Delusions.        Medication List     STOP taking these medications    doxycycline 100 MG tablet Commonly known as: VIBRA-TABS   Entresto 97-103 MG Generic drug: sacubitril-valsartan   meloxicam 15 MG tablet Commonly known as: MOBIC       TAKE these medications    calcium-vitamin D 500-200 MG-UNIT Tabs tablet Commonly known as: OSCAL WITH D Take 1 tablet by mouth daily.   cetirizine 10 MG tablet Commonly known as: ZYRTEC Take 1 tablet (10 mg total) by mouth daily.   empagliflozin 10 MG Tabs tablet Commonly known as: Jardiance Take 1 tablet (10 mg  total) by mouth daily.   EpiPen 2-Pak 0.3 MG/0.3ML Soaj injection Generic drug: EPINEPHrine Inject 0.3 mg into the muscle as needed for anaphylaxis.   famotidine 20 MG tablet Commonly known as: PEPCID Take 20 mg by mouth 2 (two) times daily.   fluticasone 50 MCG/ACT nasal spray Commonly known as: FLONASE Place 1 spray into both nostrils daily.   furosemide 20 MG tablet Commonly known as: LASIX Take 1 tablet (20 mg total) by mouth daily. In case of weight gain 2 to 3 lbs in 24 hrs or 5 lbs in seven days, take 2 tablets until weight back to baseline. Start taking on: November 12, 2023   JUICE PLUS FIBRE PO Take by mouth. 2 tabs twice daily   metoprolol succinate 25 MG 24 hr tablet Commonly known as: TOPROL-XL Take 1.5 tablets (37.5 mg total) by mouth daily. Start taking on: November 12, 2023 What changed: how much to take   montelukast 10 MG tablet Commonly known as: SINGULAIR Take 10 mg by mouth daily.   omeprazole 20 MG capsule Commonly known as: PRILOSEC Take 20 mg by mouth 2 (two) times daily before a meal.  pravastatin 20 MG tablet Commonly known as: PRAVACHOL TAKE 1 TABLET BY MOUTH DAILY   promethazine-dextromethorphan 6.25-15 MG/5ML syrup Commonly known as: PROMETHAZINE-DM Take 5 mLs by mouth 3 (three) times daily as needed for cough.   sertraline 50 MG tablet Commonly known as: ZOLOFT TAKE 1 TABLET BY MOUTH DAILY   sildenafil 100 MG tablet Commonly known as: VIAGRA TAKE 1/2 TABLET BY MOUTH DAILY AS NEEDED FOR FOR ERECTILE DYSFUNCTION   triamcinolone ointment 0.1 % Commonly known as: KENALOG Apply 1 Application topically as needed.   valACYclovir 500 MG tablet Commonly known as: VALTREX Take 1 tablet (500 mg total) by mouth 2 (two) times daily. As needed for flareups   Vitamin B Complex Tabs Take 1 tablet by mouth daily.   Vitamin B12 1000 MCG Tbcr Take 1 tablet by mouth daily.   Vitamin C 500 MG Caps Take 500 mg by mouth daily.        Follow-up  Information     Perlie Gold, PA-C Follow up.   Specialty: Cardiology Why: Friday Nov 27, 2023 Arrive by 10:40 AM Appt at 10:55 AM (25 min) Contact information: [address] [place], [address] [place] [address] [phone]                Discharge Exam: Ceasar Mons Weights   11/08/23 0321 11/08/23 0654 11/09/23 2040  Weight: 76.7 kg 79.9 kg 73.5 kg   BP 119/88 (BP Location: Left Arm)   Pulse 68   Temp (!) 97.1 F (36.2 C) (Temporal)   Resp 18   Ht 5\' 10"  (1.778 m)   Wt 73.5 kg   SpO2 99%   BMI 23.25 kg/m   Neurology awake and alert ENT with no pallor or icterus Cardiovascular with S1 and S2 present and regular with no gallops or rubs, positive systolic murmur at the apex No JVD No lower extremity edema Respiratory with no rales or wheezing, no rhonchi Abdomen with no distention   Condition at discharge: stable  The results of significant diagnostics from this hospitalization (including imaging, microbiology, ancillary and laboratory) are listed below for reference.   Imaging Studies: EP STUDY Result Date: 11/11/2023 See surgical note for result.  CARDIAC CATHETERIZATION Result Date: 11/10/2023   Prox RCA lesion is 20% stenosed.   Mid RCA to Dist RCA lesion is 20% stenosed.   Ramus lesion is 20% stenosed.   2nd Diag lesion is 50% stenosed.   Mid LAD lesion is 20% stenosed. Mild non-obstructive CAD Normal right heart pressures PCWP V wave 20 mm Hg, mean 15 mmHg LVEDP 20 mmHg Recommendations: No obstructive CAD. His mitral regurgitation is not ischemia mediated. Continue planning for possible mitral valve surgery with TEE tomorrow.   ECHOCARDIOGRAM COMPLETE Result Date: 11/08/2023    ECHOCARDIOGRAM REPORT   Patient Name:   [name] Date of Exam: 11/08/2023 Medical Rec #:  [id number]       Height:       70.0 in Accession #:    [id number]      Weight:       176.2 lb Date of Birth:  [birth year]      BSA:          1.978 m Patient Age:    67 years        BP:            147/102 mmHg Patient Gender: M               HR:  88 bpm. Exam Location:  Inpatient Procedure: 2D Echo, Cardiac Doppler and Color Doppler (Both Spectral and Color            Flow Doppler were utilized during procedure). Indications:    Dyspnea R06.00  History:        Patient has prior history of Echocardiogram examinations, most                 recent 03/18/2012. CHF; Risk Factors:Hypertension and                 Dyslipidemia.  Sonographer:    Lucendia Herrlich RCS Referring Phys: [id number] Staysha Truby DANIEL Yarixa [employer]cap IMPRESSIONS  1. While there is global left ventricular hypokinesis, the inferior and inferolateral walls have disproportionately severe hypokinesis, suggesting right coronary or left circumflex artery ischemia. There is posterior mitral leaflet tethering. Left ventricular ejection fraction, by estimation, is 30 to 35%. The left ventricle has moderately decreased function. The left ventricle demonstrates global hypokinesis. The left ventricular internal cavity size was mildly dilated. Left ventricular diastolic  parameters are consistent with Grade II diastolic dysfunction (pseudonormalization). Elevated left atrial pressure.  2. Right ventricular systolic function is low normal. The right ventricular size is normal. There is mildly elevated pulmonary artery systolic pressure. The estimated right ventricular systolic pressure is 36.5 mmHg.  3. Left atrial size was mildly dilated.  4. There is posterior mitral leaflet tethering. The regurgitant jet originates primarily at the A2-P2 commisureBy the PISA method, the effective regurgitant orifice area is 0.27 cm sq, regurgitant volume 35 ml, regurgitant fraction 45%. MR vena contracta is 5 mm. The mitral valve area by planimetry is 6.25 cm sq. The mitral valve is abnormal. Moderate to severe mitral valve regurgitation. The mean mitral valve gradient is 2.0 mmHg with average heart rate of 82 bpm.  5. The aortic valve is tricuspid. Aortic valve regurgitation  is trivial.  6. The inferior vena cava is dilated in size with >50% respiratory variability, suggesting right atrial pressure of 8 mmHg. Comparison(s): A prior study was performed on 2013. Prior images unable to be directly viewed, comparison made by report only. Mitral insufficiency appears to be substantially worse and LVEF is lower. Overall impression suggests coronary insufficiency superimposed on preexisting nonischemiccardiomyopathy. Conclusion(s)/Recommendation(s): Consider TEE for MR evaluation. Consider repeat ischemic evaluation. FINDINGS  Left Ventricle: While there is global left ventricular hypokinesis, the inferior and inferolateral walls have disproportionately severe hypokinesis, suggesting right coronary or left circumflex artery ischemia. There is posterior mitral leaflet tethering. Left ventricular ejection fraction, by estimation, is 30 to 35%. The left ventricle has moderately decreased function. The left ventricle demonstrates global hypokinesis. The left ventricular internal cavity size was mildly dilated. There is no left ventricular hypertrophy. Left ventricular diastolic parameters are consistent with Grade II diastolic dysfunction (pseudonormalization). Elevated left atrial pressure.  LV Wall Scoring: The inferior wall and posterior wall are disproportionately severely hypokinetic, but all wall segments are hypokinetic. Right Ventricle: The right ventricular size is normal. No increase in right ventricular wall thickness. Right ventricular systolic function is low normal. There is mildly elevated pulmonary artery systolic pressure. The tricuspid regurgitant velocity is 2.67 m/s, and with an assumed right atrial pressure of 8 mmHg, the estimated right ventricular systolic pressure is 36.5 mmHg. Left Atrium: Left atrial size was mildly dilated. Right Atrium: Right atrial size was normal in size. Pericardium: There is no evidence of pericardial effusion. Mitral Valve: There is posterior  mitral leaflet tethering. The regurgitant jet originates primarily at the  A2-P2 commisureBy the PISA method, the effective regurgitant orifice area is 0.27 cm sq, regurgitant volume 35 ml, regurgitant fraction 45%. MR vena contracta is 5 mm. The mitral valve area by planimetry is 6.25 cm sq. The mitral valve is abnormal. Moderate to severe mitral valve regurgitation, with eccentric posteriorly directed jet. The mean mitral valve gradient is 2.0 mmHg with average heart  rate of 82 bpm. Tricuspid Valve: The tricuspid valve is normal in structure. Tricuspid valve regurgitation is mild. Aortic Valve: The aortic valve is tricuspid. Aortic valve regurgitation is trivial. Aortic valve peak gradient measures 3.8 mmHg. Pulmonic Valve: The pulmonic valve was grossly normal. Pulmonic valve regurgitation is not visualized. No evidence of pulmonic stenosis. Aorta: The aortic root and ascending aorta are structurally normal, with no evidence of dilitation. Venous: A pattern of systolic flow reversal, suggestive of severe mitral regurgitation is recorded from the right upper pulmonary vein. The inferior vena cava is dilated in size with greater than 50% respiratory variability, suggesting right atrial pressure of 8 mmHg. IAS/Shunts: No atrial level shunt detected by color flow Doppler.  LEFT VENTRICLE PLAX 2D LVIDd:         5.60 cm      Diastology LVIDs:         4.47 cm      LV e' medial:    6.09 cm/s LV PW:         1.00 cm      LV E/e' medial:  13.4 LV IVS:        1.10 cm      LV e' lateral:   11.50 cm/s LVOT diam:     2.30 cm      LV E/e' lateral: 7.1 LV SV:         41 LV SV Index:   21 LVOT Area:     4.15 cm  LV Volumes (MOD) LV vol d, MOD A2C: 154.0 ml LV vol d, MOD A4C: 127.0 ml LV vol s, MOD A2C: 102.0 ml LV vol s, MOD A4C: 85.4 ml LV SV MOD A2C:     52.0 ml LV SV MOD A4C:     127.0 ml LV SV MOD BP:      49.5 ml RIGHT VENTRICLE             IVC RV S prime:     11.20 cm/s  IVC diam: 2.60 cm TAPSE (M-mode): 1.4 cm LEFT ATRIUM              Index        RIGHT ATRIUM           Index LA diam:        4.10 cm 2.07 cm/m   RA Area:     11.50 cm LA Vol (A2C):   43.5 ml 21.99 ml/m  RA Volume:   24.30 ml  12.29 ml/m LA Vol (A4C):   47.1 ml 23.81 ml/m LA Biplane Vol: 45.5 ml 23.00 ml/m  AORTIC VALVE AV Area (Vmax): 2.75 cm AV Vmax:        97.50 cm/s AV Peak Grad:   3.8 mmHg LVOT Vmax:      64.60 cm/s LVOT Vmean:     40.900 cm/s LVOT VTI:       0.099 m  AORTA Ao Root diam: 3.30 cm Ao Asc diam:  3.20 cm MITRAL VALVE                  TRICUSPID VALVE MV Area (PHT):  4.49 cm       TR Peak grad:   28.5 mmHg MV Mean grad:  2.0 mmHg       TR Vmax:        267.00 cm/s MV Decel Time: 169 msec MR Peak grad:    81.8 mmHg    SHUNTS MR Mean grad:    55.0 mmHg    Systemic VTI:  0.10 m MR Vmax:         452.33 cm/s  Systemic Diam: 2.30 cm MR Vmean:        353.0 cm/s MR PISA:         3.82 cm MR PISA Eff ROA: 27 mm MR PISA Radius:  0.78 cm MV E velocity: 81.80 cm/s MV A velocity: 39.50 cm/s MV E/A ratio:  2.07 Mihai Croitoru MD Electronically signed by Thurmon Fair MD Signature Date/Time: 11/08/2023/3:42:13 PM    Final    US Abdomen Limited RUQ (LIVER/GB) Result Date: 11/08/2023 CLINICAL DATA:  [id number] Epigastric abdominal pain [id number] EXAM: ULTRASOUND ABDOMEN LIMITED RIGHT UPPER QUADRANT COMPARISON:  Same day CT FINDINGS: Gallbladder: No gallstones or wall thickening visualized. No pericholecystic fluid is evident. No sonographic Murphy sign noted by sonographer. Common bile duct: Diameter: 6.5 mm. Liver: No focal lesion identified. Within normal limits in parenchymal echogenicity. Portal vein is patent on color Doppler imaging with normal direction of blood flow towards the liver. Other: None. IMPRESSION: Unremarkable right upper quadrant ultrasound. Electronically Signed   By: Duanne Guess D.O.   On: 11/08/2023 09:35   CT ABDOMEN PELVIS W CONTRAST Result Date: 11/08/2023 CLINICAL DATA:  Acute, nonlocalized abdominal pain EXAM: CT ABDOMEN AND PELVIS  WITH CONTRAST TECHNIQUE: Multidetector CT imaging of the abdomen and pelvis was performed using the standard protocol following bolus administration of intravenous contrast. RADIATION DOSE REDUCTION: This exam was performed according to the departmental dose-optimization program which includes automated exposure control, adjustment of the mA and/or kV according to patient size and/or use of iterative reconstruction technique. CONTRAST:  OMNIPAQUE IOHEXOL 300 MG/ML  SOLN COMPARISON:  12/16/2006 FINDINGS: Lower chest: Small layering pleural effusions with adjacent atelectasis or scarring. Cluster of nodular airspace densities at the right lung base, minimally covered. Hepatobiliary: No focal liver abnormality.Fat stranding surrounds the gallbladder with only moderate distension and no calcified stone. No common bile duct dilatation Pancreas: Unremarkable. Spleen: Unremarkable. Adrenals/Urinary Tract: Negative adrenals. No hydronephrosis or stone. Unremarkable bladder. Stomach/Bowel: Some fat inflammation is seen in the vicinity of the duodenum but no submucosal edema/thickening. Colonic diverticulosis is mild and without focal inflammation. No appendicitis. Vascular/Lymphatic: Atheromatous calcification of the aorta and iliacs. Distended appearance of the common iliac and IVC with generalized retroperitoneal edema greater on the right. No mass or adenopathy. Reproductive:No pathologic findings. Other: No ascites or pneumoperitoneum. Small fatty right inguinal hernia. Musculoskeletal: Lumbar spine degeneration especially affecting facets and L5-S1 disc space. IMPRESSION: 1. Pericholecystic edema but no calcified stone or gallbladder distension, correlate for biliary colic symptoms. 2. Partially covered airspace disease at the right lung base suggesting pneumonia or aspiration. 3. Retroperitoneal edema is generalized and nonspecific, there is also small pleural effusions, question volume overload-which could also  explain the pericholecystic findings. Electronically Signed   By: Tiburcio Pea M.D.   On: 11/08/2023 04:19   DG Chest Port 1 View Result Date: 11/08/2023 CLINICAL DATA:  Shortness of breath and upper abdominal pain. EXAM: PORTABLE CHEST 1 VIEW COMPARISON:  12/14/2012 FINDINGS: Stable cardiomediastinal silhouette at the upper limits of normal in size. Pulmonary  vascular congestion and mild interstitial coarsening. No focal consolidation, pleural effusion, or pneumothorax. IMPRESSION: Pulmonary vascular congestion and mild interstitial coarsening which may be due to edema or atypical infection. Electronically Signed   By: Minerva Fester M.D.   On: 11/08/2023 04:01   LONG TERM MONITOR (3-14 DAYS) Result Date: 11/05/2023 Patch Wear Time:  6 days and 5 hours (2025-03-13T14:53:54-0400 to 2025-03-19T20:03:08-398) Patient had a min HR of 62 bpm, max HR of 169 bpm, and avg HR of 82 bpm. Predominant underlying rhythm was Sinus Rhythm. Bundle Branch Block/IVCD was present. 1 run of Supraventricular Tachycardia occurred lasting 6 beats with a max rate of 169 bpm (avg 151 bpm). Isolated SVEs were rare (<1.0%), and no SVE Couplets or SVE Triplets were present. Isolated VEs were rare (<1.0%), and no VE Couplets or VE Triplets were present. Charlton Haws MD [facility]    Microbiology: Results for orders placed or performed in visit on 05/16/21  COVID-19, Flu A+B and RSV     Status: None   Collection Time: 05/16/21  2:33 PM   Specimen: Nasal Swab   Nasal Swab  Previously  Result Value Ref Range Status   SARS-CoV-2, NAA Not Detected Not Detected Final   Influenza A, NAA Not Detected Not Detected Final   Influenza B, NAA Not Detected Not Detected Final   RSV, NAA Not Detected Not Detected Final   Test Information: Comment  Final    Comment: This nucleic acid amplification test was developed and its performance characteristics determined by [employer]. Nucleic acid amplification tests include RT-PCR and  TMA. This test has not been FDA cleared or approved. This test has been authorized by FDA under an Emergency Use Authorization (EUA). This test is only authorized for the duration of time the declaration that circumstances exist justifying the authorization of the emergency use of in vitro diagnostic tests for detection of SARS-CoV-2 virus and/or diagnosis of COVID-19 infection under section 564(b)(1) of the Act, 21 U.S.C. 409WJX-9(J) (1), unless the authorization is terminated or revoked sooner. When diagnostic testing is negative, the possibility of a false negative result should be considered in the context of a patient's recent exposures and the presence of clinical signs and symptoms consistent with COVID-19. An individual without symptoms of COVID-19 and who is not shedding SARS-CoV-2 virus wo uld expect to have a negative (not detected) result in this assay.     Labs: CBC: Recent Labs  Lab 11/08/23 0328 11/08/23 1035 11/08/23 1757 11/09/23 0617 11/10/23 1554 11/10/23 1557 11/11/23 0442  WBC 6.0 5.0  --  5.9  --   --  4.7  HGB 11.2* 10.6* 12.9* 11.4* 12.6* 12.9* 12.6*  HCT 34.2* 32.1* 39.0 34.0* 37.0* 38.0* 38.3*  MCV 94.5 95.0  --  92.9  --   --  93.6  PLT 267 268  --  303  --   --  368   Basic Metabolic Panel: Recent Labs  Lab 11/08/23 0328 11/08/23 1035 11/09/23 0617 11/10/23 0550 11/10/23 1554 11/10/23 1557 11/11/23 0442  NA 139  --  140 141 144 141 141  K 3.5  --  3.6 3.5 3.7 3.9 4.0  CL 106  --  106 103  --   --  109  CO2 22  --  22 23  --   --  24  GLUCOSE 111*  --  107* 119*  --   --  98  BUN 36*  --  27* 32*  --   --  32*  CREATININE 1.31* 1.31* 1.47* 1.57*  --   --  1.30*  CALCIUM 8.7*  --  8.6* 8.9  --   --  8.8*  MG  --   --  1.9 1.9  --   --   --   PHOS  --   --   --  4.4  --   --  4.5   Liver Function Tests: Recent Labs  Lab 11/08/23 0328 11/10/23 0550 11/11/23 0442  AST 40  --   --   ALT 60*  --   --   ALKPHOS 67  --   --   BILITOT  0.4  --   --   PROT 6.6  --   --   ALBUMIN 3.9 3.2* 3.0*   CBG: No results for input(s): "GLUCAP" in the last 168 hours.  Discharge time spent: greater than 30 minutes.  Signed: Coralie Keens, MD [facility] 11/11/2023

## 2023-11-11 NOTE — Progress Notes (Signed)
 Heart Failure Navigator Progress Note  Assessed for Heart & Vascular TOC clinic readiness.  Patient has a scheduled [facility] appointment on 11/27/2023. No HF TOC. .   Navigator will sign off at this time.   Rhae Hammock, BSN, Scientist, clinical (histocompatibility and immunogenetics) Only

## 2023-11-11 NOTE — Hospital Course (Signed)
 [name] was admitted to the hospital with the working diagnosis of heart failure decompensation.    68 y.o. male with medical history significant of AML, anemia, depression, hiatal hernia, hypertension, dyslipidemia, MDS, coronary artery disease and heart failure who presented with chest pain.  Patient has been at his usual stat of health until last night when he woke up from his sleep with upper abdominal pain, 7/10 in intensity dull in nature, with no radiation, no improving or worsening symptoms. No associated diaphoresis or dyspnea with chest pain.  He noted dyspnea on exertion and heaviness in his legs for a few days prior.  Over the last week he noted rapid heart rates, per his smart watch report, (asymptomatic).  Because persistent symptoms he came to the hospital for further evaluation On his initial physical examination his blood pressure was 141/103, HR 75, RR 19 and 02 saturation 95%. Cardiovascular with S1 and S2 present and regular with no gallops, rubs or murmurs. Respiratory with no wheezing, no rhonchi mild rales at bases.  Abdomen with no distention. No lower extremity edema.  Na 139, K 3,5 Cl 106 bicarbonate 22, glucose 111, bun 36 cr 1,31  AST 40 ALT 60  Lipase 27 BNP 2,996  High sensitive troponin 65 and 49  Wbc 6,0 hgb 11,2 plt 267    Chest radiograph with cardiomegaly with bilateral hilar vascular congestion and bilateral interstitial infiltrates.    EKG 82 bom, left axis deviation, right bundle branch block, qtc 498, sinus rhythm with no significant ST segment or T wave changes.   Patient was placed on furosemide for diuresis.  Echocardiogram with reduced LV systolic function and positive wall motion abnormalities. Positive mitral valve regurgitation.  Had episode of SVT during his hospitalization and metoprolol dose was increased.   04/01 cardiac catheterization with mild non obstructive coronary artery disease and pulmonary capillary wedge pressure of 15  mmHg. 04/02 TEE with moderate mitral regurgitation.  Plan to continue diuresis with furosemide and follow up with Cardiology as outpatient.

## 2023-11-11 NOTE — Plan of Care (Signed)
   Problem: Education: Goal: Knowledge of General Education information will improve Description Including pain rating scale, medication(s)/side effects and non-pharmacologic comfort measures Outcome: Progressing   Problem: Health Behavior/Discharge Planning: Goal: Ability to manage health-related needs will improve Outcome: Progressing

## 2023-11-11 NOTE — Transfer of Care (Signed)
 Immediate Anesthesia Transfer of Care Note  Patient: [name]  Procedure(s) Performed: TRANSESOPHAGEAL ECHOCARDIOGRAM  Patient Location: Cath Lab  Anesthesia Type:MAC  Level of Consciousness: drowsy and responds to stimulation  Airway & Oxygen Therapy: Patient Spontanous Breathing and Patient connected to nasal cannula oxygen  Post-op Assessment: Report given to RN and Post -op Vital signs reviewed and stable  Post vital signs: Reviewed and stable  Last Vitals:  Vitals Value Taken Time  BP 106/61 (76)   Temp    Pulse 81 11/11/23 1059  Resp 27 11/11/23 1059  SpO2 93 % 11/11/23 1059  Vitals shown include unfiled device data.  Last Pain:  Vitals:   11/11/23 0841  TempSrc: Temporal  PainSc:          Complications: No notable events documented.

## 2023-11-11 NOTE — CV Procedure (Signed)
 TRANSESOPHAGEAL ECHOCARDIOGRAM (TEE) NOTE  INDICATIONS: Mitral regurgitation  PROCEDURE:   Informed consent was obtained prior to the procedure. The risks, benefits and alternatives for the procedure were discussed and the patient comprehended these risks.  Risks include, but are not limited to, cough, sore throat, vomiting, nausea, somnolence, esophageal and stomach trauma or perforation, bleeding, low blood pressure, aspiration, pneumonia, infection, trauma to the teeth and death.    After a procedural time-out, the patient was given propofol for sedation by anesthesia. See their separate report.  The patient's heart rate, blood pressure, and oxygen saturation are monitored continuously during the procedure.The oropharynx was anesthetized with topical cetacaine.  The transesophageal probe was inserted in the esophagus and stomach without difficulty and multiple views were obtained.  The patient was kept under observation until the patient left the procedure room.  I was present face-to-face 100% of this time. The patient left the procedure room in stable condition.   Agitated microbubble saline contrast was not administered.  COMPLICATIONS:    There were no immediate complications.  Findings:  LEFT VENTRICLE: The left ventricular wall thickness is normal.  The left ventricular cavity is mildly dilated in size. Wall motion is globally hypokinetic with severe basal to mid inferior hypokinesis.  LVEF is 30-35%.  RIGHT VENTRICLE:  The right ventricle is normal in structure and function without any thrombus or masses.    LEFT ATRIUM:  The left atrium is mildly dilated in size without any thrombus or masses.  There is not spontaneous echo contrast ("smoke") in the left atrium consistent with a low flow state.  LEFT ATRIAL APPENDAGE:  The left atrial appendage is free of any thrombus or masses. The appendage has single lobes. Pulse doppler indicates moderate flow in the appendage.  ATRIAL  SEPTUM:  The atrial septum appears intact and is free of thrombus and/or masses.  There is no evidence for interatrial shunting by color doppler.  RIGHT ATRIUM:  The right atrium is normal in size and function without any thrombus or masses.  MITRAL VALVE:  The mitral valve is normal in structure but exhibits tethering of the P2 segment of the posterior leaflet. Visualized with 2D/3D imaging. The annulus is mildly dilated at 4.12 cm. There is Moderate regurgitation with multiple jets. ERO 0.20 cm, RVol 33 mL, MR radius 0.7 cm. There appears to be tethering of the posteromedial papillary muscle. No vegetations or stenosis.  AORTIC VALVE:  The aortic valve is trileaflet, normal in structure and function with  trivial central  regurgitation.  There were no vegetations or stenosis  TRICUSPID VALVE:  The tricuspid valve is normal in structure and function with  no  regurgitation.  There were no vegetations or stenosis   PULMONIC VALVE:  The pulmonic valve is normal in structure and function with  no  regurgitation.  There were no vegetations or stenosis.   AORTIC ARCH, ASCENDING AND DESCENDING AORTA:  There was no Myrtis Ser et. Al, 1992) atherosclerosis of the ascending aorta, aortic arch, or proximal descending aorta.  12. PULMONARY VEINS: Anomalous pulmonary venous return was not noted.  13. PERICARDIUM: The pericardium appeared normal and non-thickened.  There is no pericardial effusion.  IMPRESSION:   Moderate mitral regurgitation with tethering of the P2 leaflet and multiple posteriorly directed regurgitant jets. There is hypokinesis of the posteromedial papillary muscle and the basal to mid inferior/inferolateral walls are severely hypokinetic. The mitral annulus is mildly dilated at 4.12 cm Trivial AI Mild LAE LVEF 30-35% with global  hypokinesis and basal to mid-inferior/inferolateral severe hypokinesis.  RECOMMENDATIONS:    Consider cMRI to further elucidate mechanism of posterior tethering  as it appears non-ischemic by recent cath. ?scar related to prior AML chemotherapy.  Overall the valve itself appears normal, suggesting the degree of MR may improve if the LVEF improves with GDMT.  Time Spent Directly with the Patient:  45 minutes   Chrystie Nose, MD, [facility], FACP  [place]  [facility]  Medical Director of the [facility]  Cardiovascular Risk Reduction Clinic Diplomate of the American Board of Clinical Lipidology Attending Cardiologist  Direct Dial: [phone]  Fax: [phone]  Website:  [email or web address] Nicely Peregrine Nolt 11/11/2023, 10:51 AM

## 2023-11-11 NOTE — Progress Notes (Signed)
  Echocardiogram Echocardiogram Transesophageal has been performed.  [name] 11/11/2023, 11:07 AM

## 2023-11-11 NOTE — Interval H&P Note (Signed)
 History and Physical Interval Note:  11/11/2023 9:20 AM  [name]  has presented today for surgery, with the diagnosis of severe MR.  The various methods of treatment have been discussed with the patient and family. After consideration of risks, benefits and other options for treatment, the patient has consented to  Procedure(s): TRANSESOPHAGEAL ECHOCARDIOGRAM (N/A) as a surgical intervention.  The patient's history has been reviewed, patient examined, no change in status, stable for surgery.  I have reviewed the patient's chart and labs.  Questions were answered to the patient's satisfaction.     Chrystie Nose

## 2023-11-11 NOTE — Anesthesia Preprocedure Evaluation (Addendum)
 Anesthesia Evaluation  Patient identified by MRN, date of birth, ID band Patient awake    Reviewed: Allergy & Precautions, NPO status , Patient's Chart, lab work & pertinent test results, reviewed documented beta blocker date and time   History of Anesthesia Complications Negative for: history of anesthetic complications  Airway Mallampati: II  TM Distance: >3 FB Neck ROM: Full    Dental  (+) Dental Advisory Given   Pulmonary neg pulmonary ROS   breath sounds clear to auscultation       Cardiovascular hypertension, Pt. on medications and Pt. on home beta blockers (-) angina +CHF (entresto)  + Valvular Problems/Murmurs MR  Rhythm:Regular Rate:Normal + Systolic murmurs 4/0/9811 cath: Mild non-obstructive CAD Normal right heart pressures  10/2023 ECHO:  1. While there is global left ventricular hypokinesis, the inferior and  inferolateral walls have disproportionately severe hypokinesis, suggesting  right coronary or left circumflex artery ischemia. There is posterior  mitral leaflet tethering.  EF 30 to 35%. The LV has moderately decreased function, global hypokinesis. The left ventricular internal cavity size was mildly dilated. Grade II diastolic dysfunction  (pseudonormalization). Elevated left atrial pressure.   2. RVF is low normal. The right ventricular size is normal. There is mildly elevated pulmonary artery systolic pressure. The estimated right ventricular systolic pressure is 36.5 mmHg.   3. Left atrial size was mildly dilated.   4. There is posterior mitral leaflet tethering. The regurgitant jet originates primarily at the A2-P2 commisure. By the PISA method, the effective regurgitant orifice area is 0.27 cm sq, regurgitant volume 35 ml, regurgitant fraction 45%. MR vena  contracta is 5 mm. The mitral valve area by planimetry is 6.25 cm sq. The  mitral valve is abnormal. Moderate to severe mitral valve regurgitation.  The  mean mitral valve gradient is 2.0 mmHg with average heart rate of 82  bpm.   5. The aortic valve is tricuspid. Aortic valve regurgitation is trivial.     Neuro/Psych   Anxiety Depression    negative neurological ROS     GI/Hepatic Neg liver ROS,GERD  Medicated and Controlled,,  Endo/Other  negative endocrine ROS    Renal/GU Renal InsufficiencyRenal disease     Musculoskeletal  (+) Arthritis ,    Abdominal   Peds  Hematology AML   Anesthesia Other Findings   Reproductive/Obstetrics                             Anesthesia Physical Anesthesia Plan  ASA: 3  Anesthesia Plan: MAC   Post-op Pain Management: Minimal or no pain anticipated   Induction:   PONV Risk Score and Plan: 1 and Treatment may vary due to age or medical condition  Airway Management Planned: Nasal Cannula and Natural Airway  Additional Equipment: None  Intra-op Plan:   Post-operative Plan:   Informed Consent: I have reviewed the patients History and Physical, chart, labs and discussed the procedure including the risks, benefits and alternatives for the proposed anesthesia with the patient or authorized representative who has indicated his/her understanding and acceptance.     Dental advisory given  Plan Discussed with: CRNA and Surgeon  Anesthesia Plan Comments:        Anesthesia Quick Evaluation

## 2023-11-11 NOTE — Progress Notes (Addendum)
 Patient Name: [name] Date of Encounter: 11/11/2023 [facility] Cardiologist: Charlton Haws, MD   Interval Summary  .    No SVT overnight.  Asymptomatic- feels back to baseline.  Vital Signs .    Vitals:   11/10/23 1701 11/10/23 1754 11/10/23 2020 11/11/23 0410  BP: 129/88 124/81 115/83 113/75  Pulse:   83 74  Resp:   19 16  Temp:   98.3 F (36.8 C) 98 F (36.7 C)  TempSrc:   Oral Oral  SpO2:   96% 96%  Weight:      Height:        Intake/Output Summary (Last 24 hours) at 11/11/2023 0811 Last data filed at 11/10/2023 2014 Gross per 24 hour  Intake 277.38 ml  Output 700 ml  Net -422.62 ml      11/09/2023    8:40 PM 11/08/2023    6:54 AM 11/08/2023    3:21 AM  Last 3 Weights  Weight (lbs) 162 lb 0.6 oz 176 lb 3.2 oz 169 lb  Weight (kg) 73.5 kg 79.924 kg 76.658 kg      Telemetry/ECG    SR-  Personally Reviewed  CV Studies    Echocardiogram 11/08/2023 1. While there is global left ventricular hypokinesis, the inferior and  inferolateral walls have disproportionately severe hypokinesis, suggesting  right coronary or left circumflex artery ischemia. There is posterior  mitral leaflet tethering. Left  ventricular ejection fraction, by estimation, is 30 to 35%. The left  ventricle has moderately decreased function. The left ventricle  demonstrates global hypokinesis. The left ventricular internal cavity size  was mildly dilated. Left ventricular diastolic   parameters are consistent with Grade II diastolic dysfunction  (pseudonormalization). Elevated left atrial pressure.   2. Right ventricular systolic function is low normal. The right  ventricular size is normal. There is mildly elevated pulmonary artery  systolic pressure. The estimated right ventricular systolic pressure is  36.5 mmHg.   3. Left atrial size was mildly dilated.   4. There is posterior mitral leaflet tethering. The regurgitant jet  originates primarily at the A2-P2 commisureBy the  PISA method, the  effective regurgitant orifice area is 0.27 cm sq, regurgitant volume 35  ml, regurgitant fraction 45%. MR vena  contracta is 5 mm. The mitral valve area by planimetry is 6.25 cm sq. The  mitral valve is abnormal. Moderate to severe mitral valve regurgitation.  The mean mitral valve gradient is 2.0 mmHg with average heart rate of 82  bpm.   5. The aortic valve is tricuspid. Aortic valve regurgitation is trivial.   6. The inferior vena cava is dilated in size with >50% respiratory  variability, suggesting right atrial pressure of 8 mmHg.   Comparison(s): A prior study was performed on 2013. Prior images unable to  be directly viewed, comparison made by report only. Mitral insufficiency  appears to be substantially worse and LVEF is lower. Overall impression  suggests coronary insufficiency  superimposed on preexisting nonischemiccardiomyopathy.    cMRI 10/07/2021 1. Normal LV size and wall thickness, diffuse hypokinesis with EF 36%.   2.  Normal RV size with moderate hypokinesis, EF 35%.   3. Non-coronary somewhat subtle LGE pattern in the basal septum. Mid-wall LGE most suggestive of prior myocarditis. Elevated extracellular volume percentage in the basal septum suggestive of fibrosis. Basal septal T2 not elevated, doubt active inflammatio  Physical Exam .   GEN: No acute distress.   Neck: No JVD Cardiac: RRR, 2/6 murmur Respiratory:  Clear to auscultation bilaterally. GI: Soft, nontender, non-distended  MS: No edema  Patient Profile    [name] is a 68 y.o. male has hx of  chronic HFrEF, NICM, nonobstructive CAD based off CT 2022, bifascicular block, hypertension, AML in remission status post BMT 2000, MDS, hyperlipidemia.  Admitted for CHF found to have new moderate to severe MR.  Assessment & Plan .     Chronic HFrEF Nonischemic cardiomyopathy - euvolemic, PWCP elevated MR related - SGLT2i tonight, continue BB, holding ARNI, lasix 20 mg PO  daily at DC; needs follow up with Dr. Fabio Bering team   New moderate-severe MR - posterior restriction; TEE today; discussed minimally invasive MV repair  - After careful review of history and examination, the risks and benefits of transesophageal echocardiogram have been explained including risks of esophageal damage, perforation (1:10,000 risk), bleeding, pharyngeal hematoma as well as other potential complications associated with conscious sedation including aspiration, arrhythmia, respiratory failure and death. Alternatives to treatment were discussed, questions were answered. Patient is willing to proceed.  - afterwards needs TCTS consult   Supraventricular tachycardia - AVNRT suspected; improved with increased BB  Nonobstructive CAD Hyperlipidemia - no change confirmed on LHC, continue home therapy   Bifascicular block - no evidence of CHB on increased BB  AML in remission Echo not suggestive of AL-CA  If mod-severe MR is confirmed, inpatient consult for surgical timing with PW or close outpatient consultation (minimally invasive mitral repair).  Suspect DC today post TEE  For questions or updates, please contact [facility] Please consult [email or web address] for contact info under       Riley Lam, MD FASE [facility] Cardiologist [facility]  [address] [place], [address] [place], [place] [address] [phone]  8:11 AM  Technically difficult imaging. Moderate MR Decreased LVEF. Reviewed with patient and wife. Will work on outpatient GDMT with primary cardiologist (reviewed images with Dr. Eden Emms) restart SGL2i and lasix standing.  Gave precautions.  At outpatient f/u restart ARNI. Needs next available f/u with Dr. Ricki Miller' team. No further questions.  Reviewed with inpatient team.  Riley Lam, MD FASE [facility] Cardiologist [facility]  [address] [place], [address] [place], [place] [address] [phone]  11:59 AM

## 2023-11-11 NOTE — Plan of Care (Signed)
 Pt is discharged to home.   Problem: Education: Goal: Knowledge of General Education information will improve Description: Including pain rating scale, medication(s)/side effects and non-pharmacologic comfort measures Outcome: Completed/Met   Problem: Health Behavior/Discharge Planning: Goal: Ability to manage health-related needs will improve Outcome: Completed/Met   Problem: Clinical Measurements: Goal: Ability to maintain clinical measurements within normal limits will improve Outcome: Completed/Met Goal: Will remain free from infection Outcome: Completed/Met Goal: Diagnostic test results will improve Outcome: Completed/Met Goal: Respiratory complications will improve Outcome: Completed/Met Goal: Cardiovascular complication will be avoided Outcome: Completed/Met   Problem: Activity: Goal: Risk for activity intolerance will decrease Outcome: Completed/Met   Problem: Nutrition: Goal: Adequate nutrition will be maintained Outcome: Completed/Met   Problem: Coping: Goal: Level of anxiety will decrease Outcome: Completed/Met   Problem: Elimination: Goal: Will not experience complications related to bowel motility Outcome: Completed/Met Goal: Will not experience complications related to urinary retention Outcome: Completed/Met   Problem: Pain Managment: Goal: General experience of comfort will improve and/or be controlled Outcome: Completed/Met   Problem: Safety: Goal: Ability to remain free from injury will improve Outcome: Completed/Met   Problem: Skin Integrity: Goal: Risk for impaired skin integrity will decrease Outcome: Completed/Met   Problem: Education: Goal: Understanding of CV disease, CV risk reduction, and recovery process will improve Outcome: Completed/Met Goal: Individualized Educational Video(s) Outcome: Completed/Met   Problem: Activity: Goal: Ability to return to baseline activity level will improve Outcome: Completed/Met   Problem:  Cardiovascular: Goal: Ability to achieve and maintain adequate cardiovascular perfusion will improve Outcome: Completed/Met Goal: Vascular access site(s) Level 0-1 will be maintained Outcome: Completed/Met   Problem: Health Behavior/Discharge Planning: Goal: Ability to safely manage health-related needs after discharge will improve Outcome: Completed/Met

## 2023-11-27 ENCOUNTER — Ambulatory Visit: Attending: Cardiology | Admitting: Cardiology

## 2023-11-27 VITALS — BP 122/74 | HR 87 | Ht 70.0 in | Wt 170.0 lb

## 2023-11-27 DIAGNOSIS — E782 Mixed hyperlipidemia: Secondary | ICD-10-CM

## 2023-11-27 DIAGNOSIS — I10 Essential (primary) hypertension: Secondary | ICD-10-CM

## 2023-11-27 DIAGNOSIS — I340 Nonrheumatic mitral (valve) insufficiency: Secondary | ICD-10-CM

## 2023-11-27 DIAGNOSIS — I428 Other cardiomyopathies: Secondary | ICD-10-CM

## 2023-11-27 DIAGNOSIS — I471 Supraventricular tachycardia, unspecified: Secondary | ICD-10-CM

## 2023-11-27 DIAGNOSIS — I5022 Chronic systolic (congestive) heart failure: Secondary | ICD-10-CM

## 2023-11-27 NOTE — Progress Notes (Signed)
 Cardiology Office Note:   Date:  11/27/2023  ID:  [name], DOB March 28, 1956, MRN [id number] PCP: Kennyth Worth HERO, MD   HeartCare Providers Cardiologist:  Maude Emmer, MD    History of Present Illness:   Discussed the use of AI scribe software for clinical note transcription with the patient, who gave verbal consent to proceed.  History of Present Illness [name] is a 68 y.o. male with a hx of chronic HFrEF, NICM, nonobstructive CAD, bifascicular block, hypertension, AML in remission status post BMT 2000, MDS, hyperlipidemia. He presents for follow-up after recent hospitalization for acute congestive heart failure.  [name] used to be followed by a cardiologist at [facility] but has been followed by Dr. Emmer for several years now.  Has had some systolic dysfunction mildly reduced EF since at least 2022 and reports of MR being of mild to moderate severity since 2020/2021 based off of prior notes.  Coronary CT 2022 with CAC score of 129 with minimal CAD.  Eventually he had a cardiac MRI in February 2023 that demonstrated an EF of 36% had normal RV with LGE pattern and basal septum felt to be related to prior myocarditis.  Seen briefly in the ED earlier in March of this year for bradycardia but related to PVCs.  3-day heart monitor without any significant findings, mostly sinus with 6 beat of SVT no significant ventricular ectopy.  He was just seen outpatient 10/19/2023 and doing well on GDMT.   He was recently hospitalized earlier this month for acute congestive heart failure, experiencing symptoms of fluid overload such as heaviness in his feet and legs, shortness of breath, and abdominal distension. During hospitalization, a surface echo showed EF 30 to 35% with inferior/inferolateral wall severe hypokinesis concerning for RCA/LCx ischemia. Transesophageal echocardiogram showed moderate to severe mitral valve regurgitation with tethering of p2 segment of posterior leaflet.  Right and left heart catheterization revealed mild nonobstructive coronary artery disease with normal right heart pressures.  Post-hospitalization, he feels significantly better. Reports stable weights (though is frustrated by what he feels is too low of a weight for him). He is taking Lasix  20 mg once daily and has doubled the dose twice since discharge due to overnight weight increase (has not had any symptoms). He is also on Jardiance , has not resumed Entresto  due to elevated creatinine during hospitalization. He has not been monitoring his blood pressure at home recently but reports it was well-controlled prior to hospitalization.  Although he is feeling better, he still describes a sense of not being fully back to his pre-hospitalization state, citing reduced energy levels. He has resumed some physical activities, such as playing golf, but notes feeling more tired than usual. No chest pain or significant shortness of breath during these activities.  He is concerned about the impact of his medications on erectile dysfunction, noting an increase in his metoprolol  dose from 25 mg to 37.5 mg daily. He also inquires about the use of meloxicam  for arthritis pain, which he takes occasionally.  Today patient denies chest pain, shortness of breath, lower extremity edema, palpitations, melena, hematuria, hemoptysis, diaphoresis, weakness, presyncope, syncope, orthopnea, and PND.   Studies Reviewed:    11/11/23 TEE  IMPRESSIONS     1. Left ventricular ejection fraction, by estimation, is 30 to 35%. The  left ventricle has moderately decreased function. The left ventricle  demonstrates global hypokinesis. The left ventricular internal cavity size  was mildly dilated. There is severe  hypokinesis of the left  ventricular, basal-mid inferior wall and  inferolateral wall.   2. Right ventricular systolic function was not well visualized. The right  ventricular size is normal.   3. Left atrial size was  mildly dilated. No left atrial/left atrial  appendage thrombus was detected. The LAA emptying velocity was 29 cm/s.   4. Tethering of the P2 segment of the posterior leaflet. RVOl 33 mL, ERO  0.20 cm, MR radius 0.7 cm. The mitral valve is grossly normal. Moderate to  severe mitral valve regurgitation. No evidence of mitral stenosis.   5. The aortic valve is tricuspid. Aortic valve regurgitation is trivial.  No aortic stenosis is present.   6. 3D performed of the mitral valve and demonstrates demonstrates normal  valve architecture but the P2 segment of the posterior leaflet is  tethered.   11/10/23 LHC/RHC   Prox RCA lesion is 20% stenosed.   Mid RCA to Dist RCA lesion is 20% stenosed.   Ramus lesion is 20% stenosed.   2nd Diag lesion is 50% stenosed.   Mid LAD lesion is 20% stenosed.   Mild non-obstructive CAD Normal right heart pressures PCWP V wave 20 mm Hg, mean 15 mmHg LVEDP 20 mmHg   Recommendations: No obstructive CAD. His mitral regurgitation is not ischemia mediated. Continue planning for possible mitral valve surgery with TEE tomorrow.    Risk Assessment/Calculations:              Physical Exam:   VS:  BP 122/74   Pulse 87   Ht 5' 10 (1.778 m)   Wt 170 lb (77.1 kg)   SpO2 98%   BMI 24.39 kg/m    Wt Readings from Last 3 Encounters:  11/27/23 170 lb (77.1 kg)  11/09/23 162 lb 0.6 oz (73.5 kg)  10/19/23 170 lb (77.1 kg)     Physical Exam Vitals reviewed.  Constitutional:      Appearance: Normal appearance.  HENT:     Head: Normocephalic.  Eyes:     Pupils: Pupils are equal, round, and reactive to light.  Cardiovascular:     Rate and Rhythm: Normal rate and regular rhythm.     Pulses: Normal pulses.     Heart sounds: Murmur (systolic over apex) heard.  Pulmonary:     Effort: Pulmonary effort is normal.     Breath sounds: Normal breath sounds.  Abdominal:     General: Abdomen is flat.     Palpations: Abdomen is soft.  Musculoskeletal:     Right  lower leg: No edema.     Left lower leg: No edema.  Skin:    General: Skin is warm and dry.     Capillary Refill: Capillary refill takes less than 2 seconds.  Neurological:     General: No focal deficit present.     Mental Status: He is alert and oriented to person, place, and time.  Psychiatric:        Mood and Affect: Mood normal.        Behavior: Behavior normal.        Thought Content: Thought content normal.        Judgment: Judgment normal.     ASSESSMENT AND PLAN:    Assessment & Plan Acute on Chronic Congestive Heart Failure Chronic heart failure with reduced ejection fraction, LVEF of 36% on cardiac MRI in February 2023. Managed with guideline-directed medical therapy, including Jardiance  and metoprolol . Recently hospitalized for acute congestive heart failure with symptoms of fluid overload, including abdominal distension and  heaviness in the legs. Managed with diuretics, resulting in weight loss and symptom improvement. Currently on Lasix  20 mg daily and Jardiance , with instructions to double Lasix  if weight increases by more than three pounds overnight. Reports feeling better but not fully back to baseline, with some residual fatigue. Blood pressure remains well-controlled. - Continue Lasix  20 mg daily - Continue Jardiance  10mg  - Continue Toprol  XL 37.5mg  - Monitor weight daily and double Lasix  dose if weight increases by more than three pounds overnight - Check kidney function with labs. If creatinine back to baseline, will plan to resume ARB or ARNI - Will also consider MRA  Mitral Valve Regurgitation TTE and TEE during recent admission show mitral valve regurgitation has progressed to moderate to severe with tethering of p2 segment of posterior leaflet. MR felt to be related to wall motion abnormality.Current stability with medical management but discussed that surgical intervention could be necessary in the future. - Continue medical management with HF GDMT and diuretic  as above. - Discuss potential future valve repair plans with Dr. Delford.  Hypertension Blood pressure well-controlled following discharge even without Entresto , with recent readings around 120/80 mmHg. Currently on metoprolol  succinate 37.5mg . No immediate changes needed in management. - Continue metoprolol  succinate 37.5mg  daily.  Hyperlipidemia Hyperlipidemia with controlled cholesterol levels as of last check in December.  Supraventricular tachycardia Patient with recent paroxysmal SVT prior to and during recent admission. No symptoms of palpitations since discharge.  - Continue Metoprolol  Succinate 37.5mg .  Arthritis Experiences arthritis pain, particularly in the hand, and has been using meloxicam  intermittently. Use of NSAIDs discouraged due to potential adverse effects on heart failure and kidney function. Advised to use Tylenol  instead for pain management. - Advise against the use of NSAIDs like meloxicam  - Recommend using Tylenol  for pain management         Signed, Artist Pouch, PA-C

## 2023-11-27 NOTE — Patient Instructions (Addendum)
 Medication Instructions:   Your physician recommends that you continue on your current medications as directed. Please refer to the Current Medication list given to you today.   *If you need a refill on your cardiac medications before your next appointment, please call your pharmacy*    Lab Work: PLEASE GO DOWN STAIRS  [facility]  FIRST FLOOR  [address] ( GET OFF ELEVATORS MAKE A LEFT AND ANOTHER LEFT LAB ON RIGHT DOWN HALLWAY : BMET     If you have labs (blood work) drawn today and your tests are completely normal, you will receive your results only by: MyChart Message (if you have MyChart) OR A paper copy in the mail If you have any lab test that is abnormal or we need to change your treatment, we will call you to review the results.  Testing/Procedures: NONE ORDERED  TODAY     Follow-Up: At [facility], you and your health needs are our priority.  As part of our continuing mission to provide you with exceptional heart care, our providers are all part of one team.  This team includes your primary Cardiologist (physician) and Advanced Practice Providers or APPs (Physician Assistants and Nurse Practitioners) who all work together to provide you with the care you need, when you need it.  Your next appointment:    3-4  month(s)  Provider:   Maude Emmer, MD OR  PATICIA APP    We recommend signing up for the patient portal called MyChart.  Sign up information is provided on this After Visit Summary.  MyChart is used to connect with patients for Virtual Visits (Telemedicine).  Patients are able to view lab/test results, encounter notes, upcoming appointments, etc.  Non-urgent messages can be sent to your provider as well.   To learn more about what you can do with MyChart, go to [email or web address].   Other Instructions       1st Floor: - Lobby - Registration  - Pharmacy  - Lab - Cafe  2nd Floor: - PV Lab - Diagnostic Testing (echo, CT, nuclear  med)  3rd Floor: - Vacant  4th Floor: - TCTS (cardiothoracic surgery) - AFib Clinic - Structural Heart Clinic - Vascular Surgery  - Vascular Ultrasound  5th Floor: - HeartCare Cardiology (general and EP) - Clinical Pharmacy for coumadin, hypertension, lipid, weight-loss medications, and med management appointments    Valet parking services will be available as well.

## 2023-11-30 DIAGNOSIS — I340 Nonrheumatic mitral (valve) insufficiency: Secondary | ICD-10-CM

## 2023-12-01 ENCOUNTER — Telehealth: Payer: Self-pay | Admitting: *Deleted

## 2023-12-01 ENCOUNTER — Encounter: Payer: Self-pay | Admitting: *Deleted

## 2023-12-01 ENCOUNTER — Telehealth: Payer: Self-pay

## 2023-12-01 DIAGNOSIS — I5022 Chronic systolic (congestive) heart failure: Secondary | ICD-10-CM

## 2023-12-01 DIAGNOSIS — Z79899 Other long term (current) drug therapy: Secondary | ICD-10-CM

## 2023-12-01 DIAGNOSIS — R7989 Other specified abnormal findings of blood chemistry: Secondary | ICD-10-CM

## 2023-12-01 LAB — BASIC METABOLIC PANEL WITH GFR
BUN/Creatinine Ratio: 24 (ref 10–24)
BUN: 35 mg/dL — ABNORMAL HIGH (ref 8–27)
CO2: 23 mmol/L (ref 20–29)
Calcium: 9.8 mg/dL (ref 8.6–10.2)
Chloride: 105 mmol/L (ref 96–106)
Creatinine, Ser: 1.43 mg/dL — ABNORMAL HIGH (ref 0.76–1.27)
Glucose: 95 mg/dL (ref 70–99)
Potassium: 4.7 mmol/L (ref 3.5–5.2)
Sodium: 141 mmol/L (ref 134–144)
eGFR: 53 mL/min/{1.73_m2} — ABNORMAL LOW (ref >59)

## 2023-12-01 MED ORDER — EMPAGLIFLOZIN 10 MG PO TABS: 10.0000 mg | ORAL_TABLET | Freq: Every day | ORAL | 2 refills | Status: DC

## 2023-12-01 MED ORDER — FUROSEMIDE 20 MG PO TABS: 20.0000 mg | ORAL_TABLET | Freq: Every day | ORAL | 2 refills | Status: DC

## 2023-12-01 MED ORDER — PRAVASTATIN SODIUM 20 MG PO TABS: 20.0000 mg | ORAL_TABLET | Freq: Every day | ORAL | 2 refills | Status: DC

## 2023-12-01 MED ORDER — METOPROLOL SUCCINATE ER 25 MG PO TB24: 37.5000 mg | ORAL_TABLET | Freq: Every day | ORAL | 2 refills | Status: DC

## 2023-12-01 MED ORDER — ENTRESTO 24-26 MG PO TABS: 1.0000 | ORAL_TABLET | Freq: Two times a day (BID) | ORAL | 2 refills | Status: DC

## 2023-12-01 NOTE — Telephone Encounter (Signed)
 Pts consult appt with Structural Dr. Arlester Ladd, is scheduled for 12/23/23 at 0840.  Pt made aware of appt date and time by Structural RN Navigator.

## 2023-12-01 NOTE — Telephone Encounter (Signed)
 Left the pt a message to call the office back.  Sent him these results in his mychart account to review and get back with us  as well.   Will go ahead and place the repeat BMET in one week in the system and let the pt know when he calls back to have this done around 4/29, at a [facility].  BMET order placed and released in the system for then.  Will go ahead and add low dose Entresto  24/26 mg bid back to his med list as well.    Will make the pt aware in his mychart that per Leala Prince PA-C, that he should only be taking an extra dose of lasix  with weight gain of at least 3 lbs and to remind him to be really careful about salt intake and trying to drink a consistent amount of fluid per day, but should limit to ~2L.

## 2023-12-01 NOTE — Telephone Encounter (Signed)
 Message below is copied from previous mychart message from today about results.    Me     12/01/23  4:26 PM Result Note Pt viewed lab results and recommendations, as indicated in this thread. He will have his BMET done on next Monday 4/28, due to being out of town 4/29 -5/2. Also sent in refills of Entresto  24/26 mg bid along with his other cardiac medications, to his confirmed pharmacy of choice. Pt aware and agrees with this plan. Basic Metabolic Panel (BMET) Me to [name] "[name]"      12/01/23  4:23 PM [name], getting your lab done on Monday 4/28 is completely fine.  You can go to any [facility] of your choosing with no appt required.  They do break for lunch from 12-1 pm and lab hours are typically 8 am to 5 pm at each location.  I will drop some different [facility] locations below for your reference.   Our new building we are moving to will have a [facility] on the first floor starting next Monday, if you'd like to come there to have this drawn.  Our new address starting next Monday at the new building will be on [address] [place] [address].   You can most definitely hold onto the higher dose Entresto , for the goal is to slowly titrate-up the dose of this medication over time. I did send in the 24/26 mg dose to [name] for you, along with all the other cardiac medication refills you requested.    Thank you for getting back with me, and looks like you're all set to go.  Hope you have a good rest of your evening.    Warm regards, [name]      [facility] Contact for Alternative locations and appointment scheduling    [email or web address]    [email or web address]   [phone]       Last read by [name] "[name]" at 4:23PM on 12/01/2023. Basic Metabolic Panel (BMET)   12/01/23  4:14 PM You routed this conversation to Melvin Rule, MD   12/01/23  4:12 PM You routed this conversation to Melvin Prince, PA-C [name] "[name]" to Me      12/01/23   4:03 PM [name], I have 180 pills of Entresto , a dosage of 97 mg and 103 mg. Should I hold on to them?   I need a prescription for the new dosage of Entresto  24/26, as well as refills for Jardiance  10mg , Metoprolol  37.5mg , Lasix  20mg , and Pravastatin  20mg . I use the [facility] on [address].  I am traveling out of town next week, 4/29-5/2. Is it okay to get my blood drawn on Monday, April 28, before I leave town? I am finally getting into the groove of a low-salt diet. I am not drinking that much water now. I will drink more water.  Thank you, [name] [phone]

## 2023-12-01 NOTE — Telephone Encounter (Signed)
-----   Message from Leala Prince sent at 12/01/2023 12:54 PM EDT ----- Slight increase in creatinine from prior lab but within range of recent values. I've reviewed with Dr. Nishan and given overall stability of creatinine, we can resume low dose Entresto  24-26mg  BID. I would recommend that we repeat a BMET once more in a week to follow both creatinine and potassium levels. Please encourage patient to only take extra lasix  with weight gain of at least 3 pounds. Please copy Dr. Stann Earnest on the repeat BMET.

## 2023-12-01 NOTE — Telephone Encounter (Signed)
 Melvin Prince, PA-C  P Cv Div Ch St Triage Please refer/arrange appointment for this patient with Dr. Collin Deal in structural clinic for mitraclip evaluation.  Melvin Prince, PA-C       Previous Messages    ----- Message ----- From: Loyde Rule, MD Sent: 11/29/2023  10:10 PM EDT To: Antonetta Kitchen, RN; Melvin Prince, PA-C  Can refer to [email or web address] for MR [name] is retiring and MV surgery would likely need to be done elsewhere but shoujld have clip conversation ----- Message ----- From: Melvin Prince, PA-C Sent: 11/27/2023   7:03 PM EDT To: Loyde Rule, MD  Patient doing very well following recent admission. Stable weight and no dyspnea. Is back to playing golf but still feels a little more fatigued than he used to. I'm checking BMET and will plan to either resume Entresto  or if BP unable to tolerate, ARB. Do you want me to refer to CT surgery/structural team?

## 2023-12-01 NOTE — Telephone Encounter (Signed)
 [name]

## 2023-12-02 ENCOUNTER — Telehealth: Payer: Self-pay

## 2023-12-02 NOTE — Telephone Encounter (Signed)
 [name]    Fossa appears approachable for trans septal access, device straddle and steering to the valve. Patient appears to make challenging imaging windows with some typical screening images absent from this study (3D with color/4 chamber to approximate trans septal height, LVOT/grasping views with posterior leaflet to annulus in view at defects). Posterior leaflet not seen tip to annulus in LVOT views offered, however, appears to have sufficient length.  Anterior leaflet appears to be <.7cm in some 0* views offered. IC with color shows large lateral A2P2 jet. A smaller medial jet appears to converge centrally with the lateral jet. 3D without color suggests lateral A2P2 coaptation defect.  Recommend a complete bicom/Xplane sweep w/without color to assess valve anatomy at defect location(s) prior to confirming device selection.

## 2023-12-07 LAB — BASIC METABOLIC PANEL WITH GFR
BUN/Creatinine Ratio: 30 — ABNORMAL HIGH (ref 10–24)
BUN: 46 mg/dL — ABNORMAL HIGH (ref 8–27)
CO2: 22 mmol/L (ref 20–29)
Calcium: 10.4 mg/dL — ABNORMAL HIGH (ref 8.6–10.2)
Chloride: 101 mmol/L (ref 96–106)
Creatinine, Ser: 1.53 mg/dL — ABNORMAL HIGH (ref 0.76–1.27)
Glucose: 91 mg/dL (ref 70–99)
Potassium: 4.6 mmol/L (ref 3.5–5.2)
Sodium: 139 mmol/L (ref 134–144)
eGFR: 49 mL/min/{1.73_m2} — ABNORMAL LOW (ref >59)

## 2023-12-10 ENCOUNTER — Encounter: Payer: Self-pay | Admitting: *Deleted

## 2023-12-10 DIAGNOSIS — R7989 Other specified abnormal findings of blood chemistry: Secondary | ICD-10-CM

## 2023-12-10 DIAGNOSIS — Z79899 Other long term (current) drug therapy: Secondary | ICD-10-CM

## 2023-12-10 DIAGNOSIS — I5022 Chronic systolic (congestive) heart failure: Secondary | ICD-10-CM

## 2023-12-10 NOTE — Telephone Encounter (Signed)
 BMET order placed for the pt to have done on 5/14 and released in the system.  Mychart message sent to the pt about this along with his results per Leala Prince PA-C.  Will monitor for pt to review.

## 2023-12-10 NOTE — Telephone Encounter (Signed)
-----   Message from Leala Prince sent at 12/10/2023 10:00 AM EDT ----- Repeat basic metabolic panel after starting Entresto  shows a slight rise in creatinine. This typically occurs following new initiation or restart of Entresto . I would recommend that we repeat a BMP once more in a couple of weeks to ensure stability, could do this when he sees Dr. Arlester Ladd in a few weeks.

## 2023-12-17 DIAGNOSIS — Z789 Other specified health status: Secondary | ICD-10-CM

## 2023-12-17 DIAGNOSIS — I5022 Chronic systolic (congestive) heart failure: Secondary | ICD-10-CM

## 2023-12-18 IMAGING — MR MR CARD MORPHOLOGY WO/W CM
45 of 48 series · 45 of 48 positions shown · IV contrast (Contrast agent)
Comparison: none

CLINICAL DATA: Nonischemic cardiomyopathy

EXAM:
CARDIAC MRI
TECHNIQUE: The patient was scanned on a 1.5 Tesla GE magnet. A dedicated
cardiac coil was used. Functional imaging was done using Fiesta
sequences. [DATE], and 4 chamber views were done to assess for RWMA's.
Modified Gholam rule using a short axis stack was used to
calculate an ejection fraction on a dedicated work station using
Circle software. The patient received 8 cc of Gadavist. After 10
minutes inversion recovery sequences were used to assess for
infiltration and scar tissue.
CONTRAST:  Gadavist 8 cc

[Series 4: t2_haste_db_tra_bh · axial · 8.0mm · 1.41mm/px · 1 of 16 slices shown]
[im 1/16]
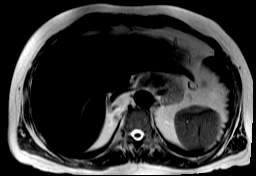

[Series 8: bSSFP · oblique · 8.0mm · 1.61mm/px · 1 of 25 slices shown (1 of 19)]
[im 1/25]
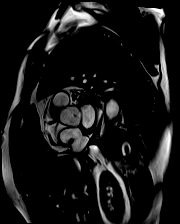

[Series 9: bSSFP · oblique · 8.0mm · 1.61mm/px · 1 of 25 slices shown (2 of 19)]
[im 1/25]
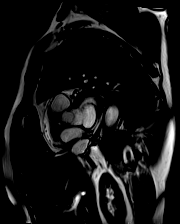

[Series 10: bSSFP · oblique · 8.0mm · 1.61mm/px · 1 of 25 slices shown (3 of 19)]
[im 1/25]
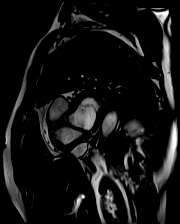

[Series 11: bSSFP · oblique · 8.0mm · 1.61mm/px · 1 of 25 slices shown (4 of 19)]
[im 1/25]
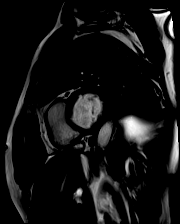

[Series 12: bSSFP · oblique · 8.0mm · 1.61mm/px · 1 of 25 slices shown (5 of 19)]
[im 1/25]
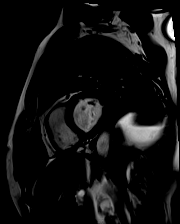

[Series 13: bSSFP · oblique · 8.0mm · 1.61mm/px · 1 of 25 slices shown (6 of 19)]
[im 1/25]
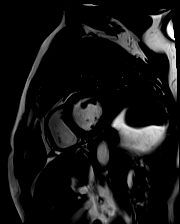

[Series 14: bSSFP · oblique · 8.0mm · 1.61mm/px · 1 of 25 slices shown (7 of 19)]
[im 1/25]
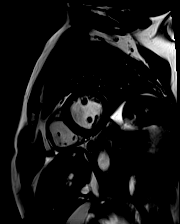

[Series 15: bSSFP · oblique · 8.0mm · 1.61mm/px · 1 of 25 slices shown (8 of 19)]
[im 1/25]
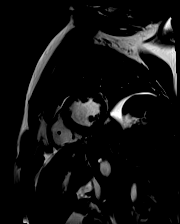

[Series 16: bSSFP · oblique · 8.0mm · 1.61mm/px · 1 of 25 slices shown (9 of 19)]
[im 1/25]
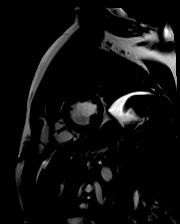

[Series 17: bSSFP · oblique · 8.0mm · 1.61mm/px · 1 of 25 slices shown (10 of 19)]
[im 1/25]
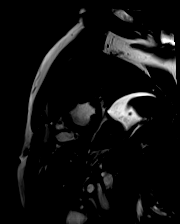

[Series 18: bSSFP · oblique · 8.0mm · 1.61mm/px · 1 of 25 slices shown (11 of 19)]
[im 1/25]
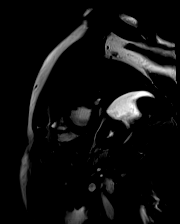

[Series 19: bSSFP · oblique · 8.0mm · 1.61mm/px · 1 of 25 slices shown (12 of 19)]
[im 1/25]
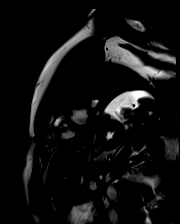

[Series 20: bSSFP · oblique · 8.0mm · 1.61mm/px · 1 of 25 slices shown (13 of 19)]
[im 1/25]
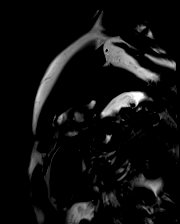

[Series 21: bSSFP · oblique · 8.0mm · 1.61mm/px · 1 of 25 slices shown (14 of 19)]
[im 1/25]
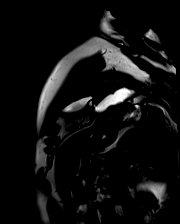

[Series 22: bSSFP · oblique · 8.0mm · 1.61mm/px · 1 of 25 slices shown (15 of 19)]
[im 1/25]
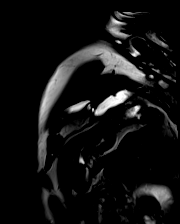

[Series 23: bSSFP · oblique · 8.0mm · 1.61mm/px · 1 of 25 slices shown (16 of 19)]
[im 1/25]
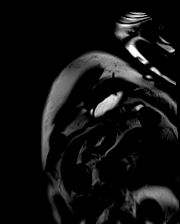

[Series 24: bSSFP · oblique · 6.0mm · 1.41mm/px · 1 of 25 slices shown (17 of 19)]
[im 1/25]
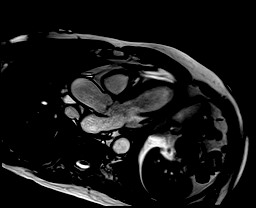

[Series 25: bSSFP · sagittal · 6.0mm · 1.41mm/px · 1 of 25 slices shown (18 of 19)]
[im 1/25]
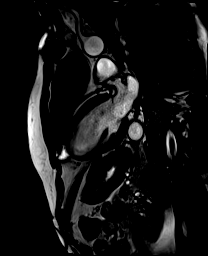

[Series 26: bSSFP · axial · 6.0mm · 1.41mm/px · 1 of 25 slices shown (19 of 19)]
[im 1/25]
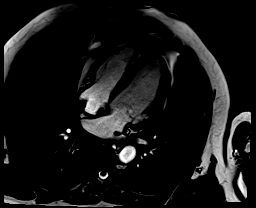

[Series 27: (id)_long_t1 · oblique · 8.0mm · 1.56mm/px · 1 of 24 slices shown]
[im 1/24]
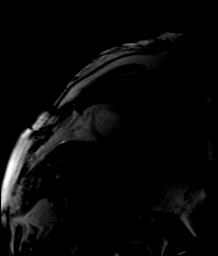

[Series 28: (id)_long_t1_moco · oblique · 8.0mm · 1.56mm/px · 1 of 24 slices shown]
[im 1/24]
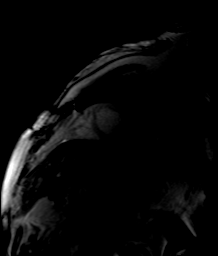

[Series 29: (id)_long_t1_moco_t1 · oblique · 8.0mm · 1.56mm/px · 1 of 6 slices shown]
[im 1/6]
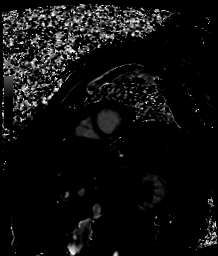

[Series 31: cine_trufi_short axis_cs_2_shot · oblique · 8.0mm · 1.48mm/px · 1 of 25 slices shown (1 of 19)]
[im 1/25]
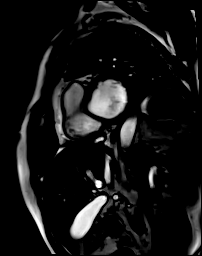

[Series 31: cine_trufi_short axis_cs_2_shot · oblique · 8.0mm · 1.48mm/px · 1 of 25 slices shown (2 of 19)]
[im 1/25]
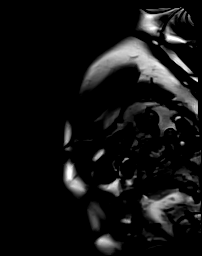

[Series 31: cine_trufi_short axis_cs_2_shot · oblique · 8.0mm · 1.48mm/px · 1 of 25 slices shown (3 of 19)]
[im 1/25]
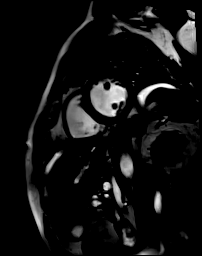

[Series 31: cine_trufi_short axis_cs_2_shot · oblique · 8.0mm · 1.48mm/px · 1 of 25 slices shown (4 of 19)]
[im 1/25]
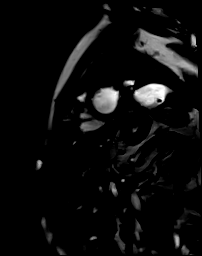

[Series 31: cine_trufi_short axis_cs_2_shot · oblique · 8.0mm · 1.48mm/px · 1 of 25 slices shown (5 of 19)]
[im 1/25]
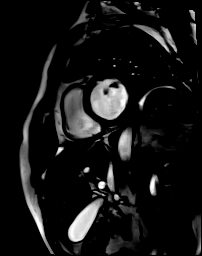

[Series 31: cine_trufi_short axis_cs_2_shot · oblique · 8.0mm · 1.48mm/px · 1 of 25 slices shown (6 of 19)]
[im 1/25]
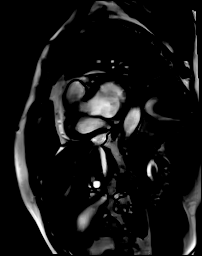

[Series 31: cine_trufi_short axis_cs_2_shot · oblique · 8.0mm · 1.48mm/px · 1 of 25 slices shown (7 of 19)]
[im 1/25]
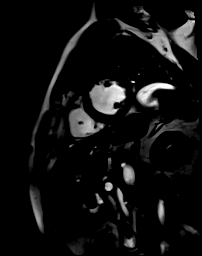

[Series 31: cine_trufi_short axis_cs_2_shot · oblique · 8.0mm · 1.48mm/px · 1 of 25 slices shown (8 of 19)]
[im 1/25]
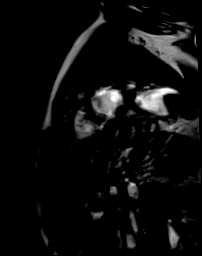

[Series 31: cine_trufi_short axis_cs_2_shot · oblique · 8.0mm · 1.48mm/px · 1 of 25 slices shown (9 of 19)]
[im 1/25]
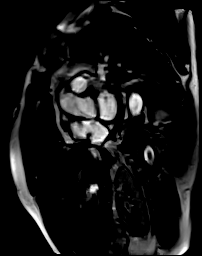

[Series 31: cine_trufi_short axis_cs_2_shot · oblique · 8.0mm · 1.48mm/px · 1 of 25 slices shown (10 of 19)]
[im 1/25]
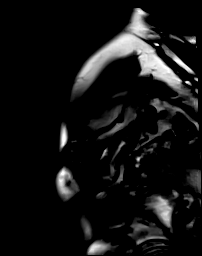

[Series 31: cine_trufi_short axis_cs_2_shot · oblique · 8.0mm · 1.48mm/px · 1 of 25 slices shown (11 of 19)]
[im 1/25]
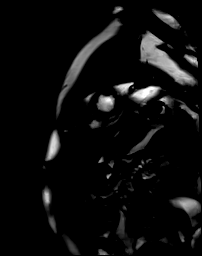

[Series 31: cine_trufi_short axis_cs_2_shot · oblique · 8.0mm · 1.48mm/px · 1 of 25 slices shown (12 of 19)]
[im 1/25]
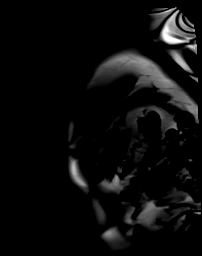

[Series 31: cine_trufi_short axis_cs_2_shot · oblique · 8.0mm · 1.48mm/px · 1 of 25 slices shown (13 of 19)]
[im 1/25]
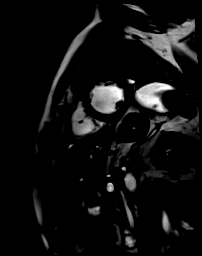

[Series 31: cine_trufi_short axis_cs_2_shot · oblique · 8.0mm · 1.48mm/px · 1 of 25 slices shown (14 of 19)]
[im 1/25]
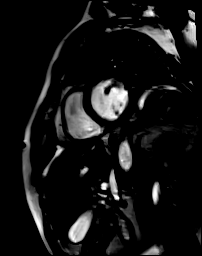

[Series 31: cine_trufi_short axis_cs_2_shot · oblique · 8.0mm · 1.48mm/px · 1 of 25 slices shown (15 of 19)]
[im 1/25]
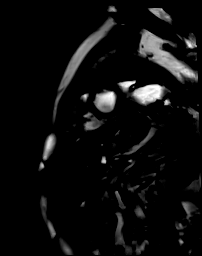

[Series 31: cine_trufi_short axis_cs_2_shot · oblique · 8.0mm · 1.48mm/px · 1 of 25 slices shown (16 of 19)]
[im 1/25]
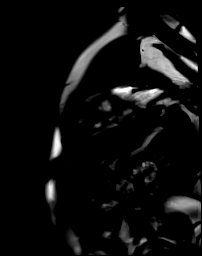

[Series 31: cine_trufi_short axis_cs_2_shot · oblique · 8.0mm · 1.48mm/px · 1 of 25 slices shown (17 of 19)]
[im 1/25]
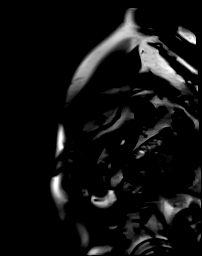

[Series 31: cine_trufi_short axis_cs_2_shot · oblique · 8.0mm · 1.48mm/px · 1 of 25 slices shown (18 of 19)]
[im 1/25]
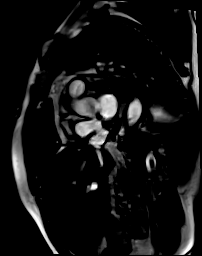

[Series 32: (id)_trufi · oblique · 8.0mm · 2.08mm/px · 1 of 9 slices shown]
[im 1/9]
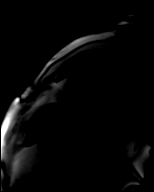

[Series 33: (id)_trufi_moco · oblique · 8.0mm · 2.08mm/px · 1 of 9 slices shown]
[im 1/9]
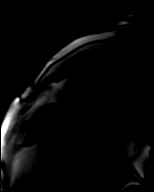

[Series 34: (id)_trufi_moco_t2 · oblique · 8.0mm · 2.08mm/px · 1 of 3 slices shown]
[im 1/3]
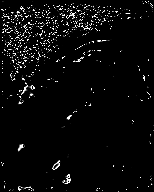

[Series 36: cine_trufi_short axis_cs_2_shot · oblique · 8.0mm · 1.48mm/px · 1 of 25 slices shown (19 of 19)]
[im 1/25]
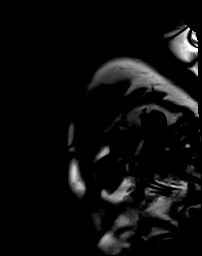

[45 of 48 positions shown; findings below may reference images not displayed]

FINDINGS: Limited images of the lung fields showed no gross abnormalities.

Normal left ventricular size with normal wall thickness. Diffuse
moderate hypokinesis, EF 36%. Normal right ventricular size with
moderate hypokinesis, EF 35%. Normal left and right atrial sizes. No
significant mitral regurgitation noted. Trileaflet aortic valve with
no stenosis or regurgitation.

Normal first pass perfusion images.

Delayed enhancement imaging was difficult (poor quality), but there
did appear to be small areas of mid-wall late gadolinium enhancement
(LGE) in the basal septum.

MEASUREMENTS:
MEASUREMENTS
LVEDV 150 mL
LVSV 55 mL
LVEF 36%

RVEDV 119 mL
RVSV 41 mL
RVEF 35%

T1 1464, ECV 36% basal septum.

T2 45 basal septum
IMPRESSION: 1. Normal LV size and wall thickness, diffuse hypokinesis with EF
36%.

2.  Normal RV size with moderate hypokinesis, EF 35%.

3. Non-coronary somewhat subtle LGE pattern in the basal septum.
Mid-wall LGE most suggestive of prior myocarditis. Elevated
extracellular volume percentage in the basal septum suggestive of
fibrosis. Basal septal T2 not elevated, doubt active inflammation.

Arnab Parson

## 2023-12-21 ENCOUNTER — Ambulatory Visit (INDEPENDENT_AMBULATORY_CARE_PROVIDER_SITE_OTHER): Admitting: Family Medicine

## 2023-12-21 ENCOUNTER — Encounter: Payer: Self-pay | Admitting: Family Medicine

## 2023-12-21 VITALS — BP 116/67 | HR 75 | Temp 97.0°F | Ht 70.0 in | Wt 167.0 lb

## 2023-12-21 DIAGNOSIS — R739 Hyperglycemia, unspecified: Secondary | ICD-10-CM | POA: Diagnosis not present

## 2023-12-21 DIAGNOSIS — I5022 Chronic systolic (congestive) heart failure: Secondary | ICD-10-CM | POA: Diagnosis not present

## 2023-12-21 DIAGNOSIS — I5020 Unspecified systolic (congestive) heart failure: Secondary | ICD-10-CM

## 2023-12-21 DIAGNOSIS — I10 Essential (primary) hypertension: Secondary | ICD-10-CM | POA: Diagnosis not present

## 2023-12-21 NOTE — Assessment & Plan Note (Signed)
 Blood pressure at goal today on metoprolol  37.5 mg daily and Entresto  24-26 twice daily.

## 2023-12-21 NOTE — Assessment & Plan Note (Signed)
 Had lengthy discussion with patient today regarding his recent hospitalization for CHF exacerbation.  Overall feels much better today.  He has been compliant with his medications without any side effects.  He is currently on Entresto  24-26 twice daily, metoprolol  succinate 37.5 mg daily, Jardiance  10 mg daily, and Lasix  20 mg daily per cardiology.  Euvolemic today.  We did discuss importance of low-sodium diet and regular exercise.  Also did discuss his recent echocardiogram findings.  He can discuss this further with cardiology that would ideally withhold any interventions for as long as possible.

## 2023-12-21 NOTE — Assessment & Plan Note (Signed)
 A1c's have been controlled on Jardiance  10 mg daily.  He has never had A1c in the diabetic range though we did discuss potential addition of GLP-1 agonist at some point in the future for additional cardioprotective benefits if he does have elevated A1c or blood glucose.

## 2023-12-21 NOTE — Progress Notes (Signed)
 b  [name] is a 68 y.o. male who presents today for an office visit.  Assessment/Plan:  Chronic Problems Addressed Today: HFrEF (heart failure with reduced ejection fraction) (HCC) Had lengthy discussion with patient today regarding his recent hospitalization for CHF exacerbation.  Overall feels much better today.  He has been compliant with his medications without any side effects.  He is currently on Entresto  24-26 twice daily, metoprolol  succinate 37.5 mg daily, Jardiance  10 mg daily, and Lasix  20 mg daily per cardiology.  Euvolemic today.  We did discuss importance of low-sodium diet and regular exercise.  Also did discuss his recent echocardiogram findings.  He can discuss this further with cardiology that would ideally withhold any interventions for as long as possible.  Essential hypertension, benign Blood pressure at goal today on metoprolol  37.5 mg daily and Entresto  24-26 twice daily.  Hyperglycemia A1c's have been controlled on Jardiance  10 mg daily.  He has never had A1c in the diabetic range though we did discuss potential addition of GLP-1 agonist at some point in the future for additional cardioprotective benefits if he does have elevated A1c or blood glucose.     Subjective:  HPI:  See A/P for status of chronic conditions.  Patient is here today for follow-up.  I last saw him about 5 months ago.  Since our last visit, he was admitted for HFrEF exacerbation about 6 weeks ago.  During hospitalization echocardiogram at that time showed EF of 30 to 35%.  Underwent catheterization which showed mild nonobstructive CAD.  He was discharged on Lasix  20 mg daily.  Also continued on Jardiance  10 mg daily and metoprolol  succinate 37.5 mg daily. Since his discharge, he has been doing well. He has lost some weight loss the last few weeks.  He has been consistent with medications.  He is doing well with his current regimen.  He is able to maintain his normal exercise capacity.  No  shortness of breath.  He did see cardiology couple weeks ago and was restarted on Entresto .       Objective:  Physical Exam: BP 116/67   Pulse 75   Temp (!) 97 F (36.1 C) (Temporal)   Ht 5\' 10"  (1.778 m)   Wt 167 lb (75.8 kg)   SpO2 100%   BMI 23.96 kg/m   Gen: No acute distress, resting comfortably Neuro: Grossly normal, moves all extremities Psych: Normal affect and thought content  Time Spent: 45 minutes of total time was spent on the date of the encounter performing the following actions: chart review prior to seeing the patient including recent hospitalization, obtaining history, performing a medically necessary exam, counseling on the treatment plan, placing orders, and documenting in our EHR.        Jinny Mounts. Daneil Dunker, MD 12/21/2023 8:27 AM

## 2023-12-21 NOTE — Patient Instructions (Signed)
 It was very nice to see you today!  I am glad that you are feeling better.  We will not make any changes today.  Please keep up the great work with your diet and exercise.  Return if symptoms worsen or fail to improve.   Take care, Dr Daneil Dunker  PLEASE NOTE:  If you had any lab tests, please let us  know if you have not heard back within a few days. You may see your results on mychart before we have a chance to review them but we will give you a call once they are reviewed by us .   If we ordered any referrals today, please let us  know if you have not heard from their office within the next week.   If you had any urgent prescriptions sent in today, please check with the pharmacy within an hour of our visit to make sure the prescription was transmitted appropriately.   Please try these tips to maintain a healthy lifestyle:  Eat at least 3 REAL meals and 1-2 snacks per day.  Aim for no more than 5 hours between eating.  If you eat breakfast, please do so within one hour of getting up.   Each meal should contain half fruits/vegetables, one quarter protein, and one quarter carbs (no bigger than a computer mouse)  Cut down on sweet beverages. This includes juice, soda, and sweet tea.   Drink at least 1 glass of water with each meal and aim for at least 8 glasses per day  Exercise at least 150 minutes every week.

## 2023-12-23 ENCOUNTER — Ambulatory Visit: Attending: Cardiovascular Disease | Admitting: Cardiovascular Disease

## 2023-12-23 ENCOUNTER — Encounter: Payer: Self-pay | Admitting: Cardiovascular Disease

## 2023-12-23 VITALS — BP 122/70 | HR 79 | Ht 70.0 in | Wt 167.0 lb

## 2023-12-23 DIAGNOSIS — I340 Nonrheumatic mitral (valve) insufficiency: Secondary | ICD-10-CM

## 2023-12-23 NOTE — Progress Notes (Signed)
 [facility]   MULTIDISCIPLINARY HEART VALVE TEAM  Date:  12/23/2023   ID:  [name], DOB [birth year], MRN [id number]  PCP:  Rodney Clamp, MD   Chief Complaint  Patient presents with   Mitral Regurgitation     HISTORY OF PRESENT ILLNESS: [name] is a 68 y.o. male who presents for evaluation of mitral regurgitation, referred by Dr Stann Earnest.  The patient has a history of chronic heart failure with reduced ejection fraction secondary to nonischemic cardiomyopathy.  He was hospitalized in April with decompensated heart failure, with symptoms of fluid overload including leg fullness and abdominal distention.  He underwent cardiac catheterization and echo assessment during his hospitalization.  This demonstrated severe LV dysfunction with LVEF 30 to 35%, with associated moderately severe mitral regurgitation.  He then underwent cardiac catheterization demonstrating mild nonobstructive CAD with normal right heart pressures.   The patient has had regular dental care and reports no problems. He is here with his wife today. He is feeling 'great.' He plays golf regularly, walks for exercise, does resistance exercises at the gym, and does yard work. He denies shortness of breath, chest pain, edema, or functional limitation at present. At the time of his recent hospitalization, he felt abdominal bloating and epigastric pain. States that he had been feeling poorly, but these symptoms are what made him seek care in the hospital.   Past Medical History:  Diagnosis Date   Acute myeloid leukemia in remission (HCC) 1999   Anemia    Arthritis    Depression    Diverticulosis of colon (without mention of hemorrhage)    Diverticulosis of colon (without mention of hemorrhage) 04/21/2011   Esophageal reflux    Family hx colonic polyps    Family hx colonic polyps 04/21/2011   Hiatal hernia    Hiatal hernia 04/21/2011   History of kidney stones    Hyperlipemia    Hypertension     MDS (myelodysplastic syndrome) (HCC)    Psychosexual dysfunction with inhibited sexual excitement     Current Outpatient Medications  Medication Sig Dispense Refill   Ascorbic Acid (VITAMIN C) 500 MG CAPS Take 500 mg by mouth daily.     calcium -vitamin D (OSCAL WITH D) 500-200 MG-UNIT TABS tablet Take 1 tablet by mouth daily.     cetirizine  (ZYRTEC ) 10 MG tablet Take 1 tablet (10 mg total) by mouth daily. (Patient taking differently: Take 10 mg by mouth as needed.) 90 tablet 3   empagliflozin  (JARDIANCE ) 10 MG TABS tablet Take 1 tablet (10 mg total) by mouth daily. 90 tablet 2   EPIPEN 2-PAK 0.3 MG/0.3ML DEVI Inject 0.3 mg into the muscle as needed for anaphylaxis.     famotidine  (PEPCID ) 20 MG tablet Take 20 mg by mouth at bedtime.     fluticasone  (FLONASE ) 50 MCG/ACT nasal spray Place 1 spray into both nostrils daily.     furosemide  (LASIX ) 20 MG tablet Take 1 tablet (20 mg total) by mouth daily. In case of weight gain 2 to 3 lbs in 24 hrs or 5 lbs in seven days, take 2 tablets until weight back to baseline. 30 tablet 2   metoprolol  succinate (TOPROL -XL) 25 MG 24 hr tablet Take 1.5 tablets (37.5 mg total) by mouth daily. 45 tablet 2   Nutritional Supplements (JUICE PLUS FIBRE PO) Take by mouth. 2 tabs twice daily     omeprazole  (PRILOSEC ) 20 MG capsule Take 20 mg by mouth 2 (two) times daily before a  meal.     pravastatin  (PRAVACHOL ) 20 MG tablet Take 1 tablet (20 mg total) by mouth daily. 90 tablet 2   sacubitril -valsartan  (ENTRESTO ) 24-26 MG Take 1 tablet by mouth 2 (two) times daily. 60 tablet 2   sertraline  (ZOLOFT ) 50 MG tablet TAKE 1 TABLET BY MOUTH DAILY 90 tablet 3   sildenafil  (VIAGRA ) 100 MG tablet TAKE 1/2 TABLET BY MOUTH DAILY AS NEEDED FOR FOR ERECTILE DYSFUNCTION 30 tablet 1   triamcinolone  ointment (KENALOG) 0.1 % Apply 1 Application topically as needed.     valACYclovir  (VALTREX ) 500 MG tablet Take 1 tablet (500 mg total) by mouth 2 (two) times daily. As needed for flareups 90  tablet 3   No current facility-administered medications for this visit.    ALLERGIES:   Morphine and Marinol [dronabinol]   SOCIAL HISTORY:  The patient  reports that he has never smoked. He has never used smokeless tobacco. He reports current alcohol use. He reports that he does not use drugs.   FAMILY HISTORY:  The patient's family history includes Breast cancer in his mother; Cancer in his mother; Cancer (age of onset: 58) in his father; Celiac disease in his mother; Colon polyps in his mother; Heart disease in his father; Hypertension in his father; Prostate cancer in his father.   REVIEW OF SYSTEMS:  Negative except as outlined in the HPI.   PHYSICAL EXAM: VS:  BP 122/70   Pulse 79   Ht 5\' 10"  (1.778 m)   Wt 167 lb (75.8 kg)   SpO2 98%   BMI 23.96 kg/m  , BMI Body mass index is 23.96 kg/m. GEN: Well nourished, well developed, in no acute distress HEENT: normal Neck: No JVD. carotids 2+ without bruits or masses Cardiac: Heart is regular rate and rhythm with no murmur gallop Respiratory:  clear to auscultation bilaterally GI: soft, nontender, nondistended, + BS MS: no deformity or atrophy Skin: warm and dry, no rash Neuro:  Strength and sensation are intact Psych: euthymic mood, full affect   RECENT LABS: 11/08/2023: ALT 60; B Natriuretic Peptide 2,996.9 11/10/2023: Magnesium  1.9; TSH 1.316 11/11/2023: Hemoglobin 12.6; Platelets 368 12/07/2023: BUN 46; Creatinine, Ser 1.53; Potassium 4.6; Sodium 139  07/23/2023: Cholesterol 104; HDL 36.80; LDL Cholesterol 53; Total CHOL/HDL Ratio 3; Triglycerides 72.0; VLDL 14.4   Estimated Creatinine Clearance: 47.7 mL/min (A) (by C-G formula based on SCr of 1.53 mg/dL (H)).   Wt Readings from Last 3 Encounters:  12/23/23 167 lb (75.8 kg)  12/21/23 167 lb (75.8 kg)  11/27/23 170 lb (77.1 kg)     CARDIAC STUDIES: Echo:  1. While there is global left ventricular hypokinesis, the inferior and  inferolateral walls have disproportionately  severe hypokinesis, suggesting  right coronary or left circumflex artery ischemia. There is posterior  mitral leaflet tethering. Left  ventricular ejection fraction, by estimation, is 30 to 35%. The left  ventricle has moderately decreased function. The left ventricle  demonstrates global hypokinesis. The left ventricular internal cavity size  was mildly dilated. Left ventricular diastolic   parameters are consistent with Grade II diastolic dysfunction  (pseudonormalization). Elevated left atrial pressure.   2. Right ventricular systolic function is low normal. The right  ventricular size is normal. There is mildly elevated pulmonary artery  systolic pressure. The estimated right ventricular systolic pressure is  36.5 mmHg.   3. Left atrial size was mildly dilated.   4. There is posterior mitral leaflet tethering. The regurgitant jet  originates primarily at the A2-P2 commisureBy  the PISA method, the  effective regurgitant orifice area is 0.27 cm sq, regurgitant volume 35  ml, regurgitant fraction 45%. MR vena  contracta is 5 mm. The mitral valve area by planimetry is 6.25 cm sq. The  mitral valve is abnormal. Moderate to severe mitral valve regurgitation.  The mean mitral valve gradient is 2.0 mmHg with average heart rate of 82  bpm.   5. The aortic valve is tricuspid. Aortic valve regurgitation is trivial.   6. The inferior vena cava is dilated in size with >50% respiratory  variability, suggesting right atrial pressure of 8 mmHg.   Comparison(s): A prior study was performed on 2013. Prior images unable to  be directly viewed, comparison made by report only. Mitral insufficiency  appears to be substantially worse and LVEF is lower. Overall impression  suggests coronary insufficiency  superimposed on preexisting nonischemiccardiomyopathy.   Conclusion(s)/Recommendation(s): Consider TEE for MR evaluation. Consider  repeat ischemic evaluation.   Cardiac Cath:  Conclusion       Prox RCA lesion is 20% stenosed.   Mid RCA to Dist RCA lesion is 20% stenosed.   Ramus lesion is 20% stenosed.   2nd Diag lesion is 50% stenosed.   Mid LAD lesion is 20% stenosed.   Mild non-obstructive CAD Normal right heart pressures PCWP V wave 20 mm Hg, mean 15 mmHg LVEDP 20 mmHg   Recommendations: No obstructive CAD. His mitral regurgitation is not ischemia mediated. Continue planning for possible mitral valve surgery with TEE tomorrow  TEE:  1. Left ventricular ejection fraction, by estimation, is 30 to 35%. The  left ventricle has moderately decreased function. The left ventricle  demonstrates global hypokinesis. The left ventricular internal cavity size  was mildly dilated. There is severe  hypokinesis of the left ventricular, basal-mid inferior wall and  inferolateral wall.   2. Right ventricular systolic function was not well visualized. The right  ventricular size is normal.   3. Left atrial size was mildly dilated. No left atrial/left atrial  appendage thrombus was detected. The LAA emptying velocity was 29 cm/s.   4. Tethering of the P2 segment of the posterior leaflet. RVOl 33 mL, ERO  0.20 cm, MR radius 0.7 cm. The mitral valve is grossly normal. Moderate to  severe mitral valve regurgitation. No evidence of mitral stenosis.   5. The aortic valve is tricuspid. Aortic valve regurgitation is trivial.  No aortic stenosis is present.   6. 3D performed of the mitral valve and demonstrates demonstrates normal  valve architecture but the P2 segment of the posterior leaflet is  tethered.     ASSESSMENT AND PLAN: 1.  Non-rheumatic mitral regurgitation:  I have reviewed the natural history of mitral regurgitation with the patient and their family members who are present today. We have discussed the limitations of medical therapy and the poor prognosis associated with symptomatic mitral regurgitation. We have also reviewed potential treatment options, including palliative  medical therapy, conventional surgical mitral valve repair or replacement, and percutaneous mitral valve therapies such as edge-to-edge mitral valve approximation with MitraClip. We discussed treatment options in the context of this patient's specific comorbid medical conditions.  The patient has improved significantly since he was hospitalized last month.  He currently has NYHA functional class I symptoms and is essentially asymptomatic with all of his normal activities.  He remains quite active with regular exercise as outlined in the HPI.  The patient's TEE images are personally reviewed.  He does have severe LV dysfunction with LVEF  30 to 35% and significant mitral regurgitation with the regurgitant volume of 33 mL, ERO of 0.20 cm and MR radius of 0.7 cm.  Pulmonary vein flow is antegrade.  I think these findings are consistent with moderate mitral regurgitation and I would favor ongoing medical therapy.  The patient is on a good program of GDMT and he has upcoming follow-up with Dr. Stann Earnest later this summer.  I think he should have a surveillance echocardiogram about 6 months out from his previous echo and this will be ordered today.  I will plan to see him back on an as-needed basis and he can continue to follow with Dr. Stann Earnest who is on our valve team.  I am actually going to review his images with Dr. Stann Earnest to make sure he concurs that the patient's mitral regurgitation is appropriate for clinical surveillance.  I reviewed the data regarding transcatheter edge-to-edge repair in patients with chronic systolic heart failure today with the patient.  We discussed the findings of the coapt trial.  Considering his asymptomatic status, we will continue with medical therapy and echo surveillance at this time.  [name] 12/23/2023 9:06 AM     [facility] [address] [place] [address]  [phone] (office) [phone] (fax)

## 2023-12-23 NOTE — Patient Instructions (Addendum)
 Testing: ECHO Your physician has requested that you have an echocardiogram. Echocardiography is a painless test that uses sound waves to create images of your heart. It provides your doctor with information about the size and shape of your heart and how well your heart's chambers and valves are working. This procedure takes approximately one hour. There are no restrictions for this procedure. Please do NOT wear cologne, perfume, aftershave, or lotions (deodorant is allowed). Please arrive 15 minutes prior to your appointment time.  Please note: We ask at that you not bring children with you during ultrasound (echo/ vascular) testing. Due to room size and safety concerns, children are not allowed in the ultrasound rooms during exams. Our front office staff cannot provide observation of children in our lobby area while testing is being conducted. An adult accompanying a patient to their appointment will only be allowed in the ultrasound room at the discretion of the ultrasound technician under special circumstances. We apologize for any inconvenience.  Follow-Up: At [facility], you and your health needs are our priority.  As part of our continuing mission to provide you with exceptional heart care, our providers are all part of one team.  This team includes your primary Cardiologist (physician) and Advanced Practice Providers or APPs (Physician Assistants and Nurse Practitioners) who all work together to provide you with the care you need, when you need it.  Your next appointment:   As Needed  Provider:   Janelle Mediate, MD / Arnoldo Lapping, MD  We recommend signing up for the patient portal called "MyChart".  Sign up information is provided on this After Visit Summary.  MyChart is used to connect with patients for Virtual Visits (Telemedicine).  Patients are able to view lab/test results, encounter notes, upcoming appointments, etc.  Non-urgent messages can be sent to your provider as well.    To learn more about what you can do with MyChart, go to [email or web address].

## 2023-12-24 ENCOUNTER — Other Ambulatory Visit: Payer: Self-pay | Admitting: Cardiovascular Disease

## 2023-12-24 ENCOUNTER — Ambulatory Visit: Payer: Self-pay | Admitting: *Deleted

## 2023-12-24 LAB — BASIC METABOLIC PANEL WITH GFR
BUN/Creatinine Ratio: 33 — ABNORMAL HIGH (ref 10–24)
BUN: 48 mg/dL — ABNORMAL HIGH (ref 8–27)
CO2: 23 mmol/L (ref 20–29)
Calcium: 9.7 mg/dL (ref 8.6–10.2)
Chloride: 102 mmol/L (ref 96–106)
Creatinine, Ser: 1.45 mg/dL — ABNORMAL HIGH (ref 0.76–1.27)
Glucose: 100 mg/dL — ABNORMAL HIGH (ref 70–99)
Potassium: 4.8 mmol/L (ref 3.5–5.2)
Sodium: 141 mmol/L (ref 134–144)
eGFR: 52 mL/min/{1.73_m2} — ABNORMAL LOW (ref >59)

## 2023-12-24 MED ORDER — ENTRESTO 24-26 MG PO TABS: 1.0000 | ORAL_TABLET | Freq: Two times a day (BID) | ORAL | 3 refills | Status: AC

## 2024-01-08 ENCOUNTER — Encounter: Payer: Self-pay | Admitting: Family Medicine

## 2024-01-08 DIAGNOSIS — M545 Low back pain, unspecified: Secondary | ICD-10-CM

## 2024-01-08 NOTE — Telephone Encounter (Signed)
 Okay to place referral

## 2024-01-11 ENCOUNTER — Telehealth: Payer: Self-pay

## 2024-01-11 NOTE — Telephone Encounter (Signed)
 Copied from CRM [id number]. Topic: Referral - Status >> Jan 11, 2024 10:55 AM [place] A wrote: Reason for CRM: Patient called said referral was not sent he called the office of Dr. Curtistine Downer  I called and spoke with pt, I let pt know referral was sent. Referrals are currently backed up at the moment but Edwina Gram will get to it as soon as she can. Pt verbalized understanding.

## 2024-02-09 ENCOUNTER — Other Ambulatory Visit: Payer: Self-pay | Admitting: Cardiovascular Disease

## 2024-03-01 ENCOUNTER — Other Ambulatory Visit: Payer: Self-pay | Admitting: Cardiovascular Disease

## 2024-03-01 ENCOUNTER — Other Ambulatory Visit: Payer: Self-pay | Admitting: Family Medicine

## 2024-03-02 NOTE — Progress Notes (Signed)
 CARDIOLOGY CONSULT NOTE     Patient ID: [name] MRN: [id number] DOB/AGE: [birth year] 68 y.o.  Admit date: (Not on file) Referring Physician: Kennyth Primary Physician: Kennyth Worth HERO, MD Primary Cardiologist: Delford Reason for Consultation: CAD    HPI: 68 y.o. referred by Dr Kennyth 10/12/20  for family history of CAD, HTN and chronic systolic CHF   History of AML with BMT in 2000.  Diagnosed with MDS in 2020 and had repeat transplant 06/08/19  Post 2nd tranplant had pulmonary edema ? From moderate MR not clear what EF was at that time although notes indicate patient concerned recently that EF had gone done from 50-55% and his HR has been running higher He has seen [facility] cardiology Bab[facility] and there note indicates starting ACE 06/15/20 with plan to add beta blocker and stopped norvasc .    Echo done for heart murmur 09/14/20 showed EF 45-50% GLS -9.6% global hypokinesis with mild MR and normal RV function Indicated no change since echo done 06/11/20 He has been Rx only with lisinopril    We have gotten him on high dose entresto  tolerating well  Cardiac MRI 10/07/21  EF 36%   He has his own business as a Designer, jewellery for textiles. Has a son and daughter working with him All of them went to [employer]. Enjoys golf and has 3 grand kids.  Met his wife of over 40 years at [employer] as well  Retired last June  2024  2 of his children running now  Cardiac CT 12/03/20 calcium  score 120 58 th percentile CAD RADS one non obstructive CAD  He is having a lot of issues with recurrent allergies since his 2 nd transplant Has had recurrent ear infections and in general just feels miserable and anxious about it all Told him he should not stop his beta blocker as his allergist recommended as it is first line Rx for his DCM and if he needed an epi pen for allergic reaction it would still work as intended Started allergy shots and dose increased beginning of August with drop in BP needing epi Allergist felt he  might have had a vagal reaction Seen by our PA 03/12/22 and systolic BP 110 mmHg and GDMT not decreased    Seen in ED 10/11/23 with bradycardia fit bit noted this but was due to PVCls and bigeminny.No chest pain, pre syncope or dyspnea  Pulse deficit responsible perceived low HR  More recently has had w/u for MR cath 11/10/23 with no obstructive CAD PCWP V wave 20 mmHg mean 15 mmHg normal PA pressure. TEE 11/11/23 done by Dr Mona reviewed EF 30-35% mod/severe MR tethered P2, ERO 0.20 cm, Pisa radius 0.7 RV 33 ml.  Seen by Dr Wonda 12/23/23 and since he was NYHA class one and no PV reversals felt MR moderate and not needing intervention.   Allergies improved Still getting shots No dyspnea weights stable Very strict with his sodium. No dyspnea, edema, palpitations or syncope Baseline weight around 164 lbs     ROS All other systems reviewed and negative except as noted above  Past Medical History:  Diagnosis Date   Acute myeloid leukemia in remission (HCC) 1999   Anemia    Arthritis    Depression    Diverticulosis of colon (without mention of hemorrhage)    Diverticulosis of colon (without mention of hemorrhage) 04/21/2011   Esophageal reflux    Family hx colonic polyps    Family hx colonic polyps 04/21/2011   Hiatal  hernia    Hiatal hernia 04/21/2011   History of kidney stones    Hyperlipemia    Hypertension    MDS (myelodysplastic syndrome) (HCC)    Psychosexual dysfunction with inhibited sexual excitement     Family History  Problem Relation Age of Onset   Hypertension Father    Heart disease Father    Prostate cancer Father        dx late 33s   Cancer Father 39       prostate ca   Celiac disease Mother    Colon polyps Mother    Breast cancer Mother    Cancer Mother        Bladder   Colon cancer Neg Hx    Esophageal cancer Neg Hx    Inflammatory bowel disease Neg Hx    Liver disease Neg Hx    Pancreatic cancer Neg Hx    Rectal cancer Neg Hx    Stomach cancer Neg Hx      Social History   Socioeconomic History   Marital status: Married    Spouse name: Not on file   Number of children: 2   Years of education: Not on file   Highest education level: Not on file  Occupational History   Occupation: self employed    Employer: [employer]  Tobacco Use   Smoking status: Never   Smokeless tobacco: Never  Vaping Use   Vaping status: Never Used  Substance and Sexual Activity   Alcohol use: Yes    Comment: one occ    Drug use: No   Sexual activity: Never  Other Topics Concern   Not on file  Social History Narrative   2 caffeine drinks daily    Social Drivers of Health   Financial Resource Strain: Low Risk  (01/20/2023)   Overall Financial Resource Strain (CARDIA)    Difficulty of Paying Living Expenses: Not hard at all  Food Insecurity: No Food Insecurity (11/08/2023)   Hunger Vital Sign    Worried About Running Out of Food in the Last Year: Never true    Ran Out of Food in the Last Year: Never true  Transportation Needs: No Transportation Needs (11/08/2023)   PRAPARE - Administrator, Civil Service (Medical): No    Lack of Transportation (Non-Medical): No  Physical Activity: Sufficiently Active (01/20/2023)   Exercise Vital Sign    Days of Exercise per Week: 7 days    Minutes of Exercise per Session: 40 min  Stress: No Stress Concern Present (01/20/2023)   [name] - Occupational Stress Questionnaire    Feeling of Stress : Not at all  Social Connections: Socially Integrated (11/08/2023)   Social Connection and Isolation Panel    Frequency of Communication with Friends and Family: More than three times a week    Frequency of Social Gatherings with Friends and Family: More than three times a week    Attends Religious Services: More than 4 times per year    Active Member of [employer] or Organizations: Yes    Attends Banker Meetings: More than 4 times per year    Marital Status: Married   Catering manager Violence: Not At Risk (11/08/2023)   Humiliation, Afraid, Rape, and Kick questionnaire    Fear of Current or Ex-Partner: No    Emotionally Abused: No    Physically Abused: No    Sexually Abused: No    Past Surgical History:  Procedure Laterality Date  EAR EXAMINATION UNDER ANESTHESIA Right 10/29/2021   middle ear cleaned out infection, with an inplanted ear tube   KNEE SURGERY Left 08/14/2021   [facility]   RIGHT/LEFT HEART CATH AND CORONARY ANGIOGRAPHY N/A 11/10/2023   Procedure: RIGHT/LEFT HEART CATH AND CORONARY ANGIOGRAPHY;  Surgeon: Verlin Lonni BIRCH, MD;  Location: [facility] INVASIVE CV LAB;  Service: Cardiovascular;  Laterality: N/A;   SHOULDER SURGERY     x 3    TRANSESOPHAGEAL ECHOCARDIOGRAM (CATH LAB) N/A 11/11/2023   Procedure: TRANSESOPHAGEAL ECHOCARDIOGRAM;  Surgeon: Mona Vinie BROCKS, MD;  Location: [facility] INVASIVE CV LAB;  Service: Cardiovascular;  Laterality: N/A;      Current Outpatient Medications:    Ascorbic Acid (VITAMIN C) 500 MG CAPS, Take 500 mg by mouth daily., Disp: , Rfl:    calcium -vitamin D (OSCAL WITH D) 500-200 MG-UNIT TABS tablet, Take 1 tablet by mouth daily., Disp: , Rfl:    cetirizine  (ZYRTEC ) 10 MG tablet, Take 1 tablet (10 mg total) by mouth daily. (Patient taking differently: Take 10 mg by mouth as needed.), Disp: 90 tablet, Rfl: 3   empagliflozin  (JARDIANCE ) 10 MG TABS tablet, Take 1 tablet (10 mg total) by mouth daily., Disp: 90 tablet, Rfl: 2   EPIPEN 2-PAK 0.3 MG/0.3ML DEVI, Inject 0.3 mg into the muscle as needed for anaphylaxis., Disp: , Rfl:    famotidine  (PEPCID ) 20 MG tablet, Take 20 mg by mouth at bedtime., Disp: , Rfl:    fluticasone  (FLONASE ) 50 MCG/ACT nasal spray, Place 1 spray into both nostrils daily., Disp: , Rfl:    furosemide  (LASIX ) 20 MG tablet, TAKE 1 TABLET BY MOUTH DAILY **IN CASE OF WEIGHT GAIN 2 TO 3 LBS IN 24 HOURS OR 5 LBS IN SEVEN DAYS, TAKE 2 TABLETS UNTIL WEIGHT BACK TO BASELINE**, Disp: 90 tablet, Rfl: 0    metoprolol  succinate (TOPROL -XL) 25 MG 24 hr tablet, Take 1.5 tablets (37.5 mg total) by mouth daily., Disp: 135 tablet, Rfl: 2   Nutritional Supplements (JUICE PLUS FIBRE PO), Take by mouth. 2 tabs twice daily, Disp: , Rfl:    omeprazole  (PRILOSEC ) 20 MG capsule, Take 20 mg by mouth 2 (two) times daily before a meal., Disp: , Rfl:    pravastatin  (PRAVACHOL ) 20 MG tablet, Take 1 tablet (20 mg total) by mouth daily., Disp: 90 tablet, Rfl: 2   sacubitril -valsartan  (ENTRESTO ) 24-26 MG, Take 1 tablet by mouth 2 (two) times daily., Disp: 180 tablet, Rfl: 3   sertraline  (ZOLOFT ) 50 MG tablet, TAKE 1 TABLET BY MOUTH DAILY, Disp: 90 tablet, Rfl: 3   sildenafil  (VIAGRA ) 100 MG tablet, TAKE 1/2 TABLET BY MOUTH DAILY AS NEEDED FOR FOR ERECTILE DYSFUNCTION, Disp: 30 tablet, Rfl: 1   triamcinolone  ointment (KENALOG) 0.1 %, Apply 1 Application topically as needed., Disp: , Rfl:    valACYclovir  (VALTREX ) 500 MG tablet, Take 1 tablet (500 mg total) by mouth 2 (two) times daily. As needed for flareups, Disp: 90 tablet, Rfl: 3    Physical Exam: Blood pressure 116/68, pulse 81, height 5' 10 (1.778 m), weight 169 lb 3.2 oz (76.7 kg), SpO2 98%.   Affect appropriate Healthy:  appears stated age HEENT: normal Neck supple with no adenopathy JVP normal no bruits no thyromegaly Lungs clear with no wheezing and good diaphragmatic motion Heart:  S1/S2 no murmur, no rub, gallop or click PMI normal Abdomen: benighn, BS positve, no tenderness, no AAA no bruit.  No HSM or HJR Distal pulses intact with no bruits No edema Neuro non-focal Bilateral knee  arthritis     Labs:   Lab Results  Component Value Date   WBC 4.7 11/11/2023   HGB 12.6 (L) 11/11/2023   HCT 38.3 (L) 11/11/2023   MCV 93.6 11/11/2023   PLT 368 11/11/2023   No results for input(s): NA, K, CL, CO2, BUN, CREATININE, CALCIUM , PROT, BILITOT, ALKPHOS, ALT, AST, GLUCOSE in the last 168 hours.  Invalid input(s):  LABALBU Lab Results  Component Value Date   CKTOTAL 210 03/18/2012   CKMB 3.9 03/18/2012   TROPONINI <0.30 03/18/2012    Lab Results  Component Value Date   CHOL 104 07/23/2023   CHOL 169 01/02/2022   CHOL 140 11/27/2020   Lab Results  Component Value Date   HDL 36.80 (L) 07/23/2023   HDL 52.40 01/02/2022   HDL 44.40 11/27/2020   Lab Results  Component Value Date   LDLCALC 53 07/23/2023   LDLCALC 101 (H) 01/02/2022   LDLCALC 76 11/27/2020   Lab Results  Component Value Date   TRIG 72.0 07/23/2023   TRIG 79.0 01/02/2022   TRIG 96.0 11/27/2020   Lab Results  Component Value Date   CHOLHDL 3 07/23/2023   CHOLHDL 3 01/02/2022   CHOLHDL 3 11/27/2020   Lab Results  Component Value Date   LDLDIRECT 146.2 07/29/2012   LDLDIRECT 136.9 05/12/2011   LDLDIRECT 134.5 01/29/2010      Radiology: No results found.   EKG: 2014 SR LAFB LVH  03/14/2024 NSR LAFB LVH no changes 03/14/2024 SR rate 75 RBBB/LAFB    ASSESSMENT AND PLAN:   1. Family History CAD:  average calcium  score no obstructive dx on CT 12/03/20 and cath 11/10/23  2. HLD:  On statin LDL 101 on pravastatin  20 mg  3. AML/BMT:  Stable post transplant x 2 4. Chronic Systolic CHF: ? Related to Rx for cancer On optimal medical Rx now EF 36% by MRI 10/07/21  He has no volume overload what so ever and do not feel aldactone needed BP stable on current doses  5. Ortho:  bilateral knee issues f/u [facility] post arthroscopic surgery on right and likely will have left done after golf season  6. Allergies:  with immunosuppression from BMT continue shots ?  7. Bifasicular Block:  discussed higher risk of PPM in future ECG stable  8. PVC;s  with bigeminny post RSV will get 3 day monitor to quantify % and update TTE K/Mg ok  9. MR:  functional from LV dysfunction seen by Dr Wonda structural team 12/2023 considered NYHA class 1 and more moderate MR by TEE. Observe TTE 11/2024  Echo 11/2024    F/U in  6 months  Signed: Maude Emmer 03/14/2024, 9:22 AM

## 2024-03-14 ENCOUNTER — Ambulatory Visit: Attending: Cardiovascular Disease | Admitting: Cardiovascular Disease

## 2024-03-14 ENCOUNTER — Encounter: Payer: Self-pay | Admitting: Cardiovascular Disease

## 2024-03-14 ENCOUNTER — Other Ambulatory Visit: Payer: Self-pay

## 2024-03-14 VITALS — BP 116/68 | HR 81 | Ht 70.0 in | Wt 169.2 lb

## 2024-03-14 DIAGNOSIS — I5022 Chronic systolic (congestive) heart failure: Secondary | ICD-10-CM | POA: Diagnosis present

## 2024-03-14 DIAGNOSIS — I428 Other cardiomyopathies: Secondary | ICD-10-CM | POA: Insufficient documentation

## 2024-03-14 DIAGNOSIS — I340 Nonrheumatic mitral (valve) insufficiency: Secondary | ICD-10-CM | POA: Insufficient documentation

## 2024-03-14 DIAGNOSIS — E782 Mixed hyperlipidemia: Secondary | ICD-10-CM

## 2024-03-14 NOTE — Patient Instructions (Addendum)
 Medication Instructions:  Your physician recommends that you continue on your current medications as directed. Please refer to the Current Medication list given to you today.  *If you need a refill on your cardiac medications before your next appointment, please call your pharmacy*  Lab Work: Your physician recommends that you have lab work today- BMET and BNP  If you have labs (blood work) drawn today and your tests are completely normal, you will receive your results only by: MyChart Message (if you have MyChart) OR A paper copy in the mail If you have any lab test that is abnormal or we need to change your treatment, we will call you to review the results.  Testing/Procedures: Your physician has requested that you have an echocardiogram in April 2026. Echocardiography is a painless test that uses sound waves to create images of your heart. It provides your doctor with information about the size and shape of your heart and how well your heart's chambers and valves are working. This procedure takes approximately one hour. There are no restrictions for this procedure. Please do NOT wear cologne, perfume, aftershave, or lotions (deodorant is allowed). Please arrive 15 minutes prior to your appointment time.  Please note: We ask at that you not bring children with you during ultrasound (echo/ vascular) testing. Due to room size and safety concerns, children are not allowed in the ultrasound rooms during exams. Our front office staff cannot provide observation of children in our lobby area while testing is being conducted. An adult accompanying a patient to their appointment will only be allowed in the ultrasound room at the discretion of the ultrasound technician under special circumstances. We apologize for any inconvenience.  Follow-Up: At [facility], you and your health needs are our priority.  As part of our continuing mission to provide you with exceptional heart care, our  providers are all part of one team.  This team includes your primary Cardiologist (physician) and Advanced Practice Providers or APPs (Physician Assistants and Nurse Practitioners) who all work together to provide you with the care you need, when you need it.  Your next appointment:   6 month(s)  Provider:   Maude Emmer, MD    We recommend signing up for the patient portal called MyChart.  Sign up information is provided on this After Visit Summary.  MyChart is used to connect with patients for Virtual Visits (Telemedicine).  Patients are able to view lab/test results, encounter notes, upcoming appointments, etc.  Non-urgent messages can be sent to your provider as well.   To learn more about what you can do with MyChart, go to [email or web address].

## 2024-03-15 ENCOUNTER — Telehealth: Payer: Self-pay | Admitting: Cardiovascular Disease

## 2024-03-15 ENCOUNTER — Ambulatory Visit: Payer: Self-pay | Admitting: Cardiovascular Disease

## 2024-03-15 DIAGNOSIS — R7989 Other specified abnormal findings of blood chemistry: Secondary | ICD-10-CM

## 2024-03-15 DIAGNOSIS — Z79899 Other long term (current) drug therapy: Secondary | ICD-10-CM

## 2024-03-15 DIAGNOSIS — I5022 Chronic systolic (congestive) heart failure: Secondary | ICD-10-CM

## 2024-03-15 LAB — BASIC METABOLIC PANEL WITH GFR
BUN/Creatinine Ratio: 27 — AB (ref 10–24)
BUN: 39 mg/dL — AB (ref 8–27)
CO2: 22 mmol/L (ref 20–29)
Calcium: 9.8 mg/dL (ref 8.6–10.2)
Chloride: 98 mmol/L (ref 96–106)
Creatinine, Ser: 1.44 mg/dL — AB (ref 0.76–1.27)
Glucose: 101 mg/dL — AB (ref 70–99)
Potassium: 5 mmol/L (ref 3.5–5.2)
Sodium: 138 mmol/L (ref 134–144)
eGFR: 53 mL/min/1.73 — AB (ref >59)

## 2024-03-15 LAB — PRO B NATRIURETIC PEPTIDE: NT-Pro BNP: 804 pg/mL — AB (ref 0–376)

## 2024-03-15 MED ORDER — FUROSEMIDE 20 MG PO TABS: 10.0000 mg | ORAL_TABLET | ORAL | 3 refills | Status: DC

## 2024-03-15 NOTE — Telephone Encounter (Signed)
 Left the information below over voicemail along with call back number.   Delford Maude BROCKS, MD to Drena Martinis, Shar[place], RN     03/15/24  8:30 AM Result Note BNP up CRF would start lasix  10 mg every other day and repeat labs in 8 weeks Pro b natriuretic peptide (BNP); Basic metabolic panel with GFR  Sent in RX for Lasix  and placed orders and released-BNP and BMP.

## 2024-03-15 NOTE — Telephone Encounter (Signed)
 Patient would like Dr. Delford to call him back to discuss in detail his lab results.

## 2024-03-15 NOTE — Telephone Encounter (Signed)
 Pt was returning nurse call regarding results and is requesting a callback. He was unable to hold any longer due to him having to take another call so he decided to wait for a callback. Please advise

## 2024-03-17 ENCOUNTER — Encounter: Payer: Self-pay | Admitting: Cardiovascular Disease

## 2024-03-23 ENCOUNTER — Encounter: Payer: Self-pay | Admitting: Family Medicine

## 2024-03-24 NOTE — Telephone Encounter (Signed)
**Note De-identified  [place] Obfuscation** Please advise 

## 2024-03-24 NOTE — Telephone Encounter (Signed)
 Ok to send this in if it is still needed.  Worth HERO. Kennyth, MD 03/24/2024 4:25 PM

## 2024-03-28 NOTE — Telephone Encounter (Signed)
 [place]

## 2024-04-18 ENCOUNTER — Ambulatory Visit

## 2024-04-24 ENCOUNTER — Other Ambulatory Visit: Payer: Self-pay | Admitting: Cardiovascular Disease

## 2024-04-24 DIAGNOSIS — I5022 Chronic systolic (congestive) heart failure: Secondary | ICD-10-CM

## 2024-05-11 ENCOUNTER — Ambulatory Visit: Admitting: Family Medicine

## 2024-05-11 ENCOUNTER — Encounter: Payer: Self-pay | Admitting: Family Medicine

## 2024-05-11 VITALS — BP 112/80 | HR 68 | Temp 97.3°F | Ht 70.0 in | Wt 170.8 lb

## 2024-05-11 DIAGNOSIS — I10 Essential (primary) hypertension: Secondary | ICD-10-CM

## 2024-05-11 DIAGNOSIS — I5020 Unspecified systolic (congestive) heart failure: Secondary | ICD-10-CM

## 2024-05-11 DIAGNOSIS — Z23 Encounter for immunization: Secondary | ICD-10-CM

## 2024-05-11 DIAGNOSIS — H9202 Otalgia, left ear: Secondary | ICD-10-CM | POA: Diagnosis not present

## 2024-05-11 NOTE — Progress Notes (Signed)
   [name] is a 68 y.o. male who presents today for an office visit.  Assessment/Plan:  New/Acute Problems: Left Ear Pain  Pain has improved however his left tympanostomy tube has begun to extrude from his TM.  Advised him to follow-up with ENT to discuss need for removal and/or replacement.  We did discuss been on treatment for eustachian tube dysfunction however he does not feel this is necessary at this point.  Will defer further management to ENT.  Chronic Problems Addressed Today: Essential hypertension, benign Blood pressure at goal today on a regimen per cardiology with metoprolol  succinate 37.5 mg daily and Entresto  24-26 twice daily.  HFrEF (heart failure with reduced ejection fraction) (HCC) Euvolemic today.  Following with cardiology.  He is currently on Entresto  24-26 daily, Toprol  succinate 37.5 mg daily, Jardiance  2 mg daily and Lasix  20 mg daily as needed.  He has been monitoring his weights routinely.    Preventative Healthcare -Flu vaccine given today.    Subjective:  HPI:  See assessment / plan for status of chronic conditions.   Discussed the use of AI scribe software for clinical note transcription with the patient, who gave verbal consent to proceed.  History of Present Illness [name] is a 68 year old male who presents with left ear pain.  He experiences significant pain in his left ear, which began during a recent trip to higher elevations in [place]  and [place] . The pain was particularly intense when he blew his nose, suspecting that it may have caused his ear tube to come loose. He has not experienced any drainage, but describes the ear as feeling dry. The pain has since decreased, but he is concerned about the tube working its way out.  He has a history of congestive heart failure, for which he was hospitalized earlier this year. He was initially prescribed Lasix  20 mg daily, but his cardiologist recently reduced the dosage to half a  tablet every other day. He monitors his weight daily, using 166 pounds as his baseline, and has been instructed to adjust his Lasix  intake if his weight increases significantly. Traveling, particularly flying, tends to cause fluid retention, which he manages by monitoring his weight and leg swelling. No current shortness of breath or cough.  He underwent a bone marrow transplant five years ago and is approaching his anniversary. He mentions that he may need a mitral valve replacement in the future, but currently remains asymptomatic.  He recalls a previous significant ear infection that led to mastoiditis, requiring surgical intervention to clean out the infection and the placement of ear tubes to prevent recurrence.         Objective:  Physical Exam: BP 112/80   Pulse 68   Temp (!) 97.3 F (36.3 C) (Temporal)   Ht 5' 10 (1.778 m)   Wt 170 lb 12.8 oz (77.5 kg)   SpO2 99%   BMI 24.51 kg/m   Gen: No acute distress, resting comfortably HEENT: Left tympanostomy tube extruding from TM.  No signs of infection.  Right TM clear with tympanostomy tube in place. CV: Regular rate and rhythm with no murmurs appreciated Pulm: Normal work of breathing, clear to auscultation bilaterally with no crackles, wheezes, or rhonchi Neuro: Grossly normal, moves all extremities Psych: Normal affect and thought content      Myrikal Messmer M. Kennyth, MD 05/11/2024 10:07 AM

## 2024-05-11 NOTE — Assessment & Plan Note (Signed)
 Blood pressure at goal today on a regimen per cardiology with metoprolol  succinate 37.5 mg daily and Entresto  24-26 twice daily.

## 2024-05-11 NOTE — Patient Instructions (Addendum)
 It was very nice to see you today!  VISIT SUMMARY: During your visit, we discussed your left ear pain, heart failure management, mitral valve disorder, and hypertension. We reviewed your current treatments and made some recommendations for follow-up care.  YOUR PLAN: LEFT EAR PAIN: Your left ear tympanostomy tube is coming out, but there is no infection or eardrum rupture. You have mild pain when blowing your nose. -Contact ENT specialist Dr. Sherill to discuss the tube extrusion and potential replacement. -Consider using nasal sprays or decongestants if your symptoms worsen.  HEART FAILURE WITH REDUCED EJECTION FRACTION (HFREF): Your heart failure is being managed with a reduced dose of Lasix . You are monitoring your weight and fluid retention. -Continue taking Lasix  10 mg every other day. -Monitor your daily weight and adjust Lasix  if you notice significant weight gain. -Watch for signs of fluid retention, such as leg swelling or shortness of breath.  MITRAL VALVE DISORDER: You are currently asymptomatic but may need a valve replacement in the future. -Monitor for any symptoms that may indicate worsening of your mitral valve disorder. -Discuss advancements in valve replacement techniques with your cardiologist if symptoms develop.  ESSENTIAL HYPERTENSION: Your blood pressure is well-controlled with your current medication. -Continue your current antihypertensive medication regimen.  Return if symptoms worsen or fail to improve.   Take care, Dr Kennyth  PLEASE NOTE:  If you had any lab tests, please let us  know if you have not heard back within a few days. You may see your results on mychart before we have a chance to review them but we will give you a call once they are reviewed by us .   If we ordered any referrals today, please let us  know if you have not heard from their office within the next week.   If you had any urgent prescriptions sent in today, please check with the pharmacy  within an hour of our visit to make sure the prescription was transmitted appropriately.   Please try these tips to maintain a healthy lifestyle:  Eat at least 3 REAL meals and 1-2 snacks per day.  Aim for no more than 5 hours between eating.  If you eat breakfast, please do so within one hour of getting up.   Each meal should contain half fruits/vegetables, one quarter protein, and one quarter carbs (no bigger than a computer mouse)  Cut down on sweet beverages. This includes juice, soda, and sweet tea.   Drink at least 1 glass of water with each meal and aim for at least 8 glasses per day  Exercise at least 150 minutes every week.

## 2024-05-11 NOTE — Assessment & Plan Note (Signed)
 Euvolemic today.  Following with cardiology.  He is currently on Entresto  24-26 daily, Toprol  succinate 37.5 mg daily, Jardiance  2 mg daily and Lasix  20 mg daily as needed.  He has been monitoring his weights routinely.

## 2024-05-19 ENCOUNTER — Ambulatory Visit: Payer: Self-pay

## 2024-05-19 LAB — BASIC METABOLIC PANEL WITH GFR
BUN/Creatinine Ratio: 19 (ref 10–24)
BUN: 29 mg/dL — ABNORMAL HIGH (ref 8–27)
CO2: 21 mmol/L (ref 20–29)
Calcium: 9.4 mg/dL (ref 8.6–10.2)
Chloride: 101 mmol/L (ref 96–106)
Creatinine, Ser: 1.52 mg/dL — ABNORMAL HIGH (ref 0.76–1.27)
Glucose: 115 mg/dL — ABNORMAL HIGH (ref 70–99)
Potassium: 4.5 mmol/L (ref 3.5–5.2)
Sodium: 138 mmol/L (ref 134–144)
eGFR: 50 mL/min/1.73 — ABNORMAL LOW (ref >59)

## 2024-05-19 LAB — BRAIN NATRIURETIC PEPTIDE: BNP: 116.2 pg/mL — ABNORMAL HIGH (ref 0.0–100.0)

## 2024-05-20 NOTE — Progress Notes (Signed)
 The patient has been notified of the result and verbalized understanding. All questions (if any) were answered.   Patient also went ahead and made a 6 month f/u with Dr. Delford on 09/26/24 @ 9:00

## 2024-06-07 ENCOUNTER — Ambulatory Visit

## 2024-06-09 ENCOUNTER — Ambulatory Visit (INDEPENDENT_AMBULATORY_CARE_PROVIDER_SITE_OTHER): Admitting: Family Medicine

## 2024-06-09 ENCOUNTER — Encounter: Payer: Self-pay | Admitting: Family Medicine

## 2024-06-09 VITALS — BP 118/72 | HR 75 | Temp 97.4°F | Ht 70.0 in | Wt 173.2 lb

## 2024-06-09 DIAGNOSIS — J329 Chronic sinusitis, unspecified: Secondary | ICD-10-CM | POA: Diagnosis not present

## 2024-06-09 DIAGNOSIS — I10 Essential (primary) hypertension: Secondary | ICD-10-CM | POA: Diagnosis not present

## 2024-06-09 MED ORDER — AZITHROMYCIN 250 MG PO TABS: ORAL_TABLET | ORAL | 0 refills | Status: DC

## 2024-06-09 MED ORDER — PREDNISONE 20 MG PO TABS: 20.0000 mg | ORAL_TABLET | Freq: Every day | ORAL | 0 refills | Status: DC

## 2024-06-09 NOTE — Progress Notes (Signed)
   [name] is a 68 y.o. male who presents today for an office visit.  Assessment/Plan:  Chronic Problems Addressed Today: Recurrent sinusitis Symptoms are consistent with recurrent sinusitis.  He has not tolerated Augmentin  in the past.  Will give azithromycin .  Also give low-dose prednisone  for 5 days.  He has tolerated this well in the past.  Encouraged hydration.  He can use over-the-counter meds as needed.  He will let us  know if not improving in the next week or so.  Essential hypertension, benign At goal today on regimen per cardiology with metoprolol  succinate 37.5 mg daily and Entresto  24-26 twice daily.     Subjective:  HPI:  See assessment / plan for status of chronic conditions.   Discussed the use of AI scribe software for clinical note transcription with the patient, who gave verbal consent to proceed.  History of Present Illness [name] is a 68 year old male with a history of frequent sinus infections who presents with symptoms suggestive of a sinus infection.  He developed cold symptoms on October 20th, approximately ten days ago. Initially, he was able to maintain his usual activities, including playing golf. However, in the last two to three days, his symptoms have worsened, and he describes feeling 'locked up' with a headache and dental pain. He notes a significant amount of nasal congestion and drainage, which has now decreased, but his head and teeth hurt.  He has a history of frequent sinus infections, particularly following colds, but notes that the frequency has decreased over the years. The last sinus infection was approximately a year ago. He has been using Flonase , with two sprays in each nostril in the morning and evening during colds. Despite this, he has developed a sinus infection with his current cold.  He mentions a history of a bone marrow transplant five years ago and has been attending a survivorship clinic. He also had significant  ear surgery a few years ago and continues to follow up with his ENT every six months. Recently, his ENT removed a tube from his ear, and he decided not to replace it, opting to monitor his condition instead.  He has experienced weight loss of about four pounds, which he attributes to the cold. He has previously used antibiotics like Augmentin  and Z-Pak for sinus infections, but reports that Augmentin  now causes severe diarrhea. He has tolerated Z-Pak well in the past.         Objective:  Physical Exam: BP 118/72   Pulse 75   Temp (!) 97.4 F (36.3 C) (Temporal)   Ht 5' 10 (1.778 m)   Wt 173 lb 3.2 oz (78.6 kg)   SpO2 98%   BMI 24.85 kg/m   Gen: No acute distress, resting comfortably HEENT: Left TM clear.  Right TM with ostomy tube in place. CV: Regular rate and rhythm with no murmurs appreciated Pulm: Normal work of breathing, clear to auscultation bilaterally with no crackles, wheezes, or rhonchi Neuro: Grossly normal, moves all extremities Psych: Normal affect and thought content      Melvin Barnier M. Kennyth, MD 06/09/2024 11:40 AM

## 2024-06-09 NOTE — Assessment & Plan Note (Signed)
 Symptoms are consistent with recurrent sinusitis.  He has not tolerated Augmentin  in the past.  Will give azithromycin .  Also give low-dose prednisone  for 5 days.  He has tolerated this well in the past.  Encouraged hydration.  He can use over-the-counter meds as needed.  He will let us  know if not improving in the next week or so.

## 2024-06-09 NOTE — Assessment & Plan Note (Signed)
 At goal today on regimen per cardiology with metoprolol  succinate 37.5 mg daily and Entresto  24-26 twice daily.

## 2024-06-09 NOTE — Patient Instructions (Addendum)
 It was very nice to see you today!  VISIT SUMMARY: Today, you were seen for symptoms of a sinus infection that started about ten days ago. Your symptoms have worsened recently, including nasal congestion, headache, dental pain, and a cough.  YOUR PLAN: ACUTE SINUSITIS: You have a sinus infection likely due to a recent cold, with symptoms including nasal congestion, headache, dental pain, and a cough. -Start taking azithromycin  (Z-Pak) as prescribed. This antibiotic has worked well for you in the past and should help clear the infection. -Take prednisone  as prescribed to help reduce inflammation and congestion. -Continue using Flonase  nasal spray as directed by your ENT specialist. -Contact us  if you do not see any improvement in your symptoms.  Return if symptoms worsen or fail to improve.   Take care, Dr Kennyth  PLEASE NOTE:  If you had any lab tests, please let us  know if you have not heard back within a few days. You may see your results on mychart before we have a chance to review them but we will give you a call once they are reviewed by us .   If we ordered any referrals today, please let us  know if you have not heard from their office within the next week.   If you had any urgent prescriptions sent in today, please check with the pharmacy within an hour of our visit to make sure the prescription was transmitted appropriately.   Please try these tips to maintain a healthy lifestyle:  Eat at least 3 REAL meals and 1-2 snacks per day.  Aim for no more than 5 hours between eating.  If you eat breakfast, please do so within one hour of getting up.   Each meal should contain half fruits/vegetables, one quarter protein, and one quarter carbs (no bigger than a computer mouse)  Cut down on sweet beverages. This includes juice, soda, and sweet tea.   Drink at least 1 glass of water with each meal and aim for at least 8 glasses per day  Exercise at least 150 minutes every week.

## 2024-06-21 ENCOUNTER — Ambulatory Visit

## 2024-06-21 VITALS — BP 120/80 | HR 67 | Temp 97.9°F | Ht 70.0 in | Wt 171.6 lb

## 2024-06-21 DIAGNOSIS — Z0000 Encounter for general adult medical examination without abnormal findings: Secondary | ICD-10-CM

## 2024-06-21 NOTE — Progress Notes (Signed)
 Subjective:   [name] is a 68 y.o. male who presents for a Medicare Annual Wellness Visit.  Allergies (verified) Morphine and Marinol [dronabinol]   History: Past Medical History:  Diagnosis Date   Acute myeloid leukemia in remission (HCC) 1999   Anemia    Arthritis    Depression    Diverticulosis of colon (without mention of hemorrhage)    Diverticulosis of colon (without mention of hemorrhage) 04/21/2011   Esophageal reflux    Family hx colonic polyps    Family hx colonic polyps 04/21/2011   Hiatal hernia    Hiatal hernia 04/21/2011   History of kidney stones    Hyperlipemia    Hypertension    MDS (myelodysplastic syndrome) (HCC)    Psychosexual dysfunction with inhibited sexual excitement    Past Surgical History:  Procedure Laterality Date   EAR EXAMINATION UNDER ANESTHESIA Right 10/29/2021   middle ear cleaned out infection, with an inplanted ear tube   KNEE SURGERY Left 08/14/2021   [facility]   RIGHT/LEFT HEART CATH AND CORONARY ANGIOGRAPHY N/A 11/10/2023   Procedure: RIGHT/LEFT HEART CATH AND CORONARY ANGIOGRAPHY;  Surgeon: Verlin Lonni BIRCH, MD;  Location: [facility];  Service: Cardiovascular;  Laterality: N/A;   SHOULDER SURGERY     x 3    TRANSESOPHAGEAL ECHOCARDIOGRAM (CATH LAB) N/A 11/11/2023   Procedure: TRANSESOPHAGEAL ECHOCARDIOGRAM;  Surgeon: Mona Vinie BROCKS, MD;  Location: [facility];  Service: Cardiovascular;  Laterality: N/A;   Family History  Problem Relation Age of Onset   Hypertension Father    Heart disease Father    Prostate cancer Father        dx late 99s   Cancer Father 42       prostate ca   Celiac disease Mother    Colon polyps Mother    Breast cancer Mother    Cancer Mother        Bladder   Colon cancer Neg Hx    Esophageal cancer Neg Hx    Inflammatory bowel disease Neg Hx    Liver disease Neg Hx    Pancreatic cancer Neg Hx    Rectal cancer Neg Hx    Stomach cancer Neg Hx    Social History    Occupational History   Occupation: self employed    Employer: [employer]  Tobacco Use   Smoking status: Never   Smokeless tobacco: Never  Vaping Use   Vaping status: Never Used  Substance and Sexual Activity   Alcohol use: Yes    Comment: one occ    Drug use: No   Sexual activity: Never   Tobacco Counseling Counseling given: Not Answered  SDOH Screenings   Food Insecurity: No Food Insecurity (06/21/2024)  Housing: Unknown (06/21/2024)  Transportation Needs: No Transportation Needs (06/21/2024)  Utilities: Not At Risk (06/21/2024)  Depression (PHQ2-9): Low Risk  (06/21/2024)  Financial Resource Strain: Low Risk  (01/20/2023)  Physical Activity: Sufficiently Active (06/21/2024)  Social Connections: Socially Integrated (06/21/2024)  Stress: No Stress Concern Present (06/21/2024)  Tobacco Use: Low Risk  (06/21/2024)  Health Literacy: Adequate Health Literacy (06/21/2024)   Depression Screen    06/21/2024   12:15 PM 06/09/2024   11:16 AM 05/11/2024    9:37 AM 12/21/2023    7:50 AM 07/23/2023    8:46 AM 05/06/2023    7:40 AM 01/20/2023    2:08 PM  PHQ 2/9 Scores  PHQ - 2 Score 0 0 0 0 0 0 0  Goals Addressed               This Visit's Progress     low sodium diet maintaing weight (pt-stated)        Low sodium diet maintaining weight        Visit info / Clinical Intake: Medicare Wellness Visit Type:: Subsequent Annual Wellness Visit Persons participating in visit:: patient Medicare Wellness Visit Mode:: In-person (required for WTM) Information given by:: patient Interpreter Needed?: No Pre-visit prep was completed: yes AWV questionnaire completed by patient prior to visit?: no Living arrangements:: lives with spouse/significant other Patient's Overall Health Status Rating: excellent Typical amount of pain: none Does pain affect daily life?: no Are you currently prescribed opioids?: no  Dietary Habits and Nutritional Risks How many meals a day?:  3 Eats fruit and vegetables daily?: yes Most meals are obtained by: preparing own meals; eating out Diabetic:: no  Functional Status Activities of Daily Living (to include ambulation/medication): Independent Ambulation: Independent with device- listed below Home Assistive Devices/Equipment: Eyeglasses Medication Administration: Independent Home Management: Independent Manage your own finances?: yes Primary transportation is: driving Concerns about vision?: no *vision screening is required for WTM* Concerns about hearing?: no  Fall Screening Falls in the past year?: 0 Number of falls in past year: 0 Was there an injury with Fall?: 0 Fall Risk Category Calculator: 0 Patient Fall Risk Level: Low Fall Risk  Fall Risk Patient at Risk for Falls Due to: No Fall Risks Fall risk Follow up: Falls prevention discussed  Home and Transportation Safety: All rugs have non-skid backing?: (!) no All stairs or steps have railings?: yes Grab bars in the bathtub or shower?: yes Have non-skid surface in bathtub or shower?: yes Good home lighting?: yes Regular seat belt use?: yes Hospital stays in the last year:: (!) yes How many hospital stays:: 1 Reason: chf  Cognitive Assessment Difficulty concentrating, remembering, or making decisions? : no Will 6CIT or Mini Cog be Completed: no 6CIT or Mini Cog Declined: patient alert, oriented, able to answer questions appropriately and recall recent events  Advance Directives (For Healthcare) Does Patient Have a Medical Advance Directive?: Yes Does patient want to make changes to medical advance directive?: No - Patient declined Type of Advance Directive: Healthcare Power of Attorney Copy of Healthcare Power of Attorney in Chart?: No - copy requested Copy of Living Will in Chart?: No - copy requested Would patient like information on creating a medical advance directive?: No - Patient declined  Reviewed/Updated  Reviewed/Updated: Reviewed All  (Medical, Surgical, Family, Medications, Allergies, Care Teams, Patient Goals)        Objective:    Today's Vitals   06/21/24 1203  BP: 120/80  Pulse: 67  Temp: 97.9 F (36.6 C)  SpO2: 97%  Weight: 171 lb 9.6 oz (77.8 kg)  Height: 5' 10 (1.778 m)   Body mass index is 24.62 kg/m.  Current Medications (verified) Outpatient Encounter Medications as of 06/21/2024  Medication Sig   Ascorbic Acid (VITAMIN C) 500 MG CAPS Take 500 mg by mouth daily.   calcium -vitamin D (OSCAL WITH D) 500-200 MG-UNIT TABS tablet Take 1 tablet by mouth daily.   cetirizine  (ZYRTEC ) 10 MG tablet Take 1 tablet (10 mg total) by mouth daily.   empagliflozin  (JARDIANCE ) 10 MG TABS tablet Take 1 tablet (10 mg total) by mouth daily.   EPIPEN 2-PAK 0.3 MG/0.3ML DEVI Inject 0.3 mg into the muscle as needed for anaphylaxis.   famotidine  (PEPCID ) 20 MG tablet Take 20  mg by mouth at bedtime.   fluticasone  (FLONASE ) 50 MCG/ACT nasal spray Place 1 spray into both nostrils daily.   furosemide  (LASIX ) 20 MG tablet TAKE 1 TABLET BY MOUTH DAILY **IN CASE OF WEIGHT GAIN 2 TO 3 LBS IN 24 HOURS OR 5 LBS IN SEVEN DAYS, TAKE 2 TABLETS UNTIL WEIGHT BACK TO BASELINE** (Patient taking differently: 10 mg. 1/2 a tab every other day)   metoprolol  succinate (TOPROL -XL) 25 MG 24 hr tablet Take 1.5 tablets (37.5 mg total) by mouth daily.   Nutritional Supplements (JUICE PLUS FIBRE PO) Take by mouth. 2 tabs twice daily   omeprazole  (PRILOSEC ) 20 MG capsule Take 20 mg by mouth 2 (two) times daily before a meal.   pravastatin  (PRAVACHOL ) 20 MG tablet Take 1 tablet (20 mg total) by mouth daily.   sacubitril -valsartan  (ENTRESTO ) 24-26 MG Take 1 tablet by mouth 2 (two) times daily.   sertraline  (ZOLOFT ) 50 MG tablet TAKE 1 TABLET BY MOUTH DAILY   sildenafil  (VIAGRA ) 100 MG tablet TAKE 1/2 TABLET BY MOUTH DAILY AS NEEDED FOR FOR ERECTILE DYSFUNCTION   triamcinolone  ointment (KENALOG) 0.1 % Apply 1 Application topically as needed.    valACYclovir  (VALTREX ) 500 MG tablet Take 1 tablet (500 mg total) by mouth 2 (two) times daily. As needed for flareups   [DISCONTINUED] azithromycin  (ZITHROMAX ) 250 MG tablet Take 2 tabs day 1, then 1 tab daily   [DISCONTINUED] predniSONE  (DELTASONE ) 20 MG tablet Take 1 tablet (20 mg total) by mouth daily with breakfast.   No facility-administered encounter medications on file as of 06/21/2024.   Hearing/Vision screen Hearing Screening - Comments:: Pt denies any hearing issues  Vision Screening - Comments:: Wears rx glasses - up to date with routine eye exams with Dr Ginnie pinal  Immunizations and Health Maintenance Health Maintenance  Topic Date Due   COVID-19 Vaccine (11 - 2025-26 season) 06/25/2024 (Originally 04/11/2024)   DEXA SCAN  07/01/2024   Medicare Annual Wellness (AWV)  06/21/2025   DTaP/Tdap/Td (7 - Td or Tdap) 04/30/2030   Colonoscopy  10/02/2031   Pneumococcal Vaccine: 50+ Years  Completed   Influenza Vaccine  Completed   Hepatitis C Screening  Completed   Zoster Vaccines- Shingrix   Completed   Meningococcal B Vaccine  Aged Out   Hepatitis B Vaccines 19-59 Average Risk  Discontinued        Assessment/Plan:  This is a routine wellness examination for [place].  Patient Care Team: Kennyth Worth HERO, MD as PCP - General (Family Medicine) Delford Maude BROCKS, MD as PCP - Cardiology (Cardiology)  I have personally reviewed and noted the following in the patient's chart:   Medical and social history Use of alcohol, tobacco or illicit drugs  Current medications and supplements including opioid prescriptions. Functional ability and status Nutritional status Physical activity Advanced directives List of other physicians Hospitalizations, surgeries, and ER visits in previous 12 months Vitals Screenings to include cognitive, depression, and falls Referrals and appointments  No orders of the defined types were placed in this encounter.  In addition, I have reviewed and  discussed with patient certain preventive protocols, quality metrics, and best practice recommendations. A written personalized care plan for preventive services as well as general preventive health recommendations were provided to patient.   Ellouise VEAR Haws, LPN   88/88/7974   Return in 1 year (on 06/21/2025).  After Visit Summary: (In Person-Printed) AVS printed and given to the patient  Nurse Notes: nothing significant at this time

## 2024-06-21 NOTE — Patient Instructions (Signed)
 [name],  Thank you for taking the time for your Medicare Wellness Visit. I appreciate your continued commitment to your health goals. Please review the care plan we discussed, and feel free to reach out if I can assist you further.  Please note that Annual Wellness Visits do not include a physical exam. Some assessments may be limited, especially if the visit was conducted virtually. If needed, we may recommend an in-person follow-up with your provider.  Ongoing Care Seeing your primary care provider every 3 to 6 months helps us  monitor your health and provide consistent, personalized care.   Referrals If a referral was made during today's visit and you haven't received any updates within two weeks, please contact the referred provider directly to check on the status.  Recommended Screenings:  Health Maintenance  Topic Date Due   COVID-19 Vaccine (11 - 2025-26 season) 06/25/2024*   DEXA scan (bone density measurement)  07/01/2024   Medicare Annual Wellness Visit  06/21/2025   DTaP/Tdap/Td vaccine (7 - Td or Tdap) 04/30/2030   Colon Cancer Screening  10/02/2031   Pneumococcal Vaccine for age over 60  Completed   Flu Shot  Completed   Hepatitis C Screening  Completed   Zoster (Shingles) Vaccine  Completed   Meningitis B Vaccine  Aged Out   Hepatitis B Vaccine  Discontinued  *Topic was postponed. The date shown is not the original due date.       06/21/2024   12:11 PM  Advanced Directives  Does Patient Have a Medical Advance Directive? Yes  Type of Advance Directive Healthcare Power of Attorney  Copy of Healthcare Power of Attorney in Chart? No - copy requested    Vision: Annual vision screenings are recommended for early detection of glaucoma, cataracts, and diabetic retinopathy. These exams can also reveal signs of chronic conditions such as diabetes and high blood pressure.  Dental: Annual dental screenings help detect early signs of oral cancer, gum disease, and other  conditions linked to overall health, including heart disease and diabetes.  Please see the attached documents for additional preventive care recommendations.

## 2024-07-01 ENCOUNTER — Other Ambulatory Visit: Payer: Self-pay

## 2024-07-01 ENCOUNTER — Telehealth: Payer: Self-pay

## 2024-07-01 DIAGNOSIS — J329 Chronic sinusitis, unspecified: Secondary | ICD-10-CM

## 2024-07-01 MED ORDER — AZITHROMYCIN 250 MG PO TABS: ORAL_TABLET | ORAL | 0 refills | Status: AC

## 2024-07-01 MED ORDER — PREDNISONE 20 MG PO TABS: 20.0000 mg | ORAL_TABLET | Freq: Every day | ORAL | 0 refills | Status: DC

## 2024-07-01 NOTE — Telephone Encounter (Signed)
 Copied from CRM [id number]. Topic: Clinical - Medication Question >> Jul 01, 2024  9:56 AM Tinnie C wrote: Reason for CRM: Patient has sinus infection while out of town, states he gets them often because of bone marrow transplant and Dr. Kennyth generally prescribes a z pack and 5 days of prednisone  at 20mg . He is wondering if this can be called into:  [id number] [place], [place] - [address] [place] [address] Phone: [phone] Fax: [phone]  He says you can message him through [place].

## 2024-07-01 NOTE — Telephone Encounter (Signed)
 Ok to send in. Please have him follow up here if not improving.  Worth HERO. Kennyth, MD 07/01/2024 1:06 PM

## 2024-07-05 ENCOUNTER — Telehealth (HOSPITAL_BASED_OUTPATIENT_CLINIC_OR_DEPARTMENT_OTHER): Payer: Self-pay | Admitting: *Deleted

## 2024-07-05 ENCOUNTER — Telehealth: Payer: Self-pay | Admitting: Cardiovascular Disease

## 2024-07-05 NOTE — Telephone Encounter (Signed)
   Name: [name]  DOB: 08/19/1955  MRN: [id number]  Primary Cardiologist: Maude Emmer, MD Last OV 03/14/24 with Dr. Emmer   Preoperative team, please contact this patient and set up a phone call appointment for further preoperative risk assessment. Please obtain consent and complete medication review. Thank you for your help.  I confirm that guidance regarding antiplatelet and oral anticoagulation therapy has been completed and, if necessary, noted below. None requested.   I also confirmed the patient resides in the state of [place] . As per [facility] Medical Board telemedicine laws, the patient must reside in the state in which the provider is licensed.  Gentry Pilson D Torra Pala, NP 07/05/2024, 10:01 AM  [facility]

## 2024-07-05 NOTE — Telephone Encounter (Signed)
   Pre-operative Risk Assessment    Patient Name: [name]  DOB: 01/22/1956 MRN: [id number]   Date of last office visit: 03/14/24 DR. NISHAN Date of next office visit: 09/26/24 DR. NISHAN   Request for Surgical Clearance    Procedure:  LEFT THUMB CMC ARTHROPLASTY WITH PARALLEL SUSPENSION AND TENDON TRANSFER AND REPAIR RECONSTRUCTION   Date of Surgery:  Clearance TBD                                Surgeon:  DR. ELSIE MUSSEL Surgeon's Group or Practice Name:  [facility] Phone number:  [phone] Fax number:  [phone] [facility] WILLS   Type of Clearance Requested:   - Medical    Type of Anesthesia:  BLOCK WITH IV SEDATION   Additional requests/questions:    Bonney Niels Jest   07/05/2024, 9:26 AM

## 2024-07-05 NOTE — Telephone Encounter (Signed)
 Pt has been scheduled tele preop appt 07/14/24. Pt states surgery was supposed to be 07/11/24 but they had to change it due to needing cardiac clearance. Surgeon waiting on clearance before scheduling procedure.    Med rec and consent are done.

## 2024-07-05 NOTE — Telephone Encounter (Signed)
 Pt has been scheduled tele preop appt 07/14/24. Pt states surgery was supposed to be 07/11/24 but they had to change it due to needing cardiac clearance. Surgeon waiting on clearance before scheduling procedure.    Med rec and consent are done.      Patient Consent for Virtual Visit        [name] has provided verbal consent on 07/05/2024 for a virtual visit (video or telephone).   CONSENT FOR VIRTUAL VISIT FOR:  [name]  By participating in this virtual visit I agree to the following:  I hereby voluntarily request, consent and authorize [facility] and its employed or contracted physicians, physician assistants, nurse practitioners or other licensed health care professionals (the Practitioner), to provide me with telemedicine health care services (the "Services) as deemed necessary by the treating Practitioner. I acknowledge and consent to receive the Services by the Practitioner via telemedicine. I understand that the telemedicine visit will involve communicating with the Practitioner through live audiovisual communication technology and the disclosure of certain medical information by electronic transmission. I acknowledge that I have been given the opportunity to request an in-person assessment or other available alternative prior to the telemedicine visit and am voluntarily participating in the telemedicine visit.  I understand that I have the right to withhold or withdraw my consent to the use of telemedicine in the course of my care at any time, without affecting my right to future care or treatment, and that the Practitioner or I may terminate the telemedicine visit at any time. I understand that I have the right to inspect all information obtained and/or recorded in the course of the telemedicine visit and may receive copies of available information for a reasonable fee.  I understand that some of the potential risks of receiving the Services via telemedicine  include:  Delay or interruption in medical evaluation due to technological equipment failure or disruption; Information transmitted may not be sufficient (e.g. poor resolution of images) to allow for appropriate medical decision making by the Practitioner; and/or  In rare instances, security protocols could fail, causing a breach of personal health information.  Furthermore, I acknowledge that it is my responsibility to provide information about my medical history, conditions and care that is complete and accurate to the best of my ability. I acknowledge that Practitioner's advice, recommendations, and/or decision may be based on factors not within their control, such as incomplete or inaccurate data provided by me or distortions of diagnostic images or specimens that may result from electronic transmissions. I understand that the practice of medicine is not an exact science and that Practitioner makes no warranties or guarantees regarding treatment outcomes. I acknowledge that a copy of this consent can be made available to me via my patient portal [facility] MyChart), or I can request a printed copy by calling the office of [facility].    I understand that my insurance will be billed for this visit.   I have read or had this consent read to me. I understand the contents of this consent, which adequately explains the benefits and risks of the Services being provided via telemedicine.  I have been provided ample opportunity to ask questions regarding this consent and the Services and have had my questions answered to my satisfaction. I give my informed consent for the services to be provided through the use of telemedicine in my medical care

## 2024-07-05 NOTE — Telephone Encounter (Signed)
 Patient called to report his surgeon's office has faxed a clearance request for him to have surgery on his thumb.

## 2024-07-14 ENCOUNTER — Ambulatory Visit: Attending: Cardiology | Admitting: Nurse Practitioner

## 2024-07-14 DIAGNOSIS — Z01810 Encounter for preprocedural cardiovascular examination: Secondary | ICD-10-CM | POA: Insufficient documentation

## 2024-07-14 NOTE — Progress Notes (Signed)
 Virtual Visit via Telephone Note   Because of [name] co-morbid illnesses, he is at least at moderate risk for complications without adequate follow up.  This format is felt to be most appropriate for this patient at this time.  Due to technical limitations with video connection (technology), today's appointment will be conducted as an audio only telehealth visit, and [name] verbally agreed to proceed in this manner.   All issues noted in this document were discussed and addressed.  No physical exam could be performed with this format.  Evaluation Performed:  Preoperative cardiovascular risk assessment _____________   Date:  07/14/2024   Patient ID:  [name], DOB 01-12-1956, MRN [id number] Patient Location:  Home Provider location:   Office  Primary Care Provider:  Kennyth Worth HERO, MD Primary Cardiologist:  Maude Emmer, MD  Chief Complaint / Patient Profile   68 y.o. y/o male with a h/o AML with bone marrow transplant in 2000, mitral regurgitation, chronic HFrEF/NICM, mild nonobstructive CAD by cath 11/2023,  who is pending left thumb CMC arthroplasty with parallel suspension and tendon transfer and repair reconstruction with Dr. Camella on date TBD and presents today for telephonic preoperative cardiovascular risk assessment.  History of Present Illness    [name] is a 68 y.o. male who presents via audio/video conferencing for a telehealth visit today.  Pt was last seen in cardiology clinic on 03/14/24 by Dr. Emmer.  At that time [name] was doing well.  The patient is now pending procedure as outlined above. Since his last visit, he  denies chest pain, shortness of breath, lower extremity edema, fatigue, palpitations, presyncope, syncope, orthopnea, and PND. He is very active with regular workouts, golf, and yard work and is able to achieve > 4 METS activity without concerning cardiac symptoms.    Past Medical History    Past Medical History:   Diagnosis Date   Acute myeloid leukemia in remission (HCC) 1999   Anemia    Arthritis    Depression    Diverticulosis of colon (without mention of hemorrhage)    Diverticulosis of colon (without mention of hemorrhage) 04/21/2011   Esophageal reflux    Family hx colonic polyps    Family hx colonic polyps 04/21/2011   Hiatal hernia    Hiatal hernia 04/21/2011   History of kidney stones    Hyperlipemia    Hypertension    MDS (myelodysplastic syndrome) (HCC)    Psychosexual dysfunction with inhibited sexual excitement    Past Surgical History:  Procedure Laterality Date   EAR EXAMINATION UNDER ANESTHESIA Right 10/29/2021   middle ear cleaned out infection, with an inplanted ear tube   KNEE SURGERY Left 08/14/2021   [facility]   RIGHT/LEFT HEART CATH AND CORONARY ANGIOGRAPHY N/A 11/10/2023   Procedure: RIGHT/LEFT HEART CATH AND CORONARY ANGIOGRAPHY;  Surgeon: Verlin Lonni BIRCH, MD;  Location: [facility] INVASIVE CV LAB;  Service: Cardiovascular;  Laterality: N/A;   SHOULDER SURGERY     x 3    TRANSESOPHAGEAL ECHOCARDIOGRAM (CATH LAB) N/A 11/11/2023   Procedure: TRANSESOPHAGEAL ECHOCARDIOGRAM;  Surgeon: Mona Vinie BROCKS, MD;  Location: [facility]AS[facility] CV [facility];  Service: Cardiovascular;  Laterality: N/A;    Allergies  Allergies  Allergen Reactions   Morphine Other (See Comments)    UNKNOWN - REACTION IS NOT LISTED Makes me crazy   Marinol [Dronabinol]     Delusions.    Home Medications    Prior to Admission medications  Medication Sig Start Date End Date Taking? Authorizing Provider  Ascorbic Acid (VITAMIN C) 500 MG CAPS Take 500 mg by mouth daily.    [provider]  calcium -vitamin D (OSCAL WITH D) 500-200 MG-UNIT TABS tablet Take 1 tablet by mouth daily. 07/11/19   [provider]  cetirizine  (ZYRTEC ) 10 MG tablet Take 1 tablet (10 mg total) by mouth daily. 05/06/23   Kennyth Worth HERO, MD  empagliflozin  (JARDIANCE ) 10 MG TABS tablet Take 1 tablet (10 mg total)  by mouth daily. 12/01/23   Delford Maude BROCKS, MD  EPIPEN 2-PAK 0.3 MG/0.3ML DEVI Inject 0.3 mg into the muscle as needed for anaphylaxis. 07/06/12   [provider]  famotidine  (PEPCID ) 20 MG tablet Take 20 mg by mouth at bedtime.    [provider]  fluticasone  (FLONASE ) 50 MCG/ACT nasal spray Place 1 spray into both nostrils daily.    [provider]  furosemide  (LASIX ) 20 MG tablet TAKE 1 TABLET BY MOUTH DAILY **IN CASE OF WEIGHT GAIN 2 TO 3 LBS IN 24 HOURS OR 5 LBS IN SEVEN DAYS, TAKE 2 TABLETS UNTIL WEIGHT BACK TO BASELINE** Patient taking differently: 10 mg. 1/2 a tab every other day 04/25/24   Nishan, Peter C, MD  metoprolol  succinate (TOPROL -XL) 25 MG 24 hr tablet Take 1.5 tablets (37.5 mg total) by mouth daily. 03/02/24   Nishan, Peter C, MD  Nutritional Supplements (JUICE PLUS FIBRE PO) Take by mouth. 2 tabs twice daily    [provider]  omeprazole  (PRILOSEC ) 20 MG capsule Take 20 mg by mouth 2 (two) times daily before a meal.    [provider]  pravastatin  (PRAVACHOL ) 20 MG tablet Take 1 tablet (20 mg total) by mouth daily. 12/01/23   Nishan, Peter C, MD  predniSONE  (DELTASONE ) 20 MG tablet Take 1 tablet (20 mg total) by mouth daily with breakfast. 07/01/24   Kennyth Worth HERO, MD  sacubitril -valsartan  (ENTRESTO ) 24-26 MG Take 1 tablet by mouth 2 (two) times daily. 12/24/23   Delford Maude BROCKS, MD  sertraline  (ZOLOFT ) 50 MG tablet TAKE 1 TABLET BY MOUTH DAILY 03/01/24   Kennyth Worth HERO, MD  sildenafil  (VIAGRA ) 100 MG tablet TAKE 1/2 TABLET BY MOUTH DAILY AS NEEDED FOR FOR ERECTILE DYSFUNCTION 09/02/23   Kennyth Worth HERO, MD  triamcinolone  ointment (KENALOG) 0.1 % Apply 1 Application topically as needed. 06/23/23   [provider]  valACYclovir  (VALTREX ) 500 MG tablet Take 1 tablet (500 mg total) by mouth 2 (two) times daily. As needed for flareups 06/30/23   Kennyth Worth HERO, MD    Physical Exam    Vital Signs:  [name] does not have  vital signs available for review today.  Given telephonic nature of communication, physical exam is limited. AAOx3. NAD. Normal affect.  Speech and respirations are unlabored.  Accessory Clinical Findings    None  Assessment & Plan    1.  Preoperative Cardiovascular Risk Assessment: According to the Revised Cardiac Risk Index (RCRI), his Perioperative Risk of Major Cardiac Event is (%): 0.9. His Functional Capacity in METs is: 8.33 according to the Duke Activity Status Index (DASI). The patient is doing well from a cardiac perspective. Therefore, based on ACC/AHA guidelines, the patient would be at acceptable risk for the planned procedure without further cardiovascular testing.   The patient was advised that if he develops new symptoms prior to surgery to contact our office to arrange for a follow-up visit, and he verbalized understanding.  No request  to hold cardiac medications.  A copy of this note will be routed to requesting surgeon.  Time:   Today, I have spent 10 minutes with the patient with telehealth technology discussing medical history, symptoms, and management plan.     Rosaline EMERSON Bane, NP-C  07/14/2024, 2:02 PM [address] [place], [place] [address] Office [phone] Fax [phone]

## 2024-07-25 ENCOUNTER — Encounter: Payer: Self-pay | Admitting: Family Medicine

## 2024-07-25 ENCOUNTER — Ambulatory Visit: Payer: Medicare Other | Admitting: Family Medicine

## 2024-07-25 VITALS — BP 102/68 | HR 72 | Temp 97.2°F | Ht 70.0 in | Wt 173.0 lb

## 2024-07-25 DIAGNOSIS — E785 Hyperlipidemia, unspecified: Secondary | ICD-10-CM

## 2024-07-25 DIAGNOSIS — R739 Hyperglycemia, unspecified: Secondary | ICD-10-CM | POA: Diagnosis not present

## 2024-07-25 DIAGNOSIS — N529 Male erectile dysfunction, unspecified: Secondary | ICD-10-CM | POA: Diagnosis not present

## 2024-07-25 DIAGNOSIS — N400 Enlarged prostate without lower urinary tract symptoms: Secondary | ICD-10-CM

## 2024-07-25 DIAGNOSIS — I10 Essential (primary) hypertension: Secondary | ICD-10-CM

## 2024-07-25 DIAGNOSIS — M8589 Other specified disorders of bone density and structure, multiple sites: Secondary | ICD-10-CM | POA: Diagnosis not present

## 2024-07-25 DIAGNOSIS — I5020 Unspecified systolic (congestive) heart failure: Secondary | ICD-10-CM | POA: Diagnosis not present

## 2024-07-25 DIAGNOSIS — G4700 Insomnia, unspecified: Secondary | ICD-10-CM | POA: Insufficient documentation

## 2024-07-25 DIAGNOSIS — M8580 Other specified disorders of bone density and structure, unspecified site: Secondary | ICD-10-CM | POA: Diagnosis not present

## 2024-07-25 DIAGNOSIS — J329 Chronic sinusitis, unspecified: Secondary | ICD-10-CM | POA: Diagnosis not present

## 2024-07-25 DIAGNOSIS — F419 Anxiety disorder, unspecified: Secondary | ICD-10-CM

## 2024-07-25 LAB — LIPID PANEL
Cholesterol: 150 mg/dL (ref 0–200)
HDL: 48.3 mg/dL (ref >39.00)
LDL Cholesterol: 78 mg/dL (ref 0–99)
NonHDL: 101.36
Total CHOL/HDL Ratio: 3
Triglycerides: 117 mg/dL (ref 0.0–149.0)
VLDL: 23.4 mg/dL (ref 0.0–40.0)

## 2024-07-25 LAB — CBC WITH DIFFERENTIAL/PLATELET
Basophils Absolute: 0 K/uL (ref 0.0–0.1)
Basophils Relative: 0.6 % (ref 0.0–3.0)
Eosinophils Absolute: 0.1 K/uL (ref 0.0–0.7)
Eosinophils Relative: 2.9 % (ref 0.0–5.0)
HCT: 39.9 % (ref 39.0–52.0)
Hemoglobin: 13.5 g/dL (ref 13.0–17.0)
Lymphocytes Relative: 26.5 % (ref 12.0–46.0)
Lymphs Abs: 1.1 K/uL (ref 0.7–4.0)
MCHC: 33.9 g/dL (ref 30.0–36.0)
MCV: 94.2 fl (ref 78.0–100.0)
Monocytes Absolute: 0.5 K/uL (ref 0.1–1.0)
Monocytes Relative: 11.4 % (ref 3.0–12.0)
Neutro Abs: 2.4 K/uL (ref 1.4–7.7)
Neutrophils Relative %: 58.6 % (ref 43.0–77.0)
Platelets: 228 K/uL (ref 150.0–400.0)
RBC: 4.24 Mil/uL (ref 4.22–5.81)
RDW: 12.4 % (ref 11.5–15.5)
WBC: 4 K/uL (ref 4.0–10.5)

## 2024-07-25 LAB — COMPREHENSIVE METABOLIC PANEL WITH GFR
ALT: 16 U/L (ref 0–53)
AST: 17 U/L (ref 0–37)
Albumin: 4.4 g/dL (ref 3.5–5.2)
Alkaline Phosphatase: 46 U/L (ref 39–117)
BUN: 26 mg/dL — ABNORMAL HIGH (ref 6–23)
CO2: 26 meq/L (ref 19–32)
Calcium: 9.3 mg/dL (ref 8.4–10.5)
Chloride: 101 meq/L (ref 96–112)
Creatinine, Ser: 1.59 mg/dL — ABNORMAL HIGH (ref 0.40–1.50)
GFR: 44.18 mL/min — ABNORMAL LOW (ref >60.00)
Glucose, Bld: 116 mg/dL — ABNORMAL HIGH (ref 70–99)
Potassium: 4.5 meq/L (ref 3.5–5.1)
Sodium: 135 meq/L (ref 135–145)
Total Bilirubin: 0.4 mg/dL (ref 0.2–1.2)
Total Protein: 6.5 g/dL (ref 6.0–8.3)

## 2024-07-25 LAB — PSA: PSA: 2.31 ng/mL (ref 0.10–4.00)

## 2024-07-25 LAB — HEMOGLOBIN A1C: Hgb A1c MFr Bld: 6.2 % (ref 4.6–6.5)

## 2024-07-25 LAB — TSH: TSH: 2.02 u[IU]/mL (ref 0.35–5.50)

## 2024-07-25 MED ORDER — AZITHROMYCIN 250 MG PO TABS: ORAL_TABLET | ORAL | 0 refills | Status: AC

## 2024-07-25 MED ORDER — TRAZODONE HCL 50 MG PO TABS: 50.0000 mg | ORAL_TABLET | Freq: Every day | ORAL | 3 refills | Status: AC

## 2024-07-25 MED ORDER — PREDNISONE 20 MG PO TABS: 20.0000 mg | ORAL_TABLET | Freq: Every day | ORAL | 0 refills | Status: DC

## 2024-07-25 NOTE — Assessment & Plan Note (Signed)
 Euvolemic today.  Continue Entresto , metoprolol , Jardiance , and Lasix  per cardiology.

## 2024-07-25 NOTE — Assessment & Plan Note (Signed)
Check A1c with labs. 

## 2024-07-25 NOTE — Assessment & Plan Note (Signed)
 Following with urology.  May be worsened due to Zoloft  which we are switching off of today.  He has not had much success with Cialis , Levitra , or Viagra .  Will be seeing urology later today to discuss potential other alternatives.

## 2024-07-25 NOTE — Assessment & Plan Note (Signed)
 Symptoms are well-controlled on Zoloft  50 mg daily though he is having some sexual side effects with this including erectile dysfunction and anorgasmia as well as insomnia issues.  We discussed augmenting with Wellbutrin versus switching to different class of medications.  Will switch to trazodone  50 mg daily.  He will cross titrate for the next week before going to trazodone  50 mg daily.  He will follow-up with us  in a few weeks to monitor and we can adjust as needed.

## 2024-07-25 NOTE — Assessment & Plan Note (Signed)
Check lipids.  He is on pravastatin 20 mg daily.

## 2024-07-25 NOTE — Assessment & Plan Note (Signed)
 Patient is having more difficulty with achieving REM sleep.  We discussed sleep hygiene measures.  Be switching his off of trazodone  as above which should hopefully help with his.

## 2024-07-25 NOTE — Patient Instructions (Signed)
 It was very nice to see you today!  VISIT SUMMARY: Today, you had a routine physical exam where we discussed several health concerns including sinus issues, upcoming thumb surgery, anxiety, sleep quality, erectile dysfunction, and constipation. We also reviewed your history of mitral valve regurgitation and bone marrow transplant.  YOUR PLAN: RECURRENT SINUSITIS: You are prone to sinusitis, especially in winter, and currently have a possible cold indicated by a throat tickle. -Prescribed azithromycin  (Z-Pak) and prednisone .  MITRAL VALVE REGURGITATION: You have a leaking mitral valve but are currently asymptomatic. -Discussed surgical risks and a potential less invasive approach at [facility]. -Consider getting a second opinion at [facility]. -Schedule an echocardiogram.  ERECTILE DYSFUNCTION: This may be related to long-term Zoloft  use, with limited success using Viagra . -Discussed trazodone  as an alternative. -Continue management with a urologist and consider trazodone  as an alternative to Zoloft .  DEPRESSION AND INSOMNIA: Long-term Zoloft  use is causing sexual side effects and insomnia. -Discontinued Zoloft  and initiated trazodone  with a cross-taper for one week. -Monitor response to trazodone .  CONSTIPATION: Likely due to low dietary fiber. -Start Metamucil and increase the dose as needed. -Ensure adequate water intake.  GENERAL HEALTH MAINTENANCE: You are due for blood work and a bone density scan. COVID and RSV vaccines are pending due to cold symptoms. -Ordered blood work including A1c, cholesterol, PSA, blood counts, kidney, liver, electrolytes, and thyroid . -Ordered a bone density scan. -Plan COVID and RSV vaccines post-recovery.  Return in about 1 year (around 07/25/2025) for Annual Physical.   Take care, Dr Kennyth  PLEASE NOTE:  If you had any lab tests, please let us  know if you have not heard back within a few days. You may see your results on mychart before we have a chance to  review them but we will give you a call once they are reviewed by us .   If we ordered any referrals today, please let us  know if you have not heard from their office within the next week.   If you had any urgent prescriptions sent in today, please check with the pharmacy within an hour of our visit to make sure the prescription was transmitted appropriately.   Please try these tips to maintain a healthy lifestyle:  Eat at least 3 REAL meals and 1-2 snacks per day.  Aim for no more than 5 hours between eating.  If you eat breakfast, please do so within one hour of getting up.   Each meal should contain half fruits/vegetables, one quarter protein, and one quarter carbs (no bigger than a computer mouse)  Cut down on sweet beverages. This includes juice, soda, and sweet tea.   Drink at least 1 glass of water with each meal and aim for at least 8 glasses per day  Exercise at least 150 minutes every week.    Preventive Care 26 Years and Older, Male Preventive care refers to lifestyle choices and visits with your health care provider that can promote health and wellness. Preventive care visits are also called wellness exams. What can I expect for my preventive care visit? Counseling During your preventive care visit, your health care provider may ask about your: Medical history, including: Past medical problems. Family medical history. History of falls. Current health, including: Emotional well-being. Home life and relationship well-being. Sexual activity. Memory and ability to understand (cognition). Lifestyle, including: Alcohol, nicotine or tobacco, and drug use. Access to firearms. Diet, exercise, and sleep habits. Work and work astronomer. Sunscreen use. Safety issues such as seatbelt and  bike helmet use. Physical exam Your health care provider will check your: Height and weight. These may be used to calculate your BMI (body mass index). BMI is a measurement that tells if you  are at a healthy weight. Waist circumference. This measures the distance around your waistline. This measurement also tells if you are at a healthy weight and may help predict your risk of certain diseases, such as type 2 diabetes and high blood pressure. Heart rate and blood pressure. Body temperature. Skin for abnormal spots. What immunizations do I need?  Vaccines are usually given at various ages, according to a schedule. Your health care provider will recommend vaccines for you based on your age, medical history, and lifestyle or other factors, such as travel or where you work. What tests do I need? Screening Your health care provider may recommend screening tests for certain conditions. This may include: Lipid and cholesterol levels. Diabetes screening. This is done by checking your blood sugar (glucose) after you have not eaten for a while (fasting). Hepatitis C test. Hepatitis B test. HIV (human immunodeficiency virus) test. STI (sexually transmitted infection) testing, if you are at risk. Lung cancer screening. Colorectal cancer screening. Prostate cancer screening. Abdominal aortic aneurysm (AAA) screening. You may need this if you are a current or former smoker. Talk with your health care provider about your test results, treatment options, and if necessary, the need for more tests. Follow these instructions at home: Eating and drinking  Eat a diet that includes fresh fruits and vegetables, whole grains, lean protein, and low-fat dairy products. Limit your intake of foods with high amounts of sugar, saturated fats, and salt. Take vitamin and mineral supplements as recommended by your health care provider. Do not drink alcohol if your health care provider tells you not to drink. If you drink alcohol: Limit how much you have to 0-2 drinks a day. Know how much alcohol is in your drink. In the [place] one drink equals one 12 oz bottle of beer (355 mL), one 5 oz glass of wine (148  mL), or one 1 oz glass of hard liquor (44 mL). Lifestyle Brush your teeth every morning and night with fluoride toothpaste. Floss one time each day. Exercise for at least 30 minutes 5 or more days each week. Do not use any products that contain nicotine or tobacco. These products include cigarettes, chewing tobacco, and vaping devices, such as e-cigarettes. If you need help quitting, ask your health care provider. Do not use drugs. If you are sexually active, practice safe sex. Use a condom or other form of protection to prevent STIs. Take aspirin  only as told by your health care provider. Make sure that you understand how much to take and what form to take. Work with your health care provider to find out whether it is safe and beneficial for you to take aspirin  daily. Ask your health care provider if you need to take a cholesterol-lowering medicine (statin). Find healthy ways to manage stress, such as: Meditation, yoga, or listening to music. Journaling. Talking to a trusted person. Spending time with friends and family. Safety Always wear your seat belt while driving or riding in a vehicle. Do not drive: If you have been drinking alcohol. Do not ride with someone who has been drinking. When you are tired or distracted. While texting. If you have been using any mind-altering substances or drugs. Wear a helmet and other protective equipment during sports activities. If you have firearms in your house, make  sure you follow all gun safety procedures. Minimize exposure to UV radiation to reduce your risk of skin cancer. What's next? Visit your health care provider once a year for an annual wellness visit. Ask your health care provider how often you should have your eyes and teeth checked. Stay up to date on all vaccines. This information is not intended to replace advice given to you by your health care provider. Make sure you discuss any questions you have with your health care  provider. Document Revised: 01/23/2021 Document Reviewed: 01/23/2021 Elsevier Patient Education  2024 [employer].

## 2024-07-25 NOTE — Assessment & Plan Note (Signed)
 Patient with current URI symptoms though does have history of recurrent sinusitis.  No red flag signs or symptoms.  Will send in pocket prescription for Z-Pak and prednisone  which he has done well within the past though he would not take this unless needed.

## 2024-07-25 NOTE — Assessment & Plan Note (Signed)
 Follows with urology.  Check PSA

## 2024-07-25 NOTE — Progress Notes (Signed)
 [name] is a 68 y.o. male who presents today for an office visit.  Assessment/Plan:  New/Acute Problems: Constipation  No red flags.  Recommended increasing fluid intake and using over-the-counter fiber supplement such as Metamucil as needed.  He will let us  know if not improving.  Chronic Problems Addressed Today: Anxiety   Symptoms are well-controlled on Zoloft  50 mg daily though he is having some sexual side effects with this including erectile dysfunction and anorgasmia as well as insomnia issues.  We discussed augmenting with Wellbutrin versus switching to different class of medications.  Will switch to trazodone  50 mg daily.  He will cross titrate for the next week before going to trazodone  50 mg daily.  He will follow-up with us  in a few weeks to monitor and we can adjust as needed.  Insomnia Patient is having more difficulty with achieving REM sleep.  We discussed sleep hygiene measures.  Be switching his off of trazodone  as above which should hopefully help with his.  Dyslipidemia Check lipids.  He is on pravastatin  20 mg daily.  Essential hypertension, benign Blood sugar at goal today on regimen per cardiology with metoprolol  succinate 37.5 mg daily and Entresto  24-26 twice daily  BPH (benign prostatic hyperplasia) Follows with urology.  Check PSA.  Hyperglycemia Check A1c with labs  Erectile dysfunction Following with urology.  May be worsened due to Zoloft  which we are switching off of today.  He has not had much success with Cialis , Levitra , or Viagra .  Will be seeing urology later today to discuss potential other alternatives.  Recurrent sinusitis Patient with current URI symptoms though does have history of recurrent sinusitis.  No red flag signs or symptoms.  Will send in pocket prescription for Z-Pak and prednisone  which he has done well within the past though he would not take this unless needed.  Osteopenia last DEXA 2023 Repeat bone density  scan.  HFrEF (heart failure with reduced ejection fraction) (HCC) Euvolemic today.  Continue Entresto , metoprolol , Jardiance , and Lasix  per cardiology.  Preventative health care Check labs.  He will get RSV vaccine at the pharmacy.  Up-to-date on other vaccines and screenings.    Subjective:  HPI:  See assessment / plan for status of chronic conditions.   Discussed the use of AI scribe software for clinical note transcription with the patient, who gave verbal consent to proceed.  History of Present Illness [name] is a 68 year old male who presents for a routine physical exam.  He has a history of sinus issues, which were present during his last visit approximately five to six weeks ago. The sinus problem has resolved, but he now experiences a tickle in his throat, dry cough, and some post-nasal drip. No significant congestion is noted at this time.  He is scheduled for thumb surgery on January 6th to address arthritis in the [facility] joint, described as 'bone on bone.' The procedure will involve removing the joint and replacing it with a ligament. He anticipates a long and painful rehabilitation period of about five months.  He has been on Zoloft  50 mg since 1999 for anxiety, which helps with staying awake at night due to ruminating thoughts. While it has been effective, he reports difficulty reaching climax and some erectile dysfunction, which he attributes to Zoloft . He has tried Viagra  and Cialis  for erectile issues but finds them ineffective in maintaining an erection.  He uses a Fitbit to monitor his sleep and notes poor REM sleep, which  he feels has worsened over time. He acknowledges that alcohol affects his sleep quality and is trying to improve his sleep.  He is concerned about not getting enough fiber in his diet, leading to constipation. He has tried increasing dietary fiber through bran muffins and chia seeds but continues to experience constipation.  He has a  history of a bone marrow transplant for myelodysplasia syndrome. He monitors his weight daily and has no symptoms related to his mitral valve leak, which is being managed with heart medications.         Objective:  Physical Exam: BP 102/68   Pulse 72   Temp (!) 97.2 F (36.2 C) (Temporal)   Ht 5' 10 (1.778 m)   Wt 173 lb (78.5 kg)   SpO2 96%   BMI 24.82 kg/m   Gen: No acute distress, resting comfortably CV: Regular rate and rhythm with 2/6 systolic murmurs appreciated Pulm: Normal work of breathing, clear to auscultation bilaterally with no crackles, wheezes, or rhonchi Neuro: Grossly normal, moves all extremities Psych: Normal affect and thought content  Time Spent: 45 minutes of total time was spent on the date of the encounter performing the following actions: chart review prior to seeing the patient, obtaining history, performing a medically necessary exam, counseling on the treatment plan, placing orders, and documenting in our EHR.        Worth HERO. Kennyth, MD 07/25/2024 9:43 AM

## 2024-07-25 NOTE — Assessment & Plan Note (Signed)
 Repeat bone density scan

## 2024-07-25 NOTE — Assessment & Plan Note (Signed)
 Blood sugar at goal today on regimen per cardiology with metoprolol  succinate 37.5 mg daily and Entresto  24-26 twice daily

## 2024-07-25 NOTE — Progress Notes (Signed)
 Chief Complaint: This patient is seen in follow up   Fall Screening     History of Present Illness: [name] is a 68 y.o. male presenting for trimix teaching.  This patient is seen in follow up of ED   Dr. Nicholaus 09/2022: gentleman whose mother I cared for and has AML. He is status post chemotherapy and stem cell transplant.  He had a penile rash in the past that has been treated. He has some difficulty voiding but his IPSS score was only 13 at our last visit.  He has been on sildenafil . His PSAs were 1.78 on 02/20/2020, 1.94 on 07/17/2020, 1.58 on 10/15/2020 and 1.99 on 09/09/2021. He has a family history of prostate cancer, so he had some concerns along those lines.  Tumor marker pattern over time has been the following:  PROSTATE SPECIFIC AG  Date Value Ref Range Status  01/21/2011 0.92 (0.00-4.00) NG/ML   PSA  Date Value Ref Range Status  09/09/2021 1.99 0.00 - 4.50 NG/ML Final  Comment:  Results are obtained using [employer] DxI 800, which uses a chemiluminescent immunoassay. Patient results determined by assays using a different manufacturer and/or method may not be comparable.  10/09/2022: 1.64 10/15/2020 1.58 0.00 - 4.00 NG/ML Final  07/17/2020 1.94 0.00 - 4.00 NG/ML Final  05/18/2020 2.69 0.00 - 4.00 NG/ML Final  02/27/2020 1.90 0.00 - 4.00 NG/ML Final  02/20/2020 1.78 0.00 - 4.00 NG/ML Final  01/19/2018 0.80 0.01 - 4.00 NG/ML Final    01/22/2024 Returns today.  Overall doing well.  No B symptoms.  No gross hematuria.  He is on a beta-blocker and an SSRI.  He has to take these at a separate time from the day from when he plans to be intermittent is using full dose of Viagra  at this time.  Nocturia X2 and he feels his stream is slow, but he empties.  He thinks he is tried tamsulosin in the past and had retrograde ejaculation or other sexual issues in conjunction with his other medications.  He stopped it due to this. He plays golf and walks the course and also just  walks a few times a week. Notices erectile function is better when he has more physical activity.   PLAN 1. Family history of prostate cancer  PSA, Total (Diagnostic) With Reflex to PSA, Free if Indicated    PSA, Total (Diagnostic) With Reflex to PSA, Free if Indicated     2. Nocturia        3. Benign prostatic hyperplasia with weak urinary stream  PSA, Total (Diagnostic) With Reflex to PSA, Free if Indicated    tadalafiL  (CIALIS ) 5 mg tablet    PSA, Total (Diagnostic) With Reflex to PSA, Free if Indicated     4. Erectile dysfunction, unspecified erectile dysfunction type            Advised the following  For weak urinary stream we discussed we could switch to Cialis  daily 5 mg and see how he does with this for voiding. He'll trial 50mg  sildenafil  for ED in addition with understanding neither medication should be taken more than once daily. When he is out of the sildenafil  (he has a good supply) we'll discuss how to proceed with ED therapy. He's tried tamsulosin in the past with undesirable sexual side effects. See instructions below. Follow up requested on green sheet to front desk. He does take an SSRI at a relatively low dose as well as a beta blocker and we discussed  how this can interact with libido, as well as sexual response cycle today.   04/27/2024 PSA 01/22/2024 3.11 ED is OK. He is still taking 50mg  viagra  as his ED PRN and not always with a good duration. He is on SSRI with some difficulty achieving ejaculation PLAN See below. Follow up with phone visit in a couple months to see how the ED meds are doing. Flow, PVR, PSA 6 mo. If doing well we can push out to annually. No orders of the defined types were placed in this encounter.     Patient Instructions  On Zoloft  - this can make it difficult to orgasm for some people. Can talk to your provider who prescribes this and consider addition of very low dose bupropion . That is one possible option.   Let's stop the sildenafil  and add  in 10-15mg  of cialis  one hour before sex in addition to the 5mg  for BPH. Do not use the additional dose more frequently than every 24 hours.   06/30/2024 He is able to get an erection (5mg  Cialis  in the AM; and then 15mg  prior to intercourse), but some difficulty sustaining. If has taken his AM meds doesn't work well. He is on toprol  and a SSRI. He had a heart cath and was clear overall on this from a vascular standpoint Did not like retrograde ejaculation on tamsulosin - OK urinary control on 5mg  tadalafil  and would like to continue this We discussed DM and hypertension can result in both vascular and neurogenic components to ED He is getting morning erections, but loses erection with insertion even when holding SSRI and beta-blocker 24 hours - advised these medications take precedent to sexual health and he's in agreement He would like to try trimix. [facility] 10-30-1 with starting dose of 0.05mL; will follow up with AM appointment and test dose - discussed risks specifically risk of priapism. As an alternative did discuss he could trial wellbutrin  at a subtherapeutic dose along with SSRI in coordination with his PCP; he'd like to just go with the trimix for now Reviewed dosing with attending today given concurrent daily BPH use of cialis  5mg  and patient desire to avoid alpha blocker  07/25/2024 History of Present Illness The patient is a 68 year old male who presents for erectile dysfunction.  He has brought the Trimix vial and syringes, expressing a desire to administer a test dose today. He has previously self-administered injections and is currently on a daily regimen of Cialis  5 mg for benign prostatic hyperplasia (BPH). The Trimix medication has been stored in his refrigerator since its acquisition on 07/21/2024.  During his annual physical examination with his primary care physician, he reported difficulties in achieving climax, a side effect he attributes to his  long-term use of Zoloft  50 mg. His physician suggested the addition of Wellbutrin  or trazodone  to his treatment plan. He does not engage in meditation or listen to music prior to sleep. He also reports waking up in the middle of the night to solve problems. He has been on Zoloft  50 mg for 30 years. Please see dictation for additional details.  Current Medications[1]   Allergies[2]  Medical History[3]   Surgical History[4]   Family History[5]   Social Connections: Socially Integrated (06/21/2024)   Received from [facility]   Social Connection and Isolation Panel    In a typical week, how many times do you talk on the phone with family, friends, or neighbors?: More than three times a week  How often do you get together with friends or relatives?: More than three times a week    How often do you attend church or religious services?: More than 4 times per year    Do you belong to any clubs or organizations such as church groups, unions, fraternal or athletic groups, or school groups?: Yes    How often do you attend meetings of the clubs or organizations you belong to?: 1 to 4 times per year    Are you married, widowed, divorced, separated, never married, or living with a partner?: Married     Vitals:   07/25/24 1443  BP: 114/65  Pulse: 70  SpO2: 98%    There is no height or weight on file to calculate BMI.  ROS  See Assessment and HPI.  Genitourinary:  As per HPI.  Physical Exam  Physical Exam Physical Exam    Assessment and Plan Tumor marker pattern over time has been the following:  Lab Results  Component Value Date   PSA 3.11 01/22/2024   PSA 1.64 10/09/2022   PSA 1.99 09/09/2021   PSA 1.58 10/15/2020   PSA 1.94 07/17/2020   PSA 2.69 05/18/2020   PSA 1.90 02/27/2020   PSA 1.78 02/20/2020    Relevant Results   Results    Advised the following  Assessment/Plan Diagnoses and all orders for this visit:  Anxiety  Erectile dysfunction,  unspecified erectile dysfunction type     Assessment & Plan 1. Erectile dysfunction. Trimix medication should be stored in the freezer for up to 90 days. The initial dose recommended is 0.05 mL, which can be increased by 0.05 mL increments up to a maximum of 0.4 mL. Proper injection technique was reviewed, including drawing up the medication, injection sites, and ensuring the needle remains fully inserted during administration to avoid scar tissue formation. He was advised to discontinue Cialis  5 mg on the day of the first Trimix injection but to continue the daily Cialis  5 mg regimen otherwise. He was warned about the risk of priapism with Trimix and instructed to seek emergency care if an erection lasts longer than 2.5 hours or becomes painful. Concurrent use of trazodone  and Trimix is contraindicated due to increased risk of priapism.  2. Medication management. He was advised to discontinue Zoloft  and start trazodone  at bedtime. Guided meditations were suggested as an additional sleep aid. If trazodone  is ineffective, he may return to the Zoloft  regimen.   Follow-up A follow-up appointment is scheduled in 2 months.    PCP tapering SSRI and started trazodone . We discussed trimix teaching. We discussed not using in conjunction with trazodone  with priapism risk. We'll see how he does off the SSRI. Follow up 2 months and we can revisit trimix if he resumes SSRI or other medication for anxiety  We spent over 30 minutes on the case today with over half in face to face clinical discussion. No orders of the defined types were placed in this encounter.   There are no Patient Instructions on file for this visit.  No follow-ups on file.   Electronically signed by: Seena Maranda Mail III, PA-C 08/12/2024 6:44 AM           [1]  Current Outpatient Medications:    acyclovir  (ZOVIRAX ) 800 mg tablet, , Disp: , Rfl:    ascorbic acid, vitamin C, 500 mg cap, Take 500 mg by mouth., Disp: ,  Rfl:    cholecalciferol (VITAMIN D3) 1,000 unit (25 mcg) tablet, Take 1 tablet  by mouth Once Daily., Disp: , Rfl:    empagliflozin  (JARDIANCE ) 10 mg tab, Take 10 mg by mouth daily., Disp: , Rfl:    EPINEPHrine  (EPIPEN ) 0.3 mg/0.3 mL injection syringe, Inject 0.3 mg into the muscle., Disp: , Rfl:    famotidine  (PEPCID ) 20 mg tablet, Take 20 mg by mouth., Disp: , Rfl:    fluticasone  propionate (FLONASE ) 50 mcg/spray nasal spray, Administer 1 spray into affected nostril(s)., Disp: , Rfl:    furosemide  (LASIX ) 20 mg tablet, Take 20 mg by mouth., Disp: , Rfl:    meloxicam  (MOBIC ) 15 mg tablet, Take 15 mg by mouth 3 (three) times a week., Disp: , Rfl:    metoprolol  succinate (TOPROL  XL) 25 mg 24 hr tablet, Take 37.5 mg by mouth., Disp: , Rfl:    omeprazole  (PriLOSEC ) 20 mg DR capsule, Take 20 mg by mouth 2 (two) times a day., Disp: , Rfl:    pravastatin  (PRAVACHOL ) 20 mg tablet, Take 20 mg by mouth daily., Disp: , Rfl:    sacubitriL -valsartan  (Entresto ) 97-103 mg per tablet, Take 1 tablet by mouth 2 (two) times a day., Disp: , Rfl:    sertraline  (ZOLOFT ) 50 mg tablet, Take 50 mg by mouth daily., Disp: , Rfl:    tadalafiL  (CIALIS ) 5 mg tablet, FOR BPH. Take one 5 mg tablet once daily at approximately the same time daily without regard to timing of sexual activity., Disp: 30 tablet, Rfl: 11   triamcinolone  acetonide (KENALOG) 0.1 % ointment, Apply 1 Application topically as needed., Disp: , Rfl:    valACYclovir  (VALTREX ) 500 mg tablet, , Disp: , Rfl:    cyanocobalamin  (VITAMIN B12) 1,000 mcg tablet, Take 1 tablet by mouth Once Daily. (Patient not taking: Reported on 06/30/2024), Disp: , Rfl:    methylPREDNISolone  (MEDROL  DOSEPAK) 4 mg 6 day dose pack, Take As Directed On Package (Patient not taking: Reported on 06/30/2024), Disp: 21 tablet, Rfl: 0   MISCELLANEOUS MEDICATION/PRODUCT, Trimix 10/30/1 for intracorporeal (penile) injections for erectile dysfunction. Prostin 10 mcg/ml +  Papaverine 30 mg/ml + Phentolamine 1.0 mg/ml. Dispense 10 ml total with 3 refills. Instructions: Starting dose: inject 0.05 ml into penis prior to sex as directed. No more than once every other day., Disp: 10 mL, Rfl: 3   montelukast  (SINGULAIR ) 10 mg tablet, Take 10 mg by mouth nightly. (Patient not taking: Reported on 06/30/2024), Disp: , Rfl:  [2] Allergies Allergen Reactions   Dronabinol Other (See Comments)    Causes Hallucinations    Morphine Sulfate Other (See Comments)    UNKNOWN - REACTION IS NOT LISTED  [3] Past Medical History: Diagnosis Date   Acute myeloid leukemia in remission    (CMD)    Allergy 1990   Arthritis    Basal cell carcinoma 01/2015   right temple; s/p removal   Chronic prostatitis 1995   Depressive disorder, not elsewhere classified    Dyslipidemia 2019   On atorvastatin    Hearing loss    Helicobacter pylori gastritis    Hiatal hernia with gastroesophageal reflux    h/o GERD - treated with omeprazole    HTN (hypertension), benign 2000   currently on amlodipine    Hyperglycemia 2019   started on metformin  but hgbA1C normal range so not taking now   Nephrolithiasis 1990   Otitis media    Prostatitis, chronic    Squamous cell carcinoma of skin of face 1995   Unspecified essential hypertension   [4] Past Surgical History: Procedure Laterality Date   ADENOIDECTOMY  1960  Procedure: ADENOIDECTOMY   BONE MARROW TRANSPLANT     Procedure: BONE MARROW TRANSPLANT   COLONOSCOPY  09/2021   Procedure: COLONOSCOPY   KNEE SURGERY  11/2017   Procedure: KNEE SURGERY   MASTOIDECTOMY Right 10/29/2021   Procedure: CANAL WALL UP TYMPANOMASTOIDECTOMY;  Surgeon: Camellia Layman Milliner, MD;  Location: [facility] PEDS OR;  Service: ENT;  Laterality: Right;  Will need Leica microscope, NIM monitor, K-helix prosthesis, ionomer cement,    SHOULDER SURGERY  1995   Procedure: SHOULDER SURGERY; needed repain in 1995 2/2 injury.  also had surgery in 2015 and  2017 2/2 arthritic issues   TONSILLECTOMY  1960   Procedure: TONSILLECTOMY   TYMPANOSTOMY TUBE PLACEMENT  06/2021   Procedure: TYMPANOSTOMY TUBE PLACEMENT   TYMPANOSTOMY TUBE PLACEMENT Bilateral 10/29/2021   Procedure: TYMPANOTOMY W/ TUBES;  Surgeon: Camellia Layman Milliner, MD;  Location: [facility] PEDS OR;  Service: ENT;  Laterality: Bilateral;  [5] Family History Problem Relation Name Age of Onset   Stroke Mother Nell    Atrial fibrillation Mother Nell    Cancer Mother Nell    Cancer Father Alexa        Prostate cancer   GI problems Father Alexa

## 2024-07-28 ENCOUNTER — Ambulatory Visit: Payer: Self-pay | Admitting: Family Medicine

## 2024-07-28 MED ORDER — PANTOPRAZOLE SODIUM 40 MG PO TBEC: 40.0000 mg | DELAYED_RELEASE_TABLET | Freq: Every morning | ORAL | 3 refills | Status: DC

## 2024-07-28 NOTE — Progress Notes (Signed)
 Labs are overall stable.  He did have a very mild increase in his kidney numbers compared to a couple of months ago but this is probably due to mild dehydration.  He should push fluids is much as he can and have this rechecked in a month or 2.  A1c is mildly elevated at 6.2 but stable compared to his past several values.  All of his other labs are at goal and we can recheck in a year.

## 2024-07-28 NOTE — Telephone Encounter (Signed)
 Please send a new prescription for Protonix  40 mg daily.  He should let us  know if not improving in the next couple weeks

## 2024-07-28 NOTE — Telephone Encounter (Signed)
 Medications have been sent to pharmacy on file. Patient notified or medications and Dr.  Carroll note via MyChart.

## 2024-08-05 ENCOUNTER — Other Ambulatory Visit: Payer: Self-pay | Admitting: Cardiovascular Disease

## 2024-08-09 ENCOUNTER — Other Ambulatory Visit: Payer: Self-pay

## 2024-08-09 ENCOUNTER — Telehealth: Payer: Self-pay

## 2024-08-09 ENCOUNTER — Encounter (HOSPITAL_COMMUNITY): Payer: Self-pay | Admitting: Orthopedic Surgery

## 2024-08-09 NOTE — Telephone Encounter (Signed)
 Ok to send in Wellbutrin 150 mg daily. He can taper off the trazodone  over the next 1-2 weeks but I would like for him to follow up with us  soon.

## 2024-08-09 NOTE — Telephone Encounter (Signed)
 Please advise on switching medications or if patient needs to come in for an appointment to discuss symptoms before switching medications.   Please and thank you  Copied from CRM #[id number]. Topic: Clinical - Prescription Issue >> Aug 09, 2024  1:57 PM China J wrote: Reason for CRM: Patient does not like the switch he had made to trazodone . He was wondering if he could try wellbutrin instead of trazadone. The patient said that the trazadone makes him feel groggy and fatigued.  Please call [phone] for an update.

## 2024-08-09 NOTE — Progress Notes (Signed)
 PCP - Dr. Worth Kitty Cardiologist - Dr. Maude Emmer  PPM/ICD - denies   Chest x-ray - 11/08/23 EKG - 11/10/23 Stress Test - denies ECHO - 11/11/23 Cardiac Cath - 11/10/23  CPAP - denies  DM- denies  ASA/Blood Thinner Instructions: n/a   ERAS Protcol - clears until 1245  COVID TEST- n/a  Anesthesia review: yes, cardiac hx  Patient verbally denies any shortness of breath, fever, cough and chest pain during phone call      Questions were answered. Patient verbalized understanding of instructions.

## 2024-08-09 NOTE — Pre-Procedure Instructions (Signed)
-------------    SDW INSTRUCTIONS given:  Your procedure is scheduled on 1/6.  Report to [facility] Main Entrance A at 1:15 P.M., and check in at the Admitting office.  Any questions or running late day of surgery: call [phone]    Remember:  Do not eat after midnight the night before your surgery  You may drink clear liquids until 12:45 PM the morning of your surgery.   Clear liquids allowed are: Water, Non-Citrus Juices (without pulp), Carbonated Beverages, Clear Tea, Black Coffee Only, and Gatorade    Take these medicines the morning of surgery with A SIP OF WATER  fluticasone  (FLONASE )  JARDIANCE   metoprolol  succinate (TOPROL -XL)  pantoprazole  (PROTONIX )  pravastatin  (PRAVACHOL )    May take these medicines IF NEEDED: valACYclovir  (VALTREX )    As of today, STOP taking any Aspirin  (unless otherwise instructed by your surgeon) Aleve, Naproxen, Ibuprofen, Motrin, Advil, Goody's, BC's, all herbal medications, fish oil, and all vitamins.   Do NOT Smoke (Tobacco/Vaping) 24 hours prior to your procedure  If you use a CPAP at night, you may bring all equipment for your overnight stay.     You will be asked to remove any contacts, glasses, piercing's, hearing aid's, dentures/partials prior to surgery. Please bring cases for these items if needed.     Patients discharged the day of surgery will not be allowed to drive home, and someone needs to stay with them for 24 hours.  SURGICAL WAITING ROOM VISITATION Patients may have no more than 2 support people in the waiting area - these visitors may rotate.   Pre-op nurse will coordinate an appropriate time for 1 ADULT support person, who may not rotate, to accompany patient in pre-op.  Children under the age of 56 must have an adult with them who is not the patient and must remain in the main waiting area with an adult.  If the patient needs to stay at the hospital during part of their recovery, the visitor guidelines for inpatient  rooms apply.  Please refer to the [facility] website for the visitor guidelines for any additional information.   Special instructions:   [place]- Preparing For Surgery   Please follow these instructions carefully.   Shower the NIGHT BEFORE SURGERY and the MORNING OF SURGERY with DIAL Soap.   Pat yourself dry with a CLEAN TOWEL.  Wear CLEAN PAJAMAS to bed the night before surgery  Place CLEAN SHEETS on your bed the night of your first shower and DO NOT SLEEP WITH PETS.   Additional instructions for the day of surgery: DO NOT APPLY any lotions, deodorants, cologne, or perfumes.   Do not wear jewelry or makeup Do not wear nail polish, gel polish, artificial nails, or any other type of covering on natural nails (fingers and toes) Do not bring valuables to the hospital. [facility] is not responsible for valuables/personal belongings. Put on clean/comfortable clothes.  Please brush your teeth.  Ask your nurse before applying any prescription medications to the skin.

## 2024-08-10 ENCOUNTER — Encounter (HOSPITAL_COMMUNITY): Payer: Self-pay | Admitting: Orthopedic Surgery

## 2024-08-10 MED ORDER — BUPROPION HCL ER (XL) 150 MG PO TB24: 150.0000 mg | ORAL_TABLET | Freq: Every day | ORAL | 0 refills | Status: AC

## 2024-08-10 NOTE — Telephone Encounter (Signed)
 Spoke to pt told him Dr. Kennyth said,Ok to send in Wellbutrin 150 mg daily. He can taper off the trazodone  over the next 1-2 weeks but I would like for him to follow up with us  soon. Pt verbalized understanding. Told pt will send Rx. Rx sent.

## 2024-08-10 NOTE — Progress Notes (Addendum)
 Anesthesia Chart Review: SAME DAY WORK-UP  Case: [id number] Date/Time: 08/16/24 1530   Procedure: CARPOMETACARPEL (CMC) SUSPENSION PLASTY (Left) - Left thumb carpometacarpal arthroplasty with parallel suspension and tendon transfer and repair reconstruction   Anesthesia type: Choice   Diagnosis: Primary osteoarthritis of first carpometacarpal joint of left hand [M18.12]   Pre-op diagnosis: Left thumb carpometacarpal arthritis   [facility] / [facility]   Surgeons: Camella Fallow, MD       DISCUSSION: Patient is a 68 year old male scheduled for the above procedure.  History includes never smoker, HTN, HLD, NICM (mild non-obstructive CAD 11/2023), HFrEF, mitral regurgitation, SVT (11/10/2023), AML (~ 1991 s/p autologous stem cell transplant 09/1998; MDS on bone marrow biopsy 03/2019 with repeat transplant 06/08/2019), hiatal hernia.  Admission 11/08/2023 - 11/11/2023 for acute on chronic systolic CHF. TTE showed LVEF 30-35%, global LV hypokinesis but with severe hypokinesis in the inferior and inferolateral walls, grade II diastolic dysfunction, low normal RV systolic function, mildly elevated PASP, RVSP 26.5 mmHg, posterior MV leaflet tethering with moderate-severe MR. He underwent RHC/LHC on 11/10/2023 that showed mild nonobstructive CAD and normal right heart pressures. 11/11/2023 TEE results similar to TTE with EF 30-35%, global hypokinesis with severe hypokinesis of the basal-mid inferior and inferolateral walls, no LAA thrombus, tethering of the P2 segment of the posterior MV leaflet with grossly normal MV, moderate to severe MR, trivial AI. He also developed episodes of SVT during hospitalization, and metoprolol  dose was increased. He had follow-up with structural heart cardiologist Dr. Wonda on 12/23/2023 and wrote, The patient has improved significantly since he was hospitalized last month. He currently has NYHA functional class I symptoms and is essentially asymptomatic with all of his normal  activities. He remains quite active with regular exercise as outlined in the HPI. The patient's TEE images are personally reviewed. He does have severe LV dysfunction with LVEF 30 to 35% and significant mitral regurgitation with the regurgitant volume of 33 mL, ERO of 0.20 cm and MR radius of 0.7 cm. Pulmonary vein flow is antegrade. I think these findings are consistent with moderate mitral regurgitation and I would favor ongoing medical therapy. The patient is on a good program of GDMT and he has upcoming follow-up with Dr. Delford later this summer. I think he should have a surveillance echocardiogram about 6 months out from his previous echo and this will be ordered today. I will plan to see him back on an as-needed basis and he can continue to follow with Dr. Delford who is on our valve team. He later had follow-up with his primary cardiologist Dr. Delford on 03/14/2024 with plans to repeat echo in April 2026. His notes mentions cardiomyopathy possible related to prior cancer treatment. 2023 cMRI suggested prior myocarditis.   Preoperative cardiology televisit on 07/14/2024 by Percy Browning, NP, Preoperative Cardiovascular Risk Assessment: According to the Revised Cardiac Risk Index (RCRI), his Perioperative Risk of Major Cardiac Event is (%): 0.9. His Functional Capacity in METs is: 8.33 according to the Duke Activity Status Index (DASI). The patient is doing well from a cardiac perspective. Therefore, based on ACC/AHA guidelines, the patient would be at acceptable risk for the planned procedure without further cardiovascular testing...   A1c 6.2% on 07/25/2024. He is on Jardiance  as part of HF regimen.  Anesthesia team to evaluate on the day of surgery.   VS: Ht 5' 10 (1.778 m)   Wt 78.5 kg   BMI 24.83 kg/m  BP Readings from Last 3 Encounters:  07/25/24 102/68  06/21/24 120/80  06/09/24 118/72   Pulse Readings from Last 3 Encounters:  07/25/24 72  06/21/24 67  06/09/24 75      PROVIDERS: Kennyth Worth HERO, MD is PCP Delford Coy, MD his cardiologist Wonda Sharper, MD is structural heart cardiologist - Per 01/12/2023 oncology follow-up with Kayla Breck Jacob, MD, recommendation for as needed follow-up with her and annual follow-up at the survivorship clinic .    LABS: Most recent lab results in [facility] include: Lab Results  Component Value Date   WBC 4.0 07/25/2024   HGB 13.5 07/25/2024   HCT 39.9 07/25/2024   PLT 228.0 07/25/2024   GLUCOSE 116 (H) 07/25/2024   CHOL 150 07/25/2024   TRIG 117.0 07/25/2024   HDL 48.30 07/25/2024   LDLCALC 78 07/25/2024   ALT 16 07/25/2024   AST 17 07/25/2024   NA 135 07/25/2024   K 4.5 07/25/2024   CL 101 07/25/2024   CREATININE 1.59 (H) 07/25/2024   BUN 26 (H) 07/25/2024   CO2 26 07/25/2024   TSH 2.02 07/25/2024   PSA 2.31 07/25/2024   HGBA1C 6.2 07/25/2024    IMAGES: 1V PCXR 11/08/2023: IMPRESSION: Pulmonary vascular congestion and mild interstitial coarsening which may be due to edema or atypical infection.    EKG: EKG 11/10/2023 8:17 AM: Supraventricular tachycardia at 162 bpm  Right bundle branch block  Left anterior fasicular block  Abnormal ECG  When compared with ECG of 10-Nov-2023 04:13, PREVIOUS ECG IS PRESENT  Confirmed by Shlomo Corning ([id number]) on 11/10/2023 8:28:22 PM  EKG 11/08/2023 3:19 AM: Sinus rhythm Probable left atrial enlargement RBBB and LAFB Confirmed by Theadore Sharper [phone]) on 11/08/2023 5:15:34 AM   CV: TEE 11/11/2023: IMPRESSIONS   1. Left ventricular ejection fraction, by estimation, is 30 to 35%. The  left ventricle has moderately decreased function. The left ventricle  demonstrates global hypokinesis. The left ventricular internal cavity size  was mildly dilated. There is severe  hypokinesis of the left ventricular, basal-mid inferior wall and  inferolateral wall.   2. Right ventricular systolic function was not well visualized. The right  ventricular size is normal.   3.  Left atrial size was mildly dilated. No left atrial/left atrial  appendage thrombus was detected. The LAA emptying velocity was 29 cm/s.   4. Tethering of the P2 segment of the posterior leaflet. RVOl 33 mL, ERO  0.20 cm, MR radius 0.7 cm. The mitral valve is grossly normal. Moderate to  severe mitral valve regurgitation. No evidence of mitral stenosis.   5. The aortic valve is tricuspid. Aortic valve regurgitation is trivial.  No aortic stenosis is present.   6. 3D performed of the mitral valve and demonstrates demonstrates normal  valve architecture but the P2 segment of the posterior leaflet is  tethered.    RHC/LHC 11/10/2023:   Prox RCA lesion is 20% stenosed.   Mid RCA to Dist RCA lesion is 20% stenosed.   Ramus lesion is 20% stenosed.   2nd Diag lesion is 50% stenosed.   Mid LAD lesion is 20% stenosed.   Mild non-obstructive CAD Normal right heart pressures PCWP V wave 20 mm Hg, mean 15 mmHg LVEDP 20 mmHg   Recommendations: No obstructive CAD. His mitral regurgitation is not ischemia mediated. Continue planning for possible mitral valve surgery with TEE tomorrow.    TTE 11/08/2023: IMPRESSIONS   1. While there is global left ventricular hypokinesis, the inferior and  inferolateral walls have disproportionately severe hypokinesis, suggesting  right  coronary or left circumflex artery ischemia. There is posterior  mitral leaflet tethering. Left  ventricular ejection fraction, by estimation, is 30 to 35%. The left  ventricle has moderately decreased function. The left ventricle  demonstrates global hypokinesis. The left ventricular internal cavity size  was mildly dilated. Left ventricular diastolic   parameters are consistent with Grade II diastolic dysfunction  (pseudonormalization). Elevated left atrial pressure.   2. Right ventricular systolic function is low normal. The right  ventricular size is normal. There is mildly elevated pulmonary artery  systolic pressure. The  estimated right ventricular systolic pressure is  36.5 mmHg.   3. Left atrial size was mildly dilated.   4. There is posterior mitral leaflet tethering. The regurgitant jet  originates primarily at the A2-P2 commisureBy the PISA method, the  effective regurgitant orifice area is 0.27 cm sq, regurgitant volume 35  ml, regurgitant fraction 45%. MR vena  contracta is 5 mm. The mitral valve area by planimetry is 6.25 cm sq. The  mitral valve is abnormal. Moderate to severe mitral valve regurgitation.  The mean mitral valve gradient is 2.0 mmHg with average heart rate of 82  bpm.   5. The aortic valve is tricuspid. Aortic valve regurgitation is trivial.   6. The inferior vena cava is dilated in size with >50% respiratory  variability, suggesting right atrial pressure of 8 mmHg.  - Comparison(s): A prior study was performed on 2013. Prior images unable to  be directly viewed, comparison made by report only. Mitral insufficiency  appears to be substantially worse and LVEF is lower. Overall impression  suggests coronary insufficiency  superimposed on preexisting nonischemiccardiomyopathy.  - Conclusion(s)/Recommendation(s): Consider TEE for MR evaluation. Consider  repeat ischemic evaluation.    Long term patch monitor 10/22/2023 - 10/28/2023:  Patient had a min HR of 62 bpm, max HR of 169 bpm, and avg HR of 82 bpm. Predominant underlying rhythm was Sinus Rhythm. Bundle Branch Block/IVCD was present. 1 run of Supraventricular Tachycardia occurred lasting 6 beats with a max rate of 169 bpm (avg  151 bpm). Isolated SVEs were rare (<1.0%), and no SVE Couplets or SVE Triplets were present. Isolated VEs were rare (<1.0%), and no VE Couplets or VE Triplets were present.    MRI Cardiac 10/07/2021: IMPRESSION: 1. Normal LV size and wall thickness, diffuse hypokinesis with EF 36%. 2.  Normal RV size with moderate hypokinesis, EF 35%. 3. Non-coronary somewhat subtle LGE pattern in the basal  septum. Mid-wall LGE most suggestive of prior myocarditis. Elevated extracellular volume percentage in the basal septum suggestive of fibrosis. Basal septal T2 not elevated, doubt active inflammation.    Past Medical History:  Diagnosis Date   Acute myeloid leukemia in remission (HCC) 1999   Anemia    Arthritis    CHF (congestive heart failure) (HCC)    Coronary artery disease    mild non-obstructive CAD 11/2023   Depression    Diverticulosis of colon (without mention of hemorrhage) 04/21/2011   Dysrhythmia    SVT 11/10/2023   Esophageal reflux    Family hx colonic polyps    Family hx colonic polyps 04/21/2011   Hiatal hernia    History of kidney stones    Hyperlipemia    Hypertension    MDS (myelodysplastic syndrome) (HCC)    Mitral regurgitation    moderate-severe MR 11/2023   NICM (nonischemic cardiomyopathy) (HCC)    Psychosexual dysfunction with inhibited sexual excitement     Past Surgical History:  Procedure Laterality Date  EAR EXAMINATION UNDER ANESTHESIA Right 10/29/2021   middle ear cleaned out infection, with an inplanted ear tube   KNEE SURGERY Left 08/14/2021   [facility]   KNEE SURGERY Right    RIGHT/LEFT HEART CATH AND CORONARY ANGIOGRAPHY N/A 11/10/2023   Procedure: RIGHT/LEFT HEART CATH AND CORONARY ANGIOGRAPHY;  Surgeon: Verlin Lonni BIRCH, MD;  Location: [facility] INVASIVE CV LAB;  Service: Cardiovascular;  Laterality: N/A;   SHOULDER SURGERY     x 3 - 2 on left, 1 on right   TRANSESOPHAGEAL ECHOCARDIOGRAM (CATH LAB) N/A 11/11/2023   Procedure: TRANSESOPHAGEAL ECHOCARDIOGRAM;  Surgeon: Mona Vinie BROCKS, MD;  Location: [facility] INVASIVE CV LAB;  Service: Cardiovascular;  Laterality: N/A;    MEDICATIONS:  cetirizine  (ZYRTEC ) 10 MG tablet   famotidine  (PEPCID ) 20 MG tablet   fluticasone  (FLONASE ) 50 MCG/ACT nasal spray   furosemide  (LASIX ) 20 MG tablet   JARDIANCE  10 MG TABS tablet   metoprolol  succinate (TOPROL -XL) 25 MG 24 hr tablet   omeprazole   (PRILOSEC  OTC) 20 MG tablet   pantoprazole  (PROTONIX ) 40 MG tablet   pravastatin  (PRAVACHOL ) 20 MG tablet   sacubitril -valsartan  (ENTRESTO ) 24-26 MG   sildenafil  (VIAGRA ) 100 MG tablet   tadalafil  (CIALIS ) 5 MG tablet   tetrahydrozoline 0.05 % ophthalmic solution   traZODone  (DESYREL ) 50 MG tablet   triamcinolone  ointment (KENALOG) 0.1 %   Ascorbic Acid (VITAMIN C) 1000 MG tablet   azithromycin  (ZITHROMAX ) 250 MG tablet   cholecalciferol (VITAMIN D3) 25 MCG (1000 UNIT) tablet   cyanocobalamin  (VITAMIN B12) 1000 MCG tablet   EPIPEN 2-PAK 0.3 MG/0.3ML DEVI   Nutritional Supplements (JUICE PLUS FIBRE PO)   predniSONE  (DELTASONE ) 20 MG tablet   valACYclovir  (VALTREX ) 500 MG tablet   By current medication list he is not currently on azithromycin , vitamin C, vitamin D3, vitamin B12, Juice Plus fiber, prednisone .  Isaiah Ruder, PA-C Surgical Short Stay/Anesthesiology [facility] Phone [phone] [facility] Phone [phone] 08/10/2024 1:12 PM

## 2024-08-10 NOTE — Addendum Note (Signed)
 Addended by: THURMON ARLAND PARAS on: 08/10/2024 11:23 AM   Modules accepted: Orders

## 2024-08-10 NOTE — Anesthesia Preprocedure Evaluation (Addendum)
"                                    Anesthesia Evaluation  Patient identified by MRN, date of birth, ID band Patient awake    Reviewed: Allergy & Precautions, H&P , NPO status , Patient's Chart, lab work & pertinent test results  History of Anesthesia Complications Negative for: history of anesthetic complications  Airway Mallampati: II  TM Distance: >3 FB Neck ROM: full    Dental no notable dental hx.    Pulmonary neg pulmonary ROS   breath sounds clear to auscultation       Cardiovascular hypertension, + CAD and +CHF  + dysrhythmias  Rhythm:regular Rate:Normal  TTE 11/11/23: EF 30-35%, global hypokinesis, mild LVE, severe hypokinesis of the left ventricular, basal-mid inferior wall and inferolateral wall, mild LAE, tethering of the P2 segment of the posterior leaflet. RVOl 33 mL, ERO 0.20 cm, MR radius 0.7 cm, moderate to severe MR     Neuro/Psych  PSYCHIATRIC DISORDERS Anxiety Depression     Neuromuscular disease    GI/Hepatic hiatal hernia,GERD  ,,  Endo/Other    Renal/GU      Musculoskeletal  (+) Arthritis ,    Abdominal   Peds  Hematology   Anesthesia Other Findings   Reproductive/Obstetrics                              Anesthesia Physical Anesthesia Plan  ASA: 3  Anesthesia Plan: MAC and Regional   Post-op Pain Management: Minimal or no pain anticipated   Induction: Intravenous  PONV Risk Score and Plan: 1 and Propofol  infusion, Treatment may vary due to age or medical condition and Midazolam   Airway Management Planned: Simple Face Mask and Natural Airway  Additional Equipment:   Intra-op Plan:   Post-operative Plan:   Informed Consent: I have reviewed the patients History and Physical, chart, labs and discussed the procedure including the risks, benefits and alternatives for the proposed anesthesia with the patient or authorized representative who has indicated his/her understanding and acceptance.      Dental advisory given  Plan Discussed with: CRNA, Anesthesiologist and Surgeon  Anesthesia Plan Comments: (See PAT note written 08/10/2024 by Isaiah Ruder, PA-C.  )         Anesthesia Quick Evaluation  "

## 2024-08-16 ENCOUNTER — Encounter (HOSPITAL_COMMUNITY)
Admission: RE | Disposition: A | Discharge status: Home / Self Care | Payer: Self-pay | Source: Home / Self Care | Attending: Orthopedic Surgery

## 2024-08-16 ENCOUNTER — Ambulatory Visit (HOSPITAL_COMMUNITY): Payer: Self-pay | Admitting: Vascular Surgery

## 2024-08-16 ENCOUNTER — Encounter (HOSPITAL_COMMUNITY): Payer: Self-pay | Admitting: Orthopedic Surgery

## 2024-08-16 ENCOUNTER — Encounter (HOSPITAL_COMMUNITY): Payer: Self-pay | Admitting: Vascular Surgery

## 2024-08-16 ENCOUNTER — Ambulatory Visit (HOSPITAL_COMMUNITY)

## 2024-08-16 ENCOUNTER — Other Ambulatory Visit: Payer: Self-pay

## 2024-08-16 ENCOUNTER — Ambulatory Visit (HOSPITAL_COMMUNITY)
Admission: RE | Admit: 2024-08-16 | Discharge: 2024-08-16 | Disposition: A | Discharge status: Home / Self Care | Attending: Orthopedic Surgery | Admitting: Orthopedic Surgery

## 2024-08-16 DIAGNOSIS — I471 Supraventricular tachycardia, unspecified: Secondary | ICD-10-CM | POA: Diagnosis not present

## 2024-08-16 DIAGNOSIS — C9201 Acute myeloblastic leukemia, in remission: Secondary | ICD-10-CM | POA: Insufficient documentation

## 2024-08-16 DIAGNOSIS — I2510 Atherosclerotic heart disease of native coronary artery without angina pectoris: Secondary | ICD-10-CM

## 2024-08-16 DIAGNOSIS — I428 Other cardiomyopathies: Secondary | ICD-10-CM | POA: Insufficient documentation

## 2024-08-16 DIAGNOSIS — I340 Nonrheumatic mitral (valve) insufficiency: Secondary | ICD-10-CM | POA: Insufficient documentation

## 2024-08-16 DIAGNOSIS — M1812 Unilateral primary osteoarthritis of first carpometacarpal joint, left hand: Secondary | ICD-10-CM | POA: Diagnosis present

## 2024-08-16 DIAGNOSIS — F419 Anxiety disorder, unspecified: Secondary | ICD-10-CM | POA: Diagnosis not present

## 2024-08-16 DIAGNOSIS — E785 Hyperlipidemia, unspecified: Secondary | ICD-10-CM | POA: Diagnosis not present

## 2024-08-16 DIAGNOSIS — I5022 Chronic systolic (congestive) heart failure: Secondary | ICD-10-CM | POA: Insufficient documentation

## 2024-08-16 DIAGNOSIS — K219 Gastro-esophageal reflux disease without esophagitis: Secondary | ICD-10-CM | POA: Insufficient documentation

## 2024-08-16 DIAGNOSIS — I5020 Unspecified systolic (congestive) heart failure: Secondary | ICD-10-CM

## 2024-08-16 DIAGNOSIS — I110 Hypertensive heart disease with heart failure: Secondary | ICD-10-CM

## 2024-08-16 DIAGNOSIS — K449 Diaphragmatic hernia without obstruction or gangrene: Secondary | ICD-10-CM | POA: Insufficient documentation

## 2024-08-16 DIAGNOSIS — F32A Depression, unspecified: Secondary | ICD-10-CM | POA: Diagnosis not present

## 2024-08-16 DIAGNOSIS — Z9484 Stem cells transplant status: Secondary | ICD-10-CM | POA: Insufficient documentation

## 2024-08-16 HISTORY — DX: Heart failure, unspecified: I50.9

## 2024-08-16 HISTORY — DX: Atherosclerotic heart disease of native coronary artery without angina pectoris: I25.10

## 2024-08-16 HISTORY — DX: Other cardiomyopathies: I42.8

## 2024-08-16 HISTORY — PX: CARPOMETACARPEL SUSPENSION PLASTY: SHX5005

## 2024-08-16 HISTORY — DX: Cardiac arrhythmia, unspecified: I49.9

## 2024-08-16 HISTORY — DX: Nonrheumatic mitral (valve) insufficiency: I34.0

## 2024-08-16 SURGERY — CARPOMETACARPEL (CMC) SUSPENSION PLASTY
Anesthesia: Monitor Anesthesia Care | Site: Hand | Laterality: Left

## 2024-08-16 MED ORDER — FENTANYL CITRATE (PF) 100 MCG/2ML IJ SOLN
INTRAMUSCULAR | Status: AC
  Administered 2024-08-16: 50 ug via INTRAVENOUS
  Filled 2024-08-16: qty 2

## 2024-08-16 MED ORDER — CEFAZOLIN SODIUM-DEXTROSE 2-4 GM/100ML-% IV SOLN
2.0000 g | INTRAVENOUS | Status: AC
  Administered 2024-08-16: 2 g via INTRAVENOUS
  Filled 2024-08-16: qty 100

## 2024-08-16 MED ORDER — MIDAZOLAM HCL 2 MG/2ML IJ SOLN
INTRAMUSCULAR | Status: AC
  Filled 2024-08-16: qty 2

## 2024-08-16 MED ORDER — OXYCODONE HCL 5 MG PO TABS: 5.0000 mg | ORAL_TABLET | Freq: Once | ORAL | Status: DC | PRN

## 2024-08-16 MED ORDER — LIDOCAINE HCL (CARDIAC) PF 100 MG/5ML IV SOSY
PREFILLED_SYRINGE | INTRAVENOUS | Status: DC | PRN
  Administered 2024-08-16: 20 mg via INTRAVENOUS

## 2024-08-16 MED ORDER — MIDAZOLAM HCL 2 MG/2ML IJ SOLN
INTRAMUSCULAR | Status: AC
  Administered 2024-08-16: 1 mg via INTRAVENOUS
  Filled 2024-08-16: qty 2

## 2024-08-16 MED ORDER — CHLORHEXIDINE GLUCONATE 0.12 % MT SOLN
15.0000 mL | Freq: Once | OROMUCOSAL | Status: AC
  Administered 2024-08-16: 15 mL via OROMUCOSAL
  Filled 2024-08-16: qty 15

## 2024-08-16 MED ORDER — PROPOFOL 10 MG/ML IV BOLUS
INTRAVENOUS | Status: DC | PRN
  Administered 2024-08-16: 10 mg via INTRAVENOUS
  Administered 2024-08-16: 40 mg via INTRAVENOUS
  Administered 2024-08-16: 20 mg via INTRAVENOUS
  Administered 2024-08-16: 10 mg via INTRAVENOUS

## 2024-08-16 MED ORDER — ONDANSETRON HCL 4 MG/2ML IJ SOLN: 4.0000 mg | Freq: Four times a day (QID) | INTRAMUSCULAR | Status: DC | PRN

## 2024-08-16 MED ORDER — PROPOFOL 500 MG/50ML IV EMUL
INTRAVENOUS | Status: DC | PRN
  Administered 2024-08-16: 50 ug/kg/min via INTRAVENOUS

## 2024-08-16 MED ORDER — FENTANYL CITRATE (PF) 100 MCG/2ML IJ SOLN
INTRAMUSCULAR | Status: DC | PRN
  Administered 2024-08-16: 50 ug via INTRAVENOUS

## 2024-08-16 MED ORDER — OXYCODONE HCL 5 MG/5ML PO SOLN: 5.0000 mg | Freq: Once | ORAL | Status: DC | PRN

## 2024-08-16 MED ORDER — LACTATED RINGERS IV SOLN: INTRAVENOUS | Status: DC

## 2024-08-16 MED ORDER — MIDAZOLAM HCL (PF) 2 MG/2ML IJ SOLN
INTRAMUSCULAR | Status: DC | PRN
  Administered 2024-08-16: 1 mg via INTRAVENOUS

## 2024-08-16 MED ORDER — BUPIVACAINE HCL (PF) 0.25 % IJ SOLN
INTRAMUSCULAR | Status: AC
  Filled 2024-08-16: qty 30

## 2024-08-16 MED ORDER — DEXAMETHASONE SOD PHOSPHATE PF 10 MG/ML IJ SOLN
INTRAMUSCULAR | Status: DC | PRN
  Administered 2024-08-16: 5 mg via INTRAVENOUS

## 2024-08-16 MED ORDER — PHENYLEPHRINE HCL-NACL 20-0.9 MG/250ML-% IV SOLN
INTRAVENOUS | Status: DC | PRN
  Administered 2024-08-16: 25 ug/min via INTRAVENOUS

## 2024-08-16 MED ORDER — SODIUM CHLORIDE 0.9 % IR SOLN
Status: DC | PRN
  Administered 2024-08-16: 1000 mL

## 2024-08-16 MED ORDER — ONDANSETRON HCL 4 MG/2ML IJ SOLN
INTRAMUSCULAR | Status: AC
  Filled 2024-08-16: qty 2

## 2024-08-16 MED ORDER — DEXAMETHASONE SOD PHOSPHATE PF 10 MG/ML IJ SOLN
INTRAMUSCULAR | Status: AC
  Filled 2024-08-16: qty 1

## 2024-08-16 MED ORDER — FENTANYL CITRATE (PF) 100 MCG/2ML IJ SOLN
INTRAMUSCULAR | Status: AC
  Filled 2024-08-16: qty 2

## 2024-08-16 MED ORDER — BUPIVACAINE-EPINEPHRINE (PF) 0.5% -1:200000 IJ SOLN
INTRAMUSCULAR | Status: DC | PRN
  Administered 2024-08-16: 25 mL via PERINEURAL

## 2024-08-16 MED ORDER — MIDAZOLAM HCL (PF) 2 MG/2ML IJ SOLN: 1.0000 mg | Freq: Once | INTRAMUSCULAR | Status: AC

## 2024-08-16 MED ORDER — FENTANYL CITRATE (PF) 100 MCG/2ML IJ SOLN: 50.0000 ug | Freq: Once | INTRAMUSCULAR | Status: AC

## 2024-08-16 MED ORDER — FENTANYL CITRATE (PF) 100 MCG/2ML IJ SOLN: 25.0000 ug | INTRAMUSCULAR | Status: DC | PRN

## 2024-08-16 MED ORDER — PROPOFOL 10 MG/ML IV BOLUS
INTRAVENOUS | Status: AC
  Filled 2024-08-16: qty 20

## 2024-08-16 MED ORDER — LIDOCAINE 2% (20 MG/ML) 5 ML SYRINGE
INTRAMUSCULAR | Status: AC
  Filled 2024-08-16: qty 5

## 2024-08-16 MED ORDER — ORAL CARE MOUTH RINSE: 15.0000 mL | Freq: Once | OROMUCOSAL | Status: AC

## 2024-08-16 MED ORDER — ONDANSETRON HCL 4 MG/2ML IJ SOLN
INTRAMUSCULAR | Status: DC | PRN
  Administered 2024-08-16: 4 mg via INTRAVENOUS

## 2024-08-16 SURGICAL SUPPLY — 43 items
ANCHOR FIBERLOCK SUSPENSION (Anchor) IMPLANT
BAG COUNTER SPONGE SURGICOUNT (BAG) ×1 IMPLANT
BNDG COMPR ESMARK 4X3 LF (GAUZE/BANDAGES/DRESSINGS) ×1 IMPLANT
BNDG ELASTIC 3INX 5YD STR LF (GAUZE/BANDAGES/DRESSINGS) ×1 IMPLANT
BNDG ELASTIC 4INX 5YD STR LF (GAUZE/BANDAGES/DRESSINGS) IMPLANT
BNDG GAUZE DERMACEA FLUFF 4 (GAUZE/BANDAGES/DRESSINGS) ×3 IMPLANT
CAP PIN ORTHO PINK (CAP) IMPLANT
CORD BIPOLAR FORCEPS 12FT (ELECTRODE) ×1 IMPLANT
COVER SURGICAL LIGHT HANDLE (MISCELLANEOUS) ×1 IMPLANT
CUFF TOURN SGL QUICK 18X4 (TOURNIQUET CUFF) ×1 IMPLANT
CUFF TRNQT CYL 24X4X16.5-23 (TOURNIQUET CUFF) IMPLANT
DRAPE OEC MINIVIEW 54X84 (DRAPES) IMPLANT
DRSG ADAPTIC 3X8 NADH LF (GAUZE/BANDAGES/DRESSINGS) IMPLANT
DRSG EMULSION OIL 3X3 NADH (GAUZE/BANDAGES/DRESSINGS) ×1 IMPLANT
DRSG XEROFORM 1X8 (GAUZE/BANDAGES/DRESSINGS) IMPLANT
FIBERLOOP 2 0 (SUTURE) IMPLANT
GAUZE SPONGE 4X4 12PLY STRL (GAUZE/BANDAGES/DRESSINGS) ×1 IMPLANT
GLOVE BIOGEL M 8.0 STRL (GLOVE) ×1 IMPLANT
GLOVE SS BIOGEL STRL SZ 8 (GLOVE) ×2 IMPLANT
GOWN STRL REUS W/ TWL LRG LVL3 (GOWN DISPOSABLE) ×3 IMPLANT
GOWN STRL REUS W/ TWL XL LVL3 (GOWN DISPOSABLE) ×2 IMPLANT
KIT BASIN OR (CUSTOM PROCEDURE TRAY) ×1 IMPLANT
KIT TURNOVER KIT B (KITS) ×1 IMPLANT
KWIRE DBL TROCAR .062X4 (WIRE) IMPLANT
NEEDLE HYPO 25GX1X1/2 BEV (NEEDLE) ×1 IMPLANT
PACK ORTHO EXTREMITY (CUSTOM PROCEDURE TRAY) ×1 IMPLANT
PAD ARMBOARD POSITIONER FOAM (MISCELLANEOUS) ×2 IMPLANT
PAD CAST 3X4 CTTN HI CHSV (CAST SUPPLIES) ×1 IMPLANT
PAD CAST 4YDX4 CTTN HI CHSV (CAST SUPPLIES) IMPLANT
PASSER SUT SWANSON 36MM LOOP (INSTRUMENTS) IMPLANT
SLING ARM IMMOBILIZER LRG (SOFTGOODS) IMPLANT
SOL PREP POV-IOD 4OZ 10% (MISCELLANEOUS) ×2 IMPLANT
SOLN 0.9% NACL POUR BTL 1000ML (IV SOLUTION) ×1 IMPLANT
SOLN STERILE WATER BTL 1000 ML (IV SOLUTION) ×1 IMPLANT
SUCTION TUBE FRAZIER 10FR DISP (SUCTIONS) IMPLANT
SUT PROLENE 4 0 PS 2 18 (SUTURE) ×1 IMPLANT
SUT VIC AB 3-0 FS2 27 (SUTURE) IMPLANT
SUTURE FIBERWR 3-0 18 TAPR NDL (SUTURE) IMPLANT
SYR CONTROL 10ML LL (SYRINGE) ×1 IMPLANT
TOWEL GREEN STERILE (TOWEL DISPOSABLE) ×1 IMPLANT
TOWEL GREEN STERILE FF (TOWEL DISPOSABLE) ×1 IMPLANT
TUBE CONNECTING 12X1/4 (SUCTIONS) IMPLANT
UNDERPAD 30X36 HEAVY ABSORB (UNDERPADS AND DIAPERS) ×1 IMPLANT

## 2024-08-16 NOTE — Anesthesia Postprocedure Evaluation (Signed)
"   Anesthesia Post Note  Patient: [name]  Procedure(s) Performed: LEFT THUMB CARPOMETACARPAL ARTHROPLASTY WITH PARALELL FIBER LOCK SUSPENSION AND TENDON TRANSFER AND REPAIR AS NECESSARY (Left: Hand)     Patient location during evaluation: PACU Anesthesia Type: Regional Level of consciousness: awake Pain management: pain level controlled Vital Signs Assessment: post-procedure vital signs reviewed and stable Respiratory status: spontaneous breathing Cardiovascular status: blood pressure returned to baseline Postop Assessment: no apparent nausea or vomiting Anesthetic complications: no   No notable events documented.  Last Vitals:  Vitals:   08/16/24 1915 08/16/24 1930  BP: 131/85 (!) 138/90  Pulse: (!) 55 (!) 56  Resp: 14 12  Temp:  (!) 36.4 C  SpO2: 97% 98%    Last Pain:  Vitals:   08/16/24 1900  TempSrc:   PainSc: 0-No pain                 Brendalee Matthies T Colhoun      "

## 2024-08-16 NOTE — Discharge Instructions (Signed)
Keep bandage clean and dry.  Call for any problems.  No smoking.  Criteria for driving a car: you should be off your pain medicine for 7-8 hours, able to drive one handed(confident), thinking clearly and feeling able in your judgement to drive. °Continue elevation as it will decrease swelling.  If instructed by MD move your fingers within the confines of the bandage/splint.  Use ice if instructed by your MD. Call immediately for any sudden loss of feeling in your hand/arm or change in functional abilities of the extremity.We recommend that you to take vitamin C 1000 mg a day to promote healing. °We also recommend that if you require  pain medicine that you take a stool softener to prevent constipation as most pain medicines will have constipation side effects. We recommend either Peri-Colace or Senokot and recommend that you also consider adding MiraLAX as well to prevent the constipation affects from pain medicine if you are required to use them. These medicines are over the counter and may be purchased at a local pharmacy. A cup of yogurt and a probiotic can also be helpful during the recovery process as the medicines can disrupt your intestinal environment. ° °

## 2024-08-16 NOTE — H&P (Signed)
 [name] is an 69 y.o. male.   Chief Complaint: left thumb pain HPI: Patient presents for left thumb [facility]  Patient presents for evaluation and treatment of the of their upper extremity predicament. The patient denies neck, back, chest or  abdominal pain. The patient notes that they have no lower extremity problems. The patients primary complaint is noted. We are planning surgical care pathway for the upper extremity.   Past Medical History:  Diagnosis Date   Acute myeloid leukemia in remission (HCC) 1999   Anemia    Arthritis    CHF (congestive heart failure) (HCC)    Coronary artery disease    mild non-obstructive CAD 11/2023   Depression    Diverticulosis of colon (without mention of hemorrhage) 04/21/2011   Dysrhythmia    SVT 11/10/2023   Esophageal reflux    Family hx colonic polyps    Family hx colonic polyps 04/21/2011   Hiatal hernia    History of kidney stones    Hyperlipemia    Hypertension    MDS (myelodysplastic syndrome) (HCC)    Mitral regurgitation    moderate-severe MR 11/2023   NICM (nonischemic cardiomyopathy) (HCC)    Psychosexual dysfunction with inhibited sexual excitement     Past Surgical History:  Procedure Laterality Date   EAR EXAMINATION UNDER ANESTHESIA Right 10/29/2021   middle ear cleaned out infection, with an inplanted ear tube   KNEE SURGERY Left 08/14/2021   [facility]   KNEE SURGERY Right    RIGHT/LEFT HEART CATH AND CORONARY ANGIOGRAPHY N/A 11/10/2023   Procedure: RIGHT/LEFT HEART CATH AND CORONARY ANGIOGRAPHY;  Surgeon: Verlin Lonni BIRCH, MD;  Location: [facility] INVASIVE CV LAB;  Service: Cardiovascular;  Laterality: N/A;   SHOULDER SURGERY     x 3 - 2 on left, 1 on right   TRANSESOPHAGEAL ECHOCARDIOGRAM (CATH LAB) N/A 11/11/2023   Procedure: TRANSESOPHAGEAL ECHOCARDIOGRAM;  Surgeon: Mona Vinie BROCKS, MD;  Location: [facility] INVASIVE CV LAB;  Service: Cardiovascular;  Laterality: N/A;    Family History  Problem Relation Age of  Onset   Hypertension Father    Heart disease Father    Prostate cancer Father        dx late 17s   Cancer Father 31       prostate ca   Celiac disease Mother    Colon polyps Mother    Breast cancer Mother    Cancer Mother        Bladder   Colon cancer Neg Hx    Esophageal cancer Neg Hx    Inflammatory bowel disease Neg Hx    Liver disease Neg Hx    Pancreatic cancer Neg Hx    Rectal cancer Neg Hx    Stomach cancer Neg Hx    Social History:  reports that he has never smoked. He has never used smokeless tobacco. He reports current alcohol use. He reports that he does not use drugs.  Allergies: Allergies[1]  Medications Prior to Admission  Medication Sig Dispense Refill   buPROPion  (WELLBUTRIN  XL) 150 MG 24 hr tablet Take 1 tablet (150 mg total) by mouth daily. 90 tablet 0   cetirizine  (ZYRTEC ) 10 MG tablet Take 10 mg by mouth daily as needed for allergies.     famotidine  (PEPCID ) 20 MG tablet Take 20 mg by mouth at bedtime.     fluticasone  (FLONASE ) 50 MCG/ACT nasal spray Place 1 spray into both nostrils daily.     furosemide  (LASIX ) 20 MG tablet TAKE 1  TABLET BY MOUTH DAILY **IN CASE OF WEIGHT GAIN 2 TO 3 LBS IN 24 HOURS OR 5 LBS IN SEVEN DAYS, TAKE 2 TABLETS UNTIL WEIGHT BACK TO BASELINE** (Patient taking differently: Take 10 mg by mouth every other day.) 90 tablet 3   JARDIANCE  10 MG TABS tablet TAKE 1 TABLET BY MOUTH DAILY 90 tablet 2   metoprolol  succinate (TOPROL -XL) 25 MG 24 hr tablet Take 1.5 tablets (37.5 mg total) by mouth daily. 135 tablet 2   omeprazole  (PRILOSEC  OTC) 20 MG tablet Take 20 mg by mouth at bedtime.     pantoprazole  (PROTONIX ) 40 MG tablet Take 1 tablet (40 mg total) by mouth every morning. 90 tablet 3   pravastatin  (PRAVACHOL ) 20 MG tablet TAKE 1 TABLET BY MOUTH DAILY 90 tablet 2   sacubitril -valsartan  (ENTRESTO ) 24-26 MG Take 1 tablet by mouth 2 (two) times daily. 180 tablet 3   sildenafil  (VIAGRA ) 100 MG tablet TAKE 1/2 TABLET BY MOUTH DAILY AS NEEDED  FOR FOR ERECTILE DYSFUNCTION (Patient taking differently: Take 100 mg by mouth daily as needed for erectile dysfunction.) 30 tablet 1   tadalafil  (CIALIS ) 5 MG tablet Take 5 mg by mouth daily.     tetrahydrozoline 0.05 % ophthalmic solution Place 1 drop into both eyes in the morning.     traZODone  (DESYREL ) 50 MG tablet Take 1 tablet (50 mg total) by mouth at bedtime. 90 tablet 3   triamcinolone  ointment (KENALOG) 0.1 % Apply 1 Application topically as needed.     Ascorbic Acid (VITAMIN C) 1000 MG tablet Take 1,000 mg by mouth daily. (Patient not taking: Reported on 08/09/2024)     azithromycin  (ZITHROMAX ) 250 MG tablet Take 2 tabs day 1, then 1 tab daily (Patient not taking: Reported on 08/09/2024) 6 each 0   cholecalciferol (VITAMIN D3) 25 MCG (1000 UNIT) tablet Take 1,000 Units by mouth daily. (Patient not taking: Reported on 08/09/2024)     cyanocobalamin  (VITAMIN B12) 1000 MCG tablet Take 1,000 mcg by mouth daily. (Patient not taking: Reported on 08/09/2024)     EPIPEN  2-PAK 0.3 MG/0.3ML DEVI Inject 0.3 mg into the muscle as needed for anaphylaxis.     Nutritional Supplements (JUICE PLUS FIBRE PO) Take 2 tablets by mouth 2 (two) times daily. 1 fruit and 1 vegetable in the AM 1 fruit and 1 vegetable in the PM (Patient not taking: Reported on 08/09/2024)     predniSONE  (DELTASONE ) 20 MG tablet Take 1 tablet (20 mg total) by mouth daily with breakfast. (Patient not taking: Reported on 08/09/2024) 5 tablet 0   valACYclovir  (VALTREX ) 500 MG tablet Take 1 tablet (500 mg total) by mouth 2 (two) times daily. As needed for flareups (Patient taking differently: Take 500 mg by mouth daily as needed (cold sore). As needed for flareups) 90 tablet 3    No results found for this or any previous visit (from the past 48 hours). No results found.  Review of Systems  Blood pressure (!) 143/85, pulse 62, temperature 97.8 F (36.6 C), temperature source Oral, resp. rate 19, height 5' 10 (1.778 m), weight 77.1  kg, SpO2 98%. Physical Exam left thumb OA with pain and loss of a motion  The patient is alert and oriented in no acute distress. The patient complains of pain in the affected upper extremity.  The patient is noted to have a normal HEENT exam. Lung fields show equal chest expansion and no shortness of breath. Abdomen exam is nontender without distention. Lower extremity examination does  not show any fracture dislocation or blood clot symptoms. Pelvis is stable and the neck and back are stable and nontender.   Assessment/Plan We are planning surgery for your upper extremity. The risk and benefits of surgery to include risk of bleeding, infection, anesthesia,  damage to normal structures and failure of the surgery to accomplish its intended goals of relieving symptoms and restoring function have been discussed in detail. With this in mind we plan to proceed. I have specifically discussed with the patient the pre-and postoperative regime and the dos and don'ts and risk and benefits in great detail. Risk and benefits of surgery also include risk of dystrophy(CRPS), chronic nerve pain, failure of the healing process to go onto completion and other inherent risks of surgery The relavent the pathophysiology of the disease/injury process, as well as the alternatives for treatment and postoperative course of action has been discussed in great detail with the patient who desires to proceed.  We will do everything in our power to help you (the patient) restore function to the upper extremity. It is a pleasure to see this patient today.   Plan left thumb [facility]  Elsie CHRISTELLA Mussel III, MD 08/16/2024, 3:17 PM       [1]  Allergies Allergen Reactions   Morphine Other (See Comments)    UNKNOWN - REACTION IS NOT LISTED Makes me crazy   Marinol [Dronabinol]     Delusions.

## 2024-08-16 NOTE — Anesthesia Procedure Notes (Signed)
 Anesthesia Regional Block: Supraclavicular block   Pre-Anesthetic Checklist: , timeout performed,  Correct Patient, Correct Site, Correct Laterality,  Correct Procedure, Correct Position, site marked,  Risks and benefits discussed,  Pre-op evaluation,  At surgeon's request and post-op pain management  Laterality: Left  Prep: Maximum Sterile Barrier Precautions used, chloraprep       Needles:  Injection technique: Single-shot  Needle Type: Echogenic Stimulator Needle     Needle Length: 9cm  Needle Gauge: 22     Additional Needles:   Procedures:,,,, ultrasound used (permanent image in chart),,    Narrative:  Start time: 08/16/2024 3:48 PM End time: 08/16/2024 3:51 PM Injection made incrementally with aspirations every 5 mL.  Performed by: Personally  Anesthesiologist: Paul Lamarr BRAVO, MD  Additional Notes: Risks, benefits, and alternative discussed. Patient gave consent for procedure. Patient prepped and draped in sterile fashion. Sedation administered, patient remains easily responsive to voice. Relevant anatomy identified with ultrasound guidance. Local anesthetic given in 5cc increments with no signs or symptoms of intravascular injection. No pain or paraesthesias with injection. Patient monitored throughout procedure with no signs of LAST or immediate complications. Tolerated well. Ultrasound image placed in chart.  LANEY Paul, MD

## 2024-08-16 NOTE — Op Note (Signed)
 Operative Note 08/16/2024  Date of dictation and operation 08/16/2024  Elsie Mussel MD  Pre op Dx- Left thumb Carpometacarpal Arthritis end stage with collapse  Post op Dx- Same   Operative Procedure- 1.Left thumb Carpometacarpal Arthroplasty ( removal of the trapezium at the basilar thumb joint level) 2. Left thumb Parallel fiberlock suspension 3.  Abductor pollicis longus one third proper portion tendon transfer to the FCR and APL and back upon themselves with multiple figure-of-eight throws (Weilby tendon transfer ) tendon transfer. 4.  Abductor pollicis longus tenodesis (shortening of her wrist extensor at the wrist forearm level) for purposes of prevention of dorsal lateral escape 5.  5 view radiographic series left basilar thumb joint  Beryl Balz MD  Block with IV sedation  Estimated blood loss minimal  Tourniquet time less than an hour  Description of the procedure in detail patient was seen by myself and anesthesia he was given a block in the preop holding area then underwent a very careful and cautious approach to the extremity with Betadine scrub and paint after a Hibiclens  prescrub all performed by myself timeout was observed preoperative antibiotics were given and the patient underwent tourniquet insufflation.  Curvilinear incision was made about the basilar thumb joint left thumb.  Dissection was carried down EPB and APL were released about the tendon sheath.  Radial artery was dissected free from adhesions at the basilar thumb joint and the superficial radial nerve was carefully retracted.  Following this the patient then underwent a very careful and cautious approach to the extremity with incising the [facility] joint and removal of the trapezium.  FCR tenolysis tenosynovectomy was accomplished and this completed the arthroplasty portion of the procedure.  This was a trapeziectomy in its entirety.  The scaphoid and trapezoid joint looked very good.  At this time I placed 2 drill holes 1  from dorsal to intra-articular in the palmar beak region and 1 in the articular region exiting about the thenar musculature.  These were made with 0.062 K wires.  Thus 2.062 K wires were placed from the intra-articular region of the metacarpal and exited dorsally and 1 radially.  These were later used for channeling the fiber tapes from the a fiber lock.  This time I performed placement of a fiber lock suspension anchor.  The suture tapes were then very carefully moved through the drill holes.  The dorsal fiber tape was then swung against the bone and the second fiber tape was identified and connected with the first fiber tape.  This allowed for a parallel suspension tied over a bony bridge about the metacarpal.  This was done without difficulty the thumb inset nicely with the suspension plasty.  This was a nice parallel suspension and fiber lock hammock for the base.  This was stress tested and looked excellent.  Following this I performed APL one third proper portion harvest about the distal third of the forearm.  A small incision was made to do so and was later closed with Prolene.  Fiber loop was placed around this tendon and following this the APL one third proper portion was placed around the APL proper and the FCR and back upon themselves with multiple figure-of-eight throws.  This completed the [facility] tendon transfer.  There were no complicating features.  Following this the patient underwent an APL tenodesis with FiberWire.  This was a shortening of her wrist extensor at the wrist from level to prevent dorsal lateral escape.  Following this final view x-ray was taken 5  the radiograph was taken and looked excellent.  There were no complicating features.  Area was irrigated all wounds closed with Prolene and a thumb spica splint was placed.  This completed the surgical endeavors.  They have my cell phone for any problems.  The patient did well with surgical approach today and there were no  complicating features.  All sponge needle and isthmic counts were reported as correct.  Elsie Mussel MD

## 2024-08-16 NOTE — Transfer of Care (Signed)
 Immediate Anesthesia Transfer of Care Note  Patient: [name]  Procedure(s) Performed: LEFT THUMB CARPOMETACARPAL ARTHROPLASTY WITH PARALELL FIBER LOCK SUSPENSION AND TENDON TRANSFER AND REPAIR AS NECESSARY (Left: Hand)  Patient Location: PACU  Anesthesia Type:MAC combined with regional for post-op pain  Level of Consciousness: awake, alert , and oriented  Airway & Oxygen Therapy: Patient Spontanous Breathing  Post-op Assessment: Report given to RN and Post -op Vital signs reviewed and stable  Post vital signs: Reviewed and stable  Last Vitals:  Vitals Value Taken Time  BP 109/80 08/16/24 18:53  Temp 98   Pulse 58 08/16/24 18:56  Resp 14 08/16/24 18:56  SpO2 98 % 08/16/24 18:56  Vitals shown include unfiled device data.  Last Pain:  Vitals:   08/16/24 1555  TempSrc:   PainSc: 0-No pain         Complications: No notable events documented.

## 2024-08-17 ENCOUNTER — Encounter (HOSPITAL_COMMUNITY): Payer: Self-pay | Admitting: Orthopedic Surgery

## 2024-09-06 ENCOUNTER — Telehealth: Admitting: Family Medicine

## 2024-09-06 ENCOUNTER — Ambulatory Visit: Admitting: Family Medicine

## 2024-09-06 VITALS — Ht 70.0 in | Wt 170.0 lb

## 2024-09-06 DIAGNOSIS — B009 Herpesviral infection, unspecified: Secondary | ICD-10-CM | POA: Diagnosis not present

## 2024-09-06 DIAGNOSIS — F419 Anxiety disorder, unspecified: Secondary | ICD-10-CM

## 2024-09-06 DIAGNOSIS — G4700 Insomnia, unspecified: Secondary | ICD-10-CM | POA: Diagnosis not present

## 2024-09-06 DIAGNOSIS — R739 Hyperglycemia, unspecified: Secondary | ICD-10-CM

## 2024-09-06 NOTE — Assessment & Plan Note (Signed)
 He could not tolerate trazodone  due to side effects. We switched him to Wellbutrin  a few weeks ago to help mitigate some sexual side effects of the Zoloft . He did have some mild headaches initially but this is doing better now. He would like to continue with current treatment plan Zoloft  50 mg daily and Wellbutrin  150 mg daily. He can follow up with us  ina few weeks.

## 2024-09-06 NOTE — Assessment & Plan Note (Signed)
 Patient with recent HSV outbreak on nose. Now being treated with valtrex  per urgent care. Symptoms are improving. We did discuss valtrex  use as needed for future flare ups however with continue with watchful waiting for now.

## 2024-09-06 NOTE — Assessment & Plan Note (Signed)
 HE will come back in a few months to recheck A1c.

## 2024-09-06 NOTE — Progress Notes (Signed)
" ° °  [name] is a 68 y.o. male who presents today for a virtual office visit.  Assessment/Plan:   Chronic Problems Addressed Today: HSV-1 (herpes simplex virus 1) infection Patient with recent HSV outbreak on nose. Now being treated with valtrex  per urgent care. Symptoms are improving. We did discuss valtrex  use as needed for future flare ups however with continue with watchful waiting for now.   Anxiety He could not tolerate trazodone  due to side effects. We switched him to Wellbutrin  a few weeks ago to help mitigate some sexual side effects of the Zoloft . He did have some mild headaches initially but this is doing better now. He would like to continue with current treatment plan Zoloft  50 mg daily and Wellbutrin  150 mg daily. He can follow up with us  ina few weeks.   Hyperglycemia HE will come back in a few months to recheck A1c.     Subjective:  HPI:  See Assessment / plan for status of chronic conditions.   Discussed the use of AI scribe software for clinical note transcription with the patient, who gave verbal consent to proceed.  History of Present Illness [name] is a 69 year old male who presents with a persistent nasal infection.  He has been experiencing a nasal infection for about four weeks, initially suspected to be a staph infection. It was treated with mupirocin without resolution. A subsequent visit to urgent care led to a nasal swab and culture, identifying the infection as HSV-1, commonly known as the fever blister virus. The infection is located on the edge of his nose. He started treatment with acyclovir , which has begun to improve his symptoms. He has a history of using valacyclovir  for fever blisters on his lips, typically triggered by sun exposure, and self-medicates with Abreva during outbreaks. This is the first occurrence of the virus in his nose.  He is managing his mental health with Wellbutrin  after transitioning from Zoloft  and  trazodone . Initially, he experienced headaches, which have since resolved. He reports improved tolerance to Wellbutrin  and is focusing on cognitive strategies and increased physical activity to manage sleep disturbances. He notes that Zoloft  previously affected his sexual function, which has improved since switching medications.  He mentions a recent [facility] surgery on his hand and is currently in recovery.         Objective/Observations  Physical Exam: Gen: NAD, resting comfortably Pulm: Normal work of breathing Neuro: Grossly normal, moves all extremities Psych: Normal affect and thought content  Virtual Visit via Video   I connected with [name] on 09/06/24 at  1:20 PM EST by a video enabled telemedicine application and verified that I am speaking with the correct person using two identifiers. The limitations of evaluation and management by telemedicine and the availability of in person appointments were discussed. The patient expressed understanding and agreed to proceed.   Patient location: Home Provider location: Home Office Persons participating in the virtual visit: Myself and Patient     Worth HERO. Kennyth, MD 09/06/2024 12:22 PM  "

## 2024-09-06 NOTE — Assessment & Plan Note (Deleted)
 He could not tolerate trazodone  due to side effects. We switched him to Wellbutrin  a few weeks ago to help mitigate some sexual side effects of the Zoloft . He did have some mild headaches initially but this is doing better now. He would like to continue with current treatment plan Zoloft  50 mg daily and Wellbutrin  150 mg daily. He can follow up with us  ina few weeks.

## 2024-09-26 ENCOUNTER — Ambulatory Visit: Admitting: Cardiovascular Disease

## 2024-11-21 ENCOUNTER — Other Ambulatory Visit (HOSPITAL_COMMUNITY)

## 2025-07-03 ENCOUNTER — Ambulatory Visit
